# Patient Record
Sex: Female | Born: 1950 | Race: White | Hispanic: No | Marital: Married | Smoking: Former smoker
Health system: Southern US, Community
[De-identification: ages and names within clinical notes are randomized; demographics above are authoritative.]

## PROBLEM LIST (undated history)

## (undated) DIAGNOSIS — N3030 Trigonitis without hematuria: Secondary | ICD-10-CM

## (undated) DIAGNOSIS — E039 Hypothyroidism, unspecified: Secondary | ICD-10-CM

## (undated) DIAGNOSIS — I480 Paroxysmal atrial fibrillation: Secondary | ICD-10-CM

## (undated) DIAGNOSIS — K219 Gastro-esophageal reflux disease without esophagitis: Secondary | ICD-10-CM

## (undated) DIAGNOSIS — M545 Low back pain, unspecified: Secondary | ICD-10-CM

## (undated) DIAGNOSIS — Z8489 Family history of other specified conditions: Secondary | ICD-10-CM

## (undated) DIAGNOSIS — E785 Hyperlipidemia, unspecified: Secondary | ICD-10-CM

## (undated) DIAGNOSIS — M8580 Other specified disorders of bone density and structure, unspecified site: Secondary | ICD-10-CM

## (undated) DIAGNOSIS — E7800 Pure hypercholesterolemia, unspecified: Secondary | ICD-10-CM

## (undated) DIAGNOSIS — M1990 Unspecified osteoarthritis, unspecified site: Secondary | ICD-10-CM

## (undated) DIAGNOSIS — IMO0002 Reserved for concepts with insufficient information to code with codable children: Secondary | ICD-10-CM

## (undated) DIAGNOSIS — K449 Diaphragmatic hernia without obstruction or gangrene: Secondary | ICD-10-CM

## (undated) DIAGNOSIS — I700 Atherosclerosis of aorta: Secondary | ICD-10-CM

## (undated) DIAGNOSIS — G8929 Other chronic pain: Secondary | ICD-10-CM

## (undated) DIAGNOSIS — K222 Esophageal obstruction: Secondary | ICD-10-CM

## (undated) HISTORY — PX: BREAST CYST ASPIRATION: SHX578

## (undated) HISTORY — DX: Esophageal obstruction: K22.2

## (undated) HISTORY — PX: HYSTEROSCOPY: SHX211

## (undated) HISTORY — DX: Trigonitis without hematuria: N30.30

## (undated) HISTORY — DX: Atherosclerosis of aorta: I70.0

## (undated) HISTORY — DX: Other specified disorders of bone density and structure, unspecified site: M85.80

## (undated) HISTORY — DX: Paroxysmal atrial fibrillation: I48.0

## (undated) HISTORY — DX: Hyperlipidemia, unspecified: E78.5

## (undated) HISTORY — DX: Diaphragmatic hernia without obstruction or gangrene: K44.9

## (undated) HISTORY — DX: Gastro-esophageal reflux disease without esophagitis: K21.9

## (undated) HISTORY — DX: Reserved for concepts with insufficient information to code with codable children: IMO0002

## (undated) HISTORY — PX: COLONOSCOPY WITH ESOPHAGOGASTRODUODENOSCOPY (EGD): SHX5779

## (undated) HISTORY — PX: HERNIA REPAIR: SHX51

---

## 1970-06-26 HISTORY — PX: VARICOSE VEIN SURGERY: SHX832

## 1974-06-26 HISTORY — PX: THYROIDECTOMY: SHX17

## 1974-06-26 HISTORY — PX: TUBAL LIGATION: SHX77

## 1990-06-26 HISTORY — PX: EYE MUSCLE SURGERY: SHX370

## 1994-06-26 HISTORY — PX: DILATION AND CURETTAGE OF UTERUS: SHX78

## 1995-06-27 HISTORY — PX: BREAST BIOPSY: SHX20

## 1997-10-15 ENCOUNTER — Other Ambulatory Visit: Admission: RE | Admit: 1997-10-15 | Discharge: 1997-10-15 | Payer: Self-pay | Admitting: Gynecology

## 1997-12-09 ENCOUNTER — Ambulatory Visit (HOSPITAL_COMMUNITY): Admission: RE | Admit: 1997-12-09 | Discharge: 1997-12-09 | Payer: Self-pay | Admitting: Gynecology

## 1998-09-24 ENCOUNTER — Ambulatory Visit (HOSPITAL_COMMUNITY): Admission: RE | Admit: 1998-09-24 | Discharge: 1998-09-24 | Payer: Self-pay | Admitting: Gynecology

## 1998-09-24 ENCOUNTER — Encounter: Payer: Self-pay | Admitting: Gynecology

## 1998-10-18 ENCOUNTER — Other Ambulatory Visit: Admission: RE | Admit: 1998-10-18 | Discharge: 1998-10-18 | Payer: Self-pay | Admitting: Gynecology

## 1998-11-15 ENCOUNTER — Ambulatory Visit (HOSPITAL_BASED_OUTPATIENT_CLINIC_OR_DEPARTMENT_OTHER): Admission: RE | Admit: 1998-11-15 | Discharge: 1998-11-15 | Payer: Self-pay | Admitting: Surgery

## 1999-10-21 ENCOUNTER — Encounter: Payer: Self-pay | Admitting: Gynecology

## 1999-10-21 ENCOUNTER — Ambulatory Visit (HOSPITAL_COMMUNITY): Admission: RE | Admit: 1999-10-21 | Discharge: 1999-10-21 | Payer: Self-pay | Admitting: Gynecology

## 2000-03-02 ENCOUNTER — Other Ambulatory Visit: Admission: RE | Admit: 2000-03-02 | Discharge: 2000-03-02 | Payer: Self-pay | Admitting: Gynecology

## 2000-04-26 HISTORY — PX: OOPHORECTOMY: SHX86

## 2000-05-02 ENCOUNTER — Encounter (INDEPENDENT_AMBULATORY_CARE_PROVIDER_SITE_OTHER): Payer: Self-pay | Admitting: Specialist

## 2000-05-02 ENCOUNTER — Encounter (INDEPENDENT_AMBULATORY_CARE_PROVIDER_SITE_OTHER): Payer: Self-pay

## 2000-05-02 ENCOUNTER — Ambulatory Visit (HOSPITAL_COMMUNITY): Admission: RE | Admit: 2000-05-02 | Discharge: 2000-05-02 | Payer: Self-pay | Admitting: Gynecology

## 2000-11-02 ENCOUNTER — Encounter: Payer: Self-pay | Admitting: Gynecology

## 2000-11-02 ENCOUNTER — Ambulatory Visit (HOSPITAL_COMMUNITY): Admission: RE | Admit: 2000-11-02 | Discharge: 2000-11-02 | Payer: Self-pay | Admitting: Gynecology

## 2001-09-06 ENCOUNTER — Other Ambulatory Visit: Admission: RE | Admit: 2001-09-06 | Discharge: 2001-09-06 | Payer: Self-pay | Admitting: Gynecology

## 2001-12-13 ENCOUNTER — Ambulatory Visit (HOSPITAL_COMMUNITY): Admission: RE | Admit: 2001-12-13 | Discharge: 2001-12-13 | Payer: Self-pay | Admitting: Gynecology

## 2001-12-13 ENCOUNTER — Ambulatory Visit (HOSPITAL_COMMUNITY): Admission: RE | Admit: 2001-12-13 | Discharge: 2001-12-13 | Payer: Self-pay | Admitting: Obstetrics and Gynecology

## 2001-12-13 ENCOUNTER — Encounter: Payer: Self-pay | Admitting: Gynecology

## 2002-04-25 ENCOUNTER — Other Ambulatory Visit: Admission: RE | Admit: 2002-04-25 | Discharge: 2002-04-25 | Payer: Self-pay | Admitting: Gynecology

## 2002-10-10 ENCOUNTER — Other Ambulatory Visit: Admission: RE | Admit: 2002-10-10 | Discharge: 2002-10-10 | Payer: Self-pay | Admitting: Gynecology

## 2003-03-06 ENCOUNTER — Ambulatory Visit (HOSPITAL_COMMUNITY): Admission: RE | Admit: 2003-03-06 | Discharge: 2003-03-06 | Payer: Self-pay | Admitting: Gynecology

## 2003-03-06 ENCOUNTER — Encounter: Payer: Self-pay | Admitting: Gynecology

## 2004-02-19 ENCOUNTER — Other Ambulatory Visit: Admission: RE | Admit: 2004-02-19 | Discharge: 2004-02-19 | Payer: Self-pay | Admitting: Gynecology

## 2004-04-08 ENCOUNTER — Ambulatory Visit (HOSPITAL_COMMUNITY): Admission: RE | Admit: 2004-04-08 | Discharge: 2004-04-08 | Payer: Self-pay | Admitting: Gynecology

## 2004-04-15 ENCOUNTER — Encounter: Admission: RE | Admit: 2004-04-15 | Discharge: 2004-04-15 | Payer: Self-pay | Admitting: Gynecology

## 2005-02-23 ENCOUNTER — Other Ambulatory Visit: Admission: RE | Admit: 2005-02-23 | Discharge: 2005-02-23 | Payer: Self-pay | Admitting: Gynecology

## 2005-05-05 ENCOUNTER — Encounter: Admission: RE | Admit: 2005-05-05 | Discharge: 2005-05-05 | Payer: Self-pay | Admitting: Gynecology

## 2005-05-15 ENCOUNTER — Encounter: Admission: RE | Admit: 2005-05-15 | Discharge: 2005-05-15 | Payer: Self-pay | Admitting: Gynecology

## 2006-03-23 ENCOUNTER — Other Ambulatory Visit: Admission: RE | Admit: 2006-03-23 | Discharge: 2006-03-23 | Payer: Self-pay | Admitting: Gynecology

## 2006-04-19 ENCOUNTER — Encounter: Payer: Self-pay | Admitting: Family Medicine

## 2006-05-11 ENCOUNTER — Encounter: Admission: RE | Admit: 2006-05-11 | Discharge: 2006-05-11 | Payer: Self-pay | Admitting: Gynecology

## 2006-05-22 ENCOUNTER — Encounter: Admission: RE | Admit: 2006-05-22 | Discharge: 2006-05-22 | Payer: Self-pay | Admitting: Gynecology

## 2006-07-11 ENCOUNTER — Encounter: Payer: Self-pay | Admitting: Family Medicine

## 2006-11-30 ENCOUNTER — Encounter: Admission: RE | Admit: 2006-11-30 | Discharge: 2006-11-30 | Payer: Self-pay | Admitting: Gynecology

## 2007-03-29 ENCOUNTER — Other Ambulatory Visit: Admission: RE | Admit: 2007-03-29 | Discharge: 2007-03-29 | Payer: Self-pay | Admitting: Gynecology

## 2007-10-24 ENCOUNTER — Encounter: Admission: RE | Admit: 2007-10-24 | Discharge: 2007-10-24 | Payer: Self-pay | Admitting: Gynecology

## 2007-12-25 LAB — CONVERTED CEMR LAB
Pap Smear: NORMAL
Pap Smear: NORMAL

## 2008-02-27 ENCOUNTER — Ambulatory Visit: Payer: Self-pay | Admitting: Gynecology

## 2008-04-03 ENCOUNTER — Encounter: Payer: Self-pay | Admitting: Gynecology

## 2008-04-03 ENCOUNTER — Other Ambulatory Visit: Admission: RE | Admit: 2008-04-03 | Discharge: 2008-04-03 | Payer: Self-pay | Admitting: Gynecology

## 2008-04-03 ENCOUNTER — Ambulatory Visit: Payer: Self-pay | Admitting: Gynecology

## 2008-04-17 ENCOUNTER — Ambulatory Visit: Payer: Self-pay | Admitting: Gynecology

## 2008-09-22 ENCOUNTER — Ambulatory Visit: Payer: Self-pay | Admitting: Family Medicine

## 2008-09-22 DIAGNOSIS — E039 Hypothyroidism, unspecified: Secondary | ICD-10-CM | POA: Insufficient documentation

## 2008-09-22 DIAGNOSIS — J309 Allergic rhinitis, unspecified: Secondary | ICD-10-CM | POA: Insufficient documentation

## 2008-09-22 DIAGNOSIS — E785 Hyperlipidemia, unspecified: Secondary | ICD-10-CM | POA: Insufficient documentation

## 2008-10-02 ENCOUNTER — Encounter: Payer: Self-pay | Admitting: Family Medicine

## 2008-10-05 LAB — CONVERTED CEMR LAB
ALT: 16 units/L (ref 0–35)
AST: 19 units/L (ref 0–37)
Albumin: 4.6 g/dL (ref 3.5–5.2)
Alkaline Phosphatase: 75 units/L (ref 39–117)
BUN: 8 mg/dL (ref 6–23)
CO2: 27 meq/L (ref 19–32)
Calcium: 9.8 mg/dL (ref 8.4–10.5)
Chloride: 107 meq/L (ref 96–112)
Cholesterol: 165 mg/dL (ref 0–200)
Creatinine, Ser: 0.93 mg/dL (ref 0.40–1.20)
Glucose, Bld: 100 mg/dL — ABNORMAL HIGH (ref 70–99)
HCT: 42.7 % (ref 36.0–46.0)
HDL: 44 mg/dL (ref >39)
Hemoglobin: 14 g/dL (ref 12.0–15.0)
LDL Cholesterol: 93 mg/dL (ref 0–99)
MCHC: 32.8 g/dL (ref 30.0–36.0)
MCV: 84.1 fL (ref 78.0–100.0)
Platelets: 197 10*3/uL (ref 150–400)
Potassium: 4.7 meq/L (ref 3.5–5.3)
RBC: 5.08 M/uL (ref 3.87–5.11)
RDW: 13.4 % (ref 11.5–15.5)
Sodium: 144 meq/L (ref 135–145)
Total Bilirubin: 0.7 mg/dL (ref 0.3–1.2)
Total CHOL/HDL Ratio: 3.8
Total Protein: 7.1 g/dL (ref 6.0–8.3)
Triglycerides: 138 mg/dL (ref <150)
VLDL: 28 mg/dL (ref 0–40)
WBC: 5.3 10*3/uL (ref 4.0–10.5)

## 2008-10-28 ENCOUNTER — Encounter: Admission: RE | Admit: 2008-10-28 | Discharge: 2008-10-28 | Payer: Self-pay | Admitting: Gynecology

## 2008-11-03 ENCOUNTER — Encounter: Payer: Self-pay | Admitting: Family Medicine

## 2008-11-10 ENCOUNTER — Ambulatory Visit: Payer: Self-pay | Admitting: Family Medicine

## 2008-11-10 DIAGNOSIS — R002 Palpitations: Secondary | ICD-10-CM | POA: Insufficient documentation

## 2008-11-10 DIAGNOSIS — R5383 Other fatigue: Secondary | ICD-10-CM

## 2008-11-10 DIAGNOSIS — R5381 Other malaise: Secondary | ICD-10-CM | POA: Insufficient documentation

## 2008-11-11 ENCOUNTER — Encounter: Payer: Self-pay | Admitting: Family Medicine

## 2008-11-12 LAB — CONVERTED CEMR LAB
TSH: 2.3 microintl units/mL (ref 0.350–4.500)
Vit D, 25-Hydroxy: 36 ng/mL (ref 30–89)
Vitamin B-12: 357 pg/mL (ref 211–911)

## 2008-11-19 ENCOUNTER — Telehealth (INDEPENDENT_AMBULATORY_CARE_PROVIDER_SITE_OTHER): Payer: Self-pay

## 2008-11-24 ENCOUNTER — Encounter: Payer: Self-pay | Admitting: Family Medicine

## 2008-11-24 ENCOUNTER — Ambulatory Visit: Payer: Self-pay

## 2008-12-07 ENCOUNTER — Telehealth: Payer: Self-pay | Admitting: Family Medicine

## 2008-12-10 ENCOUNTER — Telehealth (INDEPENDENT_AMBULATORY_CARE_PROVIDER_SITE_OTHER): Payer: Self-pay | Admitting: *Deleted

## 2008-12-16 ENCOUNTER — Ambulatory Visit: Payer: Self-pay | Admitting: Family Medicine

## 2008-12-16 DIAGNOSIS — J392 Other diseases of pharynx: Secondary | ICD-10-CM | POA: Insufficient documentation

## 2009-01-21 ENCOUNTER — Telehealth: Payer: Self-pay | Admitting: Family Medicine

## 2009-02-25 ENCOUNTER — Ambulatory Visit: Payer: Self-pay | Admitting: Family Medicine

## 2009-02-25 LAB — CONVERTED CEMR LAB
Bilirubin Urine: NEGATIVE
Blood in Urine, dipstick: NEGATIVE
Glucose, Urine, Semiquant: NEGATIVE
Ketones, urine, test strip: NEGATIVE
Nitrite: NEGATIVE
Protein, U semiquant: NEGATIVE
Specific Gravity, Urine: 1.015
Urobilinogen, UA: 0.2
WBC Urine, dipstick: NEGATIVE
pH: 5.5

## 2009-02-26 ENCOUNTER — Encounter: Payer: Self-pay | Admitting: Family Medicine

## 2009-04-08 ENCOUNTER — Telehealth (INDEPENDENT_AMBULATORY_CARE_PROVIDER_SITE_OTHER): Payer: Self-pay | Admitting: *Deleted

## 2009-04-09 ENCOUNTER — Other Ambulatory Visit: Admission: RE | Admit: 2009-04-09 | Discharge: 2009-04-09 | Payer: Self-pay | Admitting: Gynecology

## 2009-04-09 ENCOUNTER — Encounter: Payer: Self-pay | Admitting: Gynecology

## 2009-04-09 ENCOUNTER — Ambulatory Visit: Payer: Self-pay | Admitting: Gynecology

## 2009-04-16 ENCOUNTER — Ambulatory Visit: Payer: Self-pay | Admitting: Family Medicine

## 2009-05-07 ENCOUNTER — Ambulatory Visit: Payer: Self-pay | Admitting: Family Medicine

## 2009-05-13 ENCOUNTER — Telehealth: Payer: Self-pay | Admitting: Family Medicine

## 2009-06-02 ENCOUNTER — Encounter: Payer: Self-pay | Admitting: Family Medicine

## 2009-06-26 HISTORY — PX: CATARACT EXTRACTION W/ INTRAOCULAR LENS IMPLANT: SHX1309

## 2009-07-14 ENCOUNTER — Encounter: Payer: Self-pay | Admitting: Family Medicine

## 2009-07-16 ENCOUNTER — Ambulatory Visit: Payer: Self-pay | Admitting: Gynecology

## 2009-07-21 ENCOUNTER — Encounter: Payer: Self-pay | Admitting: Family Medicine

## 2009-07-30 ENCOUNTER — Ambulatory Visit: Payer: Self-pay | Admitting: Family Medicine

## 2009-09-01 ENCOUNTER — Ambulatory Visit: Payer: Self-pay | Admitting: Gynecology

## 2009-11-12 ENCOUNTER — Encounter: Admission: RE | Admit: 2009-11-12 | Discharge: 2009-11-12 | Payer: Self-pay | Admitting: Gynecology

## 2009-12-08 ENCOUNTER — Ambulatory Visit: Payer: Self-pay | Admitting: Gynecology

## 2009-12-23 ENCOUNTER — Telehealth: Payer: Self-pay | Admitting: Family Medicine

## 2009-12-23 DIAGNOSIS — Z91040 Latex allergy status: Secondary | ICD-10-CM | POA: Insufficient documentation

## 2009-12-31 ENCOUNTER — Encounter: Payer: Self-pay | Admitting: Family Medicine

## 2009-12-31 LAB — CONVERTED CEMR LAB
ALT: 17 units/L (ref 0–35)
AST: 18 units/L (ref 0–37)
Albumin: 4.2 g/dL (ref 3.5–5.2)
Alkaline Phosphatase: 60 units/L (ref 39–117)
BUN: 10 mg/dL (ref 6–23)
CO2: 27 meq/L (ref 19–32)
Calcium: 9.5 mg/dL (ref 8.4–10.5)
Chloride: 106 meq/L (ref 96–112)
Cholesterol: 159 mg/dL (ref 0–200)
Creatinine, Ser: 0.89 mg/dL (ref 0.40–1.20)
Glucose, Bld: 94 mg/dL (ref 70–99)
HDL: 47 mg/dL (ref >39)
LDL Cholesterol: 87 mg/dL (ref 0–99)
Potassium: 4.7 meq/L (ref 3.5–5.3)
Sodium: 142 meq/L (ref 135–145)
TSH: 2.291 microintl units/mL (ref 0.350–4.500)
Total Bilirubin: 0.7 mg/dL (ref 0.3–1.2)
Total CHOL/HDL Ratio: 3.4
Total Protein: 7 g/dL (ref 6.0–8.3)
Triglycerides: 123 mg/dL (ref <150)
VLDL: 25 mg/dL (ref 0–40)

## 2010-01-21 ENCOUNTER — Ambulatory Visit: Payer: Self-pay | Admitting: Family Medicine

## 2010-04-01 ENCOUNTER — Ambulatory Visit: Payer: Self-pay | Admitting: Family Medicine

## 2010-04-01 DIAGNOSIS — J019 Acute sinusitis, unspecified: Secondary | ICD-10-CM | POA: Insufficient documentation

## 2010-04-07 ENCOUNTER — Telehealth: Payer: Self-pay | Admitting: Family Medicine

## 2010-04-15 ENCOUNTER — Ambulatory Visit: Payer: Self-pay | Admitting: Gynecology

## 2010-04-15 ENCOUNTER — Other Ambulatory Visit: Admission: RE | Admit: 2010-04-15 | Discharge: 2010-04-15 | Payer: Self-pay | Admitting: Gynecology

## 2010-04-20 ENCOUNTER — Telehealth: Payer: Self-pay | Admitting: Family Medicine

## 2010-04-28 ENCOUNTER — Encounter: Payer: Self-pay | Admitting: Family Medicine

## 2010-06-10 ENCOUNTER — Ambulatory Visit: Payer: Self-pay | Admitting: Family Medicine

## 2010-06-10 ENCOUNTER — Telehealth: Payer: Self-pay | Admitting: Family Medicine

## 2010-06-15 ENCOUNTER — Encounter: Payer: Self-pay | Admitting: Family Medicine

## 2010-07-16 ENCOUNTER — Encounter: Payer: Self-pay | Admitting: Gynecology

## 2010-07-17 ENCOUNTER — Encounter: Payer: Self-pay | Admitting: Gynecology

## 2010-07-18 ENCOUNTER — Encounter: Payer: Self-pay | Admitting: Gynecology

## 2010-07-24 LAB — CONVERTED CEMR LAB
Cholesterol, target level: 200 mg/dL
HDL goal, serum: 40 mg/dL
LDL Goal: 130 mg/dL

## 2010-07-28 NOTE — Assessment & Plan Note (Signed)
Summary: SINUS PROBLEMS   Vital Signs:  Patient profile:   60 year old female Height:      65 inches Weight:      165 pounds Temp:     98.1 degrees F BP sitting:   143 / 85  (right arm)  Vitals Entered By: Charolett Bumpers (July 30, 2009 1:56 PM)  Primary Care Provider:  Nani Gasser MD  CC:  Sinus pain.  History of Present Illness: Feels her sinuses really haven't improved.  Not sure the astelin is really helping. Feels it may be drying her out too much.  Stil on the rhinocort. Saw Dr. Patterson Hammersmith at [facility] a few years ago.  He felt it had to do with sleeping with her mouth open. Also spoke to her dentist as well.  Used to use netti-pot.  Wants to know if can use her rhinicort two times a day.   Allergies: 1)  ! Codeine 2)  ! Rocephin 3)  ! Augmentin  Past History:  Past Medical History: Last updated: 02/25/2009 Dr. Brett Fairy (ortho)- herniated disk in neck and low back Dr. 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 Baxter Kail - Gen sx Dr. Judi Saa - Endocrone  Hx of hyperthyroid s/p thyroidectomy Upper GI 07-11-2006- Schatzkis ring and hiatal hernia  Physical Exam  General:  Well-developed,well-nourished,in no acute distress; alert,appropriate and cooperative throughout examination Head:  Normocephalic and atraumatic without obvious abnormalities. No apparent alopecia or balding. Eyes:  No corneal or conjunctival inflammation noted. EOMI. Perrla.  Ears:  External ear exam shows no significant lesions or deformities.  Otoscopic examination reveals clear canals, tympanic membranes are intact bilaterally without bulging, retraction, inflammation or discharge. Hearing is grossly normal bilaterally. Nose:  External nasal examination shows no deformity or inflammation. Nasal mucosa are pink and moist without lesions or exudates. Mouth:  Oral mucosa and oropharynx without lesions or exudates.  Teeth in good repair. Neck:  No deformities, masses, or  tenderness noted. Lungs:  Normal respiratory effort, chest expands symmetrically. Lungs are clear to auscultation, no crackles or wheezes. Heart:  Normal rate and regular rhythm. S1 and S2 normal without gallop, murmur, click, rub or other extra sounds. Skin:  no rashes.   Psych:  Cognition and judgment appear intact. Alert and cooperative with normal attention span and concentration. 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 Give is 10-14 days. If not better then please let me know and willr efer back to ENT. Can either see Dr. Patterson Hammersmith or consider referral to [facility].  Also it may be realted to her allergies and may benefit from allergy testing.    Complete Medication List: 1)  Synthroid 75 Mcg Tabs (Levothyroxine sodium) .[name].. 1 by mouth once daily 2)  Simvastatin 40 Mg Tabs (Simvastatin) .... Take 1/2 tablet by mouth every day 3)  Meloxicam 15 Mg Tabs (Meloxicam) .... Take one tablet by mouth once ad ay 4)  Vicodin 5-500 Mg Tabs (Hydrocodone-acetaminophen) .... Take one tablet by mouth daily as needed 5)  Vitamin D 2000 Unit Tabs (Cholecalciferol) .... Take one tablet by mouth once a day 6)  Calcium 600/vitamin D 600-400 Mg-unit Tabs (Calcium carbonate-vitamin d) .... Take one tablet by mouth once a day 7)  Multivitamins Tabs (Multiple vitamin) .... Take one tablet by mouth once  a day 8)  Metoprolol Tartrate 25 Mg Tabs (Metoprolol tartrate) .... Take 1 tablet by mouth once a day 9)  Pantoprazole Sodium 40 Mg Tbec (Pantoprazole sodium) .... Take 1 tablet by mouth once a day 10)  Rhinocort Aqua 32 Mcg/act Susp (Budesonide) .... 2 sprays in each nostril twice a day 11)  Astelin 137 Mcg/spray Soln (Azelastine hcl) .[name].. 1 spray each nostril twice daily Prescriptions: RHINOCORT AQUA 32 MCG/ACT SUSP (BUDESONIDE) 2 sprays in  each nostril twice a day  #30 days sup x 4   Entered and Authorized by:   Nani Gasser MD   Signed by:   Nani Gasser MD on 07/30/2009   Method used:   Print then Give to Patient   RxID:   [id number]

## 2010-07-28 NOTE — Letter (Signed)
Summary: [facility]  [facility]   Imported By: Lanelle Bal 05/17/2010 11:47:48  _____________________________________________________________________  External Attachment:    Type:   Image     Comment:   External Document

## 2010-07-28 NOTE — Progress Notes (Signed)
Summary: ? Latex allergy  Phone Note Call from Patient   Caller: [phone] Summary of Call: Pt states she went for cataract surgery but was unable to proceed bc she may have a latex allergy. Surgeon needs Pt to have allergy testing done to see if she is truely allergic. Pt would like to know how she can be tested. Please advise. 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 Latex allergy Pt aware

## 2010-07-28 NOTE — Assessment & Plan Note (Signed)
Summary: sinusitis   Vital Signs:  Patient profile:   60 year old female Height:      65 inches Weight:      166 pounds Temp:     98.7 degrees F Pulse rate:   86 / minute BP sitting:   122 / 69  (right arm) Cuff size:   regular  Vitals Entered By: Avon Gully CMA, Duncan Dull) (April 01, 2010 1:09 PM) CC: sinus pressure since yesterday,coughing up green mucus   Primary Care Provider:  Nani Gasser MD  CC:  sinus pressure since yesterday and coughing up green mucus.  History of Present Illness: sinus pressure since yesterday,coughing up green mucus. Eyes felt sore. + HA.  NO GI sxs.  No ST.  Nasal congestions. + post nasal drainage. Sputum is yello.  Taking mucinex for sinus. No known sick contacts.   Current Medications (verified): 1)  Synthroid 75 Mcg Tabs (Levothyroxine Sodium) .[name].. 1 By Mouth Once Daily 2)  Simvastatin 40 Mg Tabs (Simvastatin) .... Take 1/2 Tablet By Mouth Every Day 3)  Meloxicam 15 Mg Tabs (Meloxicam) .... Take One Tablet By Mouth Once Ad Ay 4)  Vicodin 5-500 Mg Tabs (Hydrocodone-Acetaminophen) .... Take One Tablet By Mouth Daily As Needed 5)  Vitamin D 2000 Unit Tabs (Cholecalciferol) .... Take One Tablet By Mouth Once A Day 6)  Calcium 600/vitamin D 600-400 Mg-Unit Tabs (Calcium Carbonate-Vitamin D) .... Take One Tablet By Mouth Once A Day 7)  Multivitamins  Tabs (Multiple Vitamin) .... Take One Tablet By Mouth Once A Day 8)  Metoprolol Tartrate 25 Mg Tabs (Metoprolol Tartrate) .... Take 1 Tablet By Mouth Once A Day 9)  Pantoprazole Sodium 40 Mg Tbec (Pantoprazole Sodium) .... Take 1 Tablet By Mouth Once A Day 10)  Rhinocort Aqua 32 Mcg/act Susp (Budesonide) .... 2 Sprays in Each Nostril Twice A Day  Allergies (verified): 1)  ! Codeine 2)  ! Rocephin 3)  ! Augmentin  Comments:  Nurse/Medical Assistant: The patient's medications and allergies were reviewed with the patient and were updated in the Medication and Allergy Lists. Avon Gully  CMA, Duncan Dull) (April 01, 2010 1:10 PM)  Physical Exam  General:  Well-developed,well-nourished,in no acute distress; alert,appropriate and cooperative throughout examination Head:  Normocephalic and atraumatic without obvious abnormalities. No apparent alopecia or balding. Eyes:  No corneal or conjunctival inflammation noted. EOMI. Perrla.  Ears:  External ear exam shows no significant lesions or deformities.  Otoscopic examination reveals clear canals, tympanic membranes are intact bilaterally without bulging, retraction, inflammation or discharge. Hearing is grossly normal bilaterally. Nose:  External nasal examination shows no deformity or inflammation. Nasal mucosa are pink and moist without lesions or exudates. Mouth:  Oral mucosa and oropharynx without lesions or exudates.  Teeth in good repair. Neck:  No deformities, masses, or tenderness noted. Lungs:  Normal respiratory effort, chest expands symmetrically. Lungs are clear to auscultation, no crackles or wheezes. Heart:  Normal rate and regular rhythm. S1 and S2 normal without gallop, murmur, click, rub or other extra sounds. Skin:  no rashes.   Cervical Nodes:  No lymphadenopathy noted Psych:  Cognition and judgment appear intact. Alert and cooperative with normal attention span and concentration. No apparent delusions, illusions, hallucinations   Impression & Recommendations:  Problem # 1:  SINUSITIS - ACUTE-NOS (ICD-461.9) Call if not better in one week and if not better then will call in an antibiotic. Can use OT meds and can continue the nasal steroid.  Her updated medication list  for this problem includes:    Rhinocort Aqua 32 Mcg/act Susp (Budesonide) .[name]... 2 sprays in each nostril twice a day  Instructed on treatment. Call if symptoms persist or worsen.   Complete Medication List: 1)  Synthroid 75 Mcg Tabs (Levothyroxine sodium) .[name].. 1 by mouth once daily 2)  Simvastatin 40 Mg Tabs (Simvastatin) .... Take 1/2 tablet by  mouth every day 3)  Meloxicam 15 Mg Tabs (Meloxicam) .... Take one tablet by mouth once ad ay 4)  Vicodin 5-500 Mg Tabs (Hydrocodone-acetaminophen) .... Take one tablet by mouth daily as needed 5)  Vitamin D 2000 Unit Tabs (Cholecalciferol) .... Take one tablet by mouth once a day 6)  Calcium 600/vitamin D 600-400 Mg-unit Tabs (Calcium carbonate-vitamin d) .... Take one tablet by mouth once a day 7)  Multivitamins Tabs (Multiple vitamin) .... Take one tablet by mouth once a day 8)  Metoprolol Tartrate 25 Mg Tabs (Metoprolol tartrate) .... Take 1 tablet by mouth once a day 9)  Pantoprazole Sodium 40 Mg Tbec (Pantoprazole sodium) .... Take 1 tablet by mouth once a day 10)  Rhinocort Aqua 32 Mcg/act Susp (Budesonide) .... 2 sprays in each nostril twice a day  Patient Instructions: 1)  Call if not better in one week.  2)  For now can use the over the counter products for your cold.

## 2010-07-28 NOTE — Letter (Signed)
Summary: [facility] [place]   Imported By: Lanelle Bal 07/04/2010 12:42:28  _____________________________________________________________________  External Attachment:    Type:   Image     Comment:   External Document

## 2010-07-28 NOTE — Progress Notes (Signed)
Summary: Sinus no better  Phone Note Call from Patient Call back at Home Phone [phone]   Caller: Patient Call For: Anne Gasser MD Summary of Call: Seen last week for sinus problems. Continues to have green mucous, drainage, H/a. Told to call back if was not better. [facility]ses [facility] Initial call taken by: Kathlene November LPN,  April 07, 2010 8:29 AM  Follow-up for Phone Call        Rx Called In Follow-up by: Anne Gasser MD,  April 07, 2010 10:43 AM  Additional Follow-up for Phone Call Additional follow up Details #1::        Pt notified Additional Follow-up by: Kathlene November LPN,  April 07, 2010 10:53 AM    New/Updated Medications: ZITHROMAX Z-PAK 250 MG TABS (AZITHROMYCIN) Take as directed. Prescriptions: ZITHROMAX Z-PAK 250 MG TABS (AZITHROMYCIN) Take as directed.  #1 pack x 0   Entered and Authorized by:   Anne Gasser MD   Signed by:   Anne Gasser MD on 04/07/2010   Method used:   [facility] to        [facility] [phone]* (retail)       [address]       [place], [place]  [address]       Ph: [phone] or [phone]       Fax: [phone]   RxID:   [id number]

## 2010-07-28 NOTE — Assessment & Plan Note (Signed)
Summary: CPE   Vital Signs:  Patient profile:   60 year old female Height:      65 inches Weight:      165 pounds BMI:     27.56 Pulse rate:   76 / minute BP sitting:   119 / 70  (left arm) Cuff size:   regular  Vitals Entered By: Avon Gully CMA, Duncan Dull) (January 21, 2010 1:52 PM) CC: CPE, no pap    Primary Care Provider:  Nani Gasser MD  CC:  CPE and no pap .  History of Present Illness: Has her GYN appt this fall.  REcenlty dx wthi cataracts.  Had to Latex allergy testing before they would do the surgery.  She tested negative. Following up wiht endocrine in September.   Current Medications (verified): 1)  Synthroid 75 Mcg Tabs (Levothyroxine Sodium) .[name].. 1 By Mouth Once Daily 2)  Simvastatin 40 Mg Tabs (Simvastatin) .... Take 1/2 Tablet By Mouth Every Day 3)  Meloxicam 15 Mg Tabs (Meloxicam) .... Take One Tablet By Mouth Once Ad Ay 4)  Vicodin 5-500 Mg Tabs (Hydrocodone-Acetaminophen) .... Take One Tablet By Mouth Daily As Needed 5)  Vitamin D 2000 Unit Tabs (Cholecalciferol) .... Take One Tablet By Mouth Once A Day 6)  Calcium 600/vitamin D 600-400 Mg-Unit Tabs (Calcium Carbonate-Vitamin D) .... Take One Tablet By Mouth Once A Day 7)  Multivitamins  Tabs (Multiple Vitamin) .... Take One Tablet By Mouth Once A Day 8)  Metoprolol Tartrate 25 Mg Tabs (Metoprolol Tartrate) .... Take 1 Tablet By Mouth Once A Day 9)  Pantoprazole Sodium 40 Mg Tbec (Pantoprazole Sodium) .... Take 1 Tablet By Mouth Once A Day 10)  Rhinocort Aqua 32 Mcg/act Susp (Budesonide) .... 2 Sprays in Each Nostril Twice A Day  Allergies (verified): 1)  ! Codeine 2)  ! Rocephin 3)  ! Augmentin  Comments:  Nurse/Medical Assistant: The patient's medications and allergies were reviewed with the patient and were updated in the Medication and Allergy Lists. Avon Gully CMA, Duncan Dull) (January 21, 2010 1:53 PM)  Past History:  Past Surgical History: Last updated: 09/22/2008 Breast tissue bx 1997 -  benign Vricose vein 1972 Thyroidectomy  1976 Tubal ligation 1976 D&C 1996  Family History: Last updated: 09/22/2008 Sister withe stroke, SLE, HTN, RA Brother with hi chol.  Sister with BrCA at 56 yo? Daughter with RA.   FAther with MI in his early 63s.   Social History: Last updated: 09/22/2008 GED.  Married to [place].   Former Smoker Alcohol use-no Drug use-no Regular exercise-no  Past Medical History: Dr. Brett Fairy (ortho)- herniated disk in neck and low back Dr. 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 Baxter Kail - Gen sx Dr. Judi Saa - Endocrone  Hx of hyperthyroid s/p thyroidectomy Upper GI 07-11-2006- Schatzkis ring and hiatal hernia Dr. Audie Box - GYN.   Physical Exam  General:  Well-developed,well-nourished,in no acute distress; alert,appropriate and cooperative throughout examination Head:  Normocephalic and atraumatic without obvious abnormalities. No apparent alopecia or balding. Eyes:  No corneal or conjunctival inflammation noted. EOMI. Perrla.  Ears:  External ear exam shows no significant lesions or deformities.  Otoscopic examination reveals clear canals, tympanic membranes are intact bilaterally without bulging, retraction, inflammation or discharge. Hearing is grossly normal bilaterally. Nose:  External nasal examination shows no deformity or inflammation. Nasal mucosa are pink and moist without lesions or exudates. Mouth:  Oral mucosa and oropharynx without lesions or exudates.  Teeth in good repair.  Neck:  No deformities, masses, or tenderness noted. Chest Wall:  No deformities, masses, or tenderness noted. Lungs:  Normal respiratory effort, chest expands symmetrically. Lungs are clear to auscultation, no crackles or wheezes. Heart:  Normal rate and regular rhythm. S1 and S2 normal without gallop, murmur, click, rub or other extra sounds. Abdomen:  Bowel sounds positive,abdomen soft and non-tender without masses, organomegaly or  hernias noted. Msk:  No deformity or scoliosis noted of thoracic or lumbar spine.   Pulses:  R and L carotid,radial,dorsalis pedis and posterior tibial pulses are full and equal bilaterally Extremities:  No clubbing, cyanosis, edema, or deformity noted with normal full range of motion of all joints.   Neurologic:  No cranial nerve deficits noted. Station and gait are normal.DTRs are symmetrical throughout. Sensory, motor and coordinative functions appear intact. Skin:  no rashes.  no rashes.   Cervical Nodes:  No lymphadenopathy noted Psych:  Cognition and judgment appear intact. Alert and cooperative with normal attention span and concentration. No apparent delusions, illusions, hallucinations   Impression & Recommendations:  Problem # 1:  HEALTH SCREENING (ICD-V70.0) Doing well overall.  Encouage regular exercise.  Consider silver sneakers.   Tdap.  She will check wiht insurance to see if cover the  zostavax.  Reviewed her labwork with her. Unfortunately we did not obtain a vitamin D.  She has appt wiht her endocrine and GYN this fall.   Complete Medication List: 1)  Synthroid 75 Mcg Tabs (Levothyroxine sodium) .[name].. 1 by mouth once daily 2)  Simvastatin 40 Mg Tabs (Simvastatin) .... Take 1/2 tablet by mouth every day 3)  Meloxicam 15 Mg Tabs (Meloxicam) .... Take one tablet by mouth once ad ay 4)  Vicodin 5-500 Mg Tabs (Hydrocodone-acetaminophen) .... Take one tablet by mouth daily as needed 5)  Vitamin D 2000 Unit Tabs (Cholecalciferol) .... Take one tablet by mouth once a day 6)  Calcium 600/vitamin D 600-400 Mg-unit Tabs (Calcium carbonate-vitamin d) .... Take one tablet by mouth once a day 7)  Multivitamins Tabs (Multiple vitamin) .... Take one tablet by mouth once a day 8)  Metoprolol Tartrate 25 Mg Tabs (Metoprolol tartrate) .... Take 1 tablet by mouth once a day 9)  Pantoprazole Sodium 40 Mg Tbec (Pantoprazole sodium) .... Take 1 tablet by mouth once a day 10)  Rhinocort Aqua 32  Mcg/act Susp (Budesonide) .... 2 sprays in each nostril twice a day  Other Orders: Tdap => 50yrs IM (09811) Admin 1st Vaccine (91478)  Patient Instructions: 1)  Call when you are rady for the ENT referral for throat sticking sensation.  2)  It is important that you exercise reguarly at least 20 minutes 5 times a week. If you develop chest pain, have severe difficulty breathing, or feel very tired, stop exercising immediately and seek medical attention.  3)  Take calcium +vitamin D daily.  4)  Good luck with your cataract surgery.   Last Mammogram:  normal (10/28/2008 10:34:32 AM) Mammogram Result Date:  10/24/2009 Mammogram Result:  normal Mammogram Next Due:  1 yr     Immunizations Administered:  Tetanus Vaccine:    Vaccine Type: Tdap    Site: left deltoid    Mfr: [employer]oSmith[employer]    Dose: 0.5 ml    Route: IM    Given by: Sue Lush McCrimmon CMA, (AAMA)    Exp. Date: 12/24/2011    Lot #: [id number]    VIS given: 05/14/07 version given January 21, 2010.

## 2010-07-28 NOTE — Progress Notes (Signed)
Summary: ENT referral  Phone Note Call from Patient Call back at [phone]   Caller: Patient Call For: Nani Gasser MD Summary of Call: Pt came by office and wants to get a ENT referral in [place]. Said you told her once decided she wanted to see specialists just to let you know and we would schedule. Initial call taken by: Kathlene November LPN,  April 20, 2010 3:31 PM  Follow-up for Phone Call        +done Follow-up by: Nani Gasser MD,  April 20, 2010 8:44 PM

## 2010-07-28 NOTE — Progress Notes (Signed)
Summary: N/V/D  Phone Note Call from Patient   Caller: Patient Summary of Call: Pt states she is having N/V/D  and fever of 100.4 x 1 day. Pt denies any pain or other Sxs. I advised Pt to stay hydrated, use tylenol/advil for body aches and fever. Can you send Rx for nausea?  Please advise. Initial call taken by: Payton Spark CMA,  June 10, 2010 1:15 PM  Follow-up for Phone Call        yes, I will send her meds for nausea and she can use OTC Immodium for diarrhea.  Sip on water or diluted gatorade to prevent dehydration and start bland diet once feeling better. Expect improvement within 72 hrs.  Call if not improving or go to UC this weekend if getting worse. Follow-up by: Seymour Bars DO,  June 10, 2010 1:20 PM    New/Updated Medications: ZOFRAN ODT 8 MG TBDP (ONDANSETRON) 1 tab dissolved under tonge three times a day as needed nausea Prescriptions: ZOFRAN ODT 8 MG TBDP (ONDANSETRON) 1 tab dissolved under tonge three times a day as needed nausea  #15 x 0   Entered and Authorized by:   Seymour Bars DO   Signed by:   Seymour Bars DO on 06/10/2010   Method used:   [facility]        [facility] [phone]* (retail)       [address]       [place], [place]  [address]       Ph: [phone] or [phone]       Fax: [phone]   RxID:   [id number]   Appended Document: N/V/D Pt aware of the above.

## 2010-07-29 NOTE — Medication Information (Signed)
Summary: Possible Nonadherence with Simvastatin/[facility]  Possible Nonadherence with Simvastatin/[facility]   Imported By: Lanelle Bal 07/29/2009 10:21:14  _____________________________________________________________________  External Attachment:    Type:   Image     Comment:   External Document

## 2010-08-02 ENCOUNTER — Encounter: Payer: Self-pay | Admitting: Family Medicine

## 2010-09-12 ENCOUNTER — Telehealth: Payer: Self-pay | Admitting: Family Medicine

## 2010-09-22 NOTE — Progress Notes (Signed)
  Phone Note Refill Request Message from:  Fax from Pharmacy on September 12, 2010 4:59 PM  Refills Requested: Medication #1:  MELOXICAM 15 MG TABS Take one tablet by mouth once ad ay Initial call taken by: Avon Gully CMA, Duncan Dull),  September 12, 2010 4:59 PM    Prescriptions: MELOXICAM 15 MG TABS (MELOXICAM) Take one tablet by mouth once ad ay  #30 Tablet x 2   Entered by:   Avon Gully CMA, (AAMA)   Authorized by:   Nani Gasser MD   Signed by:   Avon Gully CMA, (AAMA) on 09/12/2010   Method used:   [facility]ronically to        [facility] [phone]* (retail)       [address] [place], [place]  [address]       Ph: [phone] or [phone]       Fax: [phone]   RxID:   [id number]

## 2010-09-28 ENCOUNTER — Telehealth: Payer: Self-pay | Admitting: Family Medicine

## 2010-09-28 ENCOUNTER — Other Ambulatory Visit: Payer: Self-pay | Admitting: Family Medicine

## 2010-09-28 NOTE — Telephone Encounter (Signed)
Pls send in 90 day Rx for Prilosec to [facility] [address], pt wants to discontinue Pantroprazole

## 2010-09-29 MED ORDER — OMEPRAZOLE 40 MG PO CPDR: 40.0000 mg | DELAYED_RELEASE_CAPSULE | Freq: Every day | ORAL | Status: DC

## 2010-10-11 ENCOUNTER — Other Ambulatory Visit: Payer: Self-pay | Admitting: Gynecology

## 2010-10-11 DIAGNOSIS — Z139 Encounter for screening, unspecified: Secondary | ICD-10-CM

## 2010-11-11 ENCOUNTER — Other Ambulatory Visit: Payer: Self-pay | Admitting: Family Medicine

## 2010-11-11 NOTE — H&P (Signed)
[facility]  Patient:    Anne [name], Anne [name]                       MRN: [id number] Adm. Date:  05/02/00 Attending:  Marcial Pacas P. Fontaine, M.D.                         History and Physical  CHIEF COMPLAINT:              Right lower quadrant discomfort.  HISTORY OF PRESENT ILLNESS:   This patient is a 60 year old G1 para 1 female, postmenopausal and on hormone replacement therapy, with a several month history of nagging right lower quadrant discomfort.  The patient underwent ultrasound evaluation which showed a 4 cm cystic area with a fluid filled tube-like structure next to the right ovary consistent with a small hydrosalpinx.  She is admitted at this time for laparoscopic evaluation and right salpingo-oophorectomy.  Preoperative evaluation also included a CA 125 which was normal at 7.4.  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  PAST SURGICAL HISTORY:        1. Thyroidectomy.                               2. Tubal sterilization.                               3. Vein surgery.                               4. Breast biopsy x 2.                               5. Hysteroscopy.                               6. Dilatation and curettage.  CURRENT MEDICATIONS:          1. Occasional Celebrex.                               2. Premphase.                               3. Occasional Vicodin.                               4. Synthroid 0.1 mg p.o. q.d.  ALLERGIES:                    1. AUGMENTIN.                               2. CODEINE.  SOCIAL HISTORY:               No cigarette or alcohol use.  FAMILY HISTORY:               Noncontributory.  REVIEW OF SYSTEMS:  Noncontributory.  PHYSICAL EXAMINATION:  HEENT:                        Normal examination.  LUNGS:                        Clear.  CARDIAC:                      Regular rate without murmurs, rubs, or gallops.  ABDOMEN:                       Benign.  PELVIC:                       External, BUS, vagina normal.  Cervix normal. Uterus grossly normal size, nontender.  Adnexa without masses or tenderness.  RECTAL:                       Stool guaiac negative.  ASSESSMENT:                   The patient is a 60 year old gravida 1 para 1 female, postmenopausal, with nagging right lower quadrant discomfort, and ultrasound suggestive of a small hydrosalpinx.  Options of expectant management versus surgical intervention were reviewed and the patient wants surgical removal of this area.  PLAN:                         I reviewed with the patient laparoscopic surgery to include trocar placement, insufflation, use of sharp and blunt dissection, and electrocautery.  The tentative plan is to remove her right fallopian tube and ovary and to inspect her left side and if normal then to leave this side. The patient understands that anytime during the procedure we may need to convert to exploratory laparotomy if surprise findings necessitate better exposure or if complications arise during the procedure.  The patient also understands that if there is surprise finding of cancer we would more than likely terminate the procedure and plan on re-operation at a near future date after more appropriate preoperative counseling and preparation.  The risks of the procedure were reviewed with her to include the risk of hemorrhage necessitating transfusion and the risks of transfusion discussed, the risk of infection - both internal as well as incisional - requiring antibiotics as well as opening and draining of incisions were all reviewed with her.  The risks of inadvertent injury to internal organs including bowel, bladder, ureters, vessels, and nerves necessitating major exploratory reparative surgeries and future reparative surgeries including ostomy formation were all reviewed with her.  The patients initial indication was right lower  quadrant discomfort and she understands that this may or may not be alleviated by the procedure.  No guarantees were made, and she understands and accepts this. DD:  04/27/00 TD:  04/27/00 Job: [id number]

## 2010-11-11 NOTE — Op Note (Signed)
[facility]  Patient:    Anne [name], Anne [name]                     MRN: [id number] Proc. Date: 05/02/00 Adm. Date:  [id number] Attending:  Merrily Pew                           Operative Report  PREOPERATIVE DIAGNOSES:       Cystic right ovarian adnexal mass.  POSTOPERATIVE DIAGNOSES:      Right cystic ovary, left adnexal adhesion, leiomyomata.  PROCEDURE:                    Laparoscopic right salpingo-oophorectomy, lysis left adnexal adhesion, biopsy peritoneal cyst, cell washing.  SURGEON:                      Timothy P. Fontaine, M.D.  ASSISTANT:                    Katy Fitch, M.D.  ANESTHESIA:                   General endotracheal.  COMPLICATIONS:                None.  ESTIMATED BLOOD LOSS:         Minimal.  SPECIMEN:                     1. Cell block.                               2. Peritoneal biopsy.                               3. Right ovary and portion of fallopian tube.  FINDINGS:                     The anterior cul-de-sac normal.  Posterior cul-de-sac grossly normal with small cystic granular peritoneal changes consistent with old inflammatory changes.  Representative biopsy taken. 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  Right and left fallopian tubes grossly normal with changes consistent with past history of tubal sterilization.  Left ovary grossly normal.  Omental adhesion to the left fallopian tube segment where a tubal performed which was lysed.  Right ovary cystic with multiple small cystic changes.  Right ovary removed intact.  No evidence of endometriosis or other pelvic pathology.  Appendix grossly normal, free, and mobile.  Liver smooth.  No abnormalities.  Gallbladder grossly normal with limited inspection.  No upper abdominal disease to limited inspection.  PROCEDURE:                    Patient was taken to the  operating room and underwent general endotracheal anesthesia.  Received an abdominal, peritoneal, vaginal preparation of Betadine scrub and Betadine solution.  Bladder emptied with in-and-out Foley catheterization.  Hulka tenaculum was placed on the cervix and the patient was draped in the usual fashion.  A vertical infraumbilical incision was then made.  The Veress needle placed.  Position verified with water and approximately 2 L carbon dioxide gas infused without difficulty.  The 10 mm laparoscopic Trocar was then placed without  difficulty. The position verified visually.  Right and left 5 mm suprapubic ports were then placed under direct visualization after transillumination for the vessels without difficulty.  Examination of the pelvic organs was then carried out with findings noted above.  A cell washing was obtained and sent to pathology. The right adnexa was then elevated.  The anatomy studied, the ureter found to be far from the operative site.  Through progressive bipolar cauterization and subsequent sharp dissection the right fallopian tube segment and ovary were excised through progressive mesosalpinx excision.  The infundibulopelvic ligaments and vessels were bipolar cauterized several times and then subsequently transected to free the specimen.  There was adequate hemostasis along the entire adnexa status post excision.  The left adnexal omental adhesion was then elevated, again, isolated away from bowel, vascular, and ureteral areas and bipolar cauterized and subsequently sharply lysed and freed.  The left suprapubic 5 mm port was then converted to a 10/11 laparoscopic port under direct visualization without difficulty and a pouch was then placed within the pelvis.  The ovary and fallopian tube segment dropped within the pouch and was subsequently removed from the patient intact. All pedicles were then reinspected showing adequate hemostasis.  The gas was allowed to escape.   The pedicles were observed, again showing adequate hemostasis under a low pressure situation and the infraumbilical port was then backed out under direct visualization showing adequate hemostasis and no evidence of hernia formation.  The left 10/11 port was then exposed and the fascia was reapproximated using 0 Vicryl suture in a short running stitch.  An interrupted subcutaneous fascial stitch was placed infraumbilically and the suprapubic both 5 mm and 10/11 skin incisions were closed using 4-0 Vicryl in interrupted simple stitch.  The infraumbilical incision was then closed using 4-0 Vicryl in an interrupted subcuticular stitch.  All skin incisions were infiltrated using 0.25% Marcaine.  The Hulka tenaculum was removed.  The patient placed in a supine positions.  Sterile dressings applied.  The patient awakened without difficulty and taken to the recovery room in good condition having tolerated the procedure well. DD:  05/02/00 TD:  05/03/00 Job: [id number]

## 2010-11-18 ENCOUNTER — Ambulatory Visit
Admission: RE | Admit: 2010-11-18 | Discharge: 2010-11-18 | Disposition: A | Discharge status: Home / Self Care | Payer: BC Managed Care – PPO | Source: Ambulatory Visit | Attending: Gynecology | Admitting: Gynecology

## 2010-11-18 DIAGNOSIS — Z139 Encounter for screening, unspecified: Secondary | ICD-10-CM

## 2010-11-22 ENCOUNTER — Telehealth: Payer: Self-pay | Admitting: Family Medicine

## 2010-11-22 NOTE — Telephone Encounter (Signed)
Called patient: Mammogram is normal repeat one year.

## 2010-11-23 ENCOUNTER — Telehealth: Payer: Self-pay | Admitting: Family Medicine

## 2010-11-23 DIAGNOSIS — I10 Essential (primary) hypertension: Secondary | ICD-10-CM

## 2010-11-23 MED ORDER — METOPROLOL TARTRATE 25 MG PO TABS: 25.0000 mg | ORAL_TABLET | Freq: Every day | ORAL | Status: DC

## 2010-11-23 NOTE — Telephone Encounter (Signed)
Katie the pharmacist at [facility] called to get patient prescription changed from 30 day to 90 day and she'll refund the patient for the 30 day supply.  Plan:  Refill was sent for the 90 day supply for metoprolol 25 mg without any refills.  Discontinued the 30 day supply so the pharm could credit the patient for that prescription. [name], LPN [facility]

## 2010-11-23 NOTE — Telephone Encounter (Signed)
Left message on vm  With results

## 2010-11-23 NOTE — Telephone Encounter (Signed)
Pt called and she only received a 30 day supply for her metoprolol but she needs 90 day supply.  Has a CPE appt scheduled for August 03-12.  Plan:  Told pt to take back the script to pharm since she has not taken any out of the bottle, and have pharm call and the script will be changed to a 90 day, but she'll need to make sure she keeps the CPE appt. [name], LPN [facility]

## 2010-12-03 ENCOUNTER — Other Ambulatory Visit: Payer: Self-pay | Admitting: Family Medicine

## 2010-12-11 ENCOUNTER — Other Ambulatory Visit: Payer: Self-pay | Admitting: Family Medicine

## 2011-01-19 ENCOUNTER — Telehealth: Payer: Self-pay | Admitting: *Deleted

## 2011-01-19 NOTE — Telephone Encounter (Signed)
Pt called and states she woke up this am around 5 with nausea and vomiting. Temp is 99.BP 147/84. Pt denies pain anywhere. Advised pt to continue to try to push fluids and if she feel like eating do bland foods, ie BRAT diet. Advised to call back if new sxs develop or if fever develops or pain. Pt agreed and voiced understanding

## 2011-01-21 ENCOUNTER — Encounter: Payer: Self-pay | Admitting: Family Medicine

## 2011-01-27 ENCOUNTER — Ambulatory Visit (INDEPENDENT_AMBULATORY_CARE_PROVIDER_SITE_OTHER): Payer: BC Managed Care – PPO | Admitting: Family Medicine

## 2011-01-27 ENCOUNTER — Encounter: Payer: Self-pay | Admitting: Family Medicine

## 2011-01-27 DIAGNOSIS — M949 Disorder of cartilage, unspecified: Secondary | ICD-10-CM

## 2011-01-27 DIAGNOSIS — M899 Disorder of bone, unspecified: Secondary | ICD-10-CM

## 2011-01-27 DIAGNOSIS — E039 Hypothyroidism, unspecified: Secondary | ICD-10-CM

## 2011-01-27 DIAGNOSIS — Z0000 Encounter for general adult medical examination without abnormal findings: Secondary | ICD-10-CM

## 2011-01-27 DIAGNOSIS — R5381 Other malaise: Secondary | ICD-10-CM

## 2011-01-27 DIAGNOSIS — R5383 Other fatigue: Secondary | ICD-10-CM

## 2011-01-27 DIAGNOSIS — M8580 Other specified disorders of bone density and structure, unspecified site: Secondary | ICD-10-CM

## 2011-01-27 LAB — CBC
Hemoglobin: 14 g/dL (ref 12.0–15.0)
MCHC: 33.9 g/dL (ref 30.0–36.0)
Platelets: 213 10*3/uL (ref 150–400)
RDW: 13.8 % (ref 11.5–15.5)

## 2011-01-27 LAB — COMPLETE METABOLIC PANEL WITH GFR
Albumin: 4.5 g/dL (ref 3.5–5.2)
CO2: 28 mEq/L (ref 19–32)
GFR, Est African American: >60 mL/min (ref >60)
GFR, Est Non African American: 56 mL/min — ABNORMAL LOW (ref >60)
Glucose, Bld: 92 mg/dL (ref 70–99)
Sodium: 140 mEq/L (ref 135–145)
Total Bilirubin: 0.8 mg/dL (ref 0.3–1.2)
Total Protein: 7.5 g/dL (ref 6.0–8.3)

## 2011-01-27 LAB — LIPID PANEL
Cholesterol: 222 mg/dL — ABNORMAL HIGH (ref 0–200)
Triglycerides: 90 mg/dL (ref <150)

## 2011-01-27 MED ORDER — BUDESONIDE 32 MCG/ACT NA SUSP: 2.0000 | Freq: Two times a day (BID) | NASAL | Status: DC

## 2011-01-27 NOTE — Progress Notes (Signed)
  Subjective:     Anne [name] is a 60 y.o. female and is here for a comprehensive physical exam. The patient reports no problems.  History   Social History  . Marital Status: Married    Spouse Name: N/A    Number of Children: 1  . Years of Education: N/A   Occupational History  . Not on file.   Social History Main Topics  . Smoking status: Former Smoker    Types: Cigarettes    Quit date: 01/27/1979  . Smokeless tobacco: Not on file  . Alcohol Use: No  . Drug Use: No  . Sexually Active: Not on file   Other Topics Concern  . Not on file   Social History Narrative   4 brothers and 6 sisters. All 4 brother deceased.  2 sisters deceased No regular exercise. Still working.     Health Maintenance  Topic Date Due  . Colonoscopy  09/10/1998  . Zostavax  09/09/2008  . Influenza Vaccine  03/27/2011  . Mammogram  11/17/2012  . Pap Smear  03/26/2013  . Tetanus/tdap  01/22/2020    The following portions of the patient's history were reviewed and updated as appropriate: allergies, current medications, past family history, past medical history, past social history and past surgical history.  Review of Systems A comprehensive review of systems was negative.   Objective:    BP 120/64  Pulse 76  Ht 5\' 5"  (1.651 m)  Wt 164 lb (74.39 kg)  BMI 27.29 kg/m2 General appearance: alert, cooperative and appears stated age Head: Normocephalic, without obvious abnormality, atraumatic Eyes: conjunctiva, EOMi, PEERLA Ears: normal TM's and external ear canals both ears Nose: Nares normal. Septum midline. Mucosa normal. No drainage or sinus tenderness. Throat: lips, mucosa, and tongue normal; teeth and gums normal Neck: no adenopathy, no carotid bruit, supple, symmetrical, trachea midline and thyroid not enlarged, symmetric, no tenderness/mass/nodules Back: symmetric, no curvature. ROM normal. No CVA tenderness. Lungs: clear to auscultation bilaterally Heart: regular rate and rhythm, S1, S2  normal, no murmur, click, rub or gallop Abdomen: soft, non-tender; bowel sounds normal; no masses,  no organomegaly Extremities: extremities normal, atraumatic, no cyanosis or edema Pulses: 2+ and symmetric Skin: Skin color, texture, turgor normal. No rashes or lesions Lymph nodes: Cervical, supraclavicular, and axillary nodes normal. Neurologic: Alert and oriented X 3, normal strength and tone. Normal symmetric reflexes. Normal coordination and gait    Assessment:    Healthy female exam.      Plan:     See After Visit Summary for Counseling Recommendations  Start a regular exercise program and make sure you are eating a healthy diet Try to eat 4 servings of dairy a day or take a calcium supplement (500mg  twice a day). Your vaccines are up to date.  Discussed getting her shingles vaccines. She thinks her insurance won't cover it but I asked her to check with them.

## 2011-01-27 NOTE — Patient Instructions (Signed)
Start a regular exercise program and make sure you are eating a healthy diet Try to eat 4 servings of dairy a day or take a calcium supplement (500mg twice a day). Your vaccines are up to date.   

## 2011-01-30 ENCOUNTER — Telehealth: Payer: Self-pay | Admitting: Family Medicine

## 2011-01-30 NOTE — Telephone Encounter (Signed)
BP can elevated kidney funciton or if she was a little dry (hadn't taken much fluids in) it could elevated a little.

## 2011-01-30 NOTE — Telephone Encounter (Signed)
Left message on vm with results and to call bck if anything had changed her diet

## 2011-01-30 NOTE — Telephone Encounter (Signed)
Pt came in to the office and needs more detailed explanation of her lab results. Plan:  Went over the pts recent lab results.            Gave schedule as to when to repeat the next labs.            Gave low fat low chol handouts for instructional purposes and self helps.  Routed to Dr. Kathrin [name] wants to know what could possibly be causing her kidney function # 's to be creeping up.  Please advise. Anne Newcomer, LPN Anne [name]

## 2011-01-30 NOTE — Telephone Encounter (Addendum)
Call pt: Vitamin D and CmP look good. Kidney function It up just a little from last time so I would like to recheck in about 2 months. Magnesium looks great. Cholesterol is really up from last year. Has something really changed in her diet or exercise level? Work on low fat deit and exercise and recheck in 6 mo.

## 2011-01-30 NOTE — Telephone Encounter (Signed)
Pt informed about her kidney function, but I forgot to tell you she had not been faithful in taking her simvastatin prior to the lab draw.  Told the pt to get herself back on the medication, and we'll recheck it in 6 mths.  The numbers should reflect if she has been taking med after that time. Routed to Dr. Marlyne Beards, LPN [facility]

## 2011-03-06 ENCOUNTER — Other Ambulatory Visit: Payer: Self-pay | Admitting: Family Medicine

## 2011-04-04 ENCOUNTER — Other Ambulatory Visit: Payer: Self-pay | Admitting: Family Medicine

## 2011-04-14 DIAGNOSIS — E785 Hyperlipidemia, unspecified: Secondary | ICD-10-CM | POA: Insufficient documentation

## 2011-04-14 DIAGNOSIS — E079 Disorder of thyroid, unspecified: Secondary | ICD-10-CM | POA: Insufficient documentation

## 2011-04-14 DIAGNOSIS — M8580 Other specified disorders of bone density and structure, unspecified site: Secondary | ICD-10-CM | POA: Insufficient documentation

## 2011-04-14 DIAGNOSIS — IMO0002 Reserved for concepts with insufficient information to code with codable children: Secondary | ICD-10-CM | POA: Insufficient documentation

## 2011-04-18 ENCOUNTER — Telehealth: Payer: Self-pay | Admitting: *Deleted

## 2011-04-18 DIAGNOSIS — R102 Pelvic and perineal pain: Secondary | ICD-10-CM

## 2011-04-18 NOTE — Telephone Encounter (Signed)
Okay, order placed.

## 2011-04-18 NOTE — Telephone Encounter (Signed)
Pt called complaining of pelvic pain throbbing and sharp pain while from sitting to standing, and bending. Pt says that this is the same pain when left ovary removed. Her annual is scheduled for Friday 04/21/11 pt wants to know if she could have an ultrasound done the same day as well? Please advise

## 2011-04-18 NOTE — Telephone Encounter (Signed)
Pt informed with the below note, she will be at office at 8:15am for ultrasound.

## 2011-04-21 ENCOUNTER — Encounter: Payer: Self-pay | Admitting: Gynecology

## 2011-04-21 ENCOUNTER — Ambulatory Visit (INDEPENDENT_AMBULATORY_CARE_PROVIDER_SITE_OTHER): Payer: BC Managed Care – PPO

## 2011-04-21 ENCOUNTER — Ambulatory Visit (INDEPENDENT_AMBULATORY_CARE_PROVIDER_SITE_OTHER): Payer: BC Managed Care – PPO | Admitting: Gynecology

## 2011-04-21 VITALS — BP 124/80 | Ht 63.5 in | Wt 165.0 lb

## 2011-04-21 DIAGNOSIS — N898 Other specified noninflammatory disorders of vagina: Secondary | ICD-10-CM

## 2011-04-21 DIAGNOSIS — B9689 Other specified bacterial agents as the cause of diseases classified elsewhere: Secondary | ICD-10-CM

## 2011-04-21 DIAGNOSIS — Z01419 Encounter for gynecological examination (general) (routine) without abnormal findings: Secondary | ICD-10-CM

## 2011-04-21 DIAGNOSIS — N949 Unspecified condition associated with female genital organs and menstrual cycle: Secondary | ICD-10-CM

## 2011-04-21 DIAGNOSIS — R102 Pelvic and perineal pain: Secondary | ICD-10-CM

## 2011-04-21 DIAGNOSIS — B373 Candidiasis of vulva and vagina: Secondary | ICD-10-CM

## 2011-04-21 DIAGNOSIS — N760 Acute vaginitis: Secondary | ICD-10-CM

## 2011-04-21 DIAGNOSIS — A499 Bacterial infection, unspecified: Secondary | ICD-10-CM

## 2011-04-21 MED ORDER — FLUCONAZOLE 150 MG PO TABS: 150.0000 mg | ORAL_TABLET | Freq: Once | ORAL | Status: AC

## 2011-04-21 MED ORDER — CLINDAMYCIN PHOSPHATE 2 % VA CREA: 1.0000 | TOPICAL_CREAM | Freq: Every day | VAGINAL | Status: AC

## 2011-04-21 NOTE — Progress Notes (Signed)
Anne [name] 10/27/1950 [id number]        60 y.o.  for annual exam.  Overall doing well. She did call before her appointment complaining of some pelvic aching more suprapubic and we scheduled her for an ultrasound also today at the same time as her appointment. She's having no urinary symptoms such as frequency dysuria no constipation diarrhea or GI symptoms no fevers chills or constitutional symptoms.  Past medical history,surgical history, medications, allergies, family history and social history were all reviewed and documented in the EPIC chart. ROS:  Was performed and pertinent positives and negatives are included in the history.  Exam: chaperone present Filed Vitals:   04/21/11 0853  BP: 124/80   General appearance  Normal Skin grossly normal Head/Neck normal with no cervical or supraclavicular adenopathy thyroid normal Lungs  clear Cardiac RR, without RMG Abdominal  soft, nontender, without masses, organomegaly or hernia Breasts  examined lying and sitting without masses, retractions, discharge or axillary adenopathy. Pelvic  Ext/BUS/vagina  normal with atrophic changes and slight yellowish discharge  Cervix  normal    Uterus  axial, normal size, shape and contour, midline and mobile nontender   Adnexa  Without masses or tenderness    Anus and perineum  normal   Rectovaginal  normal sphincter tone without palpated masses or tenderness.    Assessment/Plan:  60 y.o. female for annual exam.    1. Yellow discharge. KOH wet prep is positive for yeast and BV we'll treat with Diflucan 150x1 dose Cleocin vaginal cream nightly x1 week at her choice. The need to stop her cholesterol medicine with the Diflucan was discussed. 2. Superpubic discomfort. Ultrasound today is normal showing endometrial echo at 2.0 mm left ovary normal right adnexa is negative. No cul-de-sac fluid.  We will check a urinalysis. She does get this type of pain sometimes if she has BV and hopefully treatment per  #1 will help. Assuming discomfort resolves will follow if it persists she knows to call. 3. Osteopenia. DEXA last year was stable with a T score -1.2 at the [facility] spine. We will repeat next year. 4. Health maintenance. SBE monthly reviewed. Mammogram May 2012 normal, continue annual mammography. She is up-to-date with colon screening. No Pap smear was done today. I reviewed current screening guidelines and she has no history of abnormal Pap smears with multiple normals in her chart the last in 2011. She's comfortable with every three-year screening. No blood work was done today this was all done through her primary's office. Assuming she continues well from a gynecologic standpoint she'll see [place] in a year sooner as needed.    Dara Lords MD, 9:24 AM 04/21/2011

## 2011-04-24 ENCOUNTER — Telehealth: Payer: Self-pay | Admitting: *Deleted

## 2011-04-24 NOTE — Telephone Encounter (Signed)
Patient called to let you know she has decided that she does want to get paps done every year instead of the three year rec.  Patient says her insurance will cover it and she said for her peace of mind she wants to make sure too.  Wants to know if we can schedule pap?

## 2011-04-24 NOTE — Telephone Encounter (Signed)
Per Dr. Audie Box, he said ok to do.  Called patient and informed.

## 2011-05-05 DIAGNOSIS — E0500 Thyrotoxicosis with diffuse goiter without thyrotoxic crisis or storm: Secondary | ICD-10-CM | POA: Insufficient documentation

## 2011-05-12 ENCOUNTER — Other Ambulatory Visit (HOSPITAL_COMMUNITY)
Admission: RE | Admit: 2011-05-12 | Discharge: 2011-05-12 | Disposition: A | Discharge status: Home / Self Care | Payer: BC Managed Care – PPO | Source: Ambulatory Visit | Attending: Gynecology | Admitting: Gynecology

## 2011-05-12 ENCOUNTER — Encounter: Payer: Self-pay | Admitting: Gynecology

## 2011-05-12 ENCOUNTER — Ambulatory Visit (INDEPENDENT_AMBULATORY_CARE_PROVIDER_SITE_OTHER): Payer: BC Managed Care – PPO | Admitting: Gynecology

## 2011-05-12 VITALS — BP 130/70

## 2011-05-12 DIAGNOSIS — Z01419 Encounter for gynecological examination (general) (routine) without abnormal findings: Secondary | ICD-10-CM | POA: Insufficient documentation

## 2011-05-12 DIAGNOSIS — R300 Dysuria: Secondary | ICD-10-CM

## 2011-05-12 DIAGNOSIS — N952 Postmenopausal atrophic vaginitis: Secondary | ICD-10-CM

## 2011-05-12 NOTE — Progress Notes (Signed)
Patient presents with 2 issues. The first is that she has decided she wants to get a Pap smear. I talked to her annual talked about less frequent screening but she got very nervous about this and wants a Pap smear. She also is complaining of some mild dysuria with Lugol's staining with urination.  Exam Pelvic external BUS vagina was atrophic genital changes no discharge. Cervix atrophic Pap smear done  Assessment and plan: Pap smear screening done at the patient's request. Mild dysuria, UA is negative recommended pushing fluids if it persists she'll represent.

## 2011-05-12 NOTE — Progress Notes (Signed)
Addended by: [place], Dhanvin Szeto E on: 05/12/2011 09:25 AM   Modules accepted: Orders

## 2011-05-24 ENCOUNTER — Other Ambulatory Visit: Payer: Self-pay | Admitting: Family Medicine

## 2011-06-02 ENCOUNTER — Ambulatory Visit (INDEPENDENT_AMBULATORY_CARE_PROVIDER_SITE_OTHER): Payer: BC Managed Care – PPO | Admitting: Family Medicine

## 2011-06-02 DIAGNOSIS — Z23 Encounter for immunization: Secondary | ICD-10-CM

## 2011-06-02 NOTE — Progress Notes (Signed)
  Subjective:    Patient ID: Anne [name], female    DOB: 19-May-1951, 60 y.o.   MRN: [id number]  HPI Flu shot administered    Review of Systems     Objective:   Physical Exam        Assessment & Plan:

## 2011-06-19 ENCOUNTER — Other Ambulatory Visit: Payer: Self-pay | Admitting: Family Medicine

## 2011-06-26 ENCOUNTER — Other Ambulatory Visit: Payer: Self-pay | Admitting: Family Medicine

## 2011-07-14 ENCOUNTER — Ambulatory Visit (INDEPENDENT_AMBULATORY_CARE_PROVIDER_SITE_OTHER): Payer: BC Managed Care – PPO | Admitting: Family Medicine

## 2011-07-14 ENCOUNTER — Other Ambulatory Visit: Payer: Self-pay | Admitting: Family Medicine

## 2011-07-14 ENCOUNTER — Encounter: Payer: Self-pay | Admitting: Family Medicine

## 2011-07-14 VITALS — BP 119/70 | HR 77 | Wt 164.0 lb

## 2011-07-14 DIAGNOSIS — B3731 Acute candidiasis of vulva and vagina: Secondary | ICD-10-CM

## 2011-07-14 DIAGNOSIS — E785 Hyperlipidemia, unspecified: Secondary | ICD-10-CM

## 2011-07-14 DIAGNOSIS — R102 Pelvic and perineal pain unspecified side: Secondary | ICD-10-CM

## 2011-07-14 DIAGNOSIS — B373 Candidiasis of vulva and vagina: Secondary | ICD-10-CM

## 2011-07-14 DIAGNOSIS — N949 Unspecified condition associated with female genital organs and menstrual cycle: Secondary | ICD-10-CM

## 2011-07-14 LAB — POCT URINALYSIS DIPSTICK
Bilirubin, UA: NEGATIVE
Glucose, UA: NEGATIVE
Ketones, UA: NEGATIVE
Spec Grav, UA: 1.025

## 2011-07-14 MED ORDER — FLUCONAZOLE 150 MG PO TABS: 150.0000 mg | ORAL_TABLET | Freq: Once | ORAL | Status: AC

## 2011-07-14 NOTE — Progress Notes (Signed)
Addended by: Nani Gasser D on: 07/14/2011 05:24 PM   Modules accepted: Orders

## 2011-07-14 NOTE — Progress Notes (Signed)
  Subjective:    Patient ID: Anne [name], female    DOB: 12-Dec-1950, 61 y.o.   MRN: [id number]  HPI Burning with urinarte.  Feels achey in her pelvic region and itchy and feels like  A yeast infection.  No fever or hematuria.  Some urinary freq. No rash or discharge.  No OTC meds for this  Lipids - takng her statin without any SE.  No myalgias. Hasn't gone for repeat bloodwork.   Review of Systems     Objective:   Physical Exam  Constitutional: She appears well-developed and well-nourished.  Abdominal: Soft. Bowel sounds are normal. She exhibits no distension and no mass. There is tenderness. There is no rebound and no guarding.       Mild suprapubic tenderness.   Musculoskeletal:       No CVA tenderness.           Assessment & Plan:  Vaginitis - will check UA and send a wet prep. UA is neg so will send in rx for diflucan.  Will send wet prep. Will call with results next week.   Hyperlipidiemai- Given labs slip today to recheck levels and adjust med at needed.

## 2011-07-14 NOTE — Patient Instructions (Signed)
We will call you with your lab results. If you don't here from us in about a week then please give us a call at [phone].  

## 2011-07-17 LAB — WET PREP, GENITAL
Trich, Wet Prep: NONE SEEN
Yeast Wet Prep HPF POC: NONE SEEN

## 2011-07-18 ENCOUNTER — Other Ambulatory Visit: Payer: Self-pay | Admitting: Family Medicine

## 2011-07-18 MED ORDER — METRONIDAZOLE 500 MG PO TABS: 500.0000 mg | ORAL_TABLET | Freq: Two times a day (BID) | ORAL | Status: AC

## 2011-07-24 ENCOUNTER — Telehealth: Payer: Self-pay | Admitting: *Deleted

## 2011-07-24 NOTE — Telephone Encounter (Signed)
Pt states she called on Friday and spoke with "the nurse on call" to let her know that she was having blurred vision (which she states was a reaction listed on the paper from the pharmacy) to Flagyl. States that she was told to stop the Flagyl but she decided after speaking with her sister to continue the Flagyl. States the blurred vision has cleared up. Pt states she has 2 days left but wanted you to know. FYI

## 2011-08-11 ENCOUNTER — Other Ambulatory Visit: Payer: Self-pay | Admitting: Family Medicine

## 2011-08-11 LAB — LIPID PANEL
HDL: 42 mg/dL (ref >39)
Total CHOL/HDL Ratio: 4.5 Ratio
Triglycerides: 98 mg/dL (ref <150)

## 2011-08-12 ENCOUNTER — Other Ambulatory Visit: Payer: Self-pay | Admitting: Family Medicine

## 2011-08-12 LAB — COMPLETE METABOLIC PANEL WITH GFR
ALT: 15 U/L (ref 0–35)
AST: 16 U/L (ref 0–37)
Albumin: 4.3 g/dL (ref 3.5–5.2)
Alkaline Phosphatase: 72 U/L (ref 39–117)
BUN: 7 mg/dL (ref 6–23)
CO2: 28 mEq/L (ref 19–32)
Calcium: 9.3 mg/dL (ref 8.4–10.5)
Chloride: 105 mEq/L (ref 96–112)
Creat: 0.9 mg/dL (ref 0.50–1.10)
GFR, Est African American: 79 mL/min
GFR, Est Non African American: 69 mL/min
Glucose, Bld: 97 mg/dL (ref 70–99)
Potassium: 4.6 mEq/L (ref 3.5–5.3)
Sodium: 143 mEq/L (ref 135–145)
Total Bilirubin: 0.6 mg/dL (ref 0.3–1.2)
Total Protein: 6.9 g/dL (ref 6.0–8.3)

## 2011-08-12 MED ORDER — ATORVASTATIN CALCIUM 40 MG PO TABS: 40.0000 mg | ORAL_TABLET | Freq: Every day | ORAL | Status: DC

## 2011-08-17 ENCOUNTER — Telehealth: Payer: Self-pay | Admitting: *Deleted

## 2011-08-17 NOTE — Telephone Encounter (Signed)
Pt states that she had her thyroid removed and wants to make sure she can still take the shingles vaccine.

## 2011-08-17 NOTE — Telephone Encounter (Signed)
Yes, she can still have the shingles vaccine.

## 2011-08-18 ENCOUNTER — Ambulatory Visit (INDEPENDENT_AMBULATORY_CARE_PROVIDER_SITE_OTHER): Payer: BC Managed Care – PPO | Admitting: Family Medicine

## 2011-08-18 VITALS — BP 131/74 | HR 93

## 2011-08-18 DIAGNOSIS — Z23 Encounter for immunization: Secondary | ICD-10-CM

## 2011-08-18 NOTE — Telephone Encounter (Signed)
Pt informed and will schedule for vaccine

## 2011-08-18 NOTE — Progress Notes (Signed)
  Subjective:    Patient ID: Anne [name], female    DOB: September 22, 1950, 61 y.o.   MRN: [id number] Shingles vaccine HPI    Review of Systems     Objective:   Physical Exam        Assessment & Plan:

## 2011-08-24 ENCOUNTER — Other Ambulatory Visit: Payer: Self-pay | Admitting: *Deleted

## 2011-08-24 MED ORDER — MELOXICAM 15 MG PO TABS: 15.0000 mg | ORAL_TABLET | Freq: Every day | ORAL | Status: DC

## 2011-09-21 ENCOUNTER — Encounter: Payer: Self-pay | Admitting: Family Medicine

## 2011-09-21 ENCOUNTER — Ambulatory Visit (INDEPENDENT_AMBULATORY_CARE_PROVIDER_SITE_OTHER): Payer: BC Managed Care – PPO | Admitting: Family Medicine

## 2011-09-21 VITALS — BP 116/73 | HR 81 | Ht 65.0 in | Wt 163.0 lb

## 2011-09-21 DIAGNOSIS — R197 Diarrhea, unspecified: Secondary | ICD-10-CM

## 2011-09-21 DIAGNOSIS — E785 Hyperlipidemia, unspecified: Secondary | ICD-10-CM

## 2011-09-21 DIAGNOSIS — R109 Unspecified abdominal pain: Secondary | ICD-10-CM

## 2011-09-21 MED ORDER — SIMVASTATIN 40 MG PO TABS: 40.0000 mg | ORAL_TABLET | Freq: Every evening | ORAL | Status: DC

## 2011-09-21 NOTE — Patient Instructions (Signed)
Diarrhea Infections caused by germs (bacterial) or a virus commonly cause diarrhea. Your caregiver has determined that with time, rest and fluids, the diarrhea should improve. In general, eat normally while drinking more water than usual. Although water may prevent dehydration, it does not contain salt and minerals (electrolytes). Broths, weak tea without caffeine and oral rehydration solutions (ORS) replace fluids and electrolytes. Small amounts of fluids should be taken frequently. Large amounts at one time may not be tolerated. Plain water may be harmful in infants and the elderly. Oral rehydrating solutions (ORS) are available at pharmacies and grocery stores. ORS replace water and important electrolytes in proper proportions. Sports drinks are not as effective as ORS and may be harmful due to sugars worsening diarrhea.  ORS is especially recommended for use in children with diarrhea. As a general guideline for children, replace any new fluid losses from diarrhea and/or vomiting with ORS as follows:   If your child weighs 22 pounds or under (10 kg or less), give 60-120 mL ( -  cup or 2 - 4 ounces) of ORS for each episode of diarrheal stool or vomiting episode.   If your child weighs more than 22 pounds (more than 10 kgs), give 120-240 mL ( - 1 cup or 4 - 8 ounces) of ORS for each diarrheal stool or episode of vomiting.   While correcting for dehydration, children should eat normally. However, foods high in sugar should be avoided because this may worsen diarrhea. Large amounts of carbonated soft drinks, juice, gelatin desserts and other highly sugared drinks should be avoided.   After correction of dehydration, other liquids that are appealing to the child may be added. Children should drink small amounts of fluids frequently and fluids should be increased as tolerated. Children should drink enough fluids to keep urine clear or pale yellow.   Adults should eat normally while drinking more fluids  than usual. Drink small amounts of fluids frequently and increase as tolerated. Drink enough fluids to keep urine clear or pale yellow. Broths, weak decaffeinated tea, lemon lime soft drinks (allowed to go flat) and ORS replace fluids and electrolytes.   Avoid:   Carbonated drinks.   Juice.   Extremely hot or cold fluids.   Caffeine drinks.   Fatty, greasy foods.   Alcohol.   Tobacco.   Too much intake of anything at one time.   Gelatin desserts.   Probiotics are active cultures of beneficial bacteria. They may lessen the amount and number of diarrheal stools in adults. Probiotics can be found in yogurt with active cultures and in supplements.   Wash hands well to avoid spreading bacteria and virus.   Anti-diarrheal medications are not recommended for infants and children.   Only take over-the-counter or prescription medicines for pain, discomfort or fever as directed by your caregiver. Do not give aspirin to children because it may cause Reye's Syndrome.   For adults, ask your caregiver if you should continue all prescribed and over-the-counter medicines.   If your caregiver has given you a follow-up appointment, it is very important to keep that appointment. Not keeping the appointment could result in a chronic or permanent injury, and disability. If there is any problem keeping the appointment, you must call back to this facility for assistance.  SEEK IMMEDIATE MEDICAL CARE IF:   You or your child is unable to keep fluids down or other symptoms or problems become worse in spite of treatment.   Vomiting or diarrhea develops and becomes persistent.     There is vomiting of blood or bile (green material).   There is blood in the stool or the stools are black and tarry.   There is no urine output in 6-8 hours or there is only a small amount of very dark urine.   Abdominal pain develops, increases or localizes.   You have a fever.   Your baby is older than 3 months with a  rectal temperature of 102 F (38.9 C) or higher.   Your baby is 3 months old or younger with a rectal temperature of 100.4 F (38 C) or higher.   You or your child develops excessive weakness, dizziness, fainting or extreme thirst.   You or your child develops a rash, stiff neck, severe headache or become irritable or sleepy and difficult to awaken.  MAKE SURE YOU:   Understand these instructions.   Will watch your condition.   Will get help right away if you are not doing well or get worse.  Document Released: 06/02/2002 Document Revised: 06/01/2011 Document Reviewed: 04/19/2009 [employer] Patient Information 2012 [employer]. 

## 2011-09-21 NOTE — Progress Notes (Signed)
  Subjective:    Patient ID: Anne [name], female    DOB: 1951/02/08, 61 y.o.   MRN: [id number]  HPI Night before last felt nauseated when went to bed. Then woke up in the ;middle of the nights and felt like might vomit but never did. She was also have chilld and abdominal pressure.  Diarrhea, watery.  Says her legs were jerking.  Never vomiting.  Had diarrhea several times throught the night.  Dec appetite.  Today has some pain around her navel.  Feels uncomfortable. No more diarrhea.  Nausea has improved.  No known exposure to diarrhea.  Has happened 4 tims. Last time was in January.  Usually just last a couple of days.  Last colonoscpy was in 2008.  Had CT of abdomen as well at that time that showed have a fold in the GB.  GAs and belching. Still taking her omeprazole regularly.     Review of Systems     Objective:   Physical Exam  Constitutional: She is oriented to person, place, and time. She appears well-developed and well-nourished.  HENT:  Head: Normocephalic and atraumatic.  Cardiovascular: Normal rate, regular rhythm and normal heart sounds.   Pulmonary/Chest: Effort normal and breath sounds normal.  Abdominal: Soft. Bowel sounds are normal. She exhibits no distension and no mass. There is tenderness. There is no rebound and no guarding.       MIldy tender in the LLQ.   Neurological: She is alert and oriented to person, place, and time.  Skin: Skin is warm and dry.  Psychiatric: She has a normal mood and affect. Her behavior is normal.          Assessment & Plan:  Diarrhea with diffuse abdominal pain-based on her history and exam Consider IBS. Less likely to be diverticulis bc no fever but will check a CBC w/ diff today.  Because her pain is diffuse and she had no vomiting cholecystitis is less likely as well. I will check liver enzymes and pancreatic enzymes. She is feeling better today most of her episodes are very short lived only 2-3 days at a time. Also another reason  that this is less likely that he diverticulitis.  Hyperlipidemia-she says that we change her to Lipitor off of the simvastatin but she was actually taking half of a tablet of the 40 mg simvastatin. She would like to go back to the simvastatin 40 mg tablet if possible because it is less expensive. There is no problem, we will switch back and she will just take a whole tab. When she's been back on the whole town for a month I would like to call the office to get a lab slip to recheck her lipid panel.

## 2011-09-22 LAB — COMPLETE METABOLIC PANEL WITH GFR
ALT: 17 U/L (ref 0–35)
AST: 19 U/L (ref 0–37)
Alkaline Phosphatase: 78 U/L (ref 39–117)
CO2: 28 mEq/L (ref 19–32)
Sodium: 141 mEq/L (ref 135–145)
Total Bilirubin: 0.9 mg/dL (ref 0.3–1.2)
Total Protein: 7.1 g/dL (ref 6.0–8.3)

## 2011-09-22 LAB — CBC WITH DIFFERENTIAL/PLATELET
Basophils Relative: 0 % (ref 0–1)
Eosinophils Absolute: 0.2 10*3/uL (ref 0.0–0.7)
Lymphs Abs: 1.5 10*3/uL (ref 0.7–4.0)
MCH: 28.3 pg (ref 26.0–34.0)
Neutrophils Relative %: 63 % (ref 43–77)
Platelets: 224 10*3/uL (ref 150–400)
RBC: 4.95 MIL/uL (ref 3.87–5.11)

## 2011-09-25 ENCOUNTER — Other Ambulatory Visit: Payer: Self-pay | Admitting: *Deleted

## 2011-09-25 MED ORDER — SIMVASTATIN 40 MG PO TABS: 40.0000 mg | ORAL_TABLET | Freq: Every evening | ORAL | Status: DC

## 2011-09-27 ENCOUNTER — Encounter: Payer: Self-pay | Admitting: *Deleted

## 2011-09-28 HISTORY — PX: LAPAROSCOPIC CHOLECYSTECTOMY: SUR755

## 2011-10-02 ENCOUNTER — Other Ambulatory Visit: Payer: Self-pay | Admitting: *Deleted

## 2011-10-02 ENCOUNTER — Encounter: Payer: Self-pay | Admitting: Family Medicine

## 2011-10-03 ENCOUNTER — Other Ambulatory Visit: Payer: Self-pay | Admitting: Family Medicine

## 2011-10-20 ENCOUNTER — Other Ambulatory Visit: Payer: Self-pay | Admitting: Gynecology

## 2011-10-20 ENCOUNTER — Ambulatory Visit: Payer: BC Managed Care – PPO | Admitting: Family Medicine

## 2011-10-20 DIAGNOSIS — Z1231 Encounter for screening mammogram for malignant neoplasm of breast: Secondary | ICD-10-CM

## 2011-11-09 ENCOUNTER — Telehealth: Payer: Self-pay | Admitting: *Deleted

## 2011-11-09 DIAGNOSIS — E785 Hyperlipidemia, unspecified: Secondary | ICD-10-CM

## 2011-11-10 ENCOUNTER — Ambulatory Visit (INDEPENDENT_AMBULATORY_CARE_PROVIDER_SITE_OTHER): Payer: BC Managed Care – PPO | Admitting: Family Medicine

## 2011-11-10 ENCOUNTER — Encounter: Payer: Self-pay | Admitting: Family Medicine

## 2011-11-10 VITALS — BP 112/75 | HR 73 | Ht 65.0 in | Wt 157.0 lb

## 2011-11-10 DIAGNOSIS — G629 Polyneuropathy, unspecified: Secondary | ICD-10-CM

## 2011-11-10 DIAGNOSIS — M79609 Pain in unspecified limb: Secondary | ICD-10-CM

## 2011-11-10 DIAGNOSIS — M79604 Pain in right leg: Secondary | ICD-10-CM

## 2011-11-10 DIAGNOSIS — M545 Low back pain, unspecified: Secondary | ICD-10-CM

## 2011-11-10 LAB — LIPID PANEL
Cholesterol: 176 mg/dL (ref 0–200)
Triglycerides: 85 mg/dL (ref <150)

## 2011-11-10 MED ORDER — HYDROCODONE-ACETAMINOPHEN 5-500 MG PO TABS
1.0000 | ORAL_TABLET | Freq: Two times a day (BID) | ORAL | Status: DC | PRN
Start: 2011-11-10 — End: 2016-09-21

## 2011-11-10 NOTE — Progress Notes (Signed)
Subjective:    Patient ID: Anne [name], female    DOB: [birth year], 61 y.o.   MRN: [id number]  HPI Right hip pain for the last few months.  Comes and goes.  She describes the pain as an ache. When wakes up in the AM can put a lot of weight on her leg initially.  Will get a pain in her buttock area.  Limps.  Feels numbness in her big toe and instep of the right foot. Pain down the front of her leg too.   Some mild pain in her back and has hx of disc issues in her low back.  After about 3 hours can walk more normally. No collapse or given out. Takes her mobic every day.  Uses tylenol sometimes. She did take some old Vicodin at bedtime and has been helpful.   Review of Systems  BP 112/75  Pulse 73  Ht 5\' 5"  (1.651 m)  Wt 157 lb (71.215 kg)  BMI 26.13 kg/m2  LMP 09/08/2000    Allergies  Allergen Reactions  . Amoxicillin-Pot Clavulanate     REACTION: blisters in colon  . Ceftriaxone Sodium     REACTION: throat swells (ROCEPHIN)  . Codeine     REACTION: nausea  . Fluconazole In Dextrose     ? INTERACTION WITH SIMVASTATIN  . Metronidazole   . Molds & Smuts     Past Medical History  Diagnosis Date  . Disc herniation     neck and low back  . Follicular cystitis   . Hiatal hernia   . Schatzki's ring   . Osteopenia 11/2009    -1.2 AP spine  . Hyperlipidemia     ELEV CHOLESTEROL  . Thyroid disease     HYPOTHYROID  . Acid reflux     Past Surgical History  Procedure Date  . Thyroidectomy 1976  . Varicose veins 1972  . Cataract extraction 2011    LEFT  . Breast surgery 1997    breast tissue biopsy-benign/.BIOPSIES x 2  . Hysteroscopy   . Oophorectomy 04/2000    LAP RSO/LYSIS OF ADHESIONS  . Tubal ligation 1976  . Dilation and curettage of uterus 1996    x2 , 1999  . Laparoscopic cholecystectomy 09/28/11    History   Social History  . Marital Status: Married    Spouse Name: N/A    Number of Children: 1  . Years of Education: N/A   Occupational History  . Not  on file.   Social History Main Topics  . Smoking status: Former Smoker    Types: Cigarettes    Quit date: 01/27/1979  . Smokeless tobacco: Never Used  . Alcohol Use: No  . Drug Use: No  . Sexually Active: No   Other Topics Concern  . Not on file   Social History Narrative   4 brothers and 6 sisters. All 4 brother deceased.  2 sisters deceased No regular exercise. Still working.      Family History  Problem Relation Age of Onset  . Stroke Sister   . Hypertension Sister   . Arthritis Sister   . Lupus Sister   . Cancer Sister   . Breast cancer Sister   . Hyperlipidemia Brother     deceased  . Hypertension Brother   . Arthritis Daughter   . Heart attack Father 65  . Heart disease Father   . Cancer Brother 56    melanoma    Outpatient Encounter Prescriptions as of 11/10/2011  Medication Sig Dispense Refill  . aspirin 81 MG tablet Take 81 mg by mouth daily.        . budesonide (RHINOCORT AQUA) 32 MCG/ACT nasal spray Place 2 sprays into the nose 2 (two) times daily.  1 Bottle  1  . CALCIUM PO Take by mouth.      . Cholecalciferol (VITAMIN D) 2000 UNITS tablet Take 2,000 Units by mouth daily.        [name] levothyroxine (SYNTHROID, LEVOTHROID) 75 MCG tablet Take 75 mcg by mouth daily.        . meloxicam (MOBIC) 15 MG tablet Take 1 tablet (15 mg total) by mouth daily.  30 tablet  2  . metoprolol tartrate (LOPRESSOR) 25 MG tablet TAKE 1 TABLET (25 MG TOTAL) BY MOUTH DAILY.  90 tablet  0  . Multiple Vitamin (MULTIVITAMIN) tablet Take 1 tablet by mouth daily.        [name] omeprazole (PRILOSEC) 40 MG capsule TAKE 1 CAPSULE (40 MG TOTAL) BY MOUTH DAILY.  90 capsule  1  . simvastatin (ZOCOR) 40 MG tablet Take 1 tablet (40 mg total) by mouth every evening.  30 tablet  4  . HYDROcodone-acetaminophen (VICODIN) 5-500 MG per tablet Take 1 tablet by mouth 2 (two) times daily as needed.  45 tablet  0          Objective:   Physical Exam  Constitutional: She is oriented to person, place, and  time. She appears well-developed and well-nourished.  Musculoskeletal:       Nontender over the lumbar spine or paraspinous muscles. Nontender over the SI joints. Nontender of the buttock or the right greater trochanteric bursa. Hip, knee, ankle strength is 5 out of 5. Normal range of motion of the hip without significant pain or discomfort. No swelling over the hip or knee or ankle. Dorsal pedal pulse 2+. Monofilament exam performed the foot was completely normal.  Neurological: She is alert and oriented to person, place, and time.  Skin: Skin is warm and dry.  Psychiatric: She has a normal mood and affect. Her behavior is normal.   Gait is normal today. She says the leg aches just sitting on the table with her legs hanging down.       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 Then the buttock area this is more the location of S1. At this point in time we discussed options. She does have a history of bulging discs in her low back from years ago. She was followed by Dr. Ferd Glassing though she has not seen him in a long time. She says she has a little bit of low back pain today but it is mild. For now she can continue her Mobic daily. I offered to get x-rays and possibly an MRI since she is having neuropathy type symptoms versus going ahead and getting an orthopedist. She says she would prefer to have a referral to or that and let them do further workup. I did give her a small quantity of Vicodin to use when necessary. She had some old medication from a previous injury that she's been using at bedtime and says it has been helpful.

## 2011-11-10 NOTE — Patient Instructions (Signed)
We will call you with your referral to ortho.

## 2011-11-14 ENCOUNTER — Ambulatory Visit (INDEPENDENT_AMBULATORY_CARE_PROVIDER_SITE_OTHER): Payer: BC Managed Care – PPO | Admitting: Gynecology

## 2011-11-14 ENCOUNTER — Encounter: Payer: Self-pay | Admitting: Gynecology

## 2011-11-14 VITALS — BP 120/78

## 2011-11-14 DIAGNOSIS — N952 Postmenopausal atrophic vaginitis: Secondary | ICD-10-CM | POA: Insufficient documentation

## 2011-11-14 DIAGNOSIS — N898 Other specified noninflammatory disorders of vagina: Secondary | ICD-10-CM

## 2011-11-14 DIAGNOSIS — R350 Frequency of micturition: Secondary | ICD-10-CM

## 2011-11-14 LAB — WET PREP FOR TRICH, YEAST, CLUE
Clue Cells Wet Prep HPF POC: NONE SEEN
Yeast Wet Prep HPF POC: NONE SEEN

## 2011-11-14 LAB — URINALYSIS W MICROSCOPIC + REFLEX CULTURE
Bilirubin Urine: NEGATIVE
Ketones, ur: NEGATIVE mg/dL
Nitrite: NEGATIVE
Specific Gravity, Urine: <1.005 — ABNORMAL LOW (ref 1.005–1.030)
pH: 5.5 (ref 5.0–8.0)

## 2011-11-14 MED ORDER — CLINDAMYCIN PHOSPHATE 2 % VA CREA: 1.0000 | TOPICAL_CREAM | Freq: Every day | VAGINAL | Status: AC

## 2011-11-14 NOTE — Progress Notes (Signed)
Patient is a 61 year old who presented to the office today complaining of frequent urination and slight pelvic discomfort. She states that she douches once to twice a month and yesterday notice a brown discharge when she pulled the applicator out. Patient is not sexually active and is on no hormone replacement therapy. She denies any fever chills nausea vomiting her back pain. Patient stated that in April of this year she was in the hospital for cholecystectomy.  Exam: Bartholin urethra Skene glands: Atrophic changes Vagina: Atrophic changes slightly friable mucosa Cervix: No lesions or discharge Uterus: Axial normal size shape and consistency nontender Adnexa: No palpable masses or tenderness Rectal: Not examined  Wet prep demonstrated too numerous to count bacteria and a few white blood cells  Assessment/plan: Patient was counseled as to the detrimental effects of frequent douching which may contribute to altering vaginal pH and contributing to yeast and BV. She had tried in the past the Vagifem for vaginal atrophy but stated that she had a reaction to it and is currently not using any form of hormone replacement therapy. I'm going to place her on Cleocin vaginal cream to apply each bedtime for 5 days. She may want to try either Luvena or refresh a probiotic gel twice a week. Her urinalysis today was negative but because of her symptoms of frequency urine cultures pending at time of this dictation.

## 2011-11-14 NOTE — Patient Instructions (Addendum)
Please stop douching. This will alter your vaginal pH and make you susceptible to yeast and bacterial vaginal infections. You may want to purchase either one of these two products (Luvena or Rephresh) which are Probiotic vaginal jells and you can apply it twice a week befoer bedtime. Will call you if your urine culture shows any sign of infection.  Bacterial Vaginosis Bacterial vaginosis (BV) is a vaginal infection where the normal balance of bacteria in the vagina is disrupted. The normal balance is then replaced by an overgrowth of certain bacteria. There are several different kinds of bacteria that can cause BV. BV is the most common vaginal infection in women of childbearing age. CAUSES   The cause of BV is not fully understood. BV develops when there is an increase or imbalance of harmful bacteria.   Some activities or behaviors can upset the normal balance of bacteria in the vagina and put women at increased risk including:   Having a new sex partner or multiple sex partners.   Douching.   Using an intrauterine device (IUD) for contraception.   It is not clear what role sexual activity plays in the development of BV. However, women that have never had sexual intercourse are rarely infected with BV.  Women do not get BV from toilet seats, bedding, swimming pools or from touching objects around them.  SYMPTOMS   Grey vaginal discharge.   A fish-like odor with discharge, especially after sexual intercourse.   Itching or burning of the vagina and vulva.   Burning or pain with urination.   Some women have no signs or symptoms at all.  DIAGNOSIS  Your caregiver must examine the vagina for signs of BV. Your caregiver will perform lab tests and look at the sample of vaginal fluid through a microscope. They will look for bacteria and abnormal cells (clue cells), a pH test higher than 4.5, and a positive amine test all associated with BV.  RISKS AND COMPLICATIONS   Pelvic inflammatory  disease (PID).   Infections following gynecology surgery.   Developing HIV.   Developing herpes virus.  TREATMENT  Sometimes BV will clear up without treatment. However, all women with symptoms of BV should be treated to avoid complications, especially if gynecology surgery is planned. Female partners generally do not need to be treated. However, BV may spread between female sex partners so treatment is helpful in preventing a recurrence of BV.   BV may be treated with antibiotics. The antibiotics come in either pill or vaginal cream forms. Either can be used with nonpregnant or pregnant women, but the recommended dosages differ. These antibiotics are not harmful to the baby.   BV can recur after treatment. If this happens, a second round of antibiotics will often be prescribed.   Treatment is important for pregnant women. If not treated, BV can cause a premature delivery, especially for a pregnant woman who had a premature birth in the past. All pregnant women who have symptoms of BV should be checked and treated.   For chronic reoccurrence of BV, treatment with a type of prescribed gel vaginally twice a week is helpful.  HOME CARE INSTRUCTIONS   Finish all medication as directed by your caregiver.   Do not have sex until treatment is completed.   Tell your sexual partner that you have a vaginal infection. They should see their caregiver and be treated if they have problems, such as a mild rash or itching.   Practice safe sex. Use condoms. Only  have 1 sex partner.  PREVENTION  Basic prevention steps can help reduce the risk of upsetting the natural balance of bacteria in the vagina and developing BV:  Do not have sexual intercourse (be abstinent).   Do not douche.   Use all of the medicine prescribed for treatment of BV, even if the signs and symptoms go away.   Tell your sex partner if you have BV. That way, they can be treated, if needed, to prevent reoccurrence.  SEEK MEDICAL  CARE IF:   Your symptoms are not improving after 3 days of treatment.   You have increased discharge, pain, or fever.  MAKE SURE YOU:   Understand these instructions.   Will watch your condition.   Will get help right away if you are not doing well or get worse.  FOR MORE INFORMATION  Division of STD Prevention (DSTDP), Centers for Disease Control and Prevention: [employer] American Social Health Association (ASHA): www.ashastd.org  Document Released: 06/12/2005 Document Revised: 06/01/2011 Document Reviewed: 12/03/2008 [facility] Patient Information 2012 [place], [place].

## 2011-11-16 LAB — URINE CULTURE: Organism ID, Bacteria: NO GROWTH

## 2011-11-21 ENCOUNTER — Other Ambulatory Visit: Payer: Self-pay | Admitting: Family Medicine

## 2011-11-24 ENCOUNTER — Ambulatory Visit: Payer: BC Managed Care – PPO

## 2011-12-04 ENCOUNTER — Encounter: Payer: Self-pay | Admitting: Family Medicine

## 2011-12-04 LAB — HM COLONOSCOPY

## 2011-12-06 ENCOUNTER — Encounter: Payer: Self-pay | Admitting: *Deleted

## 2011-12-08 ENCOUNTER — Ambulatory Visit
Admission: RE | Admit: 2011-12-08 | Discharge: 2011-12-08 | Disposition: A | Discharge status: Home / Self Care | Payer: BC Managed Care – PPO | Source: Ambulatory Visit | Attending: Gynecology | Admitting: Gynecology

## 2011-12-08 DIAGNOSIS — Z1231 Encounter for screening mammogram for malignant neoplasm of breast: Secondary | ICD-10-CM

## 2012-01-05 ENCOUNTER — Other Ambulatory Visit: Payer: Self-pay | Admitting: Family Medicine

## 2012-01-22 ENCOUNTER — Ambulatory Visit (INDEPENDENT_AMBULATORY_CARE_PROVIDER_SITE_OTHER): Payer: BC Managed Care – PPO | Admitting: Women's Health

## 2012-01-22 ENCOUNTER — Encounter: Payer: Self-pay | Admitting: Women's Health

## 2012-01-22 DIAGNOSIS — R350 Frequency of micturition: Secondary | ICD-10-CM

## 2012-01-22 DIAGNOSIS — N390 Urinary tract infection, site not specified: Secondary | ICD-10-CM

## 2012-01-22 LAB — URINALYSIS W MICROSCOPIC + REFLEX CULTURE
Crystals: NONE SEEN
Ketones, ur: NEGATIVE mg/dL
Nitrite: NEGATIVE
Protein, ur: NEGATIVE mg/dL
Specific Gravity, Urine: <1.005 — ABNORMAL LOW (ref 1.005–1.030)
Urobilinogen, UA: 0.2 mg/dL (ref 0.0–1.0)

## 2012-01-22 MED ORDER — NITROFURANTOIN MONOHYD MACRO 100 MG PO CAPS: 100.0000 mg | ORAL_CAPSULE | Freq: Two times a day (BID) | ORAL | Status: AC

## 2012-01-22 NOTE — Progress Notes (Signed)
Patient ID: Anne [name], female   DOB: 1951-04-05, 61 y.o.   MRN: [id number] Presents with a complaint of increased urinary frequency, urgency and discomfort at the end of stream of urination. Denies any vaginal discharge. Had a GI bug with nausea/ vomiting/diarrhea last week, urinary symptoms symptoms started 2 days ago. Had cholecystectomy several months ago.  Exam: No CVAT, abdomen soft nontender. UA: WBCs-TNTC, RBCs- TNTC, moderate leukocytes.  UTI  Plan: Macrobid 1 by mouth twice a day for 7 days #14, instructed to take with food. Instructed to call if no relief of symptoms. Urine culture pending.

## 2012-01-22 NOTE — Patient Instructions (Signed)
rinary Tract Infection Infections of the urinary tract can start in several places. A bladder infection (cystitis), a kidney infection (pyelonephritis), and a prostate infection (prostatitis) are different types of urinary tract infections (UTIs). They usually get better if treated with medicines (antibiotics) that kill germs. Take all the medicine until it is gone. You or your child may feel better in a few days, but TAKE ALL MEDICINE or the infection may not respond and may become more difficult to treat. HOME CARE INSTRUCTIONS   Drink enough water and fluids to keep the urine clear or pale yellow. Cranberry juice is especially recommended, in addition to large amounts of water.   Avoid caffeine, tea, and carbonated beverages. They tend to irritate the bladder.   Alcohol may irritate the prostate.   Only take over-the-counter or prescription medicines for pain, discomfort, or fever as directed by your caregiver.  To prevent further infections:  Empty the bladder often. Avoid holding urine for long periods of time.   After a bowel movement, women should cleanse from front to back. Use each tissue only once.   Empty the bladder before and after sexual intercourse.  FINDING OUT THE RESULTS OF YOUR TEST Not all test results are available during your visit. If your or your child's test results are not back during the visit, make an appointment with your caregiver to find out the results. Do not assume everything is normal if you have not heard from your caregiver or the medical facility. It is important for you to follow up on all test results. SEEK MEDICAL CARE IF:   There is back pain.   Your baby is older than 3 months with a rectal temperature of 100.5 F (38.1 C) or higher for more than 1 day.   Your or your child's problems (symptoms) are no better in 3 days. Return sooner if you or your child is getting worse.  SEEK IMMEDIATE MEDICAL CARE IF:   There is severe back pain or lower  abdominal pain.   You or your child develops chills.   You have a fever.   Your baby is older than 3 months with a rectal temperature of 102 F (38.9 C) or higher.   Your baby is 75 months old or younger with a rectal temperature of 100.4 F (38 C) or higher.   There is nausea or vomiting.   There is continued burning or discomfort with urination.  MAKE SURE YOU:   Understand these instructions.   Will watch your condition.   Will get help right away if you are not doing well or get worse.  Document Released: 03/22/2005 Document Revised: 06/01/2011 Document Reviewed: 10/25/2006 [facility] Patient Information 2012 [place], [place].

## 2012-01-25 LAB — URINE CULTURE

## 2012-04-09 ENCOUNTER — Other Ambulatory Visit: Payer: Self-pay | Admitting: Family Medicine

## 2012-04-26 ENCOUNTER — Encounter: Payer: Self-pay | Admitting: Gynecology

## 2012-04-26 ENCOUNTER — Other Ambulatory Visit (HOSPITAL_COMMUNITY)
Admission: RE | Admit: 2012-04-26 | Discharge: 2012-04-26 | Disposition: A | Discharge status: Home / Self Care | Payer: BC Managed Care – PPO | Source: Ambulatory Visit | Attending: Gynecology | Admitting: Gynecology

## 2012-04-26 ENCOUNTER — Ambulatory Visit (INDEPENDENT_AMBULATORY_CARE_PROVIDER_SITE_OTHER): Payer: BC Managed Care – PPO | Admitting: Gynecology

## 2012-04-26 VITALS — BP 124/76 | Ht 65.0 in | Wt 156.0 lb

## 2012-04-26 DIAGNOSIS — Z01419 Encounter for gynecological examination (general) (routine) without abnormal findings: Secondary | ICD-10-CM | POA: Insufficient documentation

## 2012-04-26 DIAGNOSIS — M949 Disorder of cartilage, unspecified: Secondary | ICD-10-CM

## 2012-04-26 DIAGNOSIS — N898 Other specified noninflammatory disorders of vagina: Secondary | ICD-10-CM

## 2012-04-26 DIAGNOSIS — M899 Disorder of bone, unspecified: Secondary | ICD-10-CM

## 2012-04-26 DIAGNOSIS — N952 Postmenopausal atrophic vaginitis: Secondary | ICD-10-CM

## 2012-04-26 DIAGNOSIS — M8580 Other specified disorders of bone density and structure, unspecified site: Secondary | ICD-10-CM

## 2012-04-26 LAB — WET PREP FOR TRICH, YEAST, CLUE
Clue Cells Wet Prep HPF POC: NONE SEEN
Trich, Wet Prep: NONE SEEN
Yeast Wet Prep HPF POC: NONE SEEN

## 2012-04-26 MED ORDER — ESTRADIOL 0.1 MG/GM VA CREA: 2.0000 g | TOPICAL_CREAM | VAGINAL | Status: DC | PRN

## 2012-04-26 MED ORDER — METRONIDAZOLE 500 MG PO TABS: 500.0000 mg | ORAL_TABLET | Freq: Two times a day (BID) | ORAL | Status: DC

## 2012-04-26 NOTE — Patient Instructions (Signed)
Follow up for ultrasound as scheduled 

## 2012-04-26 NOTE — Progress Notes (Signed)
Anne [name] [birth year] [id number]        61 y.o.  G1P1001 for annual exam.  Several issues noted below.  Past medical history,surgical history, medications, allergies, family history and social history were all reviewed and documented in the EPIC chart. ROS:  Was performed and pertinent positives and negatives are included in the history.  Exam: Kim assistant Filed Vitals:   04/26/12 0853  BP: 124/76  Height: 5\' 5"  (1.651 m)  Weight: 156 lb (70.761 kg)   General appearance  Normal Skin grossly normal Head/Neck normal with no cervical or supraclavicular adenopathy thyroid normal Lungs  clear Cardiac RR, without RMG Abdominal  soft, nontender, without masses, organomegaly or hernia Breasts  examined lying and sitting without masses, retractions, discharge or axillary adenopathy. Pelvic  Ext/BUS/vagina  normal with atrophic changes. Slight constriction ring upper vagina below the cervix. Felt secondary to atrophic changes  Cervix  normal atrophic Pap done  Uterus  axial, normal size, shape and contour, midline and mobile nontender   Adnexa  Without masses or tenderness    Anus and perineum  normal   Rectovaginal  normal sphincter tone without palpated masses or tenderness.    Assessment/Plan:  61 y.o. G65P1001 female for annual exam.   1. Vaginal irritation. Patient with atrophic changes. Wet prep suggests BV. Will cover with Flagyl 500 twice a day x7 days, alcohol avoidance reviewed. Patient did note a darkish discharge, not bloody brownish. Recommended baseline ultrasound for endometrial echo. Had ultrasound last year where echo was 2.0 mm. Patient will follow up for ultrasound. Assuming discharge clears with treatment and will follow. If it persists regardless she knows to represent for evaluation. 2. Atrophic vaginitis. Patient uses occasional Estrace. On questioning is only several times per month external application. She has been doing this over the last several years. She  has not sexually active due to sexual dysfunction of her husband. Finds that when she has vaginal irritation on application of Estrace seems to help. I refilled her times a year. We again discussed as in the past issues of absorption and risks of stroke heart attack DVT breast cancer endometrial stimulation and thromboembolic events. 3. Pap smear. Patient feels very strongly she wants annual Pap smears and Pap smear done today. No history of significant abnormalities in the past. I did review current screening guidelines with less frequent interval but she is not interested. 4. Mammography. Patient had June 2013. We'll continue with annual mammography. SBE monthly reviewed. 5. Colonoscopy. Colonoscopy 09/2011. We'll follow up with their recommended interval. Health maintenance. The baseline blood work done today as it's all done through her primary physician's office. Follow up for ultrasound otherwise follow up annually, sooner as needed.    Dara Lords MD, 9:45 AM 04/26/2012

## 2012-04-27 LAB — URINALYSIS W MICROSCOPIC + REFLEX CULTURE
Leukocytes, UA: NEGATIVE
Nitrite: NEGATIVE
Protein, ur: NEGATIVE mg/dL
Squamous Epithelial / LPF: NONE SEEN
Urobilinogen, UA: 0.2 mg/dL (ref 0.0–1.0)

## 2012-05-03 ENCOUNTER — Ambulatory Visit (INDEPENDENT_AMBULATORY_CARE_PROVIDER_SITE_OTHER): Payer: BC Managed Care – PPO | Admitting: Family Medicine

## 2012-05-03 ENCOUNTER — Ambulatory Visit (INDEPENDENT_AMBULATORY_CARE_PROVIDER_SITE_OTHER): Payer: BC Managed Care – PPO

## 2012-05-03 ENCOUNTER — Encounter: Payer: Self-pay | Admitting: Gynecology

## 2012-05-03 ENCOUNTER — Ambulatory Visit (INDEPENDENT_AMBULATORY_CARE_PROVIDER_SITE_OTHER): Payer: BC Managed Care – PPO | Admitting: Gynecology

## 2012-05-03 VITALS — BP 130/78 | HR 76

## 2012-05-03 DIAGNOSIS — L292 Pruritus vulvae: Secondary | ICD-10-CM

## 2012-05-03 DIAGNOSIS — N898 Other specified noninflammatory disorders of vagina: Secondary | ICD-10-CM

## 2012-05-03 DIAGNOSIS — Z23 Encounter for immunization: Secondary | ICD-10-CM

## 2012-05-03 DIAGNOSIS — N950 Postmenopausal bleeding: Secondary | ICD-10-CM

## 2012-05-03 DIAGNOSIS — Z298 Encounter for other specified prophylactic measures: Secondary | ICD-10-CM

## 2012-05-03 DIAGNOSIS — N949 Unspecified condition associated with female genital organs and menstrual cycle: Secondary | ICD-10-CM

## 2012-05-03 DIAGNOSIS — L293 Anogenital pruritus, unspecified: Secondary | ICD-10-CM

## 2012-05-03 MED ORDER — FLUCONAZOLE 150 MG PO TABS: 150.0000 mg | ORAL_TABLET | Freq: Once | ORAL | Status: DC

## 2012-05-03 NOTE — Progress Notes (Signed)
Patient presents in follow up ultrasound. Recently seen and treated for vaginitis. Had some dark brown staining and was asked to schedule ultrasound for endometrial echo  assessment.  Ultrasound shows uterus normal size and echotexture. Endometrial echo 2.3 mm. Left ovary normal, right adnexa negative ( status post RSO), cul-de-sac negative.  Assessment and plan: History of some dark discharge secondary to bacterial vaginosis. Ultrasound with an endometrial echo. We'll continue to monitor. Does note some itching and the tendency to develop yeast after treatment for bacterial vaginosis. I did prescribe Diflucan 150 mg #2 one by mouth now with back up as needed.

## 2012-05-03 NOTE — Patient Instructions (Signed)
Take Diflucan now. Use second pill as needed.

## 2012-05-03 NOTE — Progress Notes (Signed)
  Subjective:    Patient ID: Anne [name], female    DOB: 10-30-1950, 61 y.o.   MRN: [id number] Flu Injection given. HPI    Review of Systems     Objective:   Physical Exam        Assessment & Plan:

## 2012-05-16 ENCOUNTER — Other Ambulatory Visit: Payer: Self-pay | Admitting: Family Medicine

## 2012-05-24 ENCOUNTER — Other Ambulatory Visit: Payer: Self-pay | Admitting: Family Medicine

## 2012-06-04 ENCOUNTER — Other Ambulatory Visit: Payer: Self-pay | Admitting: Family Medicine

## 2012-06-26 DIAGNOSIS — M8580 Other specified disorders of bone density and structure, unspecified site: Secondary | ICD-10-CM

## 2012-06-26 HISTORY — DX: Other specified disorders of bone density and structure, unspecified site: M85.80

## 2012-08-08 ENCOUNTER — Other Ambulatory Visit: Payer: Self-pay | Admitting: Family Medicine

## 2012-08-09 ENCOUNTER — Other Ambulatory Visit: Payer: Self-pay

## 2012-08-15 ENCOUNTER — Telehealth: Payer: Self-pay | Admitting: *Deleted

## 2012-08-15 DIAGNOSIS — Z0000 Encounter for general adult medical examination without abnormal findings: Secondary | ICD-10-CM

## 2012-08-15 NOTE — Telephone Encounter (Signed)
Labs ordered.

## 2012-08-16 ENCOUNTER — Encounter: Payer: Self-pay | Admitting: Family Medicine

## 2012-08-16 ENCOUNTER — Ambulatory Visit (INDEPENDENT_AMBULATORY_CARE_PROVIDER_SITE_OTHER): Payer: BC Managed Care – PPO | Admitting: Family Medicine

## 2012-08-16 VITALS — BP 108/65 | HR 73 | Ht 65.0 in | Wt 155.0 lb

## 2012-08-16 DIAGNOSIS — M48061 Spinal stenosis, lumbar region without neurogenic claudication: Secondary | ICD-10-CM | POA: Insufficient documentation

## 2012-08-16 DIAGNOSIS — Z0000 Encounter for general adult medical examination without abnormal findings: Secondary | ICD-10-CM

## 2012-08-16 NOTE — Progress Notes (Signed)
  Subjective:     Anne [name] is a 62 y.o. female and is here for a comprehensive physical exam. The patient reports problems - doing PT for lumbar spine and hip though [facility]. 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  History   Social History  . Marital Status: Married    Spouse Name: [name]    Number of Children: 1  . Years of Education: N/A   Occupational History  . Not on file.   Social History Main Topics  . Smoking status: Former Smoker    Types: Cigarettes    Quit date: 01/27/1979  . Smokeless tobacco: Never Used  . Alcohol Use: Yes     Comment: Rare  . Drug Use: No  . Sexually Active: Yes -- Female partner(s)    Birth Control/ Protection: Post-menopausal   Other Topics Concern  . Not on file   Social History Narrative   4 brothers and 6 sisters. All 4 brother deceased.  2 sisters deceased.  No regular exercise. Still working.     Health Maintenance  Topic Date Due  . Influenza Vaccine  02/24/2013  . Mammogram  12/07/2013  . Pap Smear  04/27/2015  . Colonoscopy  12/03/2016  . Tetanus/tdap  01/22/2020  . Zostavax  Completed    The following portions of the patient's history were reviewed and updated as appropriate: allergies, current medications, past family history, past medical history, past social history, past surgical history and problem list.  Review of Systems A comprehensive review of systems was negative.   Objective:    BP 108/65  Pulse 73  Ht 5\' 5"  (1.651 m)  Wt 155 lb (70.308 kg)  BMI 25.79 kg/m2  LMP 09/08/2000 General appearance: alert, cooperative and appears stated age Head: Normocephalic, without obvious abnormality, atraumatic Eyes: conj clear, EOMi, PEERLA Ears: normal TM's and external ear canals both ears Nose: Nares normal. Septum midline. Mucosa normal. No drainage or sinus tenderness. Throat: lips, mucosa, and tongue normal; teeth and gums normal Neck: no adenopathy, no carotid bruit, no JVD,  supple, symmetrical, trachea midline and thyroid not enlarged, symmetric, no tenderness/mass/nodules Back: symmetric, no curvature. ROM normal. No CVA tenderness. Lungs: clear to auscultation bilaterally Heart: regular rate and rhythm, S1, S2 normal, no murmur, click, rub or gallop Abdomen: soft, non-tender; bowel sounds normal; no masses,  no organomegaly Extremities: extremities normal, atraumatic, no cyanosis or edema Pulses: 2+ and symmetric Skin: Skin color, texture, turgor normal. No rashes or lesions Lymph nodes: Cervical and supraclavicular nodes are normal.   Neurologic: Alert and oriented X 3, normal strength and tone. Normal symmetric reflexes. Normal coordination and gait Mental status: Alert, oriented, thought content appropriate    Assessment:    Healthy female exam.      Plan:     See After Visit Summary for Counseling Recommendations  Keep up a regular exercise program and make sure you are eating a healthy diet Try to eat 4 servings of dairy a day, or if you are lactose intolerant take a calcium with vitamin D daily.  Your vaccines are up to date.

## 2012-08-16 NOTE — Patient Instructions (Addendum)
Keep up a regular exercise program and make sure you are eating a healthy diet Try to eat 4 servings of dairy a day, or if you are lactose intolerant take a calcium with vitamin D daily.  Your vaccines are up to date.   

## 2012-08-20 LAB — COMPLETE METABOLIC PANEL WITH GFR
ALT: 17 U/L (ref 0–35)
CO2: 28 mEq/L (ref 19–32)
Calcium: 9.8 mg/dL (ref 8.4–10.5)
Chloride: 105 mEq/L (ref 96–112)
Creat: 0.85 mg/dL (ref 0.50–1.10)
GFR, Est African American: 84 mL/min
Glucose, Bld: 109 mg/dL — ABNORMAL HIGH (ref 70–99)
Sodium: 141 mEq/L (ref 135–145)
Total Protein: 6.9 g/dL (ref 6.0–8.3)

## 2012-08-20 LAB — CBC
HCT: 41.5 % (ref 36.0–46.0)
MCHC: 34.2 g/dL (ref 30.0–36.0)
Platelets: 187 10*3/uL (ref 150–400)
RDW: 13.7 % (ref 11.5–15.5)

## 2012-08-20 LAB — LIPID PANEL
LDL Cholesterol: 96 mg/dL (ref 0–99)
Triglycerides: 82 mg/dL (ref <150)

## 2012-09-27 ENCOUNTER — Encounter: Payer: Self-pay | Admitting: Family Medicine

## 2012-09-27 ENCOUNTER — Ambulatory Visit (INDEPENDENT_AMBULATORY_CARE_PROVIDER_SITE_OTHER): Payer: BC Managed Care – PPO | Admitting: Family Medicine

## 2012-09-27 VITALS — BP 131/72 | HR 87 | Temp 98.2°F | Wt 156.0 lb

## 2012-09-27 DIAGNOSIS — J329 Chronic sinusitis, unspecified: Secondary | ICD-10-CM

## 2012-09-27 DIAGNOSIS — A499 Bacterial infection, unspecified: Secondary | ICD-10-CM

## 2012-09-27 MED ORDER — AZITHROMYCIN 250 MG PO TABS: ORAL_TABLET | ORAL | Status: AC

## 2012-09-27 NOTE — Progress Notes (Signed)
CC: Anne [name] is a 62 y.o. female is here for Sinusitis   Subjective: HPI:  Patient complains of facial pressure and runny nose for 2 days. Pressures localized below both eyes, moderate in severity.. nasal discharge described as moderate in severity and thick with no discoloration. Associated with subjective fevers and chills last night, sore throat, sensation of mucus draining down back of throat. She tried saltwater nasal sprays without improvement. Has been taking Allegra without any improvement. No other interventions, nothing else makes better or worse. Denies motor or sensory disturbances, ocular complaints, eye discharge, hearing loss, dizziness, headaches, sneezing, wheezing, shortness of breath, chest pain   Review Of Systems Outlined In HPI  Past Medical History  Diagnosis Date  . Disc herniation     neck and low back  . Follicular cystitis   . Hiatal hernia   . Schatzki's ring   . Osteopenia 11/2009    -1.2 AP spine  . Hyperlipidemia     ELEV CHOLESTEROL  . Thyroid disease     HYPOTHYROID  . Acid reflux      Family History  Problem Relation Age of Onset  . Stroke Sister   . Hypertension Sister   . Arthritis Sister   . Lupus Sister   . Cancer Sister   . Breast cancer Sister     Age 22  . Hyperlipidemia Brother     deceased  . Hypertension Brother   . Arthritis Daughter     Rheumatoid  . Heart attack Father 18  . Heart disease Father   . Cancer Brother 31    melanoma     History  Substance Use Topics  . Smoking status: Former Smoker    Types: Cigarettes    Quit date: 01/27/1979  . Smokeless tobacco: Never Used  . Alcohol Use: Yes     Comment: Rare     Objective: Filed Vitals:   09/27/12 1412  BP: 131/72  Pulse: 87  Temp: 98.2 F (36.8 C)    General: Alert and Oriented, No Acute Distress HEENT: Pupils equal, round, reactive to light. Conjunctivae clear.  External ears unremarkable, canals clear with intact TMs with appropriate  landmarks.  Middle ear appears open without effusion. Pink inferior turbinates.  Maxillary sinus tenderness to percussion Moist mucous membranes, pharynx without inflammation nor lesions.  Neck supple without palpable lymphadenopathy nor abnormal masses. Lungs: Clear to auscultation bilaterally, no wheezing/ronchi/rales.  Comfortable work of breathing. Good air movement. Cardiac: Regular rate and rhythm. Normal S1/S2.  No murmurs, rubs, nor gallops.   Extremities: No peripheral edema.  Strong peripheral pulses.  Mental Status: No depression, anxiety, nor agitation. Skin: Warm and dry.  Assessment & Plan: Anne [name] was seen today for sinusitis.  Diagnoses and associated orders for this visit:  Bacterial sinusitis - azithromycin (ZITHROMAX) 250 MG tablet; Take two tabs at once on day 1, then one tab daily on days 2-5.    Discussed with patient that sinusitis is most likely viral in etiology at this point. If symptoms persist 7 days strongly consider starting azithromycin. For the time being control symptoms with nasal saline washes, Alka-Seltzer cold and sinus.Signs and symptoms requring emergent/urgent reevaluation were discussed with the patient.  Return if symptoms worsen or fail to improve.

## 2012-10-16 ENCOUNTER — Other Ambulatory Visit: Payer: Self-pay | Admitting: Family Medicine

## 2012-10-16 ENCOUNTER — Encounter: Payer: Self-pay | Admitting: Family Medicine

## 2012-10-16 DIAGNOSIS — K222 Esophageal obstruction: Secondary | ICD-10-CM | POA: Insufficient documentation

## 2012-11-05 ENCOUNTER — Other Ambulatory Visit: Payer: Self-pay | Admitting: Family Medicine

## 2012-11-11 ENCOUNTER — Other Ambulatory Visit: Payer: Self-pay

## 2012-11-11 DIAGNOSIS — Z1231 Encounter for screening mammogram for malignant neoplasm of breast: Secondary | ICD-10-CM

## 2012-12-13 ENCOUNTER — Other Ambulatory Visit: Payer: Self-pay | Admitting: Family Medicine

## 2012-12-13 ENCOUNTER — Ambulatory Visit
Admission: RE | Admit: 2012-12-13 | Discharge: 2012-12-13 | Disposition: A | Discharge status: Home / Self Care | Payer: BC Managed Care – PPO | Source: Ambulatory Visit

## 2012-12-13 DIAGNOSIS — Z1231 Encounter for screening mammogram for malignant neoplasm of breast: Secondary | ICD-10-CM

## 2012-12-14 ENCOUNTER — Other Ambulatory Visit: Payer: Self-pay | Admitting: Family Medicine

## 2012-12-20 ENCOUNTER — Encounter: Payer: Self-pay | Admitting: Family Medicine

## 2012-12-20 DIAGNOSIS — Z961 Presence of intraocular lens: Secondary | ICD-10-CM | POA: Insufficient documentation

## 2012-12-20 HISTORY — PX: CATARACT EXTRACTION W/ INTRAOCULAR LENS IMPLANT: SHX1309

## 2013-01-02 ENCOUNTER — Ambulatory Visit (INDEPENDENT_AMBULATORY_CARE_PROVIDER_SITE_OTHER): Payer: BC Managed Care – PPO | Admitting: Gynecology

## 2013-01-02 ENCOUNTER — Encounter: Payer: Self-pay | Admitting: Gynecology

## 2013-01-02 DIAGNOSIS — L293 Anogenital pruritus, unspecified: Secondary | ICD-10-CM

## 2013-01-02 DIAGNOSIS — R350 Frequency of micturition: Secondary | ICD-10-CM

## 2013-01-02 DIAGNOSIS — B373 Candidiasis of vulva and vagina: Secondary | ICD-10-CM

## 2013-01-02 DIAGNOSIS — R109 Unspecified abdominal pain: Secondary | ICD-10-CM

## 2013-01-02 DIAGNOSIS — N898 Other specified noninflammatory disorders of vagina: Secondary | ICD-10-CM

## 2013-01-02 LAB — WET PREP FOR TRICH, YEAST, CLUE
Clue Cells Wet Prep HPF POC: NONE SEEN
Trich, Wet Prep: NONE SEEN

## 2013-01-02 LAB — URINALYSIS W MICROSCOPIC + REFLEX CULTURE
Hgb urine dipstick: NEGATIVE
Leukocytes, UA: NEGATIVE
Nitrite: NEGATIVE
Specific Gravity, Urine: 1.01 (ref 1.005–1.030)
pH: 7 (ref 5.0–8.0)

## 2013-01-02 MED ORDER — FLUCONAZOLE 150 MG PO TABS: 150.0000 mg | ORAL_TABLET | Freq: Once | ORAL | Status: DC

## 2013-01-02 MED ORDER — CIPROFLOXACIN HCL 250 MG PO TABS: 250.0000 mg | ORAL_TABLET | Freq: Two times a day (BID) | ORAL | Status: DC

## 2013-01-02 NOTE — Patient Instructions (Signed)
Take ciprofloxacin antibiotic twice daily for 7 days. Take the Diflucan pill once now and repeat the second pill at the end of the ciprofloxacin course. If her pain and symptoms would persist, worsen or recur then I recommend following up with your primary physician for evaluation of non-GYN related problems.

## 2013-01-02 NOTE — Progress Notes (Signed)
Patient presents with several day history of frequency, urgency low back pain and pelvic pressure discomfort. No fever chills nausea vomiting. Slight diarrhea no constipation. Also some vaginal itching with irritation.  Exam with Blanca Asst. Spine straight without CVA tenderness. Abdomen soft nontender without masses guarding rebound organomegaly. Pelvic external venous vagina with atrophic changes. Slight white discharge noted. Bimanual without masses or tenderness.  Assessment and plan: Urinary tract infection type symptoms but UA is negative. Was diagnosed with diverticulosis on colonoscopy. Wonder whether she has a low-level diverticulitis. I'm going to cover her with ciprofloxacin 250 mg twice a day x7 days. If symptoms persist, worsen or recur then she is to follow up with her primary physician for further evaluation and treatment as I do not think is GYN related. Wet prep was positive for yeast. Will cover with Diflucan 150 mg now and repeat at the end of her ciprofloxacin course.

## 2013-01-06 ENCOUNTER — Encounter: Payer: Self-pay | Admitting: Family Medicine

## 2013-01-16 DIAGNOSIS — Z9849 Cataract extraction status, unspecified eye: Secondary | ICD-10-CM | POA: Insufficient documentation

## 2013-01-17 ENCOUNTER — Other Ambulatory Visit: Payer: Self-pay | Admitting: Women's Health

## 2013-01-17 ENCOUNTER — Telehealth: Payer: Self-pay | Admitting: *Deleted

## 2013-01-17 MED ORDER — TERCONAZOLE 0.8 % VA CREA: 1.0000 | TOPICAL_CREAM | Freq: Every day | VAGINAL | Status: DC

## 2013-01-17 NOTE — Telephone Encounter (Signed)
Harriett Sine said okay to send Rx and it was sent by nancy.

## 2013-01-17 NOTE — Telephone Encounter (Signed)
(  TF patient) Pt was treated in office for UTI on 01/02/13 given ciprofloxacin 250 mg twice a day x7 days & Diflucan 150 mg now and repeat at the end of her ciprofloxacin course. Pt did this and now has more yeast white discharge itching. Pt asked if another round of diflucan could be given? Please advise

## 2013-01-17 NOTE — Telephone Encounter (Signed)
Ok, may be better to use different products,: Terazol 3 one applicator at bedtime x3. If no relief office visit.

## 2013-01-17 NOTE — Telephone Encounter (Signed)
Anne [name] when I tried to send rx it said allergy/contraindication fluconazole in dextrose? Please advise

## 2013-04-06 ENCOUNTER — Other Ambulatory Visit: Payer: Self-pay | Admitting: Family Medicine

## 2013-04-08 ENCOUNTER — Other Ambulatory Visit: Payer: Self-pay | Admitting: *Deleted

## 2013-04-08 ENCOUNTER — Other Ambulatory Visit: Payer: Self-pay | Admitting: Family Medicine

## 2013-04-08 MED ORDER — MELOXICAM 15 MG PO TABS: ORAL_TABLET | ORAL | Status: DC

## 2013-04-16 ENCOUNTER — Telehealth: Payer: Self-pay | Admitting: Family Medicine

## 2013-04-16 ENCOUNTER — Encounter: Payer: Self-pay | Admitting: Family Medicine

## 2013-04-16 DIAGNOSIS — K429 Umbilical hernia without obstruction or gangrene: Secondary | ICD-10-CM | POA: Insufficient documentation

## 2013-04-16 NOTE — Telephone Encounter (Signed)
Call patient: I received a copy of her ultrasound of the abdomen report from Dr. Noelle Penner office. It looks like she does have a small fat containing hernia just above the belly button. If she's having her discomfort in that area then we may consider further evaluation with a CT scan. If she has already been contacted by his office and they are following up on this and that is fine. If not and she wants to move forward with the CT then please let me know.

## 2013-04-16 NOTE — Telephone Encounter (Signed)
Pt states Dr. Noelle Penner office has already been working on getting her in for a CT scan. She states before she has anything done she will schedule an appt with you and states to tell you thank you.  Meyer Cory, LPN

## 2013-04-28 ENCOUNTER — Encounter: Payer: Self-pay | Admitting: Family Medicine

## 2013-04-28 DIAGNOSIS — I700 Atherosclerosis of aorta: Secondary | ICD-10-CM | POA: Insufficient documentation

## 2013-04-28 HISTORY — DX: Atherosclerosis of aorta: I70.0

## 2013-04-29 ENCOUNTER — Other Ambulatory Visit: Payer: Self-pay | Admitting: Family Medicine

## 2013-05-01 ENCOUNTER — Ambulatory Visit (INDEPENDENT_AMBULATORY_CARE_PROVIDER_SITE_OTHER): Payer: BC Managed Care – PPO | Admitting: Family Medicine

## 2013-05-01 ENCOUNTER — Encounter: Payer: Self-pay | Admitting: Family Medicine

## 2013-05-01 VITALS — BP 109/68 | HR 87 | Temp 98.7°F | Wt 166.0 lb

## 2013-05-01 DIAGNOSIS — K439 Ventral hernia without obstruction or gangrene: Secondary | ICD-10-CM

## 2013-05-01 DIAGNOSIS — Z23 Encounter for immunization: Secondary | ICD-10-CM

## 2013-05-01 DIAGNOSIS — J029 Acute pharyngitis, unspecified: Secondary | ICD-10-CM

## 2013-05-01 NOTE — Progress Notes (Signed)
Subjective:    Patient ID: Anne [name], female    DOB: Jul 11, 1950, 62 y.o.   MRN: [id number]  HPI REcently dx w/ supraumbilical hernia on [place] ordered by her Urologist. Had noticed a knot near the epigastrum on and off x 2 months. Had GB removed 12/2011. She says something hasn't felt right in that area ever since then. Has noticed it more recently and has felt tender there.  She says even when she leans up against a countertop at home she notices a discomfort. She has not had any nausea or vomiting or change in bowels. No fevers. Her urologist is recommended she follow up with her primary care provider.  ST x 1 year. Seen an allegist and did immunotherapy and treatment for allergic rhinitis for mold for one year and not improvement.  Even today she says it feels sore. She also has times where she feels like food gets a little bit stuck. She did see her GI doctor who did a dilation with a small dilator and noticed a little bit of improvement but not complete improvement.   Review of Systems  BP 109/68  Pulse 87  Temp(Src) 98.7 F (37.1 C) (Oral)  Wt 166 lb (75.297 kg)  LMP 09/08/2000    Allergies  Allergen Reactions  . Amoxicillin-Pot Clavulanate     REACTION: blisters in colon  . Augmentin [Amoxicillin-Pot Clavulanate]   . Ceftriaxone Sodium     REACTION: throat swells (ROCEPHIN)  . Codeine     REACTION: nausea  . Fluconazole In Dextrose     ? INTERACTION WITH SIMVASTATIN  . Molds & Smuts   . Zofran [Ondansetron Hcl]     Headaches    Past Medical History  Diagnosis Date  . Disc herniation     neck and low back  . Follicular cystitis   . Hiatal hernia   . Schatzki's ring   . Osteopenia 11/2009    -1.2 AP spine  . Hyperlipidemia     ELEV CHOLESTEROL  . Thyroid disease     HYPOTHYROID  . Acid reflux     Past Surgical History  Procedure Laterality Date  . Thyroidectomy  1976  . Varicose veins  1972  . Cataract extraction  2011    LEFT  . Breast surgery  1997    breast tissue biopsy-benign/.BIOPSIES x 2  . Hysteroscopy    . Oophorectomy  04/2000    LAP RSO/LYSIS OF ADHESIONS  . Tubal ligation  1976  . Dilation and curettage of uterus  1996    x2 , 1999  . Laparoscopic cholecystectomy  09/28/11  . Cataract extraction  [facility]    12/20/12 - Dr. Areta Haber, OD    History   Social History  . Marital Status: Married    Spouse Name: [name]    Number of Children: 1  . Years of Education: N/A   Occupational History  . Not on file.   Social History Main Topics  . Smoking status: Former Smoker    Types: Cigarettes    Quit date: 01/27/1979  . Smokeless tobacco: Never Used  . Alcohol Use: Yes     Comment: Rare  . Drug Use: No  . Sexual Activity: Yes    Partners: Male    Birth Control/ Protection: Post-menopausal   Other Topics Concern  . Not on file   Social History Narrative   4 brothers and 6 sisters. All 4 brother deceased.  2 sisters deceased.  No regular exercise. Still  working.      Family History  Problem Relation Age of Onset  . Stroke Sister   . Hypertension Sister   . Arthritis Sister   . Lupus Sister   . Cancer Sister   . Breast cancer Sister     Age 39  . Hyperlipidemia Brother     deceased  . Hypertension Brother   . Arthritis Daughter     Rheumatoid  . Heart attack Father 41  . Heart disease Father   . Cancer Brother 61    melanoma    Outpatient Encounter Prescriptions as of 05/01/2013  Medication Sig  . aspirin 81 MG tablet Take 81 mg by mouth daily.    [name] CALCIUM PO Take by mouth.  . Cholecalciferol (VITAMIN D) 2000 UNITS tablet Take 2,000 Units by mouth daily.    [name] estradiol (ESTRACE) 0.1 MG/GM vaginal cream Place 0.25 Applicatorfuls vaginally as needed. For vaginal irritation  . HYDROcodone-acetaminophen (VICODIN) 5-500 MG per tablet Take 1 tablet by mouth 2 (two) times daily as needed.  [name] levothyroxine (SYNTHROID, LEVOTHROID) 75 MCG tablet Take 75 mcg by mouth daily.    . meloxicam (MOBIC) 15 MG  tablet TAKE 1 TABLET (15 MG TOTAL) BY MOUTH DAILY.  . metoprolol tartrate (LOPRESSOR) 25 MG tablet TAKE 1 TABLET (25 MG TOTAL) BY MOUTH DAILY.  . mometasone (NASONEX) 50 MCG/ACT nasal spray Place 2 sprays into the nose daily.  . Multiple Vitamin (MULTIVITAMIN) tablet Take 1 tablet by mouth daily.    [name] omeprazole (PRILOSEC) 40 MG capsule TAKE 1 CAPSULE (40 MG TOTAL) BY MOUTH DAILY.  . simvastatin (ZOCOR) 40 MG tablet TAKE 1 TABLET (40 MG TOTAL) BY MOUTH EVERY EVENING.  . [DISCONTINUED] ciprofloxacin (CIPRO) 250 MG tablet Take 1 tablet (250 mg total) by mouth 2 (two) times daily. For 7 days  . [DISCONTINUED] fluconazole (DIFLUCAN) 150 MG tablet Take 1 tablet (150 mg total) by mouth once. As needed for yeast  . [DISCONTINUED] terconazole (TERAZOL 3) 0.8 % vaginal cream Place 1 applicator vaginally at bedtime.          Objective:   Physical Exam  Constitutional: She is oriented to person, place, and time. She appears well-developed and well-nourished.  HENT:  Head: Normocephalic and atraumatic.  Mouth/Throat: Oropharynx is clear and moist.  Abdominal: Soft. Bowel sounds are normal. She exhibits no distension and no mass. There is tenderness. There is no rebound and no guarding.  She definitely has a tenderness of the abdominal wall above the umbilicus that is fairly large. It is also tender. I also feel a little bit of a defect in the surgical incision near the epigastrium  Neurological: She is alert and oriented to person, place, and time.  Skin: Skin is warm and dry.  Psychiatric: She has a normal mood and affect. Her behavior is normal.          Assessment & Plan:  Supraumbilical fat containing hernia.  -Recommend surgical consult for further evaluation discussion. Dr. Luster Landsberg in [place] actually did her gallbladder removal so we will refer her back to him. It that way he can discuss the risks and benefits and potential for recovery time. I did encourage her to think about  repairs but she said she is getting some discomfort and soreness with the area. I do not think she has a complete defect in the wall some incarceration is less of a risk.  Sore throat-she has not had any improvement with treatment for allergic rhinitis or even  immunotherapy. She did see ENT initially, before starting allergy treatment, and they felt that her exam was normal. She is also seen GI without any problems or significant changes in her symptoms. At this point I would like to consider referring her to [facility] for further evaluation.

## 2013-05-02 ENCOUNTER — Encounter: Payer: BC Managed Care – PPO | Admitting: Gynecology

## 2013-05-16 ENCOUNTER — Encounter: Payer: Self-pay | Admitting: Gynecology

## 2013-05-16 ENCOUNTER — Ambulatory Visit (INDEPENDENT_AMBULATORY_CARE_PROVIDER_SITE_OTHER): Payer: BC Managed Care – PPO | Admitting: Gynecology

## 2013-05-16 ENCOUNTER — Other Ambulatory Visit (HOSPITAL_COMMUNITY)
Admission: RE | Admit: 2013-05-16 | Discharge: 2013-05-16 | Disposition: A | Discharge status: Home / Self Care | Payer: BC Managed Care – PPO | Source: Ambulatory Visit | Attending: Gynecology | Admitting: Gynecology

## 2013-05-16 VITALS — BP 120/76 | Ht 65.0 in | Wt 167.0 lb

## 2013-05-16 DIAGNOSIS — Z01419 Encounter for gynecological examination (general) (routine) without abnormal findings: Secondary | ICD-10-CM

## 2013-05-16 DIAGNOSIS — M8580 Other specified disorders of bone density and structure, unspecified site: Secondary | ICD-10-CM

## 2013-05-16 DIAGNOSIS — M899 Disorder of bone, unspecified: Secondary | ICD-10-CM

## 2013-05-16 DIAGNOSIS — N6089 Other benign mammary dysplasias of unspecified breast: Secondary | ICD-10-CM

## 2013-05-16 DIAGNOSIS — N6082 Other benign mammary dysplasias of left breast: Secondary | ICD-10-CM

## 2013-05-16 DIAGNOSIS — N952 Postmenopausal atrophic vaginitis: Secondary | ICD-10-CM

## 2013-05-16 MED ORDER — ESTRADIOL 0.1 MG/GM VA CREA: 2.0000 g | TOPICAL_CREAM | VAGINAL | Status: DC | PRN

## 2013-05-16 NOTE — Patient Instructions (Signed)
Followup for bone density as scheduled. Followup in one year for annual exam 

## 2013-05-16 NOTE — Progress Notes (Signed)
This is well below one year if Anne [name] October 18, 1950 [id number]        62 y.o.  G1P1001 for annual exam.  Several issues noted below.  Past medical history,surgical history, problem list, medications, allergies, family history and social history were all reviewed and documented in the EPIC chart.  ROS:  Performed and pertinent positives and negatives are included in the history, assessment and plan .  Exam: Kim assistant Filed Vitals:   05/16/13 1042  BP: 120/76  Height: 5\' 5"  (1.651 m)  Weight: 167 lb (75.751 kg)   General appearance  Normal Skin grossly normal Head/Neck normal with no cervical or supraclavicular adenopathy thyroid normal Lungs  clear Cardiac RR, without RMG Abdominal  soft, nontender, without masses, organomegaly or hernia Breasts  examined lying and sitting without masses, retractions, discharge or axillary adenopathy. Small classic sebaceous cyst left periphery of breast 11:00 position. Pelvic  Ext/BUS/vagina  with atrophic changes  Cervix  with atrophic changes, Pap done  Uterus  anteverted, normal size, shape and contour, midline and mobile nontender   Adnexa  Without masses or tenderness    Anus and perineum  normal   Rectovaginal  normal sphincter tone without palpated masses or tenderness.    Assessment/Plan:  62 y.o. G11P1001 female for annual exam.   1. Postmenopausal/atrophic genital changes. Patient uses Estrace vaginal cream intermittently as needed for vaginal dryness. I again reviewed the risks to include absorption risks with thrombosis breast cancer endometrial stimulation. Patient understands and accepts. No history of vaginal bleeding. Doing well otherwise as far as no significant hot flushes or night sweats. Patient knows to report any vaginal bleeding. I refilled her Estrace cream. 2. Sebaceous cyst left breast. Classic in appearance. Has been present for years. Mammography 11/2012. Continue with annual mammography. SBE monthly  reviewed. 3. Osteopenia. DEXA 2011 with T score -1.2. Recommend repeat DEXA now a 3 year interval. Patient agrees to arrange. Increase calcium vitamin D reviewed. 4. Pap smear 2013. Pap done today. I reviewed current screening guidelines. She has no history of abnormal Pap smears. She is very fearful about extending the interval and requests yearly Pap smears. 5. Colonoscopy 2013. Repeat at their recommended interval. 6. Health maintenance. No routine blood work done as this is done through her primary physician's office. Followup one year, sooner as needed.   Note: This document was prepared with digital dictation and possible smart phrase technology. Any transcriptional errors that result from this process are unintentional.   Dara Lords MD, 11:18 AM 05/16/2013

## 2013-05-17 LAB — URINALYSIS W MICROSCOPIC + REFLEX CULTURE
Bacteria, UA: NONE SEEN
Bilirubin Urine: NEGATIVE
Casts: NONE SEEN
Ketones, ur: NEGATIVE mg/dL
Protein, ur: NEGATIVE mg/dL
Specific Gravity, Urine: 1.007 (ref 1.005–1.030)
Squamous Epithelial / LPF: NONE SEEN
Urobilinogen, UA: 0.2 mg/dL (ref 0.0–1.0)

## 2013-05-19 ENCOUNTER — Telehealth: Payer: Self-pay | Admitting: Family Medicine

## 2013-05-19 DIAGNOSIS — M8580 Other specified disorders of bone density and structure, unspecified site: Secondary | ICD-10-CM

## 2013-05-19 NOTE — Telephone Encounter (Signed)
Patient wanted to know if you could put in an order for her to get a bone density done.  Please advise when done and i will have radiology contact her for appt.  thanks

## 2013-05-19 NOTE — Telephone Encounter (Signed)
Placed order. There was already one in there.

## 2013-05-30 ENCOUNTER — Ambulatory Visit (INDEPENDENT_AMBULATORY_CARE_PROVIDER_SITE_OTHER): Payer: BC Managed Care – PPO

## 2013-05-30 DIAGNOSIS — M899 Disorder of bone, unspecified: Secondary | ICD-10-CM

## 2013-05-30 DIAGNOSIS — M8580 Other specified disorders of bone density and structure, unspecified site: Secondary | ICD-10-CM

## 2013-06-01 ENCOUNTER — Other Ambulatory Visit: Payer: Self-pay | Admitting: Family Medicine

## 2013-06-08 ENCOUNTER — Other Ambulatory Visit: Payer: Self-pay | Admitting: Family Medicine

## 2013-06-13 ENCOUNTER — Telehealth: Payer: Self-pay | Admitting: *Deleted

## 2013-06-13 NOTE — Telephone Encounter (Signed)
Pt informed with normal with pap results on 05/16/13.

## 2013-06-23 HISTORY — PX: VENTRAL HERNIA REPAIR: SHX424

## 2013-07-03 ENCOUNTER — Other Ambulatory Visit: Payer: Self-pay | Admitting: Family Medicine

## 2013-08-24 ENCOUNTER — Other Ambulatory Visit: Payer: Self-pay | Admitting: Family Medicine

## 2013-09-24 ENCOUNTER — Other Ambulatory Visit: Payer: Self-pay | Admitting: Family Medicine

## 2013-09-29 ENCOUNTER — Encounter: Payer: Self-pay | Admitting: Family Medicine

## 2013-09-29 ENCOUNTER — Ambulatory Visit (INDEPENDENT_AMBULATORY_CARE_PROVIDER_SITE_OTHER): Payer: BC Managed Care – PPO | Admitting: Family Medicine

## 2013-09-29 VITALS — BP 124/69 | HR 79 | Ht 65.0 in | Wt 162.0 lb

## 2013-09-29 DIAGNOSIS — R21 Rash and other nonspecific skin eruption: Secondary | ICD-10-CM | POA: Diagnosis not present

## 2013-09-29 MED ORDER — MUPIROCIN 2 % EX OINT: TOPICAL_OINTMENT | Freq: Two times a day (BID) | CUTANEOUS | Status: DC

## 2013-09-29 NOTE — Progress Notes (Signed)
   Subjective:    Patient ID: Anne [name], female    DOB: 08-09-1950, 63 y.o.   MRN: [id number]  HPI  He had a ventral hernia repair in December with Dr. Franz Dell. Everything went fairly well. Anne [name] does have a mesh in place. Soon afterward Anne [name] noticed itching over the incision site and wanted to have her wean checked. Also some redness. Dr. Nash Mantis have recommended topical hydrocortisone cream over-the-counter. Anne [name] has been using it daily since then and has not really noticed any significant improvement. Anne [name] says the rash will get a little bit better for a few days and then seemed to come back. Anne [name] says it's still itchy. Anne [name] has never noticed any swelling or drainage from the wound. Anne [name] has changed pants and has even been putting a handkerchief in her pants to avoid any friction or rubbing of clothing that might be causing some irritation.   Review of Systems     Objective:   Physical Exam  Constitutional: Anne [name] appears well-developed and well-nourished.  HENT:  Head: Normocephalic and atraumatic.  Abdominal:    Incision in blue is well healed. Areas in red are dry ertythematous, with some scale.   Skin: Skin is warm and dry.  Psychiatric: Anne [name] has a normal mood and affect. Her behavior is normal.          Assessment & Plan:  Rash - on surgical incision.  Since Anne [name] has had no response from a mild topical over-the-counter steroid or recommend treatment with mupirocin ointment. I did do a skin scraping for KOH today to rule out any fungal elements. We'll call her with the results once available. If Anne [name] doesn't notice significant improvement after 10 days and mupirocin ointment and recommend treatment with a stronger topical steroid. The incision itself looks well-healed. Do not see any sutures, edema et Ronney Asters. It's nontender.

## 2013-09-30 ENCOUNTER — Encounter: Payer: Self-pay | Admitting: Family Medicine

## 2013-09-30 ENCOUNTER — Telehealth: Payer: Self-pay | Admitting: *Deleted

## 2013-09-30 LAB — KOH PREP: RESULT - KOH: NONE SEEN

## 2013-09-30 NOTE — Telephone Encounter (Signed)
Message copied by Teddy Spike on Tue Sep 30, 2013  3:20 PM ------      Message from: Beatrice Lecher D      Created: Mon Sep 29, 2013  8:53 PM       Can you call her surgeon for ventral hernia repair to get copy of the surgical note. Thank you. Done by Dr. Franz Dell ------

## 2013-09-30 NOTE — Telephone Encounter (Signed)
Called Dr. Garrel Ridgel ofc requesting that pt's notes be faxed

## 2013-10-04 DIAGNOSIS — J019 Acute sinusitis, unspecified: Secondary | ICD-10-CM | POA: Diagnosis not present

## 2013-10-17 ENCOUNTER — Encounter: Payer: Self-pay | Admitting: Family Medicine

## 2013-10-17 ENCOUNTER — Ambulatory Visit (INDEPENDENT_AMBULATORY_CARE_PROVIDER_SITE_OTHER): Payer: BC Managed Care – PPO | Admitting: Family Medicine

## 2013-10-17 VITALS — BP 118/73 | HR 82 | Ht 65.0 in | Wt 161.0 lb

## 2013-10-17 DIAGNOSIS — I700 Atherosclerosis of aorta: Secondary | ICD-10-CM | POA: Diagnosis not present

## 2013-10-17 DIAGNOSIS — E079 Disorder of thyroid, unspecified: Secondary | ICD-10-CM

## 2013-10-17 DIAGNOSIS — Z23 Encounter for immunization: Secondary | ICD-10-CM

## 2013-10-17 DIAGNOSIS — E785 Hyperlipidemia, unspecified: Secondary | ICD-10-CM

## 2013-10-17 DIAGNOSIS — Z0000 Encounter for general adult medical examination without abnormal findings: Secondary | ICD-10-CM

## 2013-10-17 NOTE — Patient Instructions (Signed)
Keep up a regular exercise program and make sure you are eating a healthy diet Try to eat 4 servings of dairy a day, or if you are lactose intolerant take a calcium with vitamin D daily.  Your vaccines are up to date.   

## 2013-10-17 NOTE — Progress Notes (Signed)
Subjective:    Anne [name] is a 63 y.o. female who presents for Medicare Annual/Subsequent preventive examination.  Preventive Screening-Counseling & Management  Tobacco History  Smoking status  . Former Smoker  . Types: Cigarettes  . Quit date: 01/27/1979  Smokeless tobacco  . Never Used     Problems Prior to Visit 1.   Current Problems (verified) Patient Active Problem List   Diagnosis Date Noted  . Aortic atherosclerosis 04/28/2013  . Umbilical hernia [id number]  . Schatzki's ring 10/16/2012  . Spinal stenosis of lumbar region 08/16/2012  . Vaginal atrophy 11/14/2011  . Disc herniation   . Osteopenia   . Hyperlipidemia   . Thyroid disease   . PERSONAL HISTORY OF ALLERGY TO LATEX 12/23/2009  . UNSPECIFIED DISEASE OF PHARYNX 12/16/2008  . FATIGUE 11/10/2008  . PALPITATIONS 11/10/2008  . Unspecified hypothyroidism 09/22/2008  . HYPERLIPIDEMIA 09/22/2008  . ALLERGIC RHINITIS 09/22/2008    Medications Prior to Visit Current Outpatient Prescriptions on File Prior to Visit  Medication Sig Dispense Refill  . aspirin 81 MG tablet Take 81 mg by mouth daily.        [name] CALCIUM PO Take by mouth.      . Cholecalciferol (VITAMIN D) 2000 UNITS tablet Take 2,000 Units by mouth daily.        [name] estradiol (ESTRACE) 0.1 MG/GM vaginal cream Place [id number] Applicatorfuls vaginally as needed. For vaginal irritation  42.5 g  2  . HYDROcodone-acetaminophen (VICODIN) 5-500 MG per tablet Take 1 tablet by mouth 2 (two) times daily as needed.  45 tablet  0  . levothyroxine (SYNTHROID, LEVOTHROID) 75 MCG tablet Take 75 mcg by mouth daily.        . meloxicam (MOBIC) 15 MG tablet TAKE 1 TABLET (15 MG TOTAL) BY MOUTH DAILY.  30 tablet  2  . metoprolol tartrate (LOPRESSOR) 25 MG tablet TAKE 1 TABLET (25 MG TOTAL) BY MOUTH DAILY.  30 tablet  0  . mometasone (NASONEX) 50 MCG/ACT nasal spray Place 2 sprays into the nose daily.      . Multiple Vitamin (MULTIVITAMIN) tablet Take 1 tablet by mouth  daily.        . mupirocin ointment (BACTROBAN) 2 % Apply topically 2 (two) times daily.  30 g  1  . omeprazole (PRILOSEC) 40 MG capsule TAKE 1 CAPSULE (40 MG TOTAL) BY MOUTH DAILY.  90 capsule  1  . simvastatin (ZOCOR) 40 MG tablet TAKE 1 TABLET (40 MG TOTAL) BY MOUTH EVERY EVENING.  30 tablet  4   No current facility-administered medications on file prior to visit.    Current Medications (verified) Current Outpatient Prescriptions  Medication Sig Dispense Refill  . aspirin 81 MG tablet Take 81 mg by mouth daily.        [name] CALCIUM PO Take by mouth.      . Cholecalciferol (VITAMIN D) 2000 UNITS tablet Take 2,000 Units by mouth daily.        [name] estradiol (ESTRACE) 0.1 MG/GM vaginal cream Place [id number] Applicatorfuls vaginally as needed. For vaginal irritation  42.5 g  2  . HYDROcodone-acetaminophen (VICODIN) 5-500 MG per tablet Take 1 tablet by mouth 2 (two) times daily as needed.  45 tablet  0  . levothyroxine (SYNTHROID, LEVOTHROID) 75 MCG tablet Take 75 mcg by mouth daily.        . meloxicam (MOBIC) 15 MG tablet TAKE 1 TABLET (15 MG TOTAL) BY MOUTH DAILY.  30 tablet  2  . metoprolol tartrate (LOPRESSOR)  25 MG tablet TAKE 1 TABLET (25 MG TOTAL) BY MOUTH DAILY.  30 tablet  0  . mometasone (NASONEX) 50 MCG/ACT nasal spray Place 2 sprays into the nose daily.      . Multiple Vitamin (MULTIVITAMIN) tablet Take 1 tablet by mouth daily.        . mupirocin ointment (BACTROBAN) 2 % Apply topically 2 (two) times daily.  30 g  1  . omeprazole (PRILOSEC) 40 MG capsule TAKE 1 CAPSULE (40 MG TOTAL) BY MOUTH DAILY.  90 capsule  1  . simvastatin (ZOCOR) 40 MG tablet TAKE 1 TABLET (40 MG TOTAL) BY MOUTH EVERY EVENING.  30 tablet  4   No current facility-administered medications for this visit.     Allergies (verified) Ceftriaxone sodium; Amoxicillin-pot clavulanate; Augmentin; Codeine; Fluconazole in dextrose; Molds & smuts; and Zofran   PAST HISTORY  Family History Family History  Problem Relation Age  of Onset  . Stroke Sister   . Hypertension Sister   . Arthritis Sister   . Lupus Sister   . Cancer Sister   . Breast cancer Sister     Age 42  . Hyperlipidemia Brother     deceased  . Hypertension Brother   . Arthritis Daughter     Rheumatoid  . Heart attack Father 82  . Heart disease Father   . Anuerysm Father   . Cancer Brother 34    melanoma    Social History History  Substance Use Topics  . Smoking status: Former Smoker    Types: Cigarettes    Quit date: 01/27/1979  . Smokeless tobacco: Never Used  . Alcohol Use: Yes     Comment: Rare     Are there smokers in your home (other than you)? No  Risk Factors Current exercise habits: walking for exercise  Dietary issues discussed: None   Cardiac risk factors: advanced age (older than 16 for men, 79 for women).  Depression Screen (Note: if answer to either of the following is "Yes", a more complete depression screening is indicated)   Over the past two weeks, have you felt down, depressed or hopeless? No  Over the past two weeks, have you felt little interest or pleasure in doing things? No  Have you lost interest or pleasure in daily life? No  Do you often feel hopeless? No  Do you cry easily over simple problems? No  Activities of Daily Living In your present state of health, do you have any difficulty performing the following activities?:  Driving? No Managing money?  No Feeding yourself? No Getting from bed to chair? No  Climbing a flight of stairs? No Preparing food and eating?: No Bathing or showering? No Getting dressed: No Getting to the toilet? No Using the toilet:No Moving around from place to place: No In the past year have you fallen or had a near fall?:No   Are you sexually active?  Yes  Do you have more than one partner?  No  Hearing Difficulties: No Do you often ask people to speak up or repeat themselves? No Do you experience ringing or noises in your ears? No Do you have difficulty  understanding soft or whispered voices? No   Do you feel that you have a problem with memory? No  Do you often misplace items? No  Do you feel safe at home?  No  Cognitive Testing  Alert? Yes  Normal Appearance?Yes  Oriented to person? Yes  Place? Yes   Time? Yes  Recall  of three objects?  Yes  Can perform simple calculations? Yes  Displays appropriate judgment?Yes  Can read the correct time from a watch face?Yes   Advanced Directives have been discussed with the patient? Yes  List the Names of Other Physician/Practitioners you currently use: 1.  Dr. Kalman Shan 2.  Dr. Donalynn Furlong 3.  Dr. Glennon Hamilton  Indicate any recent Medical Services you may have received from other than [facility] providers in the past year (date may be approximate).  Immunization History  Administered Date(s) Administered  . Influenza Split 06/02/2011, 05/03/2012  . Influenza Whole 04/16/2009  . Influenza,inj,Quad PF,36+ Mos 05/01/2013  . Td 01/21/2010  . Zoster 08/18/2011    Screening Tests Health Maintenance  Topic Date Due  . Pneumococcal Polysaccharide Vaccine Age 36 And Over  09/09/2013  . Influenza Vaccine  01/24/2014  . Mammogram  12/14/2014  . Colonoscopy  12/03/2016  . Tetanus/tdap  01/22/2020  . Zostavax  Completed    All answers were reviewed with the patient and necessary referrals were made:  METHENEY,CATHERINE, MD   10/17/2013   History reviewed: allergies, current medications, past family history, past medical history, past social history, past surgical history and problem list  Review of Systems A comprehensive review of systems was negative.    Objective:     Vision by Snellen chart: UTD at [facility] center. Will call for report.   Body mass index is 26.79 kg/(m^2). BP 118/73  Pulse 82  Ht 5\' 5"  (1.651 m)  Wt 161 lb (73.029 kg)  BMI 26.79 kg/m2  LMP 09/08/2000  BP 118/73  Pulse 82  Ht 5\' 5"  (1.651 m)  Wt 161 lb (73.029 kg)  BMI 26.79 kg/m2  LMP 09/08/2000  General  Appearance:    Alert, cooperative, no distress, appears stated age  Head:    Normocephalic, without obvious abnormality, atraumatic  Eyes:    PERRL, conjunctiva/corneas clear, EOM's intact, both eyes  Ears:    Normal TM's and external ear canals, both ears  Nose:   Nares normal, septum midline, mucosa normal, no drainage    or sinus tenderness  Throat:   Lips, mucosa, and tongue normal; teeth and gums normal  Neck:   Supple, symmetrical, trachea midline, no adenopathy;    thyroid:  no enlargement/tenderness/nodules; no carotid   bruit or JVD  Back:     Symmetric, no curvature, ROM normal, no CVA tenderness  Lungs:     Clear to auscultation bilaterally, respirations unlabored  Chest Wall:    No tenderness or deformity   Heart:    Regular rate and rhythm, S1 and S2 normal, no murmur, rub   or gallop  Breast Exam:    No tenderness, masses, or nipple abnormality.  Sebaceous cyst on the left breast.  Abdomen:     Soft, non-tender, bowel sounds active all four quadrants,    no masses, no organomegaly  Genitalia:    Not performed.   Rectal:    Not performed  Extremities:   Extremities normal, atraumatic, no cyanosis or edema  Pulses:   2+ and symmetric all extremities  Skin:   Skin color, texture, turgor normal, no rashes or lesions. She has multiple seborrheic keratoses. She has a dermatofibroma on her left upper thigh. Is a tiny cherry angiomas on her back and upper chest. Incision her abdomen is healing well. The rash is completely resolved.   Lymph nodes:   Cervical, supraclavicular, and axillary nodes normal  Neurologic:   CNII-XII intact, normal strength, sensation  and reflexes    throughout       Assessment:     Medicare Wellness Exam       Plan:     During the course of the visit the patient was educated and counseled about appropriate screening and preventive services including:    prenvar 13 given today.  EKG shows rate of 77 beats per minute, normal sinus rhythm, inverted  T wave in lead V2.  Diet review for nutrition referral? Yes ____  Not Indicated _x__   Patient Instructions (the written plan) was given to the patient.  Medicare Attestation I have personally reviewed: The patient's medical and social history Their use of alcohol, tobacco or illicit drugs Their current medications and supplements The patient's functional ability including ADLs,fall risks, home safety risks, cognitive, and hearing and visual impairment Diet and physical activities Evidence for depression or mood disorders  The patient's weight, height, BMI, and visual acuity have been recorded in the chart.  I have made referrals, counseling, and provided education to the patient based on review of the above and I have provided the patient with a written personalized care plan for preventive services.     METHENEY,CATHERINE, MD   10/17/2013

## 2013-10-18 LAB — LIPID PANEL
Cholesterol: 161 mg/dL (ref 0–200)
HDL: 51 mg/dL (ref >39)
LDL Cholesterol: 95 mg/dL (ref 0–99)
TRIGLYCERIDES: 74 mg/dL (ref <150)
Total CHOL/HDL Ratio: 3.2 Ratio
VLDL: 15 mg/dL (ref 0–40)

## 2013-10-18 LAB — COMPLETE METABOLIC PANEL WITH GFR
ALT: 17 U/L (ref 0–35)
AST: 18 U/L (ref 0–37)
Albumin: 4.4 g/dL (ref 3.5–5.2)
Alkaline Phosphatase: 65 U/L (ref 39–117)
BUN: 15 mg/dL (ref 6–23)
CO2: 30 meq/L (ref 19–32)
CREATININE: 0.87 mg/dL (ref 0.50–1.10)
Calcium: 9.3 mg/dL (ref 8.4–10.5)
Chloride: 101 mEq/L (ref 96–112)
GFR, EST AFRICAN AMERICAN: 81 mL/min
GFR, Est Non African American: 70 mL/min
GLUCOSE: 88 mg/dL (ref 70–99)
Potassium: 4.3 mEq/L (ref 3.5–5.3)
SODIUM: 140 meq/L (ref 135–145)
TOTAL PROTEIN: 6.9 g/dL (ref 6.0–8.3)
Total Bilirubin: 0.7 mg/dL (ref 0.2–1.2)

## 2013-10-18 LAB — CBC
HCT: 40.9 % (ref 36.0–46.0)
Hemoglobin: 13.9 g/dL (ref 12.0–15.0)
MCH: 28 pg (ref 26.0–34.0)
MCHC: 34 g/dL (ref 30.0–36.0)
MCV: 82.3 fL (ref 78.0–100.0)
PLATELETS: 199 10*3/uL (ref 150–400)
RBC: 4.97 MIL/uL (ref 3.87–5.11)
RDW: 14.2 % (ref 11.5–15.5)
WBC: 4.7 10*3/uL (ref 4.0–10.5)

## 2013-10-18 LAB — TSH: TSH: 3.994 u[IU]/mL (ref 0.350–4.500)

## 2013-10-21 ENCOUNTER — Encounter: Payer: Self-pay | Admitting: *Deleted

## 2013-10-29 ENCOUNTER — Other Ambulatory Visit: Payer: Self-pay | Admitting: Family Medicine

## 2013-11-24 ENCOUNTER — Other Ambulatory Visit: Payer: Self-pay | Admitting: Family Medicine

## 2013-11-28 ENCOUNTER — Other Ambulatory Visit: Payer: Self-pay

## 2013-11-28 DIAGNOSIS — Z1231 Encounter for screening mammogram for malignant neoplasm of breast: Secondary | ICD-10-CM

## 2013-12-05 ENCOUNTER — Other Ambulatory Visit: Payer: Self-pay | Admitting: Family Medicine

## 2013-12-10 ENCOUNTER — Other Ambulatory Visit: Payer: Self-pay | Admitting: Family Medicine

## 2013-12-11 ENCOUNTER — Other Ambulatory Visit: Payer: Self-pay | Admitting: Family Medicine

## 2013-12-19 ENCOUNTER — Ambulatory Visit
Admission: RE | Admit: 2013-12-19 | Discharge: 2013-12-19 | Disposition: A | Discharge status: Home / Self Care | Payer: BC Managed Care – PPO | Source: Ambulatory Visit

## 2013-12-19 DIAGNOSIS — Z1231 Encounter for screening mammogram for malignant neoplasm of breast: Secondary | ICD-10-CM

## 2014-01-09 ENCOUNTER — Encounter: Payer: Self-pay | Admitting: Family Medicine

## 2014-01-09 ENCOUNTER — Ambulatory Visit (INDEPENDENT_AMBULATORY_CARE_PROVIDER_SITE_OTHER): Payer: BC Managed Care – PPO | Admitting: Family Medicine

## 2014-01-09 ENCOUNTER — Other Ambulatory Visit: Payer: Self-pay | Admitting: Family Medicine

## 2014-01-09 VITALS — BP 118/70 | HR 84 | Temp 98.1°F | Ht 65.0 in | Wt 166.0 lb

## 2014-01-09 DIAGNOSIS — J029 Acute pharyngitis, unspecified: Secondary | ICD-10-CM | POA: Diagnosis not present

## 2014-01-09 DIAGNOSIS — K117 Disturbances of salivary secretion: Secondary | ICD-10-CM

## 2014-01-09 DIAGNOSIS — R682 Dry mouth, unspecified: Secondary | ICD-10-CM

## 2014-01-09 NOTE — Progress Notes (Signed)
   Subjective:    Patient ID: Anne [name], female    DOB: [birth year], 63 y.o.   MRN: [id number]  HPI 2 days ago she started having pain the right side of her neck. Started radiating up towards her ear. No fevers chills or sweats. No recent cold or upper respiratory infection. She does have a history of allergic rhinitis.Painful when drinks cold water. Skin feels raw.     Review of Systems     Objective:   Physical Exam  Constitutional: She is oriented to person, place, and time. She appears well-developed and well-nourished.  HENT:  Head: Normocephalic and atraumatic.  Right Ear: External ear normal.  Left Ear: External ear normal.  Nose: Nose normal.  Mouth/Throat: Oropharynx is clear and moist.  TMs and canals are clear.   Eyes: Conjunctivae and EOM are normal. Pupils are equal, round, and reactive to light.  Neck: Neck supple. No thyromegaly present.  Cardiovascular: Normal rate, regular rhythm and normal heart sounds.   Pulmonary/Chest: Effort normal and breath sounds normal. She has no wheezes.  Lymphadenopathy:    She has no cervical adenopathy.  Neurological: She is alert and oriented to person, place, and time.  Skin: Skin is warm and dry.  Psychiatric: She has a normal mood and affect.          Assessment & Plan:  Right sided neck pain. Most of her pain is concentrated between the sternomastoid and the chin and a triangular space. I did not palpate any swelling or induration to indicate abscess or infection. There is no erythema over the skin. I do not palpate any swollen lymph nodes or asymmetry from one side of the neck to the other. I gave reassurance at this point in time. I do recommend further workup with ENT. In fact actually look a little bit further back and her throat to make sure there is not something such as an erosion or lesion that might be causing her discomfort. If it certainly resolves between now and her appointment then we can always cancel. If  her pain worsens or she starts experiencing actual dysphasia or develops fever or chills and please call the office back immediately.

## 2014-01-12 LAB — SJOGRENS SYNDROME-B EXTRACTABLE NUCLEAR ANTIBODY: SSB (La) (ENA) Antibody, IgG: <1

## 2014-01-12 LAB — SJOGRENS SYNDROME-A EXTRACTABLE NUCLEAR ANTIBODY: SSA (Ro) (ENA) Antibody, IgG: <1

## 2014-01-19 ENCOUNTER — Other Ambulatory Visit: Payer: Self-pay | Admitting: Family Medicine

## 2014-01-19 ENCOUNTER — Telehealth: Payer: Self-pay | Admitting: *Deleted

## 2014-01-19 MED ORDER — AZITHROMYCIN 250 MG PO TABS: ORAL_TABLET | ORAL | Status: DC

## 2014-01-19 NOTE — Progress Notes (Signed)
Detailed msg was left on machine for Rohini to pick up antibiotic at pharmacy. Margette Fast, CMA

## 2014-01-19 NOTE — Telephone Encounter (Signed)
Anne [name] called stating that she was still having problems with her eustacian tubes and felt that they were infected again. She said that she did have an appt with an ENT without a favor outcome. Would like an antibiotic called in for this problem if you can or if she needs an appt she will make one. Margette Fast, CMA

## 2014-01-19 NOTE — Progress Notes (Unsigned)
Ok will send him an antibiotic. If she's not better by the end of the week and she will need to make an appointment to have her ears checked.

## 2014-02-22 ENCOUNTER — Other Ambulatory Visit: Payer: Self-pay | Admitting: Family Medicine

## 2014-03-02 ENCOUNTER — Other Ambulatory Visit: Payer: Self-pay | Admitting: Family Medicine

## 2014-03-03 ENCOUNTER — Other Ambulatory Visit: Payer: Self-pay

## 2014-03-03 MED ORDER — METOPROLOL TARTRATE 25 MG PO TABS: 25.0000 mg | ORAL_TABLET | Freq: Every day | ORAL | Status: DC

## 2014-03-30 ENCOUNTER — Other Ambulatory Visit: Payer: Self-pay | Admitting: Family Medicine

## 2014-04-08 ENCOUNTER — Telehealth: Payer: Self-pay | Admitting: *Deleted

## 2014-04-08 NOTE — Telephone Encounter (Signed)
Refill request came via fax for the meloxicam and I only gave her a 30 day and had her schedule an appointment since it had been a while that she was seen for this med and her wellness exam was in April. She was questioning if she needed the appt since she was told in April to follow up in 1 year. Thank you. Margette Fast, CMA

## 2014-04-08 NOTE — Telephone Encounter (Signed)
She does need followup. If she goes back and looks on her paperwork I asked her to f/u in 6 months which is this month.  She will need to schedule a f/u for medications.

## 2014-04-09 NOTE — Telephone Encounter (Signed)
Patient aware to keep appointment. Margette Fast, CMA

## 2014-04-17 ENCOUNTER — Encounter: Payer: Self-pay | Admitting: Family Medicine

## 2014-04-17 ENCOUNTER — Ambulatory Visit (INDEPENDENT_AMBULATORY_CARE_PROVIDER_SITE_OTHER): Payer: BC Managed Care – PPO | Admitting: Family Medicine

## 2014-04-17 VITALS — BP 121/68 | HR 82 | Temp 98.8°F | Ht 65.0 in | Wt 174.0 lb

## 2014-04-17 DIAGNOSIS — R002 Palpitations: Secondary | ICD-10-CM

## 2014-04-17 DIAGNOSIS — Z23 Encounter for immunization: Secondary | ICD-10-CM

## 2014-04-17 DIAGNOSIS — E039 Hypothyroidism, unspecified: Secondary | ICD-10-CM

## 2014-04-17 DIAGNOSIS — E785 Hyperlipidemia, unspecified: Secondary | ICD-10-CM | POA: Diagnosis not present

## 2014-04-17 MED ORDER — METOPROLOL TARTRATE 25 MG PO TABS: 25.0000 mg | ORAL_TABLET | Freq: Every day | ORAL | Status: DC

## 2014-04-17 MED ORDER — MELOXICAM 15 MG PO TABS: ORAL_TABLET | ORAL | Status: DC

## 2014-04-17 MED ORDER — SIMVASTATIN 40 MG PO TABS: ORAL_TABLET | ORAL | Status: DC

## 2014-04-17 MED ORDER — OMEPRAZOLE 40 MG PO CPDR: DELAYED_RELEASE_CAPSULE | ORAL | Status: DC

## 2014-04-17 NOTE — Assessment & Plan Note (Signed)
Well-controlled.  Continue current regimen. 

## 2014-04-17 NOTE — Assessment & Plan Note (Signed)
Well controlled on betablockers.

## 2014-04-17 NOTE — Assessment & Plan Note (Signed)
Followed by endocrine

## 2014-04-17 NOTE — Progress Notes (Signed)
   Subjective:    Patient ID: Anne [name], female    DOB: Feb 15, 1951, 63 y.o.   MRN: [id number]  HPI Hypothyroidism-no recent changes to her weight, skin, or hair. She's taking her medication regularly on an empty stomach before breakfast. Has gained some weight but has been eating more cookies.  She follows with endocrinology  Hyperlipidemia-Currently taking simvastatin 40mg  daily with no myalgias or S.E.   Palpitations-currently taking metoprolol. She feels like it is controlling her heart.     Review of Systems     Objective:   Physical Exam  Constitutional: She is oriented to person, place, and time. She appears well-developed and well-nourished.  HENT:  Head: Normocephalic and atraumatic.  Cardiovascular: Normal rate, regular rhythm and normal heart sounds.   Pulmonary/Chest: Effort normal and breath sounds normal.  Neurological: She is alert and oriented to person, place, and time.  Skin: Skin is warm and dry.  Psychiatric: She has a normal mood and affect. Her behavior is normal.          Assessment & Plan:  Flu vaccine given today.

## 2014-04-27 ENCOUNTER — Encounter: Payer: Self-pay | Admitting: Family Medicine

## 2014-06-05 ENCOUNTER — Encounter: Payer: BC Managed Care – PPO | Admitting: Gynecology

## 2014-06-05 ENCOUNTER — Encounter: Payer: Self-pay | Admitting: Gynecology

## 2014-06-05 ENCOUNTER — Ambulatory Visit (INDEPENDENT_AMBULATORY_CARE_PROVIDER_SITE_OTHER): Payer: BC Managed Care – PPO | Admitting: Gynecology

## 2014-06-05 ENCOUNTER — Other Ambulatory Visit (HOSPITAL_COMMUNITY)
Admission: RE | Admit: 2014-06-05 | Discharge: 2014-06-05 | Disposition: A | Discharge status: Home / Self Care | Payer: BC Managed Care – PPO | Source: Ambulatory Visit | Attending: Gynecology | Admitting: Gynecology

## 2014-06-05 VITALS — BP 120/76 | Ht 65.0 in | Wt 171.0 lb

## 2014-06-05 DIAGNOSIS — M8580 Other specified disorders of bone density and structure, unspecified site: Secondary | ICD-10-CM

## 2014-06-05 DIAGNOSIS — N898 Other specified noninflammatory disorders of vagina: Secondary | ICD-10-CM

## 2014-06-05 DIAGNOSIS — Z01419 Encounter for gynecological examination (general) (routine) without abnormal findings: Secondary | ICD-10-CM | POA: Insufficient documentation

## 2014-06-05 DIAGNOSIS — N952 Postmenopausal atrophic vaginitis: Secondary | ICD-10-CM

## 2014-06-05 DIAGNOSIS — L298 Other pruritus: Secondary | ICD-10-CM

## 2014-06-05 LAB — WET PREP FOR TRICH, YEAST, CLUE
CLUE CELLS WET PREP: NONE SEEN
Trich, Wet Prep: NONE SEEN
Yeast Wet Prep HPF POC: NONE SEEN

## 2014-06-05 MED ORDER — ESTRADIOL 0.1 MG/GM VA CREA: 2.0000 g | TOPICAL_CREAM | VAGINAL | Status: DC | PRN

## 2014-06-05 MED ORDER — FLUCONAZOLE 150 MG PO TABS: 150.0000 mg | ORAL_TABLET | Freq: Once | ORAL | Status: DC

## 2014-06-05 NOTE — Addendum Note (Signed)
Addended by: Nelva Nay on: 06/05/2014 09:21 AM   Modules accepted: Orders, SmartSet

## 2014-06-05 NOTE — Patient Instructions (Signed)
Take the Diflucan pill once for the vaginal itching. Do not take your cholesterol medicine that day.  You may obtain a copy of any labs that were done today by logging onto MyChart as outlined in the instructions provided with your AVS (after visit summary). The office will not call with normal lab results but certainly if there are any significant abnormalities then we will contact you.   Health Maintenance, Female A healthy lifestyle and preventative care can promote health and wellness.  Maintain regular health, dental, and eye exams.  Eat a healthy diet. Foods like vegetables, fruits, whole grains, low-fat dairy products, and lean protein foods contain the nutrients you need without too many calories. Decrease your intake of foods high in solid fats, added sugars, and salt. Get information about a proper diet from your caregiver, if necessary.  Regular physical exercise is one of the most important things you can do for your health. Most adults should get at least 150 minutes of moderate-intensity exercise (any activity that increases your heart rate and causes you to sweat) each week. In addition, most adults need muscle-strengthening exercises on 2 or more days a week.   Maintain a healthy weight. The body mass index (BMI) is a screening tool to identify possible weight problems. It provides an estimate of body fat based on height and weight. Your caregiver can help determine your BMI, and can help you achieve or maintain a healthy weight. For adults 20 years and older:  A BMI below 18.5 is considered underweight.  A BMI of 18.5 to 24.9 is normal.  A BMI of 25 to 29.9 is considered overweight.  A BMI of 30 and above is considered obese.  Maintain normal blood lipids and cholesterol by exercising and minimizing your intake of saturated fat. Eat a balanced diet with plenty of fruits and vegetables. Blood tests for lipids and cholesterol should begin at age 47 and be repeated every 5  years. If your lipid or cholesterol levels are high, you are over 50, or you are a high risk for heart disease, you may need your cholesterol levels checked more frequently.Ongoing high lipid and cholesterol levels should be treated with medicines if diet and exercise are not effective.  If you smoke, find out from your caregiver how to quit. If you do not use tobacco, do not start.  Lung cancer screening is recommended for adults aged 56 80 years who are at high risk for developing lung cancer because of a history of smoking. Yearly low-dose computed tomography (CT) is recommended for people who have at least a 30-pack-year history of smoking and are a current smoker or have quit within the past 15 years. A pack year of smoking is smoking an average of 1 pack of cigarettes a day for 1 year (for example: 1 pack a day for 30 years or 2 packs a day for 15 years). Yearly screening should continue until the smoker has stopped smoking for at least 15 years. Yearly screening should also be stopped for people who develop a health problem that would prevent them from having lung cancer treatment.  If you are pregnant, do not drink alcohol. If you are breastfeeding, be very cautious about drinking alcohol. If you are not pregnant and choose to drink alcohol, do not exceed 1 drink per day. One drink is considered to be 12 ounces (355 mL) of beer, 5 ounces (148 mL) of wine, or 1.5 ounces (44 mL) of liquor.  Avoid use of street  drugs. Do not share needles with anyone. Ask for help if you need support or instructions about stopping the use of drugs.  High blood pressure causes heart disease and increases the risk of stroke. Blood pressure should be checked at least every 1 to 2 years. Ongoing high blood pressure should be treated with medicines, if weight loss and exercise are not effective.  If you are 59 to 65 years old, ask your caregiver if you should take aspirin to prevent strokes.  Diabetes screening  involves taking a blood sample to check your fasting blood sugar level. This should be done once every 3 years, after age 75, if you are within normal weight and without risk factors for diabetes. Testing should be considered at a younger age or be carried out more frequently if you are overweight and have at least 1 risk factor for diabetes.  Breast cancer screening is essential preventative care for women. You should practice "breast self-awareness." This means understanding the normal appearance and feel of your breasts and may include breast self-examination. Any changes detected, no matter how small, should be reported to a caregiver. Women in their 45s and 30s should have a clinical breast exam (CBE) by a caregiver as part of a regular health exam every 1 to 3 years. After age 25, women should have a CBE every year. Starting at age 75, women should consider having a mammogram (breast X-ray) every year. Women who have a family history of breast cancer should talk to their caregiver about genetic screening. Women at a high risk of breast cancer should talk to their caregiver about having an MRI and a mammogram every year.  Breast cancer gene (BRCA)-related cancer risk assessment is recommended for women who have family members with BRCA-related cancers. BRCA-related cancers include breast, ovarian, tubal, and peritoneal cancers. Having family members with these cancers may be associated with an increased risk for harmful changes (mutations) in the breast cancer genes BRCA1 and BRCA2. Results of the assessment will determine the need for genetic counseling and BRCA1 and BRCA2 testing.  The Pap test is a screening test for cervical cancer. Women should have a Pap test starting at age 96. Between ages 37 and 12, Pap tests should be repeated every 2 years. Beginning at age 61, you should have a Pap test every 3 years as long as the past 3 Pap tests have been normal. If you had a hysterectomy for a problem that  was not cancer or a condition that could lead to cancer, then you no longer need Pap tests. If you are between ages 80 and 93, and you have had normal Pap tests going back 10 years, you no longer need Pap tests. If you have had past treatment for cervical cancer or a condition that could lead to cancer, you need Pap tests and screening for cancer for at least 20 years after your treatment. If Pap tests have been discontinued, risk factors (such as a new sexual partner) need to be reassessed to determine if screening should be resumed. Some women have medical problems that increase the chance of getting cervical cancer. In these cases, your caregiver may recommend more frequent screening and Pap tests.  The human papillomavirus (HPV) test is an additional test that may be used for cervical cancer screening. The HPV test looks for the virus that can cause the cell changes on the cervix. The cells collected during the Pap test can be tested for HPV. The HPV test could be  used to screen women aged 10 years and older, and should be used in women of any age who have unclear Pap test results. After the age of 107, women should have HPV testing at the same frequency as a Pap test.  Colorectal cancer can be detected and often prevented. Most routine colorectal cancer screening begins at the age of 43 and continues through age 60. However, your caregiver may recommend screening at an earlier age if you have risk factors for colon cancer. On a yearly basis, your caregiver may provide home test kits to check for hidden blood in the stool. Use of a small camera at the end of a tube, to directly examine the colon (sigmoidoscopy or colonoscopy), can detect the earliest forms of colorectal cancer. Talk to your caregiver about this at age 41, when routine screening begins. Direct examination of the colon should be repeated every 5 to 10 years through age 13, unless early forms of pre-cancerous polyps or small growths are  found.  Hepatitis C blood testing is recommended for all people born from [birth year] through 1967 and any individual with known risks for hepatitis C.  Practice safe sex. Use condoms and avoid high-risk sexual practices to reduce the spread of sexually transmitted infections (STIs). Sexually active women aged 79 and younger should be checked for Chlamydia, which is a common sexually transmitted infection. Older women with new or multiple partners should also be tested for Chlamydia. Testing for other STIs is recommended if you are sexually active and at increased risk.  Osteoporosis is a disease in which the bones lose minerals and strength with aging. This can result in serious bone fractures. The risk of osteoporosis can be identified using a bone density scan. Women ages 58 and over and women at risk for fractures or osteoporosis should discuss screening with their caregivers. Ask your caregiver whether you should be taking a calcium supplement or vitamin D to reduce the rate of osteoporosis.  Menopause can be associated with physical symptoms and risks. Hormone replacement therapy is available to decrease symptoms and risks. You should talk to your caregiver about whether hormone replacement therapy is right for you.  Use sunscreen. Apply sunscreen liberally and repeatedly throughout the day. You should seek shade when your shadow is shorter than you. Protect yourself by wearing long sleeves, pants, a wide-brimmed hat, and sunglasses year round, whenever you are outdoors.  Notify your caregiver of new moles or changes in moles, especially if there is a change in shape or color. Also notify your caregiver if a mole is larger than the size of a pencil eraser.  Stay current with your immunizations. Document Released: 12/26/2010 Document Revised: 10/07/2012 Document Reviewed: 12/26/2010 [facility] Patient Information 2014 [place].

## 2014-06-05 NOTE — Progress Notes (Signed)
Anne [name] 1951/05/27 [id number]        63 y.o.  G1P1001 for annual exam.  Several issues noted below.  Past medical history,surgical history, problem list, medications, allergies, family history and social history were all reviewed and documented as reviewed in the EPIC chart.  ROS:  12 system ROS performed with pertinent positives and negatives included in the history, assessment and plan.   Additional significant findings :  none   Exam: [name] Vitals:   06/05/14 0843  BP: 120/76  Height: 5\' 5"  (1.651 m)  Weight: 171 lb (77.565 kg)   General appearance:  Normal affect, orientation and appearance. Skin: Grossly normal HEENT: Without gross lesions.  No cervical or supraclavicular adenopathy. Thyroid normal.  Lungs:  Clear without wheezing, rales or rhonchi Cardiac: RR, without RMG Abdominal:  Soft, nontender, without masses, guarding, rebound, organomegaly or hernia Breasts:  Examined lying and sitting without masses, retractions, discharge or axillary adenopathy. Pelvic:  Ext/BUS/vagina with generalized atrophic changes.  Cervix atrophic flush with upper vagina. Pap done  Uterus anteverted, normal size, shape and contour, midline and mobile nontender   Adnexa  Without masses or tenderness    Anus and perineum  Normal   Rectovaginal  Normal sphincter tone without palpated masses or tenderness.    Assessment/Plan:  63 y.o. G48P1001 female for annual exam.   1. Postmenopausal/atrophic genital changes.  Patient uses Estrace intermittently to help with vaginal dryness. Does not use it consistently. Would prefer to continue doing this. Reports good results with this. No bleeding. Again reviewed issues of absorption and risks as in the past. Patient's comfortable continuing I refilled her 1 year. 2. Vaginal itching. Patient notes in the last several days vaginal itching. No significant discharge noted. Wet prep is negative. We'll treat with Diflucan 150 mg 1 dose  arbitrarily. Follow up if symptoms persist or recur. 3. Osteopenia. DEXA 2014 T score -1.6. FRAX 13%/1%. Increased calcium and vitamin D reviewed. Recommend repeat DEXA at two-year interval. Have vitamin D level checked at her primary physician's office. 4. Mammography 11/2013. Continue with annual mammography. SBE monthly reviewed. 5. Colonoscopy 2008. Repeat at their recommended interval. 6. Pap smear 2014. Pap smear today. Patient is uncomfortable with current screening guidelines for less frequent screening intervals and prefers annual cytology. 7. Health maintenance. No routine blood work done as she reports this done at her primary physician's office. Follow up in one year, sooner as needed.     [name] MD, 9:15 AM 06/05/2014

## 2014-06-06 LAB — URINALYSIS W MICROSCOPIC + REFLEX CULTURE
Bacteria, UA: NONE SEEN
Bilirubin Urine: NEGATIVE
Casts: NONE SEEN
Crystals: NONE SEEN
Glucose, UA: NEGATIVE mg/dL
Hgb urine dipstick: NEGATIVE
Ketones, ur: NEGATIVE mg/dL
Leukocytes, UA: NEGATIVE
Nitrite: NEGATIVE
Protein, ur: NEGATIVE mg/dL
Specific Gravity, Urine: 1.015 (ref 1.005–1.030)
Squamous Epithelial / HPF: NONE SEEN
Urobilinogen, UA: 0.2 mg/dL (ref 0.0–1.0)
pH: 5.5 (ref 5.0–8.0)

## 2014-06-08 LAB — CYTOLOGY - PAP

## 2014-07-31 DIAGNOSIS — E785 Hyperlipidemia, unspecified: Secondary | ICD-10-CM | POA: Diagnosis not present

## 2014-07-31 DIAGNOSIS — E0500 Thyrotoxicosis with diffuse goiter without thyrotoxic crisis or storm: Secondary | ICD-10-CM | POA: Diagnosis not present

## 2014-07-31 DIAGNOSIS — K5900 Constipation, unspecified: Secondary | ICD-10-CM | POA: Diagnosis not present

## 2014-07-31 DIAGNOSIS — E890 Postprocedural hypothyroidism: Secondary | ICD-10-CM | POA: Diagnosis not present

## 2014-08-29 ENCOUNTER — Other Ambulatory Visit: Payer: Self-pay | Admitting: Family Medicine

## 2014-10-27 ENCOUNTER — Other Ambulatory Visit: Payer: Self-pay | Admitting: Family Medicine

## 2014-11-06 ENCOUNTER — Encounter: Payer: Medicare Other | Admitting: Family Medicine

## 2014-11-11 ENCOUNTER — Emergency Department (HOSPITAL_COMMUNITY)
Admission: EM | Admit: 2014-11-11 | Discharge: 2014-11-11 | Disposition: A | Discharge status: Home / Self Care | Payer: BLUE CROSS/BLUE SHIELD | Attending: Emergency Medicine | Admitting: Emergency Medicine

## 2014-11-11 ENCOUNTER — Encounter (HOSPITAL_COMMUNITY): Payer: Self-pay | Admitting: Emergency Medicine

## 2014-11-11 DIAGNOSIS — Q394 Esophageal web: Secondary | ICD-10-CM | POA: Insufficient documentation

## 2014-11-11 DIAGNOSIS — K219 Gastro-esophageal reflux disease without esophagitis: Secondary | ICD-10-CM | POA: Diagnosis not present

## 2014-11-11 DIAGNOSIS — Z87891 Personal history of nicotine dependence: Secondary | ICD-10-CM | POA: Insufficient documentation

## 2014-11-11 DIAGNOSIS — Z8742 Personal history of other diseases of the female genital tract: Secondary | ICD-10-CM | POA: Insufficient documentation

## 2014-11-11 DIAGNOSIS — M8580 Other specified disorders of bone density and structure, unspecified site: Secondary | ICD-10-CM | POA: Diagnosis not present

## 2014-11-11 DIAGNOSIS — Z79891 Long term (current) use of opiate analgesic: Secondary | ICD-10-CM | POA: Insufficient documentation

## 2014-11-11 DIAGNOSIS — Z79899 Other long term (current) drug therapy: Secondary | ICD-10-CM | POA: Diagnosis not present

## 2014-11-11 DIAGNOSIS — Z7951 Long term (current) use of inhaled steroids: Secondary | ICD-10-CM | POA: Insufficient documentation

## 2014-11-11 DIAGNOSIS — Z7982 Long term (current) use of aspirin: Secondary | ICD-10-CM | POA: Diagnosis not present

## 2014-11-11 DIAGNOSIS — E785 Hyperlipidemia, unspecified: Secondary | ICD-10-CM | POA: Diagnosis not present

## 2014-11-11 DIAGNOSIS — E079 Disorder of thyroid, unspecified: Secondary | ICD-10-CM | POA: Diagnosis not present

## 2014-11-11 DIAGNOSIS — M5441 Lumbago with sciatica, right side: Secondary | ICD-10-CM

## 2014-11-11 DIAGNOSIS — M545 Low back pain: Secondary | ICD-10-CM | POA: Diagnosis present

## 2014-11-11 DIAGNOSIS — Z880 Allergy status to penicillin: Secondary | ICD-10-CM | POA: Diagnosis not present

## 2014-11-11 DIAGNOSIS — Z793 Long term (current) use of hormonal contraceptives: Secondary | ICD-10-CM | POA: Diagnosis not present

## 2014-11-11 MED ORDER — MORPHINE SULFATE 4 MG/ML IJ SOLN
4.0000 mg | Freq: Once | INTRAMUSCULAR | Status: AC
  Administered 2014-11-11: 4 mg via INTRAMUSCULAR
  Filled 2014-11-11: qty 1

## 2014-11-11 MED ORDER — CYCLOBENZAPRINE HCL 10 MG PO TABS: 10.0000 mg | ORAL_TABLET | Freq: Two times a day (BID) | ORAL | Status: DC | PRN

## 2014-11-11 NOTE — ED Notes (Signed)
Pt states that she has a hx of sciatica pain that had resolved but in the past week has come back. States the pain runs down her leg. Scheduled for an MRI with her PCP soon. Alert and oriented.

## 2014-11-11 NOTE — Discharge Instructions (Signed)
You have been prescribed flexeril, this is a muscle relaxer used for muscle spasms and cramps. This medication can cause drowsiness, do not drive or operate dangerous machinery while taking. Be sure to follow up with your orthopedist for further evaluation and treatment of ongoing sciatic pain. See below for further instructions.

## 2014-11-11 NOTE — ED Provider Notes (Signed)
CSN: [id number]     Arrival date & time 11/11/14  1640 History  This chart was scribed for non-physician practitioner [facility], PA-C, working with Pamella Pert, MD, by Thea Alken, ED Scribe. This patient was seen in room WTR8/WTR8 and the patient's care was started at 5:05 PM.   Chief Complaint  Patient presents with  . Back Pain    The history is provided by the patient. No language interpreter was used.   Anne [name] is a 64 y.o. female with hx of herniated disc who presents to the Emergency Department complaining of worsening low back pain that radiates to left hip and left leg onset 1 week ago. She denies falls, injury and heavy lifting. Pt states she was seen yesterday and was prescribed oxycodone without relief. She has also taken 1 week of prednisone as well as well as IM injections w/o relief. She reports scheduled MRI with PCP. Pt states she has hx of sciatic pain that had improved but believes has now returned.  Pt denies bladder incontinence.         Past Medical History  Diagnosis Date  . Disc herniation     neck and low back  . Follicular cystitis   . Hiatal hernia   . Schatzki's ring   . Osteopenia 2014    T score -1.6 FRAX 13%/1%  . Hyperlipidemia     ELEV CHOLESTEROL  . Thyroid disease     HYPOTHYROID  . Acid reflux   . Hernia    Past Surgical History  Procedure Laterality Date  . Thyroidectomy  1976  . Varicose veins  1972  . Cataract extraction  2011    LEFT  . Breast surgery  1997    breast tissue biopsy-benign/.BIOPSIES x 2  . Hysteroscopy    . Oophorectomy  04/2000    LAP RSO/LYSIS OF ADHESIONS  . Tubal ligation  1976  . Dilation and curettage of uterus  1996    x2 , 1999  . Laparoscopic cholecystectomy  09/28/11  . Cataract extraction  [facility]    12/20/12 - Dr. Haynes Bast, OD  . Ventral hernia repair  06/23/13    Dr. Franz Dell   Family History  Problem Relation Age of Onset  . Stroke Sister   . Hypertension Sister   . Arthritis  Sister   . Lupus Sister   . Cancer Sister   . Breast cancer Sister     Age 77  . Hyperlipidemia Brother     deceased  . Hypertension Brother   . Arthritis Daughter     Rheumatoid  . Heart attack Father 51  . Heart disease Father   . Anuerysm Father   . Cancer Brother 52    melanoma   History  Substance Use Topics  . Smoking status: Former Smoker    Types: Cigarettes    Quit date: 01/27/1979  . Smokeless tobacco: Never Used  . Alcohol Use: Yes     Comment: Rare   OB History    Gravida Para Term Preterm AB TAB SAB Ectopic Multiple Living   1 1 1       1      Review of Systems  Gastrointestinal: Negative for abdominal pain.  Genitourinary: Negative for enuresis and difficulty urinating.  Musculoskeletal: Positive for myalgias and back pain.   Allergies  Amoxicillin-pot clavulanate; Augmentin; Ceftriaxone sodium; Codeine; Fluconazole in dextrose; Molds & smuts; and Zofran  Home Medications   Prior to Admission medications  Medication Sig Start Date End Date Taking? Authorizing Provider  aspirin 81 MG tablet Take 81 mg by mouth daily.      Historical Provider, MD  CALCIUM PO Take by mouth.    Historical Provider, MD  Cholecalciferol (VITAMIN D) 2000 UNITS tablet Take 2,000 Units by mouth daily.      Historical Provider, MD  cyclobenzaprine (FLEXERIL) 10 MG tablet Take 1 tablet (10 mg total) by mouth 2 (two) times daily as needed for muscle spasms. 11/11/14   Noland Fordyce, PA-C  estradiol (ESTRACE) 0.1 MG/GM vaginal cream Place 6.29 Applicatorfuls vaginally as needed. For vaginal irritation 06/05/14   [name], MD  fluconazole (DIFLUCAN) 150 MG tablet Take 1 tablet (150 mg total) by mouth once. 06/05/14   [name], MD  HYDROcodone-acetaminophen (VICODIN) 5-500 MG per tablet Take 1 tablet by mouth 2 (two) times daily as needed. Patient not taking: Reported on 06/05/2014 11/10/11   [name], MD  levothyroxine (SYNTHROID, LEVOTHROID) 75 MCG  tablet Take 75 mcg by mouth daily.      Historical Provider, MD  meloxicam (MOBIC) 15 MG tablet TAKE 1 TABLET (15 MG TOTAL) BY MOUTH DAILY. 04/17/14   [name], MD  metoprolol tartrate (LOPRESSOR) 25 MG tablet TAKE 1 TABLET (25 MG TOTAL) BY MOUTH DAILY. 08/30/14   [name], MD  mometasone (NASONEX) 50 MCG/ACT nasal spray Place 2 sprays into the nose daily.    Historical Provider, MD  Multiple Vitamin (MULTIVITAMIN) tablet Take 1 tablet by mouth daily.      Historical Provider, MD  omeprazole (PRILOSEC) 40 MG capsule TAKE 1 CAPSULE (40 MG TOTAL) BY MOUTH DAILY. 10/27/14   [name], MD  simvastatin (ZOCOR) 40 MG tablet TAKE 1 TABLET (40 MG TOTAL) BY MOUTH EVERY EVENING. 04/17/14   [name], MD   BP 135/79 mmHg  Pulse 71  Temp(Src) 98.3 F (36.8 C) (Oral)  SpO2 100%  LMP 09/08/2000 Physical Exam  Constitutional: She is oriented to person, place, and time. She appears well-developed and well-nourished. She appears distressed.  Pt sitting in exam chair leaning to Left side, appears uncomfortable.   HENT:  Head: Normocephalic and atraumatic.  Eyes: Conjunctivae and EOM are normal.  Neck: Neck supple.  Cardiovascular: Normal rate.   Pulmonary/Chest: Effort normal.  Abdominal: Soft. She exhibits no distension. There is no tenderness.  Musculoskeletal: Normal range of motion. She exhibits tenderness. She exhibits no edema.  Tenderness to Right lower lumbar muscles and Right buttock. Antalgic gait. Positive straight leg raise on Right side.   Neurological: She is alert and oriented to person, place, and time.  Skin: Skin is warm and dry.  Psychiatric: She has a normal mood and affect. Her behavior is normal.  Nursing note and vitals reviewed.   ED Course  Procedures (including critical care time) DIAGNOSTIC STUDIES: Oxygen Saturation is 100% on RA, normal by my interpretation.    COORDINATION OF CARE: 5:19 PM- Pt advised of plan for treatment  and pt agrees.  Labs Review Labs Reviewed - No data to display  Imaging Review No results found.   EKG Interpretation None      MDM   Final diagnoses:  Right-sided low back pain with right-sided sciatica    Pt is a 64yo female c/o exacerbation of Right low back pain. Already taking oxycodone at home and has finished 1 week prednisone. No red flag symptoms. No recent injury. No indication for emergent imaging at this time.  IM morphine given in ED Rx: flexeril. Strongly encouraged to f/u with her orthopedist. Return precautions provided. Pt and family verbalized understanding and agreement with tx plan.   I personally performed the services described in this documentation, which was scribed in my presence. The recorded information has been reviewed and is accurate.    Noland Fordyce, PA-C 11/11/14 2015  Pamella Pert, MD 11/11/14 2350

## 2014-11-12 ENCOUNTER — Encounter: Payer: Self-pay | Admitting: Family Medicine

## 2014-11-12 ENCOUNTER — Telehealth: Payer: Self-pay | Admitting: *Deleted

## 2014-11-12 ENCOUNTER — Ambulatory Visit (INDEPENDENT_AMBULATORY_CARE_PROVIDER_SITE_OTHER): Payer: BLUE CROSS/BLUE SHIELD | Admitting: Family Medicine

## 2014-11-12 VITALS — BP 121/76 | HR 76 | Wt 169.0 lb

## 2014-11-12 DIAGNOSIS — M5417 Radiculopathy, lumbosacral region: Secondary | ICD-10-CM

## 2014-11-12 DIAGNOSIS — M5416 Radiculopathy, lumbar region: Secondary | ICD-10-CM

## 2014-11-12 MED ORDER — GABAPENTIN 300 MG PO CAPS: ORAL_CAPSULE | ORAL | Status: DC

## 2014-11-12 MED ORDER — HYDROMORPHONE HCL 2 MG PO TABS: ORAL_TABLET | ORAL | Status: DC

## 2014-11-12 NOTE — Progress Notes (Signed)
CC: Anne [name] is a 64 y.o. female is here for Back Pain   Subjective: HPI:  Gradual onset and gradual worsening of right low back pain that began last week. She denies any recent trauma or overexertion. She's had pain like this before but never to this degree. Currently at severe in severity. Seems to be worsening on a daily basis.it is bad enough to where she is having great difficulty sleeping. Interventions begin with hydrocodone which was ineffective, she was then advanced to oxycodone by her orthopedist which is also ineffective. She was seenby our orthopedist earlier this week and had x-rays done of the lumbar region showing that "my back is a mess" and something about lumbar vertebrae being abnormal but she is not sure of the specifics. Her orthopedist has set her up for an MRI without contrast of the lumbar spine this coming Tuesday. She was seen at an emergency room last night and was given morphine and asked to follow with her PCP for better pain control. Pain radiates down the right leg into the arch of the foot. There is a numbness burning sensation to this pain. She also feels like she is a little unsteady on her right leg. She denies any new bowel or bladder incontinence or any other motor or sensory disturbances.   Review Of Systems Outlined In HPI  Past Medical History  Diagnosis Date  . Disc herniation     neck and low back  . Follicular cystitis   . Hiatal hernia   . Schatzki's ring   . Osteopenia 2014    T score -1.6 FRAX 13%/1%  . Hyperlipidemia     ELEV CHOLESTEROL  . Thyroid disease     HYPOTHYROID  . Acid reflux   . Hernia     Past Surgical History  Procedure Laterality Date  . Thyroidectomy  1976  . Varicose veins  1972  . Cataract extraction  2011    LEFT  . Breast surgery  1997    breast tissue biopsy-benign/.BIOPSIES x 2  . Hysteroscopy    . Oophorectomy  04/2000    LAP RSO/LYSIS OF ADHESIONS  . Tubal ligation  1976  . Dilation and curettage of  uterus  1996    x2 , 1999  . Laparoscopic cholecystectomy  09/28/11  . Cataract extraction  RIGHt    12/20/12 - Dr. Haynes Bast, OD  . Ventral hernia repair  06/23/13    Dr. Franz Dell   Family History  Problem Relation Age of Onset  . Stroke Sister   . Hypertension Sister   . Arthritis Sister   . Lupus Sister   . Cancer Sister   . Breast cancer Sister     Age 50  . Hyperlipidemia Brother     deceased  . Hypertension Brother   . Arthritis Daughter     Rheumatoid  . Heart attack Father 67  . Heart disease Father   . Anuerysm Father   . Cancer Brother 57    melanoma    History   Social History  . Marital Status: Married    Spouse Name: [name]  . Number of Children: 1  . Years of Education: N/A   Occupational History  . Not on file.   Social History Main Topics  . Smoking status: Former Smoker    Types: Cigarettes    Quit date: 01/27/1979  . Smokeless tobacco: Never Used  . Alcohol Use: Yes     Comment: Rare  .  Drug Use: No  . Sexual Activity:    Partners: Male    Birth Control/ Protection: Post-menopausal   Other Topics Concern  . Not on file   Social History Narrative   4 brothers and 6 sisters. All 4 brother deceased.  2 sisters deceased.  No regular exercise. Still working.       Objective: BP 121/76 mmHg  Pulse 76  Wt 169 lb (76.658 kg)  LMP 09/08/2000  Vital signs reviewed. General: Alert and Oriented, No Acute Distress HEENT: Pupils equal, round, reactive to light. Conjunctivae clear.  External ears unremarkable.  Moist mucous membranes. Lungs: Clear and comfortable work of breathing, speaking in full sentences without accessory muscle use. Cardiac: Regular rate and rhythm.  Neuro: CN II-XII grossly intact, gait normal. Extremities: No peripheral edema.  Strong peripheral pulses. Full range of motion and strength of the right lower extremity Mental Status: No depression, anxiety, nor agitation. Logical though process. Skin: Warm and  dry.  Assessment & Plan: Marizol was seen today for back pain.  Diagnoses and all orders for this visit:  Right lumbar radiculitis  Other orders -     HYDROmorphone (DILAUDID) 2 MG tablet; One by mouth every 4-6 hours only as needed for pain. -     gabapentin (NEURONTIN) 300 MG capsule; One by mouth at bedtime nightly, if helpful and tolerated may take three times a day.   Lumbar radiculitis: Continue to follow through with MRI from orthopedics office. Offered by a lot of to help mask the pain and Neurontin which may help directly treat some of her pain. Advised her to continue to rely on her daughter for transportation since these medications are heavily sedating. Signs and symptoms requring emergent/urgent reevaluation were discussed with the patient.  Return if symptoms worsen or fail to improve.

## 2014-11-12 NOTE — Telephone Encounter (Signed)
Pt called and stated that she is still in pain with her back. She was seen in the ED on the 5/18 and was given IM of morphine and rx for flexeril. She wanted to know if she could be given something else for pain. Pt advised that she would really need to f/u with her orthopedist with regards to her pain. I informed her that she would need to come in anyway to be seen. Pt transferred to scheduling to set appt. She was able to be seen by Dr. Ileene Rubens today for an Acute appt.Audelia Hives [place]

## 2014-11-16 ENCOUNTER — Ambulatory Visit: Payer: Medicare Other | Admitting: Sports Medicine

## 2014-11-18 ENCOUNTER — Other Ambulatory Visit: Payer: Self-pay

## 2014-11-18 DIAGNOSIS — Z1231 Encounter for screening mammogram for malignant neoplasm of breast: Secondary | ICD-10-CM

## 2014-11-24 ENCOUNTER — Telehealth: Payer: Self-pay | Admitting: *Deleted

## 2014-11-24 MED ORDER — HYDROMORPHONE HCL 2 MG PO TABS: ORAL_TABLET | ORAL | Status: DC

## 2014-11-24 MED ORDER — GABAPENTIN 300 MG PO CAPS: 300.0000 mg | ORAL_CAPSULE | Freq: Three times a day (TID) | ORAL | Status: DC

## 2014-11-24 NOTE — Telephone Encounter (Signed)
Both meds refilled. She ill need to pick up rx for dilaudid. After injection may need ot speak with orth about taking over her pain med needs.

## 2014-11-24 NOTE — Telephone Encounter (Signed)
Pt was seen by [facility] on 5/19 for right lumbar radiculitis and was rx'ed Dilaudid and  Gabapentin. She has an appt for an Epidural on 12/08/14 but she will be out of medication then. She is requesting refills of both medications

## 2014-11-25 ENCOUNTER — Telehealth: Payer: Self-pay | Admitting: *Deleted

## 2014-11-25 ENCOUNTER — Other Ambulatory Visit: Payer: Self-pay | Admitting: Family Medicine

## 2014-11-25 MED ORDER — HYDROMORPHONE HCL 2 MG PO TABS: ORAL_TABLET | ORAL | Status: DC

## 2014-11-25 NOTE — Telephone Encounter (Signed)
Patient advised to pick up prescriptions from the front desk. Patient advised of recommendations.

## 2014-11-25 NOTE — Telephone Encounter (Signed)
Pt called and stated that she is coming to the last of her prescriptions and would like refills before her epidural on 6.14.16.  Pt advised that she will need to p/u rx up front.Anne [name]

## 2014-11-27 ENCOUNTER — Other Ambulatory Visit: Payer: Self-pay | Admitting: Family Medicine

## 2014-12-11 ENCOUNTER — Encounter: Payer: Medicare Other | Admitting: Family Medicine

## 2014-12-19 ENCOUNTER — Other Ambulatory Visit: Payer: Self-pay | Admitting: Family Medicine

## 2014-12-20 ENCOUNTER — Other Ambulatory Visit: Payer: Self-pay | Admitting: Family Medicine

## 2014-12-21 ENCOUNTER — Telehealth: Payer: Self-pay | Admitting: *Deleted

## 2014-12-21 ENCOUNTER — Other Ambulatory Visit: Payer: Self-pay

## 2014-12-21 NOTE — Telephone Encounter (Signed)
Called and spoke w/pt's husband since she was not available and informed him that she will need to schedule her f/u appt with Dr. Madilyn Fireman. Also advised him that we would work on the no show for 6/17 since she did call to cancel. Spoke with J.Todd about this.Audelia Hives [place]

## 2014-12-25 ENCOUNTER — Ambulatory Visit: Payer: BLUE CROSS/BLUE SHIELD

## 2015-01-01 ENCOUNTER — Ambulatory Visit
Admission: RE | Admit: 2015-01-01 | Discharge: 2015-01-01 | Disposition: A | Discharge status: Home / Self Care | Payer: BLUE CROSS/BLUE SHIELD | Source: Ambulatory Visit

## 2015-01-01 DIAGNOSIS — Z1231 Encounter for screening mammogram for malignant neoplasm of breast: Secondary | ICD-10-CM

## 2015-01-04 ENCOUNTER — Other Ambulatory Visit: Payer: Self-pay | Admitting: Family Medicine

## 2015-01-04 DIAGNOSIS — R928 Other abnormal and inconclusive findings on diagnostic imaging of breast: Secondary | ICD-10-CM

## 2015-01-05 ENCOUNTER — Ambulatory Visit
Admission: RE | Admit: 2015-01-05 | Discharge: 2015-01-05 | Disposition: A | Discharge status: Home / Self Care | Payer: BLUE CROSS/BLUE SHIELD | Source: Ambulatory Visit | Attending: Family Medicine | Admitting: Family Medicine

## 2015-01-05 DIAGNOSIS — R928 Other abnormal and inconclusive findings on diagnostic imaging of breast: Secondary | ICD-10-CM

## 2015-01-12 ENCOUNTER — Other Ambulatory Visit: Payer: Self-pay | Admitting: *Deleted

## 2015-01-12 MED ORDER — OMEPRAZOLE 40 MG PO CPDR: DELAYED_RELEASE_CAPSULE | ORAL | Status: DC

## 2015-01-25 ENCOUNTER — Encounter: Payer: Self-pay | Admitting: Family Medicine

## 2015-01-25 ENCOUNTER — Ambulatory Visit (INDEPENDENT_AMBULATORY_CARE_PROVIDER_SITE_OTHER): Payer: BLUE CROSS/BLUE SHIELD | Admitting: Family Medicine

## 2015-01-25 VITALS — BP 131/75 | HR 85 | Ht 65.0 in | Wt 167.0 lb

## 2015-01-25 DIAGNOSIS — E038 Other specified hypothyroidism: Secondary | ICD-10-CM

## 2015-01-25 DIAGNOSIS — Z0000 Encounter for general adult medical examination without abnormal findings: Secondary | ICD-10-CM

## 2015-01-25 DIAGNOSIS — Z13220 Encounter for screening for lipoid disorders: Secondary | ICD-10-CM | POA: Diagnosis not present

## 2015-01-25 NOTE — Patient Instructions (Signed)
Keep up a regular exercise program and make sure you are eating a healthy diet Try to eat 4 servings of dairy a day, or if you are lactose intolerant take a calcium with vitamin D daily.  Your vaccines are up to date.   

## 2015-01-25 NOTE — Progress Notes (Signed)
Subjective:    Anne [name] is a 64 y.o. female who presents for Annual/Subsequent preventive examination.  Preventive Screening-Counseling & Management  Tobacco History  Smoking status  . Former Smoker  . Types: Cigarettes  . Quit date: 01/27/1979  Smokeless tobacco  . Never Used     Problems Prior to Visit 1.   Current Problems (verified) Patient Active Problem List   Diagnosis Date Noted  . Aortic atherosclerosis 04/28/2013  . Umbilical hernia 38/93/7342  . Schatzki's ring 10/16/2012  . Spinal stenosis of lumbar region 08/16/2012  . Vaginal atrophy 11/14/2011  . Disc herniation   . Osteopenia   . Thyroid disease   . PERSONAL HISTORY OF ALLERGY TO LATEX 12/23/2009  . UNSPECIFIED DISEASE OF PHARYNX 12/16/2008  . FATIGUE 11/10/2008  . PALPITATIONS 11/10/2008  . Hypothyroidism 09/22/2008  . Hyperlipidemia 09/22/2008  . ALLERGIC RHINITIS 09/22/2008    Medications Prior to Visit Current Outpatient Prescriptions on File Prior to Visit  Medication Sig Dispense Refill  . aspirin 81 MG tablet Take 81 mg by mouth daily.      [name] CALCIUM PO Take by mouth.    . Cholecalciferol (VITAMIN D) 2000 UNITS tablet Take 2,000 Units by mouth daily.      [name] estradiol (ESTRACE) 0.1 MG/GM vaginal cream Place 8.76 Applicatorfuls vaginally as needed. For vaginal irritation 42.5 g 2  . HYDROcodone-acetaminophen (VICODIN) 5-500 MG per tablet Take 1 tablet by mouth 2 (two) times daily as needed. 45 tablet 0  . levothyroxine (SYNTHROID, LEVOTHROID) 75 MCG tablet Take 75 mcg by mouth daily.      . meloxicam (MOBIC) 15 MG tablet TAKE 1 TABLET (15 MG TOTAL) BY MOUTH DAILY. 90 tablet 0  . metoprolol tartrate (LOPRESSOR) 25 MG tablet TAKE 1 TABLET (25 MG TOTAL) BY MOUTH DAILY. 90 tablet 0  . mometasone (NASONEX) 50 MCG/ACT nasal spray Place 2 sprays into the nose daily.    . Multiple Vitamin (MULTIVITAMIN) tablet Take 1 tablet by mouth daily.      [name] omeprazole (PRILOSEC) 40 MG capsule TAKE 1  CAPSULE (40 MG TOTAL) BY MOUTH DAILY. 90 capsule 1  . simvastatin (ZOCOR) 40 MG tablet TAKE 1 TABLET (40 MG TOTAL) BY MOUTH EVERY EVENING. 90 tablet 1   No current facility-administered medications on file prior to visit.    Current Medications (verified) Current Outpatient Prescriptions  Medication Sig Dispense Refill  . aspirin 81 MG tablet Take 81 mg by mouth daily.      [name] CALCIUM PO Take by mouth.    . Cholecalciferol (VITAMIN D) 2000 UNITS tablet Take 2,000 Units by mouth daily.      [name] estradiol (ESTRACE) 0.1 MG/GM vaginal cream Place 8.11 Applicatorfuls vaginally as needed. For vaginal irritation 42.5 g 2  . HYDROcodone-acetaminophen (VICODIN) 5-500 MG per tablet Take 1 tablet by mouth 2 (two) times daily as needed. 45 tablet 0  . levothyroxine (SYNTHROID, LEVOTHROID) 75 MCG tablet Take 75 mcg by mouth daily.      . meloxicam (MOBIC) 15 MG tablet TAKE 1 TABLET (15 MG TOTAL) BY MOUTH DAILY. 90 tablet 0  . metoprolol tartrate (LOPRESSOR) 25 MG tablet TAKE 1 TABLET (25 MG TOTAL) BY MOUTH DAILY. 90 tablet 0  . mometasone (NASONEX) 50 MCG/ACT nasal spray Place 2 sprays into the nose daily.    . Multiple Vitamin (MULTIVITAMIN) tablet Take 1 tablet by mouth daily.      [name] omeprazole (PRILOSEC) 40 MG capsule TAKE 1 CAPSULE (40 MG TOTAL) BY  MOUTH DAILY. 90 capsule 1  . simvastatin (ZOCOR) 40 MG tablet TAKE 1 TABLET (40 MG TOTAL) BY MOUTH EVERY EVENING. 90 tablet 1   No current facility-administered medications for this visit.     Allergies (verified) Amoxicillin-pot clavulanate; Augmentin; Ceftriaxone sodium; Codeine; Fluconazole in dextrose; Molds & smuts; and Zofran   PAST HISTORY  Family History Family History  Problem Relation Age of Onset  . Stroke Sister   . Hypertension Sister   . Arthritis Sister   . Lupus Sister   . Cancer Sister   . Breast cancer Sister     Age 37  . Hyperlipidemia Brother     deceased  . Hypertension Brother   . Arthritis Daughter     Rheumatoid  .  Heart attack Father 40  . Heart disease Father   . Anuerysm Father   . Cancer Brother 52    melanoma    Social History History  Substance Use Topics  . Smoking status: Former Smoker    Types: Cigarettes    Quit date: 01/27/1979  . Smokeless tobacco: Never Used  . Alcohol Use: Yes     Comment: Rare     Are there smokers in your home (other than you)? No  Risk Factors Current exercise habits: walks some for exercise  Dietary issues discussed: None   Cardiac risk factors: advanced age (older than 53 for men, 69 for women) and dyslipidemia.  Depression Screen (Note: if answer to either of the following is "Yes", a more complete depression screening is indicated)   Over the past two weeks, have you felt down, depressed or hopeless? No  Over the past two weeks, have you felt little interest or pleasure in doing things? No  Have you lost interest or pleasure in daily life? No  Do you often feel hopeless? No  Do you cry easily over simple problems? No  Activities of Daily Living In your present state of health, do you have any difficulty performing the following activities?:  Driving? No Managing money?  No Feeding yourself? No Getting from bed to chair? No  Climbing a flight of stairs? No Preparing food and eating?: No Bathing or showering? No Getting dressed: No Getting to the toilet? No Using the toilet:No Moving around from place to place: No In the past year have you fallen or had a near fall?:No    Hearing Difficulties: No Do you often ask people to speak up or repeat themselves? No Do you experience ringing or noises in your ears? No Do you have difficulty understanding soft or whispered voices? No   Do you feel that you have a problem with memory? No  Do you often misplace items? No  Do you feel safe at home?  Yes  Cognitive Testing  Alert? Yes  Normal Appearance?Yes  Oriented to person? Yes  Place? Yes   Time? Yes  Recall of three objects?  Yes  Can  perform simple calculations? Yes  Displays appropriate judgment?Yes  Can read the correct time from a watch face?Yes   6 CIT score 0/28 ( normal)    Advanced Directives have been discussed with the patient? No  List the Names of Other Physician/Practitioners you currently use: 1.  Dr. Abbie Sons  Indicate any recent Medical Services you may have received from other than [facility] providers in the past year (date may be approximate).  Immunization History  Administered Date(s) Administered  . Influenza Split 06/02/2011, 05/03/2012  . Influenza Whole 04/16/2009  .  Influenza,inj,Quad PF,36+ Mos 05/01/2013, 04/17/2014  . Pneumococcal Conjugate-13 10/17/2013  . Td 01/21/2010  . Zoster 08/18/2011    Screening Tests Health Maintenance  Topic Date Due  . INFLUENZA VACCINE  01/25/2015  . Hepatitis C Screening  01/25/2016 (Originally 10-12-1950)  . PNA vac Low Risk Adult (2 of 2 - PPSV23) 01/25/2016 (Originally 10/18/2014)  . COLONOSCOPY  12/03/2016  . MAMMOGRAM  12/31/2016  . TETANUS/TDAP  01/22/2020  . DEXA SCAN  Completed  . ZOSTAVAX  Completed    All answers were reviewed with the patient and necessary referrals were made:  METHENEY,CATHERINE, MD   01/25/2015   History reviewed: allergies, current medications, past family history, past medical history, past social history, past surgical history and problem list  Review of Systems Review of systems not obtained due to patient factors.    Objective:     Vision by Snellen chart: Sees Dr. Kathlen Mody   :Body mass index is 27.79 kg/(m^2). BP 131/75 mmHg  Pulse 85  Ht 5\' 5"  (1.651 m)  Wt 167 lb (75.751 kg)  BMI 27.79 kg/m2  LMP 09/08/2000  BP 131/75 mmHg  Pulse 85  Ht 5\' 5"  (1.651 m)  Wt 167 lb (75.751 kg)  BMI 27.79 kg/m2  LMP 09/08/2000 General appearance: alert, cooperative and appears stated age Head: Normocephalic, without obvious abnormality, atraumatic Eyes: conj clar, EOMI, PEERLA Ears: normal TM's and external ear  canals both ears Nose: Nares normal. Septum midline. Mucosa normal. No drainage or sinus tenderness. Throat: lips, mucosa, and tongue normal; teeth and gums normal Neck: no adenopathy, no carotid bruit, no JVD, supple, symmetrical, trachea midline and thyroid not enlarged, symmetric, no tenderness/mass/nodules Back: symmetric, no curvature. ROM normal. No CVA tenderness. Lungs: clear to auscultation bilaterally Heart: regular rate and rhythm, S1, S2 normal, no murmur, click, rub or gallop Abdomen: soft, non-tender; bowel sounds normal; no masses,  no organomegaly Extremities: extremities normal, atraumatic, no cyanosis or edema Pulses: 2+ and symmetric Skin: Skin color, texture, turgor normal. No rashes or lesions Lymph nodes: Cervical adenopathy: nl and Supraclavicular adenopathy: nl Neurologic: Alert and oriented X 3, normal strength and tone. Normal symmetric reflexes. Normal coordination and gait     Assessment:     Annual  Wellness Exam       Plan:     During the course of the visit the patient was educated and counseled about appropriate screening and preventive services including:    Pneumococcal vaccine  - declined.  Hepatitis C screening-declined.  Mammogram is UTD.   Due for gyn in November.   Diet review for nutrition referral? Yes ____  Not Indicated _X __   Patient Instructions (the written plan) was given to the patient.  The patient's weight, height, BMI, and visual acuity have been recorded in the chart.  I have made referrals, counseling, and provided education to the patient based on review of the above and I have provided the patient with a written personalized care plan for preventive services.     METHENEY,CATHERINE, MD   01/25/2015

## 2015-01-26 LAB — COMPLETE METABOLIC PANEL WITH GFR
ALK PHOS: 62 U/L (ref 33–130)
ALT: 21 U/L (ref 6–29)
AST: 21 U/L (ref 10–35)
Albumin: 3.9 g/dL (ref 3.6–5.1)
BUN: 10 mg/dL (ref 7–25)
CALCIUM: 9.2 mg/dL (ref 8.6–10.4)
CHLORIDE: 104 mmol/L (ref 98–110)
CO2: 28 mmol/L (ref 20–31)
Creat: 0.87 mg/dL (ref 0.50–0.99)
GFR, Est African American: 80 mL/min (ref >=60)
GFR, Est Non African American: 70 mL/min (ref >=60)
GLUCOSE: 101 mg/dL — AB (ref 65–99)
POTASSIUM: 4.1 mmol/L (ref 3.5–5.3)
Sodium: 140 mmol/L (ref 135–146)
Total Bilirubin: 0.8 mg/dL (ref 0.2–1.2)
Total Protein: 6.6 g/dL (ref 6.1–8.1)

## 2015-01-26 LAB — LIPID PANEL
Cholesterol: 148 mg/dL (ref 125–200)
HDL: 43 mg/dL — ABNORMAL LOW (ref >=46)
LDL CALC: 79 mg/dL (ref <130)
Total CHOL/HDL Ratio: 3.4 Ratio (ref <=5.0)
Triglycerides: 132 mg/dL (ref <150)
VLDL: 26 mg/dL (ref <30)

## 2015-02-25 ENCOUNTER — Other Ambulatory Visit: Payer: Self-pay | Admitting: Family Medicine

## 2015-03-21 ENCOUNTER — Other Ambulatory Visit: Payer: Self-pay | Admitting: Family Medicine

## 2015-03-29 ENCOUNTER — Other Ambulatory Visit: Payer: Self-pay | Admitting: Family Medicine

## 2015-03-29 DIAGNOSIS — N6310 Unspecified lump in the right breast, unspecified quadrant: Secondary | ICD-10-CM

## 2015-05-24 ENCOUNTER — Encounter: Payer: Self-pay | Admitting: Osteopathic Medicine

## 2015-05-24 ENCOUNTER — Ambulatory Visit (INDEPENDENT_AMBULATORY_CARE_PROVIDER_SITE_OTHER): Payer: BLUE CROSS/BLUE SHIELD | Admitting: Osteopathic Medicine

## 2015-05-24 VITALS — BP 142/61 | HR 75 | Temp 98.0°F | Ht 65.0 in | Wt 172.0 lb

## 2015-05-24 DIAGNOSIS — J302 Other seasonal allergic rhinitis: Secondary | ICD-10-CM | POA: Diagnosis not present

## 2015-05-24 DIAGNOSIS — J3489 Other specified disorders of nose and nasal sinuses: Secondary | ICD-10-CM

## 2015-05-24 NOTE — Progress Notes (Signed)
HPI: Anne [name] is a 64 y.o. female who presents to [facility]  today for chief complaint of:  Chief Complaint  Patient presents with  . Sinus Problem   . Location: just distal to bridge of nose . Quality: Soreness, itching, occasional bloody nose . Duration: 2 days . Context: History of allergies, has prescription for Nasonex but has not used it . Modifying factors: Has tried applying Neosporin to the inside of the nose to some relief, no other over-the-counter medications . Assoc signs/symptoms: Sinus congestion, no sore throat, no cough   Past medical, social and family history reviewed: Past Medical History  Diagnosis Date  . Disc herniation     neck and low back  . Follicular cystitis   . Hiatal hernia   . Schatzki's ring   . Osteopenia 2014    T score -1.6 FRAX 13%/1%  . Hyperlipidemia     ELEV CHOLESTEROL  . Thyroid disease     HYPOTHYROID  . Acid reflux   . Hernia    Past Surgical History  Procedure Laterality Date  . Thyroidectomy  1976  . Varicose veins  1972  . Cataract extraction  2011    LEFT  . Breast surgery  1997    breast tissue biopsy-benign/.BIOPSIES x 2  . Hysteroscopy    . Oophorectomy  04/2000    LAP RSO/LYSIS OF ADHESIONS  . Tubal ligation  1976  . Dilation and curettage of uterus  1996    x2 , 1999  . Laparoscopic cholecystectomy  09/28/11  . Cataract extraction  RIGHt    12/20/12 - Dr. Haynes Bast, OD  . Ventral hernia repair  06/23/13    Dr. Franz Dell   Social History  Substance Use Topics  . Smoking status: Former Smoker    Types: Cigarettes    Quit date: 01/27/1979  . Smokeless tobacco: Never Used  . Alcohol Use: Yes     Comment: Rare   Family History  Problem Relation Age of Onset  . Stroke Sister   . Hypertension Sister   . Arthritis Sister   . Lupus Sister   . Cancer Sister   . Breast cancer Sister     Age 35  . Hyperlipidemia Brother     deceased  . Hypertension Brother    . Arthritis Daughter     Rheumatoid  . Heart attack Father 53  . Heart disease Father   . Anuerysm Father   . Cancer Brother 65    melanoma    Current Outpatient Prescriptions  Medication Sig Dispense Refill  . aspirin 81 MG tablet Take 81 mg by mouth daily.      [name] CALCIUM PO Take by mouth.    . Cholecalciferol (VITAMIN D) 2000 UNITS tablet Take 2,000 Units by mouth daily.      [name] estradiol (ESTRACE) 0.1 MG/GM vaginal cream Place [id number] Applicatorfuls vaginally as needed. For vaginal irritation 42.5 g 2  . HYDROcodone-acetaminophen (VICODIN) 5-500 MG per tablet Take 1 tablet by mouth 2 (two) times daily as needed. 45 tablet 0  . levothyroxine (SYNTHROID, LEVOTHROID) 75 MCG tablet Take 75 mcg by mouth daily.      . meloxicam (MOBIC) 15 MG tablet Take 1 tablet (15 mg total) by mouth daily as needed for pain. 90 tablet 0  . metoprolol tartrate (LOPRESSOR) 25 MG tablet TAKE 1 TABLET (25 MG TOTAL) BY MOUTH DAILY. 90 tablet 0  . mometasone (NASONEX) 50 MCG/ACT nasal spray  Place 2 sprays into the nose daily.    . Multiple Vitamin (MULTIVITAMIN) tablet Take 1 tablet by mouth daily.      [name] omeprazole (PRILOSEC) 40 MG capsule TAKE 1 CAPSULE (40 MG TOTAL) BY MOUTH DAILY. 90 capsule 1  . simvastatin (ZOCOR) 40 MG tablet TAKE 1 TABLET (40 MG TOTAL) BY MOUTH EVERY EVENING. 90 tablet 1   No current facility-administered medications for this visit.   Allergies  Allergen Reactions  . Amoxicillin-Pot Clavulanate     REACTION: blisters in colon  . Augmentin [Amoxicillin-Pot Clavulanate]     Colon blisters  . Ceftriaxone Sodium     REACTION: throat swells (ROCEPHIN)  . Codeine     REACTION: nausea  . Fluconazole In Dextrose     ? INTERACTION WITH SIMVASTATIN  . Molds & Smuts   . Zofran [Ondansetron Hcl]     Headaches      Review of Systems: CONSTITUTIONAL:  No  fever, no chills, No  unintentional weight changes HEAD/EYES/EARS/NOSE/THROAT: No headache, no vision change, no hearing change, No   sore throat, nasal problem as per HPI CARDIAC: No chest pain, no pressure/palpitations, no orthopnea RESPIRATORY: No  cough, No  shortness of breath/wheeze  Exam:  BP 142/61 mmHg  Pulse 75  Temp(Src) 98 F (36.7 C) (Oral)  Ht 5\' 5"  (1.651 m)  Wt 172 lb (78.019 kg)  BMI 28.62 kg/m2  LMP 09/08/2000 Constitutional: VSS, see above. General Appearance: alert, well-developed, well-nourished, NAD Eyes: Normal lids and conjunctive, non-icteric sclera, PERRLA Ears, Nose, Mouth, Throat: Normal external inspection ears/nares/mouth/lips/gums, TM normal bilaterally, MMM, posterior pharynx No  erythema No  Exudate, nasal mucosa normal Neck: No masses, trachea midline. No thyroid enlargement/tenderness/mass appreciated. No lymphadenopathy Respiratory: Normal respiratory effort. no wheeze, no rhonchi, no rales Cardiovascular: S1/S2 normal, no murmur, no rub/gallop auscultated. RRR.    No results found for this or any previous visit (from the past 72 hour(s)).    ASSESSMENT/PLAN: Patient information printed, advised use Mucinex, saline nasal spray, humidifier use, would avoid Neosporin but could try Vaseline, return if no better  Sinus drainage - question due to seasonal allergies versus early viral URI versus dry nose  Seasonal allergies     Return if symptoms worsen or fail to improve.

## 2015-05-24 NOTE — Patient Instructions (Signed)
As was discussed in the office, try using Flonase, add saline nasal spray to help with moisturizing, would not do this after the Flonase as it may wash the medicine way. Can also try over-the-counter antihistamine such as Benadryl or Claritin or Zyrtec, however this might dry out the nose more. Can put Vaseline on any irritated areas of the nasal skin. This may also be the beginning of a viral upper respiratory infection, continue with over-the-counter medicines as needed, please let us know if you are not feeling any better or if you are getting worse within the week.

## 2015-05-25 ENCOUNTER — Other Ambulatory Visit: Payer: Self-pay

## 2015-05-25 MED ORDER — METOPROLOL TARTRATE 25 MG PO TABS
ORAL_TABLET | ORAL | Status: DC
Start: 2015-05-25 — End: 2015-08-30

## 2015-05-28 ENCOUNTER — Other Ambulatory Visit: Payer: Self-pay | Admitting: Family Medicine

## 2015-07-02 ENCOUNTER — Other Ambulatory Visit: Payer: BLUE CROSS/BLUE SHIELD

## 2015-07-09 ENCOUNTER — Other Ambulatory Visit: Payer: Self-pay | Admitting: Emergency Medicine

## 2015-07-09 DIAGNOSIS — M5417 Radiculopathy, lumbosacral region: Secondary | ICD-10-CM

## 2015-07-09 MED ORDER — MELOXICAM 15 MG PO TABS: 15.0000 mg | ORAL_TABLET | Freq: Every day | ORAL | Status: DC | PRN

## 2015-07-13 ENCOUNTER — Encounter: Payer: Self-pay | Admitting: Family Medicine

## 2015-07-13 ENCOUNTER — Ambulatory Visit (INDEPENDENT_AMBULATORY_CARE_PROVIDER_SITE_OTHER): Payer: Medicare Other | Admitting: Family Medicine

## 2015-07-13 ENCOUNTER — Other Ambulatory Visit (HOSPITAL_COMMUNITY)
Admission: RE | Admit: 2015-07-13 | Discharge: 2015-07-13 | Disposition: A | Discharge status: Home / Self Care | Payer: Medicare Other | Source: Ambulatory Visit | Attending: Family Medicine | Admitting: Family Medicine

## 2015-07-13 VITALS — BP 127/58 | HR 78 | Temp 98.5°F | Wt 172.0 lb

## 2015-07-13 DIAGNOSIS — Z124 Encounter for screening for malignant neoplasm of cervix: Secondary | ICD-10-CM

## 2015-07-13 DIAGNOSIS — N760 Acute vaginitis: Secondary | ICD-10-CM | POA: Diagnosis not present

## 2015-07-13 DIAGNOSIS — N9489 Other specified conditions associated with female genital organs and menstrual cycle: Secondary | ICD-10-CM

## 2015-07-13 DIAGNOSIS — N949 Unspecified condition associated with female genital organs and menstrual cycle: Secondary | ICD-10-CM

## 2015-07-13 DIAGNOSIS — R102 Pelvic and perineal pain: Secondary | ICD-10-CM | POA: Insufficient documentation

## 2015-07-13 DIAGNOSIS — S8012XA Contusion of left lower leg, initial encounter: Secondary | ICD-10-CM

## 2015-07-13 DIAGNOSIS — Z1151 Encounter for screening for human papillomavirus (HPV): Secondary | ICD-10-CM | POA: Diagnosis not present

## 2015-07-13 DIAGNOSIS — Z01411 Encounter for gynecological examination (general) (routine) with abnormal findings: Secondary | ICD-10-CM | POA: Diagnosis not present

## 2015-07-13 DIAGNOSIS — Z113 Encounter for screening for infections with a predominantly sexual mode of transmission: Secondary | ICD-10-CM | POA: Diagnosis not present

## 2015-07-13 NOTE — Progress Notes (Signed)
   Subjective:    Patient ID: Anne [name], female    DOB: [birth year], 65 y.o.   MRN: [id number]  HPI She is having pain just above her pelvic pain for about 6 months..  Worse when sits for a long period of time.  Worse when she tries to stand up.  No urinary sxs. No hematuria. No dysuria. No odor to the urine. No back pain.  Occ constipation but uses a stool softener every other day and that usually works for her.  She has had one of her ovaries removed for a cyst but it was benign. Her Pap smears are up-to-date. She was seeing Dr. Phineas Real in [place].  She also has a lesion on the shin on her left lower leg. She says she bumped it about a year ago and it was swollen for quite a while and red but then it eventually went away. This starting about a week ago it started to become red and tender again. She doesn't remember any new injury or trauma. No fevers chills or sweats.  Review of Systems     Objective:   Physical Exam  Constitutional: She is oriented to person, place, and time. She appears well-developed and well-nourished.  HENT:  Head: Normocephalic and atraumatic.  Eyes: Conjunctivae and EOM are normal.  Cardiovascular: Normal rate.   Pulmonary/Chest: Effort normal.  Genitourinary: Vagina normal. No labial fusion. There is no rash, tenderness, lesion or injury on the right labia. There is no rash, tenderness, lesion or injury on the left labia. Uterus is enlarged and tender. Cervix exhibits discharge. Right adnexum displays no mass, no tenderness and no fullness. Left adnexum displays no mass, no tenderness and no fullness.  Yellow/green discharge at the cervix.  Uterus is mildly enlarged and tender.    Musculoskeletal:  On her left lower leg approximately mid shin she does have a slightly discolored purple area. It is tender to touch and it's over the shin. Induration. No red streaking from the area. No open wound.  Neurological: She is alert and oriented to person, place, and  time.  Skin: Skin is dry. No pallor.  Psychiatric: She has a normal mood and affect. Her behavior is normal.  Vitals reviewed.         Assessment & Plan:  Pelvic pain-Pap smear performed today. She did have an abnormal discharge at the cervix. Due for workup for gonorrhea Chlamydia, yeast, bacterial overgrowth. Also felt some enlargement to the uterus and she was tender. Will schedule for ultrasound for further evaluation. Call results once available.  Contusion to shin on the left lower leg. Based on exam it's most consistent with a contusion. Encouraged her to keep an eye on it over the next few weeks and let me know if she feels like it's not resolving or improving. I did not see anything worrisome such as infection or mass. If she had a fracture of the tibia I think she would have remembered an injury.

## 2015-07-16 ENCOUNTER — Ambulatory Visit
Admission: RE | Admit: 2015-07-16 | Discharge: 2015-07-16 | Disposition: A | Discharge status: Home / Self Care | Payer: Medicare Other | Source: Ambulatory Visit | Attending: Family Medicine | Admitting: Family Medicine

## 2015-07-16 DIAGNOSIS — N6310 Unspecified lump in the right breast, unspecified quadrant: Secondary | ICD-10-CM

## 2015-07-16 DIAGNOSIS — N63 Unspecified lump in breast: Secondary | ICD-10-CM | POA: Diagnosis not present

## 2015-07-16 LAB — CYTOLOGY - PAP

## 2015-07-16 LAB — CERVICOVAGINAL ANCILLARY ONLY
BACTERIAL VAGINITIS: NEGATIVE
Candida vaginitis: NEGATIVE

## 2015-07-21 ENCOUNTER — Ambulatory Visit (INDEPENDENT_AMBULATORY_CARE_PROVIDER_SITE_OTHER): Payer: Medicare Other

## 2015-07-21 DIAGNOSIS — R102 Pelvic and perineal pain: Secondary | ICD-10-CM

## 2015-07-21 DIAGNOSIS — N949 Unspecified condition associated with female genital organs and menstrual cycle: Secondary | ICD-10-CM | POA: Diagnosis not present

## 2015-07-21 DIAGNOSIS — N9489 Other specified conditions associated with female genital organs and menstrual cycle: Secondary | ICD-10-CM

## 2015-08-30 ENCOUNTER — Other Ambulatory Visit: Payer: Self-pay | Admitting: *Deleted

## 2015-08-30 MED ORDER — METOPROLOL TARTRATE 25 MG PO TABS: ORAL_TABLET | ORAL | Status: DC

## 2015-09-05 ENCOUNTER — Other Ambulatory Visit: Payer: Self-pay | Admitting: Family Medicine

## 2015-09-24 DIAGNOSIS — E890 Postprocedural hypothyroidism: Secondary | ICD-10-CM | POA: Diagnosis not present

## 2015-09-24 DIAGNOSIS — E0500 Thyrotoxicosis with diffuse goiter without thyrotoxic crisis or storm: Secondary | ICD-10-CM | POA: Diagnosis not present

## 2015-09-24 DIAGNOSIS — E785 Hyperlipidemia, unspecified: Secondary | ICD-10-CM | POA: Diagnosis not present

## 2015-09-24 DIAGNOSIS — K5900 Constipation, unspecified: Secondary | ICD-10-CM | POA: Diagnosis not present

## 2015-09-26 ENCOUNTER — Other Ambulatory Visit: Payer: Self-pay | Admitting: Family Medicine

## 2015-12-06 DIAGNOSIS — E039 Hypothyroidism, unspecified: Secondary | ICD-10-CM | POA: Diagnosis not present

## 2015-12-30 ENCOUNTER — Other Ambulatory Visit: Payer: Self-pay | Admitting: Family Medicine

## 2015-12-30 DIAGNOSIS — N63 Unspecified lump in unspecified breast: Secondary | ICD-10-CM

## 2016-02-04 ENCOUNTER — Encounter: Payer: Self-pay | Admitting: Family Medicine

## 2016-02-04 ENCOUNTER — Ambulatory Visit (INDEPENDENT_AMBULATORY_CARE_PROVIDER_SITE_OTHER): Payer: Medicare Other | Admitting: Family Medicine

## 2016-02-04 VITALS — BP 125/65 | HR 78 | Resp 19 | Ht 65.0 in | Wt 172.0 lb

## 2016-02-04 DIAGNOSIS — Z23 Encounter for immunization: Secondary | ICD-10-CM | POA: Diagnosis not present

## 2016-02-04 DIAGNOSIS — R7309 Other abnormal glucose: Secondary | ICD-10-CM

## 2016-02-04 DIAGNOSIS — Z1159 Encounter for screening for other viral diseases: Secondary | ICD-10-CM | POA: Diagnosis not present

## 2016-02-04 DIAGNOSIS — Z0000 Encounter for general adult medical examination without abnormal findings: Secondary | ICD-10-CM

## 2016-02-04 DIAGNOSIS — Z13220 Encounter for screening for lipoid disorders: Secondary | ICD-10-CM

## 2016-02-04 LAB — COMPLETE METABOLIC PANEL WITH GFR
ALBUMIN: 4.1 g/dL (ref 3.6–5.1)
ALK PHOS: 61 U/L (ref 33–130)
ALT: 17 U/L (ref 6–29)
AST: 19 U/L (ref 10–35)
BILIRUBIN TOTAL: 0.7 mg/dL (ref 0.2–1.2)
BUN: 10 mg/dL (ref 7–25)
CO2: 27 mmol/L (ref 20–31)
Calcium: 9.2 mg/dL (ref 8.6–10.4)
Chloride: 105 mmol/L (ref 98–110)
Creat: 0.86 mg/dL (ref 0.50–0.99)
GFR, Est African American: 81 mL/min (ref >=60)
GFR, Est Non African American: 70 mL/min (ref >=60)
GLUCOSE: 100 mg/dL — AB (ref 65–99)
Potassium: 4.8 mmol/L (ref 3.5–5.3)
SODIUM: 141 mmol/L (ref 135–146)
TOTAL PROTEIN: 6.6 g/dL (ref 6.1–8.1)

## 2016-02-04 LAB — LIPID PANEL
Cholesterol: 176 mg/dL (ref 125–200)
HDL: 53 mg/dL (ref >=46)
LDL Cholesterol: 102 mg/dL (ref <130)
Total CHOL/HDL Ratio: 3.3 Ratio (ref <=5.0)
Triglycerides: 105 mg/dL (ref <150)
VLDL: 21 mg/dL (ref <30)

## 2016-02-04 LAB — HEMOGLOBIN A1C
HEMOGLOBIN A1C: 4.9 % (ref <5.7)
MEAN PLASMA GLUCOSE: 94 mg/dL

## 2016-02-04 NOTE — Progress Notes (Addendum)
Subjective:   Anne [name] is a 65 y.o. female who presents for Medicare Annual (Subsequent) preventive examination.  Review of Systems:  Comprehensive ROS is negative.        Objective:     Vitals: BP 125/65 (BP Location: Left Arm, Patient Position: Sitting, Cuff Size: Normal)   Pulse 78   Resp 19   Ht 5\' 5"  (1.651 m)   Wt 172 lb (78 kg)   LMP 09/08/2000   SpO2 99%   BMI 28.62 kg/m   Body mass index is 28.62 kg/m.   Tobacco History  Smoking Status  . Former Smoker  . Types: Cigarettes  . Quit date: 01/27/1979  Smokeless Tobacco  . Never Used     Counseling given: Not Answered  Physical Exam  Constitutional: She is oriented to person, place, and time. She appears well-developed and well-nourished.  HENT:  Head: Normocephalic and atraumatic.  Right Ear: External ear normal.  Left Ear: External ear normal.  Nose: Nose normal.  Mouth/Throat: Oropharynx is clear and moist.  TMs and canals are clear.   Eyes: Conjunctivae and EOM are normal. Pupils are equal, round, and reactive to light.  Neck: Neck supple. No thyromegaly present.  Cardiovascular: Normal rate, regular rhythm and normal heart sounds.   Pulmonary/Chest: Effort normal and breath sounds normal. She has no wheezes.  Abdominal: She exhibits no distension. There is no tenderness. There is no guarding.  Musculoskeletal: She exhibits no edema.  Lymphadenopathy:    She has no cervical adenopathy.  Neurological: She is alert and oriented to person, place, and time.  Skin: Skin is warm and dry.  Psychiatric: She has a normal mood and affect. Her behavior is normal. Thought content normal.     Past Medical History:  Diagnosis Date  . Acid reflux   . Disc herniation    neck and low back  . Follicular cystitis   . Hernia   . Hiatal hernia   . Hyperlipidemia    ELEV CHOLESTEROL  . Osteopenia 2014   T score -1.6 FRAX 13%/1%  . Schatzki's ring   . Thyroid disease    HYPOTHYROID   Past  Surgical History:  Procedure Laterality Date  . BREAST SURGERY  1997   breast tissue biopsy-benign/.BIOPSIES x 2  . CATARACT EXTRACTION  2011   LEFT  . CATARACT EXTRACTION  [facility]   12/20/12 - Dr. Haynes Bast, [place]  . [place]   x2 , 1999  . HYSTEROSCOPY    . LAPAROSCOPIC CHOLECYSTECTOMY  09/28/11  . OOPHORECTOMY  04/2000   LAP RSO/LYSIS OF ADHESIONS  . THYROIDECTOMY  1976  . TUBAL LIGATION  1976  . varicose veins  1972  . VENTRAL HERNIA REPAIR  06/23/13   Dr. Franz Dell   Family History  Problem Relation Age of Onset  . Stroke Sister   . Hypertension Sister   . Arthritis Sister   . Lupus Sister   . Cancer Sister   . Breast cancer Sister     Age 80  . Hyperlipidemia Brother     deceased  . Hypertension Brother   . Arthritis Daughter     Rheumatoid  . Heart attack Father 72  . Heart disease Father   . Anuerysm Father   . Cancer Brother 18    melanoma   History  Sexual Activity  . Sexual activity: Yes  . Partners: Male  . Birth control/ protection: Post-menopausal  Outpatient Encounter Prescriptions as of 02/04/2016  Medication Sig  . aspirin 81 MG tablet Take 81 mg by mouth daily.    [name] CALCIUM PO Take by mouth.  . Cholecalciferol (VITAMIN D) 2000 UNITS tablet Take 2,000 Units by mouth daily.    [name] estradiol (ESTRACE) 0.1 MG/GM vaginal cream Place [id number] Applicatorfuls vaginally as needed. For vaginal irritation  . HYDROcodone-acetaminophen (VICODIN) 5-500 MG per tablet Take 1 tablet by mouth 2 (two) times daily as needed.  [name] levothyroxine (SYNTHROID, LEVOTHROID) 75 MCG tablet Take 75 mcg by mouth daily.    . meloxicam (MOBIC) 15 MG tablet TAKE ONE TABLET BY MOUTH ONCE DAILY AS NEEDED FOR PAIN  . metoprolol tartrate (LOPRESSOR) 25 MG tablet TAKE 1 TABLET (25 MG TOTAL) BY MOUTH DAILY.  . mometasone (NASONEX) 50 MCG/ACT nasal spray Place 2 sprays into the nose daily.  . Multiple Vitamin (MULTIVITAMIN) tablet Take 1 tablet by mouth daily.     [name] omeprazole (PRILOSEC) 40 MG capsule TAKE 1 CAPSULE (40 MG TOTAL) BY MOUTH DAILY.  . simvastatin (ZOCOR) 40 MG tablet TAKE 1 TABLET (40 MG TOTAL) BY MOUTH EVERY EVENING.   No facility-administered encounter medications on file as of 02/04/2016.     Activities of Daily Living In your present state of health, do you have any difficulty performing the following activities: 02/04/2016  Hearing? N  Vision? N  Difficulty concentrating or making decisions? N  Walking or climbing stairs? N  Dressing or bathing? N  Doing errands, shopping? N  Some recent data might be hidden    Patient Care Team: [name], MD as PCP - General Gwendel Hanson, MD as Referring Physician (Urology) [name], MD as Consulting Physician (Gynecology) Jyl Heinz, MD as Referring Physician (Endocrinology)    Assessment:     Exercise Activities and Dietary recommendations Current Exercise Habits: Home exercise routine, Type of exercise: walking, Intensity: Mild  Goals    None     Fall Risk Fall Risk  02/04/2016 01/25/2015 10/17/2013 09/29/2013  Falls in the past year? No No No No   Depression Screen PHQ 2/9 Scores 02/04/2016 01/25/2015 10/17/2013 09/29/2013  PHQ - 2 Score 0 0 0 0     Cognitive Testing No flowsheet data found.  Immunization History  Administered Date(s) Administered  . Influenza Split 06/02/2011, 05/03/2012  . Influenza Whole 04/16/2009  . Influenza,inj,Quad PF,36+ Mos 05/01/2013, 04/17/2014  . Pneumococcal Conjugate-13 10/17/2013  . Pneumococcal Polysaccharide-23 02/04/2016  . Td 01/21/2010  . Zoster 08/18/2011   Screening Tests Health Maintenance  Topic Date Due  . Hepatitis C Screening  1950-06-30  . PNA vac Low Risk Adult (2 of 2 - PPSV23) 10/18/2014  . INFLUENZA VACCINE  01/25/2016  . COLONOSCOPY  12/03/2016  . MAMMOGRAM  12/31/2016  . DEXA SCAN  05/30/2018  . TETANUS/TDAP  01/22/2020  . ZOSTAVAX  Completed      Plan:     Medicare  Wellness Exam    During the course of the visit the patient was educated and counseled about the following appropriate screening and preventive services:   Vaccines to include Pneumoccal, Influenza, Hepatitis B, Td, Zostavax, HCV  Cardiovascular Disease - exercise.   Colorectal cancer screening done, due for Repeat colonoscopy next year  Bone density screening - done in 2014. Will see when she is due for the next 1.  Diabetes screening  Glaucoma screening  Mammography/PAP - schedule for next week.   PCV 23 given.   Patient Instructions (  the written plan) was given to the patient.   Jaana Brodt, MD  02/04/2016

## 2016-02-04 NOTE — Patient Instructions (Signed)
Keep up a regular exercise program and make sure you are eating a healthy diet Try to eat 4 servings of dairy a day, or if you are lactose intolerant take a calcium with vitamin D daily.  Your vaccines are up to date.   

## 2016-02-05 LAB — HEPATITIS C ANTIBODY: HCV Ab: NEGATIVE

## 2016-02-08 ENCOUNTER — Other Ambulatory Visit: Payer: Self-pay | Admitting: Family Medicine

## 2016-02-08 DIAGNOSIS — E039 Hypothyroidism, unspecified: Secondary | ICD-10-CM | POA: Diagnosis not present

## 2016-02-08 MED ORDER — FLUOCINOLONE ACETONIDE 0.01 % OT OIL: 5.0000 [drp] | TOPICAL_OIL | Freq: Every day | OTIC | 0 refills | Status: DC | PRN

## 2016-02-08 NOTE — Progress Notes (Signed)
Derm-otic oil ordered for year per discussion from physical. Her ENT was previously prescribing a topical steroid cream for ear itching and dryness. She asked about be willing to take over the prescription.  Beatrice Lecher, MD

## 2016-02-10 ENCOUNTER — Telehealth: Payer: Self-pay | Admitting: *Deleted

## 2016-02-11 ENCOUNTER — Ambulatory Visit
Admission: RE | Admit: 2016-02-11 | Discharge: 2016-02-11 | Disposition: A | Discharge status: Home / Self Care | Payer: Medicare Other | Source: Ambulatory Visit | Attending: Family Medicine | Admitting: Family Medicine

## 2016-02-11 DIAGNOSIS — N63 Unspecified lump in unspecified breast: Secondary | ICD-10-CM

## 2016-02-16 MED ORDER — HYDROCORTISONE-ACETIC ACID 1-2 % OT SOLN: 4.0000 [drp] | Freq: Two times a day (BID) | OTIC | 1 refills | Status: DC | PRN

## 2016-02-16 NOTE — Telephone Encounter (Signed)
Patient notified that new prescription will be at the [facility]

## 2016-02-16 NOTE — Telephone Encounter (Signed)
No problem. I will just send over the hydrocortisone acetic acid eardrops instead. They should work just as well. New prescription sent.

## 2016-02-16 NOTE — Telephone Encounter (Signed)
PA initiated through covermymeds for fluocinolone otic drops. The preferred in Hydrocortisone acetic ear drops so this may be denied by insurance. For dry itchy ears used dx code h61.893. Would you like to write a prescription for preferred or wait on response from insurance?

## 2016-02-18 ENCOUNTER — Telehealth: Payer: Self-pay | Admitting: Family Medicine

## 2016-02-18 MED ORDER — NEOMYCIN-POLYMYXIN-HC 3.5-10000-1 OT SOLN: 4.0000 [drp] | Freq: Four times a day (QID) | OTIC | 0 refills | Status: DC

## 2016-02-18 NOTE — Telephone Encounter (Signed)
Received a note from the pharmacy that Cortisoporin otic 1% suspension may be cheaper. Will send over new perception to the pharmacy.

## 2016-02-25 ENCOUNTER — Telehealth: Payer: Self-pay | Admitting: *Deleted

## 2016-02-25 NOTE — Telephone Encounter (Signed)
If a superficial cyst then we can refer to derm for full excision.

## 2016-02-25 NOTE — Telephone Encounter (Signed)
Called pt and she stated that she had informed Dr. Madilyn Fireman of this at her previous CPE in 2016. She stated that this is a sebaceous cyst in her L breast.  She stated that she the area is on top of her breast near her cleavage area she wanted to check with Dr. Madilyn Fireman before saw her dermatologist.Oriah Leinweber, [place]

## 2016-02-25 NOTE — Telephone Encounter (Signed)
Pt lvm stating that she had a mammogram done and she has a cyst on her L breast that has gotten larger and gotten more sensitive. She said that she would like to have this removed, she wanted to know Dr. Gardiner Ramus thoughts on this. She also thought that this was something that was discussed previously and she thought that Dr. Madilyn Fireman told her that this was something she could take care of for her? Will fwd to pcp for advice.Audelia Hives [place]

## 2016-03-09 ENCOUNTER — Other Ambulatory Visit: Payer: Self-pay | Admitting: Family Medicine

## 2016-04-10 ENCOUNTER — Other Ambulatory Visit: Payer: Self-pay | Admitting: Family Medicine

## 2016-05-01 ENCOUNTER — Other Ambulatory Visit: Payer: Self-pay

## 2016-05-01 MED ORDER — SIMVASTATIN 40 MG PO TABS: 40.0000 mg | ORAL_TABLET | Freq: Every day | ORAL | 2 refills | Status: DC

## 2016-07-16 ENCOUNTER — Other Ambulatory Visit: Payer: Self-pay | Admitting: Family Medicine

## 2016-09-04 ENCOUNTER — Other Ambulatory Visit: Payer: Self-pay | Admitting: Family Medicine

## 2016-09-21 ENCOUNTER — Encounter: Payer: Self-pay | Admitting: Family Medicine

## 2016-09-21 ENCOUNTER — Ambulatory Visit (INDEPENDENT_AMBULATORY_CARE_PROVIDER_SITE_OTHER): Payer: Medicare Other | Admitting: Family Medicine

## 2016-09-21 VITALS — BP 128/50 | HR 87 | Ht 65.0 in | Wt 174.0 lb

## 2016-09-21 DIAGNOSIS — N6082 Other benign mammary dysplasias of left breast: Secondary | ICD-10-CM

## 2016-09-21 DIAGNOSIS — L720 Epidermal cyst: Secondary | ICD-10-CM

## 2016-09-21 DIAGNOSIS — L570 Actinic keratosis: Secondary | ICD-10-CM

## 2016-09-21 DIAGNOSIS — L308 Other specified dermatitis: Secondary | ICD-10-CM | POA: Diagnosis not present

## 2016-09-21 NOTE — Progress Notes (Signed)
Subjective:    Patient ID: Anne [name], female    DOB: December 17, 1950, 66 y.o.   MRN: [id number]  HPI   66 year old female comes in today she would like to look at several lesions on her skin. We had previously looked at a sebaceous cyst on her left breast. It's been there for several years but she came back in today because she does feel like it's getting a little bit larger. She says she is also noticing that occasionally it'll be a little bit painful. No erythema or drainage or fever. She also has a little nodule on the outer upper earlobe on the left side that has also been there before and she would like me to look at as well.  There are also a few erythematous lesions on her left posterior forearm and one on her right bicep. They are slightly erythematous with just a little bit of fine scale on the surface. They are not bothersome irritating or itchy but she would like me to look at those as well.  Review of Systems     Objective:   Physical Exam  Constitutional: She is oriented to person, place, and time. She appears well-developed and well-nourished.  HENT:  Head: Normocephalic and atraumatic.  Eyes: Conjunctivae and EOM are normal.  Cardiovascular: Normal rate.   Pulmonary/Chest: Effort normal.  Neurological: She is alert and oriented to person, place, and time.  Skin: Skin is dry. No pallor.  On her posterior left forearm she has 2 lesions that are consistent with actinic keratoses. Slightly red with some dry scale. She has a similar smaller lesion on the right bicep. On the posterior edge of the upper outer earlobe on the left ear she has what looks like a very large milia approximately 3 mm in size. And on her left inner upper breast she has a approximately 2 and half to 3 cm round sebaceous cyst. No induration or sign of acute infection.  Psychiatric: She has a normal mood and affect. Her behavior is normal.  Vitals reviewed.       Assessment & Plan:   Actinic  keratoses-recommend cryotherapy for definitive treatment.  Sebaceous cyst of left breast-recommend that she see dermatology for full excision since it is larger and on the breast area. If it's getting larger than it definitely needs to be removed.  milial cyst on the left posterior ear-recommend lancing and removal from of anterior material with electrocautery so it does not recur. Follow-up wound care recommended is regular soap and water in the shower. Pat dry and apply Vaseline twice a day 1 week.  Eczema-she wanted a recommendation for bath product that would help her skin. Recommend the Aveeno bath packs that or oatmeal-based and can be seeding to the skin.  Incision and Procedure Note  Pre-operative Diagnosis: large milia  Post-operative Diagnosis: same  Indications: getting larger  Anesthesia: 1% plain lidocaine  Procedure Details  The procedure, risks and complications have been discussed in detail (including, but not limited to airway compromise, infection, bleeding) with the patient, and the patient has signed consent to the procedure.  The skin was sterilely prepped and draped over the affected area in the usual fashion. After adequate local anesthesia, I&D with a #11 blade was performed on the left outer upper posterior ear lob. Purulent drainage: absent, but did remove  White firm sebum material. Electrocautery was used to that the lesion does not recur. The patient was observed until stable.  Findings: milia  EBL: trace  Drains: none  Condition: Tolerated procedure well   Complications: none.  Cryotherapy Procedure Note  Pre-operative Diagnosis: Actinic keratosis  Post-operative Diagnosis: Actinic keratosis  Locations: left bicep and right posterior forearm    Indications: precancerous  Anesthesia: not required    Procedure Details  Patient informed of risks (permanent scarring, infection, light or dark discoloration, bleeding, infection, weakness,  numbness and recurrence of the lesion) and benefits of the procedure and verbal informed consent obtained.  The areas are treated with liquid nitrogen therapy, frozen until ice ball extended 1-2 mm beyond lesion, allowed to thaw, and treated again. The patient tolerated procedure well.  The patient was instructed on post-op care, warned that there may be blister formation, redness and pain. Recommend OTC analgesia as needed for pain.  Condition: Stable  Complications: none.  Plan: 1. Instructed to keep the area dry and covered for 24-48h and clean thereafter. 2. Warning signs of infection were reviewed.   3. Recommended that the patient use OTC acetaminophen as needed for pain.  4. Return PRN.

## 2016-09-21 NOTE — Patient Instructions (Signed)
Will place referral for dermatology

## 2016-10-02 DIAGNOSIS — K5900 Constipation, unspecified: Secondary | ICD-10-CM | POA: Diagnosis not present

## 2016-10-02 DIAGNOSIS — E785 Hyperlipidemia, unspecified: Secondary | ICD-10-CM | POA: Diagnosis not present

## 2016-10-02 DIAGNOSIS — E890 Postprocedural hypothyroidism: Secondary | ICD-10-CM | POA: Diagnosis not present

## 2016-10-02 DIAGNOSIS — E0500 Thyrotoxicosis with diffuse goiter without thyrotoxic crisis or storm: Secondary | ICD-10-CM | POA: Diagnosis not present

## 2016-10-13 ENCOUNTER — Other Ambulatory Visit: Payer: Self-pay | Admitting: Family Medicine

## 2016-10-17 DIAGNOSIS — L720 Epidermal cyst: Secondary | ICD-10-CM | POA: Diagnosis not present

## 2017-01-15 ENCOUNTER — Other Ambulatory Visit: Payer: Self-pay | Admitting: Family Medicine

## 2017-01-15 DIAGNOSIS — Z1231 Encounter for screening mammogram for malignant neoplasm of breast: Secondary | ICD-10-CM

## 2017-01-15 DIAGNOSIS — N6310 Unspecified lump in the right breast, unspecified quadrant: Secondary | ICD-10-CM

## 2017-01-16 ENCOUNTER — Other Ambulatory Visit: Payer: Self-pay | Admitting: Family Medicine

## 2017-02-08 ENCOUNTER — Encounter: Payer: Self-pay | Admitting: Family Medicine

## 2017-02-08 ENCOUNTER — Ambulatory Visit (INDEPENDENT_AMBULATORY_CARE_PROVIDER_SITE_OTHER): Payer: Medicare Other | Admitting: Family Medicine

## 2017-02-08 VITALS — BP 130/70 | HR 81 | Wt 177.0 lb

## 2017-02-08 DIAGNOSIS — R7309 Other abnormal glucose: Secondary | ICD-10-CM

## 2017-02-08 DIAGNOSIS — Z0000 Encounter for general adult medical examination without abnormal findings: Secondary | ICD-10-CM | POA: Diagnosis not present

## 2017-02-08 DIAGNOSIS — I700 Atherosclerosis of aorta: Secondary | ICD-10-CM

## 2017-02-08 MED ORDER — MELOXICAM 15 MG PO TABS: ORAL_TABLET | ORAL | 0 refills | Status: DC

## 2017-02-08 NOTE — Progress Notes (Signed)
Subjective:   Anne [name] is a 66 y.o. female who presents for Medicare Annual (Subsequent) preventive examination.  Review of Systems:  Comprehensive ROS is negative.        Objective:     Vitals: BP 130/70   Pulse 81   Wt 177 lb (80.3 kg)   LMP 09/08/2000   BMI 29.45 kg/m   Body mass index is 29.45 kg/m.   Tobacco History  Smoking Status  . Former Smoker  . Types: Cigarettes  . Quit date: 01/27/1979  Smokeless Tobacco  . Never Used     Counseling given: Not Answered   Past Medical History:  Diagnosis Date  . Acid reflux   . Disc herniation    neck and low back  . Follicular cystitis   . Hernia   . Hiatal hernia   . Hyperlipidemia    ELEV CHOLESTEROL  . Osteopenia 2014   T score -1.6 FRAX 13%/1%  . Schatzki's ring   . Thyroid disease    HYPOTHYROID   Past Surgical History:  Procedure Laterality Date  . BREAST SURGERY  1997   breast tissue biopsy-benign/.BIOPSIES x 2  . CATARACT EXTRACTION  2011   LEFT  . CATARACT EXTRACTION  [place]   12/20/12 - Dr. Haynes Bast, [place]  . [place]   x2 , 1999  . HYSTEROSCOPY    . LAPAROSCOPIC CHOLECYSTECTOMY  09/28/11  . OOPHORECTOMY  04/2000   LAP RSO/LYSIS OF ADHESIONS  . THYROIDECTOMY  1976  . TUBAL LIGATION  1976  . varicose veins  1972  . VENTRAL HERNIA REPAIR  06/23/13   Dr. Franz Dell   Family History  Problem Relation Age of Onset  . Cancer Brother 79       melanoma  . Stroke Sister   . Hypertension Sister   . Arthritis Sister   . Lupus Sister   . Cancer Sister   . Breast cancer Sister        Age 61  . Hyperlipidemia Brother        deceased  . Hypertension Brother   . Arthritis Daughter        Rheumatoid  . Heart attack Father 4  . Heart disease Father   . Anuerysm Father    History  Sexual Activity  . Sexual activity: Yes  . Partners: Male  . Birth control/ protection: Post-menopausal    Outpatient Encounter Prescriptions as of 02/08/2017   Medication Sig  . aspirin 81 MG tablet Take 81 mg by mouth daily.    [name] CALCIUM PO Take by mouth.  . Cholecalciferol (VITAMIN D) 2000 UNITS tablet Take 2,000 Units by mouth daily.    [name] estradiol (ESTRACE) 0.1 MG/GM vaginal cream Place 2.72 Applicatorfuls vaginally as needed. For vaginal irritation  . levothyroxine (SYNTHROID, LEVOTHROID) 75 MCG tablet Take 75 mcg by mouth daily.    . meloxicam (MOBIC) 15 MG tablet TAKE 1 TABLET BY MOUTH ONCE DAILY AS NEEDED FOR PAIN  . metoprolol tartrate (LOPRESSOR) 25 MG tablet TAKE ONE TABLET BY MOUTH ONCE DAILY  . mometasone (NASONEX) 50 MCG/ACT nasal spray Place 2 sprays into the nose daily.  . Multiple Vitamin (MULTIVITAMIN) tablet Take 1 tablet by mouth daily.    [name] omeprazole (PRILOSEC) 40 MG capsule TAKE 1 CAPSULE (40 MG TOTAL) BY MOUTH DAILY.  . simvastatin (ZOCOR) 40 MG tablet Take 1 tablet (40 mg total) by mouth daily at 6 PM.  . [DISCONTINUED] meloxicam (MOBIC)  15 MG tablet TAKE 1 TABLET BY MOUTH ONCE DAILY AS NEEDED FOR PAIN   No facility-administered encounter medications on file as of 02/08/2017.     Activities of Daily Living In your present state of health, do you have any difficulty performing the following activities: 02/08/2017  Hearing? N  Vision? N  Difficulty concentrating or making decisions? N  Walking or climbing stairs? N  Dressing or bathing? N  Doing errands, shopping? N  Some recent data might be hidden    Patient Care Team: [name], MD as PCP - General Gwendel Hanson, MD as Referring Physician (Urology) Phineas Real Belinda Block, MD as Consulting Physician (Gynecology) Jyl Heinz, MD as Referring Physician (Endocrinology)    Assessment:     Exercise Activities and Dietary recommendations Current Exercise Habits: Home exercise routine, Type of exercise: walking, Frequency (Times/Week): 2, Intensity: Mild  Goals    None     Fall Risk Fall Risk  02/08/2017 02/04/2016 01/25/2015 10/17/2013  09/29/2013  Falls in the past year? No No No No No   Depression Screen PHQ 2/9 Scores 02/08/2017 02/04/2016 01/25/2015 10/17/2013  PHQ - 2 Score 0 0 0 0     Cognitive Function     6CIT Screen 02/08/2017  What Year? 0 points  What month? 0 points  What time? 0 points  Count back from 20 0 points  Months in reverse 0 points  Repeat phrase 0 points  Total Score 0    Immunization History  Administered Date(s) Administered  . Influenza Split 06/02/2011, 05/03/2012  . Influenza Whole 04/16/2009  . Influenza,inj,Quad PF,36+ Mos 05/01/2013, 04/17/2014  . Pneumococcal Conjugate-13 10/17/2013  . Pneumococcal Polysaccharide-23 02/04/2016  . Td 01/21/2010  . Zoster 08/18/2011   Screening Tests Health Maintenance  Topic Date Due  . COLONOSCOPY  12/03/2016  . INFLUENZA VACCINE  09/22/2018 (Originally 01/24/2017)  . MAMMOGRAM  02/10/2018  . DEXA SCAN  05/30/2018  . TETANUS/TDAP  01/22/2020  . Hepatitis C Screening  Completed  . PNA vac Low Risk Adult  Completed      Plan:  Medicare Wellness EXam    I have personally reviewed and noted the following in the patient's chart:   . Medical and social history . Use of alcohol, tobacco or illicit drugs  . Current medications and supplements - UTD.  . Functional ability and status . Nutritional status . Physical activity . Advanced directives - discussed getting up to date directive.  . List of other physicians - Dr. Wardell Heath . Hospitalizations, surgeries, and ER visits in previous 12 months . Vitals . Screenings to include cognitive, depression, and falls . Referrals and appointments . Dsicussed shingles vaccine today.   . Reminded her due for repeat colonoscpoy.    In addition, I have reviewed and discussed with patient certain preventive protocols, quality metrics, and best practice recommendations. A written personalized care plan for preventive services as well as general preventive health recommendations were provided to patient.      Beatrice Lecher, MD  02/08/2017

## 2017-02-08 NOTE — Patient Instructions (Addendum)

## 2017-02-12 ENCOUNTER — Ambulatory Visit
Admission: RE | Admit: 2017-02-12 | Discharge: 2017-02-12 | Disposition: A | Discharge status: Home / Self Care | Payer: Medicare Other | Source: Ambulatory Visit | Attending: Family Medicine | Admitting: Family Medicine

## 2017-02-12 DIAGNOSIS — N6310 Unspecified lump in the right breast, unspecified quadrant: Secondary | ICD-10-CM

## 2017-02-12 DIAGNOSIS — R7309 Other abnormal glucose: Secondary | ICD-10-CM | POA: Diagnosis not present

## 2017-02-12 DIAGNOSIS — I700 Atherosclerosis of aorta: Secondary | ICD-10-CM | POA: Diagnosis not present

## 2017-02-12 DIAGNOSIS — R922 Inconclusive mammogram: Secondary | ICD-10-CM | POA: Diagnosis not present

## 2017-02-13 LAB — HEMOGLOBIN A1C
Hgb A1c MFr Bld: 5 % (ref <5.7)
MEAN PLASMA GLUCOSE: 97 mg/dL

## 2017-02-13 LAB — COMPLETE METABOLIC PANEL WITH GFR
ALBUMIN: 4.2 g/dL (ref 3.6–5.1)
ALK PHOS: 68 U/L (ref 33–130)
ALT: 19 U/L (ref 6–29)
AST: 18 U/L (ref 10–35)
BILIRUBIN TOTAL: 0.8 mg/dL (ref 0.2–1.2)
BUN: 12 mg/dL (ref 7–25)
CO2: 23 mmol/L (ref 20–32)
CREATININE: 0.96 mg/dL (ref 0.50–0.99)
Calcium: 9.5 mg/dL (ref 8.6–10.4)
Chloride: 104 mmol/L (ref 98–110)
GFR, Est African American: 70 mL/min (ref >=60)
GFR, Est Non African American: 61 mL/min (ref >=60)
GLUCOSE: 111 mg/dL — AB (ref 65–99)
Potassium: 4.3 mmol/L (ref 3.5–5.3)
SODIUM: 141 mmol/L (ref 135–146)
TOTAL PROTEIN: 6.6 g/dL (ref 6.1–8.1)

## 2017-02-13 LAB — LIPID PANEL W/REFLEX DIRECT LDL
Cholesterol: 165 mg/dL (ref <200)
HDL: 45 mg/dL — ABNORMAL LOW (ref >50)
LDL-CHOLESTEROL: 97 mg/dL
Non-HDL Cholesterol (Calc): 120 mg/dL (ref <130)
Total CHOL/HDL Ratio: 3.7 Ratio (ref <5.0)
Triglycerides: 130 mg/dL (ref <150)

## 2017-02-15 ENCOUNTER — Telehealth: Payer: Self-pay

## 2017-02-15 NOTE — Telephone Encounter (Signed)
Pt stated that at her visit with Dr Madilyn Fireman, they discussed a cologuard vs colonoscopy, and pt opted for colonoscopy.  She received the cologuard box today, contacted thenm and they said to throw it in the trash.  She wanted to make sure she was not charged for it.

## 2017-02-16 NOTE — Telephone Encounter (Signed)
As far as a charge for the box that I am unclear about. We do not charge. It would be the company that she spoke to. I suspect they do not charge and Magda Paganini actually run the test but she may want to call them to confirm. If she is wanting to move forward with colonoscopy them please see who she wants to see an go ahead and place order for referral.

## 2017-02-16 NOTE — Telephone Encounter (Signed)
Left VM with instructions to call if she would like to go ahead with the colonoscopy and we will place the referral.

## 2017-03-01 ENCOUNTER — Other Ambulatory Visit: Payer: Self-pay | Admitting: Family Medicine

## 2017-03-09 ENCOUNTER — Other Ambulatory Visit: Payer: Self-pay | Admitting: Family Medicine

## 2017-04-05 DIAGNOSIS — E890 Postprocedural hypothyroidism: Secondary | ICD-10-CM | POA: Diagnosis not present

## 2017-04-05 DIAGNOSIS — E0500 Thyrotoxicosis with diffuse goiter without thyrotoxic crisis or storm: Secondary | ICD-10-CM | POA: Diagnosis not present

## 2017-05-22 ENCOUNTER — Other Ambulatory Visit: Payer: Self-pay | Admitting: Family Medicine

## 2017-05-23 DIAGNOSIS — E890 Postprocedural hypothyroidism: Secondary | ICD-10-CM | POA: Diagnosis not present

## 2017-05-23 DIAGNOSIS — E0500 Thyrotoxicosis with diffuse goiter without thyrotoxic crisis or storm: Secondary | ICD-10-CM | POA: Diagnosis not present

## 2017-06-25 ENCOUNTER — Encounter: Payer: Self-pay | Admitting: Family Medicine

## 2017-06-25 ENCOUNTER — Ambulatory Visit (INDEPENDENT_AMBULATORY_CARE_PROVIDER_SITE_OTHER): Payer: Medicare Other | Admitting: Family Medicine

## 2017-06-25 VITALS — BP 136/61 | HR 85 | Temp 98.7°F | Ht 65.0 in | Wt 174.0 lb

## 2017-06-25 DIAGNOSIS — R002 Palpitations: Secondary | ICD-10-CM

## 2017-06-25 DIAGNOSIS — J0100 Acute maxillary sinusitis, unspecified: Secondary | ICD-10-CM | POA: Diagnosis not present

## 2017-06-25 MED ORDER — AZITHROMYCIN 250 MG PO TABS: ORAL_TABLET | ORAL | 0 refills | Status: AC

## 2017-06-25 NOTE — Patient Instructions (Addendum)
Sinus Rinse What is a sinus rinse? A sinus rinse is a home treatment. It rinses your sinuses with a mixture of salt and water (saline solution). Sinuses are air-filled spaces in your skull behind the bones of your face and forehead. They open into your nasal cavity. To do a sinus rinse, you will need:  Saline solution.  Neti pot or spray bottle. This releases the saline solution into your nose and through your sinuses. You can buy neti pots and spray bottles at: ? Your local pharmacy. ? A health food store. ? Online.  When should I do a sinus rinse? A sinus rinse can help to clear your nasal cavity. It can clear:  Mucus.  Dirt.  Dust.  Pollen.  You may do a sinus rinse when you have:  A cold.  A virus.  Allergies.  A sinus infection.  A stuffy nose.  If you are considering a sinus rinse:  Ask your child's doctor before doing a sinus rinse on your child.  Do not do a sinus rinse if you have had: ? Ear or nasal surgery. ? An ear infection. ? Blocked ears.  How do I do a sinus rinse?  Wash your hands.  Disinfect your device using the directions that came with the device.  Dry your device.  Use the solution that comes with your device or one that is sold separately in stores. Follow the mixing directions on the package.  Fill your device with the amount of saline solution as stated in the device instructions.  Stand over a sink and tilt your head sideways over the sink.  Place the spout of the device in your upper nostril (the one closer to the ceiling).  Gently pour or squeeze the saline solution into the nasal cavity. The liquid should drain to the lower nostril if you are not too congested.  Gently blow your nose. Blowing too hard may cause ear pain.  Repeat in the other nostril.  Clean and rinse your device with clean water.  Air-dry your device. Are there risks of a sinus rinse? Sinus rinse is normally very safe and helpful. However, there are a  few risks, which include:  A burning feeling in the sinuses. This may happen if you do not make the saline solution as instructed. Make sure to follow all directions when making the saline solution.  Infection from unclean water. This is rare, but possible.  Nasal irritation.  This information is not intended to replace advice given to you by your health care provider. Make sure you discuss any questions you have with your health care provider. Document Released: 01/07/2014 Document Revised: 05/09/2016 Document Reviewed: 10/28/2013 Elsevier Interactive Patient Education  2017 Elsevier Inc.  

## 2017-06-25 NOTE — Progress Notes (Signed)
   Subjective:    Patient ID: Anne [name], female    DOB: 1951/04/08, 66 y.o.   MRN: [id number]  HPI Sinus sxs x 3 days. she has been using mucinex. she has facial pain that started on the R side she stated that her nose was stopped up and was burning this has since moved to the L side. and she has been blowing out yellow mucous. No fever chilss or sweats. Some cough form post nasal drip.    She also has an appointment for march to follow-up for her palpitations for metoprolol.  She says that they are under [name] control with the metoprolol and wants to know if she should cancel that appointment.  Review of Systems     Objective:   Physical Exam  Constitutional: She is oriented to person, place, and time. She appears well-developed and well-nourished.  HENT:  Head: Normocephalic and atraumatic.  Right Ear: External ear normal.  Left Ear: External ear normal.  Nose: Nose normal.  Mouth/Throat: Oropharynx is clear and moist.  TMs and canals are clear.   Eyes: Conjunctivae and EOM are normal. Pupils are equal, round, and reactive to light.  Neck: Neck supple. No thyromegaly present.  Cardiovascular: Normal rate, regular rhythm and normal heart sounds.  Pulmonary/Chest: Effort normal and breath sounds normal. She has no wheezes.  Lymphadenopathy:    She has no cervical adenopathy.  Neurological: She is alert and oriented to person, place, and time.  Skin: Skin is warm and dry.  Psychiatric: She has a normal mood and affect. Her behavior is normal.          Assessment & Plan:  Acute sinusitis - Likely viral. If not better by the end of the week ok to fill the antiobiotic.  Recommend nasal sinus rinse and IBU PRN.    Palpitations - Okay to refill metoprolol.  She is doing well on her current regimen.

## 2017-07-04 ENCOUNTER — Telehealth: Payer: Self-pay | Admitting: Family Medicine

## 2017-07-04 DIAGNOSIS — R059 Cough, unspecified: Secondary | ICD-10-CM

## 2017-07-04 DIAGNOSIS — R05 Cough: Secondary | ICD-10-CM

## 2017-07-04 NOTE — Telephone Encounter (Signed)
Pt was seen in office last week for sinusitis. She completed the antibiotic and feels better overall, but is still coughing. She is taking OTC Delsym, but feels like "theres stuff in my chest I just need to cough out." Questions if she should get a chest xray. Routing for review.

## 2017-07-05 ENCOUNTER — Ambulatory Visit (INDEPENDENT_AMBULATORY_CARE_PROVIDER_SITE_OTHER): Payer: Medicare Other

## 2017-07-05 DIAGNOSIS — R05 Cough: Secondary | ICD-10-CM | POA: Diagnosis not present

## 2017-07-05 DIAGNOSIS — J329 Chronic sinusitis, unspecified: Secondary | ICD-10-CM

## 2017-07-05 DIAGNOSIS — R059 Cough, unspecified: Secondary | ICD-10-CM

## 2017-07-05 NOTE — Telephone Encounter (Signed)
Pt advised, she will get this done today.

## 2017-07-05 NOTE — Telephone Encounter (Signed)
I agree. Chest xray ordered. She can go anytime

## 2017-07-16 ENCOUNTER — Other Ambulatory Visit: Payer: Self-pay | Admitting: Family Medicine

## 2017-07-16 DIAGNOSIS — Z139 Encounter for screening, unspecified: Secondary | ICD-10-CM

## 2017-07-24 ENCOUNTER — Other Ambulatory Visit: Payer: Self-pay | Admitting: *Deleted

## 2017-07-24 DIAGNOSIS — M48061 Spinal stenosis, lumbar region without neurogenic claudication: Secondary | ICD-10-CM

## 2017-07-24 DIAGNOSIS — R002 Palpitations: Secondary | ICD-10-CM

## 2017-07-24 MED ORDER — MELOXICAM 15 MG PO TABS: 15.0000 mg | ORAL_TABLET | Freq: Every day | ORAL | 1 refills | Status: DC | PRN

## 2017-07-24 MED ORDER — METOPROLOL TARTRATE 25 MG PO TABS: 25.0000 mg | ORAL_TABLET | Freq: Every day | ORAL | 1 refills | Status: DC

## 2017-07-25 ENCOUNTER — Other Ambulatory Visit: Payer: Self-pay | Admitting: *Deleted

## 2017-07-25 DIAGNOSIS — M48061 Spinal stenosis, lumbar region without neurogenic claudication: Secondary | ICD-10-CM

## 2017-07-25 DIAGNOSIS — R002 Palpitations: Secondary | ICD-10-CM

## 2017-07-25 MED ORDER — METOPROLOL TARTRATE 25 MG PO TABS: 25.0000 mg | ORAL_TABLET | Freq: Every day | ORAL | 1 refills | Status: DC

## 2017-07-25 MED ORDER — MELOXICAM 15 MG PO TABS: 15.0000 mg | ORAL_TABLET | Freq: Every day | ORAL | 1 refills | Status: DC | PRN

## 2017-07-25 NOTE — Progress Notes (Signed)
Called [facility] to have the prescriptions that were sent yesterday cancelled and resent to [facility]..Elouise Munroe, [place]

## 2017-07-30 ENCOUNTER — Other Ambulatory Visit: Payer: Self-pay

## 2017-07-30 ENCOUNTER — Ambulatory Visit (INDEPENDENT_AMBULATORY_CARE_PROVIDER_SITE_OTHER): Payer: Medicare Other | Admitting: Physician Assistant

## 2017-07-30 ENCOUNTER — Encounter: Payer: Self-pay | Admitting: Physician Assistant

## 2017-07-30 VITALS — BP 116/56 | HR 80 | Temp 98.1°F | Wt 172.0 lb

## 2017-07-30 DIAGNOSIS — R002 Palpitations: Secondary | ICD-10-CM

## 2017-07-30 DIAGNOSIS — E890 Postprocedural hypothyroidism: Secondary | ICD-10-CM | POA: Insufficient documentation

## 2017-07-30 DIAGNOSIS — R202 Paresthesia of skin: Secondary | ICD-10-CM | POA: Insufficient documentation

## 2017-07-30 DIAGNOSIS — R232 Flushing: Secondary | ICD-10-CM | POA: Insufficient documentation

## 2017-07-30 MED ORDER — METOPROLOL SUCCINATE ER 25 MG PO TB24: 25.0000 mg | ORAL_TABLET | Freq: Every day | ORAL | 0 refills | Status: DC

## 2017-07-30 NOTE — Patient Instructions (Addendum)
-   Downstairs for labs today - Stop your current prescription for Metoprolol and start the new extended release version.  - Avoid caffeine, nicotine and alcohol - You will be contacted to schedule your echocardiogram - Follow-up with Dr. Jerilynn Mages in 2 weeks   Palpitations A palpitation is the feeling that your heart:  Has an uneven (irregular) heartbeat.  Is beating faster than normal.  Is fluttering.  Is skipping a beat.  This is usually not a serious problem. In some cases, you may need more medical tests. Follow these instructions at home:  Avoid: ? Caffeine in coffee, tea, soft drinks, diet pills, and energy drinks. ? Chocolate. ? Alcohol.  Do not use any tobacco products. These include cigarettes, chewing tobacco, and e-cigarettes. If you need help quitting, ask your doctor.  Try to reduce your stress. These things may help: ? Yoga. ? Meditation. ? Physical activity. Swimming, jogging, and walking are good choices. ? A method that helps you use your mind to control things in your body, like heartbeats (biofeedback).  Get plenty of rest and sleep.  Take over-the-counter and prescription medicines only as told by your doctor.  Keep all follow-up visits as told by your doctor. This is important. Contact a doctor if:  Your heartbeat is still fast or uneven after 24 hours.  Your palpitations occur more often. Get help right away if:  You have chest pain.  You feel short of breath.  You have a very bad headache.  You feel dizzy.  You pass out (faint). This information is not intended to replace advice given to you by your health care provider. Make sure you discuss any questions you have with your health care provider. Document Released: 03/21/2008 Document Revised: 11/18/2015 Document Reviewed: 02/25/2015 Elsevier Interactive Patient Education  [employer].

## 2017-07-30 NOTE — Progress Notes (Signed)
HPI:                                                                Anne [name] is a 67 y.o. female who presents to [place]: [facility] today for palpitations  Pleasant 67 yo F with PMH of Graves disease, postsurgical hypothyroidism, and palpitations presents today for worsening palpitations. She states that she has always had a problem with palpitations and resting HR in the 90's ever since her thyroidectomy a few years ago. However, she recently developed symptoms waking her from sleep including facial flushing, "heart racing," and sensation of hearing her pulse in her ears and feels it in her fingertips. States heart rate will be in the 90's and blood pressure will be elevated in the 295'J systolic. She also endorses fatigue, paresthesias in her fingertips, and muscle twitching. States this started a few months ago, but has been gradually worsening and becoming more intense.   She is currently taking Synthroid 88 mcg daily, last TSH was 0.946 (05/24/17) She also takes Metoprolol 25 mg at noon daily.    Depression screen [facility] 2/9 02/08/2017 02/04/2016 01/25/2015 10/17/2013 09/29/2013  Decreased Interest 0 0 0 0 0  Down, Depressed, Hopeless 0 0 0 0 0  PHQ - 2 Score 0 0 0 0 0    No flowsheet data found.    Past Medical History:  Diagnosis Date  . Acid reflux   . Disc herniation    neck and low back  . Follicular cystitis   . Hernia   . Hiatal hernia   . Hyperlipidemia    ELEV CHOLESTEROL  . Osteopenia 2014   T score -1.6 FRAX 13%/1%  . Schatzki's ring   . Thyroid disease    HYPOTHYROID   Past Surgical History:  Procedure Laterality Date  . BREAST CYST ASPIRATION Right   . BREAST EXCISIONAL BIOPSY Bilateral   . BREAST SURGERY  1997   breast tissue biopsy-benign/.BIOPSIES x 2  . CATARACT EXTRACTION  2011   LEFT  . CATARACT EXTRACTION  [place]   12/20/12 - Dr. Haynes Bast, OD  . [place]   x2 , 1999  .  HYSTEROSCOPY    . LAPAROSCOPIC CHOLECYSTECTOMY  09/28/11  . OOPHORECTOMY  04/2000   LAP RSO/LYSIS OF ADHESIONS  . THYROIDECTOMY  1976  . TUBAL LIGATION  1976  . varicose veins  1972  . VENTRAL HERNIA REPAIR  06/23/13   Dr. Franz Dell   Social History   Tobacco Use  . Smoking status: Former Smoker    Types: Cigarettes    Last attempt to quit: 01/27/1979    Years since quitting: 38.5  . Smokeless tobacco: Never Used  Substance Use Topics  . Alcohol use: Yes    Comment: Rare   family history includes Anuerysm in her father; Arthritis in her daughter and sister; Breast cancer in her sister; Cancer in her sister; Cancer (age of onset: 87) in her brother; Heart attack (age of onset: 25) in her father; Heart disease in her father; Hyperlipidemia in her brother; Hypertension in her brother and sister; Lupus in her sister; Stroke in her sister.    ROS: Review of Systems  Constitutional: Negative.  Respiratory: Negative for shortness of breath.   Cardiovascular: Positive for palpitations. Negative for orthopnea, claudication, leg swelling and PND.  Neurological: Negative for seizures, loss of consciousness and headaches.     Medications: Current Outpatient Medications  Medication Sig Dispense Refill  . aspirin 81 MG tablet Take 81 mg by mouth daily.      [name] CALCIUM PO Take by mouth.    . Cholecalciferol (VITAMIN D) 2000 UNITS tablet Take 2,000 Units by mouth daily.      [name] estradiol (ESTRACE) 0.1 MG/GM vaginal cream Place 4.16 Applicatorfuls vaginally as needed. For vaginal irritation 42.5 g 2  . meloxicam (MOBIC) 15 MG tablet Take 1 tablet (15 mg total) by mouth daily as needed for pain. 90 tablet 1  . metoprolol tartrate (LOPRESSOR) 25 MG tablet Take 1 tablet (25 mg total) by mouth daily. 90 tablet 1  . Multiple Vitamin (MULTIVITAMIN) tablet Take 1 tablet by mouth daily.      [name] omeprazole (PRILOSEC) 40 MG capsule TAKE 1 CAPSULE (40 MG TOTAL) BY MOUTH DAILY. 90 capsule 1  .  simvastatin (ZOCOR) 40 MG tablet TAKE ONE TABLET BY MOUTH ONCE DAILY AT 6 PM 90 tablet 2  . SYNTHROID 88 MCG tablet Take 88 mcg by mouth.     No current facility-administered medications for this visit.    Allergies  Allergen Reactions  . Amoxicillin-Pot Clavulanate     REACTION: blisters in colon  . Augmentin [Amoxicillin-Pot Clavulanate]     Colon blisters  . Ceftriaxone Sodium     REACTION: throat swells (ROCEPHIN)  . Codeine     REACTION: nausea  . Fluconazole In Dextrose     ? INTERACTION WITH SIMVASTATIN  . Molds & Smuts   . Zofran [Ondansetron Hcl]     Headaches       Objective:  BP (!) 116/56   Pulse 80   Temp 98.1 F (36.7 C) (Oral)   Wt 172 lb (78 kg)   LMP 09/08/2000   SpO2 99%   BMI 28.62 kg/m  Gen:  alert, not ill-appearing, no distress, appropriate for age 60: head normocephalic without obvious abnormality, conjunctiva and cornea clear, trachea midline Pulm: Normal work of breathing, normal phonation, clear to auscultation bilaterally, no wheezes, rales or rhonchi CV: Normal rate, regular rhythm, s1 and s2 distinct, no murmurs, clicks or rubs; radial pulses 2+ symmetric, no carotid bruit Neuro: alert and oriented x 3, no tremor MSK: extremities atraumatic, normal gait and station, no peripheral edema Skin: intact, no rashes on exposed skin, no jaundice, no cyanosis Psych: well-groomed, cooperative, good eye contact, euthymic mood, affect mood-congruent, speech is articulate, and thought processes clear and goal-directed    No results found for this or any previous visit (from the past 72 hour(s)). No results found.  ECG 07/30/2017 16:11 Vent Rate 78 bpm PR-I 192 ms QRS 82 ms QT/QTc 392/446 Normal sinus rhythm, Left axis  Assessment and Plan: 66 y.o. female with   1. Intermittent palpitations - differential is broad includes beta blocker withdrawal, medication-induced hyperthyroidism, electrolyte disturbance, anxiety. She also has some  symptoms of hypoparathyroidism, including fatigue and muscle twitching - ECG shows NSR with left axis, no dysrhythmia - switching from Metoprolol to Toprol XL - echo to assess for valvular disease/cardiomyopathy - TSH + free T4 - CBC - Comprehensive metabolic panel - ECHOCARDIOGRAM COMPLETE; Future  2. Flushing   3. Paresthesia of both hands - PTH, Intact and Calcium  4. Postsurgical hypothyroidism - TSH + free T4 -  PTH, Intact and Calcium   Patient education and anticipatory guidance given Patient agrees with treatment plan Follow-up with PCP in 2 weeks as needed if symptoms worsen or fail to improve  Darlyne Russian PA-C

## 2017-07-31 LAB — CBC
HCT: 41.7 % (ref 35.0–45.0)
Hemoglobin: 14.5 g/dL (ref 11.7–15.5)
MCH: 28.4 pg (ref 27.0–33.0)
MCHC: 34.8 g/dL (ref 32.0–36.0)
MCV: 81.8 fL (ref 80.0–100.0)
MPV: 10.9 fL (ref 7.5–12.5)
PLATELETS: 225 10*3/uL (ref 140–400)
RBC: 5.1 10*6/uL (ref 3.80–5.10)
RDW: 13 % (ref 11.0–15.0)
WBC: 6.3 10*3/uL (ref 3.8–10.8)

## 2017-07-31 LAB — COMPREHENSIVE METABOLIC PANEL
AG Ratio: 1.7 (calc) (ref 1.0–2.5)
ALBUMIN MSPROF: 4.5 g/dL (ref 3.6–5.1)
ALKALINE PHOSPHATASE (APISO): 80 U/L (ref 33–130)
ALT: 21 U/L (ref 6–29)
AST: 18 U/L (ref 10–35)
BUN: 10 mg/dL (ref 7–25)
CO2: 31 mmol/L (ref 20–32)
CREATININE: 0.87 mg/dL (ref 0.50–0.99)
Calcium: 9.6 mg/dL (ref 8.6–10.4)
Chloride: 104 mmol/L (ref 98–110)
GLOBULIN: 2.7 g/dL (ref 1.9–3.7)
Glucose, Bld: 96 mg/dL (ref 65–99)
POTASSIUM: 4.2 mmol/L (ref 3.5–5.3)
SODIUM: 141 mmol/L (ref 135–146)
TOTAL PROTEIN: 7.2 g/dL (ref 6.1–8.1)
Total Bilirubin: 0.8 mg/dL (ref 0.2–1.2)

## 2017-07-31 LAB — PTH, INTACT AND CALCIUM
CALCIUM: 9.6 mg/dL (ref 8.6–10.4)
PTH: 52 pg/mL (ref 14–64)

## 2017-07-31 LAB — T4, FREE: Free T4: 1.6 ng/dL (ref 0.8–1.8)

## 2017-07-31 LAB — TSH+FREE T4: TSH W/REFLEX TO FT4: 0.19 mIU/L — ABNORMAL LOW (ref 0.40–4.50)

## 2017-07-31 NOTE — Progress Notes (Signed)
Hi Anne [name],  Your TSH is a bit low. I would recommend discussing this with your endocrinologist and perhaps consider reducing your Levothyroxine to 75 mcg.  Your other labs look good. Parathyroid and calcium are normal.  Best, Evlyn Clines

## 2017-08-01 NOTE — Addendum Note (Signed)
Addended by: Narda Rutherford on: 08/01/2017 10:56 AM   Modules accepted: Orders

## 2017-08-02 ENCOUNTER — Ambulatory Visit: Payer: Medicare Other | Admitting: Family Medicine

## 2017-08-02 ENCOUNTER — Telehealth: Payer: Self-pay | Admitting: *Deleted

## 2017-08-02 ENCOUNTER — Emergency Department (HOSPITAL_BASED_OUTPATIENT_CLINIC_OR_DEPARTMENT_OTHER)
Admission: EM | Admit: 2017-08-02 | Discharge: 2017-08-02 | Disposition: A | Discharge status: Home / Self Care | Payer: Medicare Other | Attending: Emergency Medicine | Admitting: Emergency Medicine

## 2017-08-02 ENCOUNTER — Encounter (HOSPITAL_BASED_OUTPATIENT_CLINIC_OR_DEPARTMENT_OTHER): Payer: Self-pay | Admitting: Emergency Medicine

## 2017-08-02 ENCOUNTER — Other Ambulatory Visit: Payer: Self-pay

## 2017-08-02 DIAGNOSIS — Z79899 Other long term (current) drug therapy: Secondary | ICD-10-CM | POA: Insufficient documentation

## 2017-08-02 DIAGNOSIS — I252 Old myocardial infarction: Secondary | ICD-10-CM | POA: Insufficient documentation

## 2017-08-02 DIAGNOSIS — I10 Essential (primary) hypertension: Secondary | ICD-10-CM | POA: Diagnosis not present

## 2017-08-02 DIAGNOSIS — Z853 Personal history of malignant neoplasm of breast: Secondary | ICD-10-CM | POA: Diagnosis not present

## 2017-08-02 DIAGNOSIS — E0590 Thyrotoxicosis, unspecified without thyrotoxic crisis or storm: Secondary | ICD-10-CM | POA: Diagnosis not present

## 2017-08-02 DIAGNOSIS — R002 Palpitations: Secondary | ICD-10-CM | POA: Diagnosis not present

## 2017-08-02 DIAGNOSIS — Z8673 Personal history of transient ischemic attack (TIA), and cerebral infarction without residual deficits: Secondary | ICD-10-CM | POA: Insufficient documentation

## 2017-08-02 DIAGNOSIS — Z7982 Long term (current) use of aspirin: Secondary | ICD-10-CM | POA: Insufficient documentation

## 2017-08-02 DIAGNOSIS — Z87891 Personal history of nicotine dependence: Secondary | ICD-10-CM | POA: Insufficient documentation

## 2017-08-02 LAB — CBC WITH DIFFERENTIAL/PLATELET
BASOS ABS: 0 10*3/uL (ref 0.0–0.1)
Basophils Relative: 0 %
Eosinophils Absolute: 0.2 10*3/uL (ref 0.0–0.7)
Eosinophils Relative: 3 %
HEMATOCRIT: 39.8 % (ref 36.0–46.0)
Hemoglobin: 13.9 g/dL (ref 12.0–15.0)
LYMPHS ABS: 1.3 10*3/uL (ref 0.7–4.0)
LYMPHS PCT: 20 %
MCH: 28.8 pg (ref 26.0–34.0)
MCHC: 34.9 g/dL (ref 30.0–36.0)
MCV: 82.6 fL (ref 78.0–100.0)
MONO ABS: 0.4 10*3/uL (ref 0.1–1.0)
Monocytes Relative: 7 %
NEUTROS ABS: 4.4 10*3/uL (ref 1.7–7.7)
Neutrophils Relative %: 70 %
Platelets: 175 10*3/uL (ref 150–400)
RBC: 4.82 MIL/uL (ref 3.87–5.11)
RDW: 13.6 % (ref 11.5–15.5)
WBC: 6.3 10*3/uL (ref 4.0–10.5)

## 2017-08-02 LAB — BASIC METABOLIC PANEL
ANION GAP: 10 (ref 5–15)
BUN: 10 mg/dL (ref 6–20)
CALCIUM: 9 mg/dL (ref 8.9–10.3)
CO2: 26 mmol/L (ref 22–32)
Chloride: 104 mmol/L (ref 101–111)
Creatinine, Ser: 0.88 mg/dL (ref 0.44–1.00)
GFR calc Af Amer: >60 mL/min (ref >60)
GFR calc non Af Amer: >60 mL/min (ref >60)
GLUCOSE: 117 mg/dL — AB (ref 65–99)
Potassium: 3.8 mmol/L (ref 3.5–5.1)
Sodium: 140 mmol/L (ref 135–145)

## 2017-08-02 LAB — URINALYSIS, ROUTINE W REFLEX MICROSCOPIC
BILIRUBIN URINE: NEGATIVE
Glucose, UA: NEGATIVE mg/dL
Hgb urine dipstick: NEGATIVE
Ketones, ur: NEGATIVE mg/dL
NITRITE: NEGATIVE
PH: 6 (ref 5.0–8.0)
Protein, ur: NEGATIVE mg/dL
SPECIFIC GRAVITY, URINE: 1.01 (ref 1.005–1.030)

## 2017-08-02 LAB — URINALYSIS, MICROSCOPIC (REFLEX)

## 2017-08-02 LAB — TROPONIN I: Troponin I: <0.03 ng/mL (ref <0.03)

## 2017-08-02 MED ORDER — LEVOTHYROXINE SODIUM 75 MCG PO TABS: 88.0000 ug | ORAL_TABLET | Freq: Every day | ORAL | 0 refills | Status: DC

## 2017-08-02 MED ORDER — SODIUM CHLORIDE 0.9 % IV BOLUS (SEPSIS)
1000.0000 mL | Freq: Once | INTRAVENOUS | Status: AC
  Administered 2017-08-02: 1000 mL via INTRAVENOUS

## 2017-08-02 NOTE — ED Triage Notes (Addendum)
Patient states that she was at home and took her BP and had a "elevated" heart rate - she reports that it was 104 with her home BP check. The patient states that she also doesn't "feel well" - reports that she has a dry throat and mouth and denies any nausea  - patient reports that she called her PMD 2 days ago  and they adjusted her medications based on her lab work. The patient states that today she feels like the "palpitations" are coming more frequently. Denies any other changes

## 2017-08-02 NOTE — Telephone Encounter (Signed)
Pt called this morning wanting you to know that she has not felt any better since her appointment with Evlyn Clines on Monday.  She is still experiencing palpitations and overall feels "off".  Labs were taken and her TSH was low so she was advised to give those results to her Endocrinologist.  He wants to decrease her Synthroid to 24mcg from 28mcg but she hasn't started that dose yet.  She stated that she feels really shaky as well.  Her home bps this morning were 123/72 p104 and 11 minutes later 139/67 p 90. She has an echo scheduled for later this month but she really felt like she needed to be seen so she is going to have her husband drive her to [place] ED.  This is just an [place].

## 2017-08-02 NOTE — Discharge Instructions (Signed)
You have been evaluated for your heart palpitation.  Please resume taking Synthroid at 75 mcg daily and follow-up closely with your primary care provider for further management.  Return if your condition worsen or if you have any other concerns.

## 2017-08-02 NOTE — ED Provider Notes (Signed)
[facility] EMERGENCY DEPARTMENT Provider Note   CSN: [id number] Arrival date & time: 08/02/17  5643     History   Chief Complaint Chief Complaint  Patient presents with  . Palpitations    HPI Anne [name] is a 67 y.o. female.  HPI   67 year old female with history of hypothyroidism, hyperlipidemia, acid reflux presenting complaining of heart palpitation.  Patient report for the past 3-4 days she has been feeling generalized fatigue, lack of appetite, not feel like she wants to do anything.  This morning she woke up with heart palpitation and felt that her heart was racing.  She checked her vital sign and noticed that her heart rate was 104.  She went back to sleep, but was awoke again with heart palpitation which concerns her.  She mentioned having some heart palpitation 4 days ago and was seen by her PCP.  States that they did an EKG and told her that she may need an echocardiogram in a few weeks.  However, patient is concerned of a fast heart rate.  She endorsed some mild nausea without vomiting.  She also noticed having some loose stools yesterday without blood or mucus.  She did have some cold symptoms several weeks prior.  She denies any recent medication changes.  She does take her thyroid medication every morning.  Currently patient denies having any fever, chills, headache, chest pain, productive cough, abdominal pain, or dysuria.  Denies any focal numbness or weakness.  Denies any increased caffeine use.  Past Medical History:  Diagnosis Date  . Acid reflux   . Disc herniation    neck and low back  . Follicular cystitis   . Hernia   . Hiatal hernia   . Hyperlipidemia    ELEV CHOLESTEROL  . Osteopenia 2014   T score -1.6 FRAX 13%/1%  . Schatzki's ring   . Thyroid disease    HYPOTHYROID    Patient Active Problem List   Diagnosis Date Noted  . Flushing 07/30/2017  . Paresthesia of both hands 07/30/2017  . 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  Aortic atherosclerosis ([place]) 04/28/2013  . Schatzki's ring 10/16/2012  . Spinal stenosis of lumbar region 08/16/2012  . Vaginal atrophy 11/14/2011  . Disc herniation   . Osteopenia   . Thyroid disease   . PERSONAL HISTORY OF ALLERGY TO LATEX 12/23/2009  . UNSPECIFIED DISEASE OF PHARYNX 12/16/2008  . FATIGUE 11/10/2008  . Intermittent palpitations 11/10/2008  . Hypothyroidism 09/22/2008  . Hyperlipidemia 09/22/2008  . ALLERGIC RHINITIS 09/22/2008    Past Surgical History:  Procedure Laterality Date  . BREAST CYST ASPIRATION Right   . BREAST EXCISIONAL BIOPSY Bilateral   . BREAST SURGERY  1997   breast tissue biopsy-benign/.BIOPSIES x 2  . CATARACT EXTRACTION  2011   LEFT  . CATARACT EXTRACTION  [facility]   12/20/12 - Dr. Haynes Bast, OD  . [place]   x2 , 1999  . HYSTEROSCOPY    . LAPAROSCOPIC CHOLECYSTECTOMY  09/28/11  . OOPHORECTOMY  04/2000   LAP RSO/LYSIS OF ADHESIONS  . THYROIDECTOMY  1976  . TUBAL LIGATION  1976  . varicose veins  1972  . VENTRAL HERNIA REPAIR  06/23/13   Dr. Franz Dell    OB History    Durenda Age Para Term Preterm AB Living   1 1 1     1    SAB TAB Ectopic Multiple Live Births  1       Home Medications    Prior to Admission medications   Medication Sig Start Date End Date Taking? Authorizing Provider  aspirin 81 MG tablet Take 81 mg by mouth daily.      [provider]  CALCIUM PO Take by mouth.    [provider]  Cholecalciferol (VITAMIN D) 2000 UNITS tablet Take 2,000 Units by mouth daily.      [provider]  estradiol (ESTRACE) 0.1 MG/GM vaginal cream Place 8.29 Applicatorfuls vaginally as needed. For vaginal irritation 06/05/14   Fontaine, Belinda Block, MD  meloxicam (MOBIC) 15 MG tablet Take 1 tablet (15 mg total) by mouth daily as needed for pain. 07/25/17   [name], MD  metoprolol succinate (TOPROL-XL) 25 MG 24 hr tablet Take 1 tablet (25 mg total) by mouth  daily. 07/30/17   Trixie Dredge, PA-C  Multiple Vitamin (MULTIVITAMIN) tablet Take 1 tablet by mouth daily.      [provider]  omeprazole (PRILOSEC) 40 MG capsule TAKE 1 CAPSULE (40 MG TOTAL) BY MOUTH DAILY. 09/06/15   [name], MD  simvastatin (ZOCOR) 40 MG tablet TAKE ONE TABLET BY MOUTH ONCE DAILY AT 6 PM 03/12/17   [name], MD  SYNTHROID 88 MCG tablet Take 88 mcg by mouth. 06/22/17   [provider]    Family History Family History  Problem Relation Age of Onset  . Cancer Brother 40       melanoma  . Stroke Sister   . Hypertension Sister   . Arthritis Sister   . Lupus Sister   . Cancer Sister   . Breast cancer Sister        Age 51  . Hyperlipidemia Brother        deceased  . Hypertension Brother   . Arthritis Daughter        Rheumatoid  . Hyperparathyroidism Daughter   . Heart attack Father 2  . Heart disease Father   . Anuerysm Father     Social History Social History   Tobacco Use  . Smoking status: Former Smoker    Types: Cigarettes    Last attempt to quit: 01/27/1979    Years since quitting: 38.5  . Smokeless tobacco: Never Used  Substance Use Topics  . Alcohol use: Yes    Comment: Rare  . Drug use: No     Allergies   Amoxicillin-pot clavulanate; Augmentin [amoxicillin-pot clavulanate]; Ceftriaxone sodium; Codeine; Fluconazole in dextrose; Molds & smuts; and Zofran [ondansetron hcl]   Review of Systems Review of Systems  All other systems reviewed and are negative.    Physical Exam Updated Vital Signs BP (!) 142/70 (BP Location: Right Arm)   Pulse 91   Temp 98.5 F (36.9 C) (Oral)   Resp 18   Ht 5\' 5"  (1.651 m)   Wt 78 kg (172 lb)   LMP 09/08/2000   SpO2 100%   BMI 28.62 kg/m   Physical Exam  Constitutional: She is oriented to person, place, and time. She appears well-developed and well-nourished. No distress.  Elderly female nontoxic in appearance  HENT:  Head: Atraumatic.    Mouth/Throat: Oropharynx is clear and moist.  Eyes: Conjunctivae are normal.  Neck: Neck supple. No thyromegaly present.  Cardiovascular: Normal rate and regular rhythm.  No murmur heard. Pulmonary/Chest: Effort normal and breath sounds normal.  Abdominal: Soft. She exhibits no distension. There is no tenderness.  Musculoskeletal: She exhibits no edema.  Neurological:  She is alert and oriented to person, place, and time. No cranial nerve deficit or sensory deficit. GCS eye subscore is 4. GCS verbal subscore is 5. GCS motor subscore is 6.  Skin: Skin is dry. No rash noted.  Psychiatric: She has a normal mood and affect.  Nursing note and vitals reviewed.    ED Treatments / Results  Labs (all labs ordered are listed, but only abnormal results are displayed) Labs Reviewed  BASIC METABOLIC PANEL - Abnormal; Notable for the following components:      Result Value   Glucose, Bld 117 (*)    All other components within normal limits  URINALYSIS, ROUTINE W REFLEX MICROSCOPIC - Abnormal; Notable for the following components:   Leukocytes, UA TRACE (*)    All other components within normal limits  URINALYSIS, MICROSCOPIC (REFLEX) - Abnormal; Notable for the following components:   Bacteria, UA RARE (*)    Squamous Epithelial / LPF 0-5 (*)    All other components within normal limits  CBC WITH DIFFERENTIAL/PLATELET  TROPONIN I    EKG  EKG Interpretation  Date/Time:  Thursday August 02 2017 10:09:03 EST Ventricular Rate:  89 PR Interval:    QRS Duration: 103 QT Interval:  379 QTC Calculation: 462 R Axis:   5 Text Interpretation:  Sinus rhythm Low voltage, precordial leads No STEMI.  Confirmed by Nanda Quinton [phone]) on 08/02/2017 10:42:12 AM     ED ECG REPORT   Date: 08/02/2017  Rate: 89  Rhythm: normal sinus rhythm  QRS Axis: normal  Intervals: normal  ST/T Wave abnormalities: normal  Conduction Disutrbances:none  Narrative Interpretation:   Old EKG Reviewed:  unchanged  I have personally reviewed the EKG tracing and agree with the computerized printout as noted.   Radiology No results found.  Procedures Procedures (including critical care time)  Medications Ordered in ED Medications  sodium chloride 0.9 % bolus 1,000 mL (1,000 mLs Intravenous New Bag/Given 08/02/17 1105)     Initial Impression / Assessment and Plan / ED Course  I have reviewed the triage vital signs and the nursing notes.  Pertinent labs & imaging results that were available during my care of the patient were reviewed by me and considered in my medical decision making (see chart for details).     BP (!) 142/70 (BP Location: Right Arm)   Pulse 91   Temp 98.5 F (36.9 C) (Oral)   Resp 18   Ht 5\' 5"  (1.651 m)   Wt 78 kg (172 lb)   LMP 09/08/2000   SpO2 100%   BMI 28.62 kg/m    Final Clinical Impressions(s) / ED Diagnoses   Final diagnoses:  Heart palpitations  Hyperthyroidism    ED Discharge Orders        Ordered    levothyroxine (SYNTHROID, LEVOTHROID) 75 MCG tablet  Daily before breakfast     08/02/17 1208     10:29 AM Patient here with concerns of heart palpitation and generalized fatigue.  Does have history of thyroid disease but have been compliant with her medication.  She is currently not tachycardic and EKG shows sinus rhythm.  Plan to provide screening tests including CBC, BMP, troponin, TSH, free T4, and a UA.  IV fluid given.  12:06 PM Labs are reassuring.  Patient did had a TSH and free T4 test obtained several days prior and her PCP office.  She has a low TSH which suggest pt is having hyperthyroidism.  She was recently had her Synthroid dose  increased from 75 mcg to 88 mcg.  She should resume her previous dose of 75 mcg and follow-up closely with her doctor for recheck.  I suspect her symptoms related to her thyroid disease.  She does not have any signs of atrial fibrillation, or any evidence of high high output cardiac failure at this time.   She is stable for discharge.  Return precautions discussed.  Care discussed with Dr. Laverta Baltimore.   Domenic Moras, PA-C 08/02/17 1210    Margette Fast, MD 08/02/17 [phone]

## 2017-08-02 NOTE — Telephone Encounter (Signed)
OK, I will look out for ED notes.

## 2017-08-13 ENCOUNTER — Ambulatory Visit (INDEPENDENT_AMBULATORY_CARE_PROVIDER_SITE_OTHER): Payer: Medicare Other | Admitting: Family Medicine

## 2017-08-13 ENCOUNTER — Ambulatory Visit: Payer: Medicare Other | Admitting: Family Medicine

## 2017-08-13 ENCOUNTER — Encounter: Payer: Self-pay | Admitting: Family Medicine

## 2017-08-13 VITALS — BP 117/74 | HR 71 | Ht 65.0 in | Wt 171.0 lb

## 2017-08-13 DIAGNOSIS — E890 Postprocedural hypothyroidism: Secondary | ICD-10-CM

## 2017-08-13 DIAGNOSIS — R002 Palpitations: Secondary | ICD-10-CM

## 2017-08-13 DIAGNOSIS — E038 Other specified hypothyroidism: Secondary | ICD-10-CM

## 2017-08-13 NOTE — Progress Notes (Addendum)
Subjective:    Patient ID: Anne [name], female    DOB: 11-29-1950, 67 y.o.   MRN: [id number]  HPI 67 year old female is here today to follow-up for palpitations.  When I last saw her in December she was actually doing well on her metoprolol.  Unfortunately since then she is been having more symptoms.  She started developing facial flushing and heart racing and actually came in to see 1 of my partners on February 4.  They switched her metoprolol to extended release and did some additional lab work which was essentially normal except her TSH was a little low at 0.14.  The recommendation was that her dose likely need to be reduced and to speak with her endocrinologist about it.  Endocrinology did recommend decreasing her dose to 75 mcg daily.  She had a chance to change her dose she actually went to the emergency department a couple of days later on February 7 for the palpitations.  After evaluation they agreed they thought that her symptoms were likely related to her being hyperthyroid on too much medication. She is now on the 75 mcg.     Review of Systems   BP 117/74   Pulse 71   Ht 5\' 5"  (1.651 m)   Wt 171 lb (77.6 kg)   LMP 09/08/2000   SpO2 97%   BMI 28.46 kg/m     Allergies  Allergen Reactions  . Amoxicillin-Pot Clavulanate     REACTION: blisters in colon  . Augmentin [Amoxicillin-Pot Clavulanate]     Colon blisters  . Ceftriaxone Sodium     REACTION: throat swells (ROCEPHIN)  . Codeine     REACTION: nausea  . Fluconazole In Dextrose     ? INTERACTION WITH SIMVASTATIN  . Molds & Smuts   . Zofran [Ondansetron Hcl]     Headaches    Past Medical History:  Diagnosis Date  . Acid reflux   . Disc herniation    neck and low back  . Follicular cystitis   . Hernia   . Hiatal hernia   . Hyperlipidemia    ELEV CHOLESTEROL  . Osteopenia 2014   T score -1.6 FRAX 13%/1%  . Schatzki's ring   . Thyroid disease    HYPOTHYROID    Past Surgical History:  Procedure  Laterality Date  . BREAST CYST ASPIRATION Right   . BREAST EXCISIONAL BIOPSY Bilateral   . BREAST SURGERY  1997   breast tissue biopsy-benign/.BIOPSIES x 2  . CATARACT EXTRACTION  2011   LEFT  . CATARACT EXTRACTION  [place]   12/20/12 - Dr. Haynes Bast, OD  . [place]   x2 , 1999  . HYSTEROSCOPY    . LAPAROSCOPIC CHOLECYSTECTOMY  09/28/11  . OOPHORECTOMY  04/2000   LAP RSO/LYSIS OF ADHESIONS  . THYROIDECTOMY  1976  . TUBAL LIGATION  1976  . varicose veins  1972  . VENTRAL HERNIA REPAIR  06/23/13   Dr. Franz Dell    Social History   Socioeconomic History  . Marital status: Married    Spouse name: [name]  . Number of children: 1  . Years of education: Not on file  . Highest education level: Not on file  Social Needs  . Financial resource strain: Not on file  . Food insecurity - worry: Not on file  . Food insecurity - inability: Not on file  . Transportation needs - medical: Not on file  . Transportation needs - non-medical:  Not on file  Occupational History  . Not on file  Tobacco Use  . Smoking status: Former Smoker    Types: Cigarettes    Last attempt to quit: 01/27/1979    Years since quitting: 38.5  . Smokeless tobacco: Never Used  Substance and Sexual Activity  . Alcohol use: Yes    Comment: Rare  . Drug use: No  . Sexual activity: Yes    Partners: Male    Birth control/protection: Post-menopausal  Other Topics Concern  . Not on file  Social History Narrative   4 brothers and 6 sisters. All 4 brother deceased.  2 sisters deceased.  No regular exercise. Still working.      Family History  Problem Relation Age of Onset  . Cancer Brother 39       melanoma  . Stroke Sister   . Hypertension Sister   . Arthritis Sister   . Lupus Sister   . Cancer Sister   . Breast cancer Sister        Age 4  . Hyperlipidemia Brother        deceased  . Hypertension Brother   . Arthritis Daughter        Rheumatoid  . Hyperparathyroidism  Daughter   . Heart attack Father 57  . Heart disease Father   . Anuerysm Father     Outpatient Encounter Medications as of 08/13/2017  Medication Sig  . aspirin 81 MG tablet Take 81 mg by mouth daily.    [name] CALCIUM PO Take by mouth.  . Cholecalciferol (VITAMIN D) 2000 UNITS tablet Take 2,000 Units by mouth daily.    [name] estradiol (ESTRACE) 0.1 MG/GM vaginal cream Place 4.09 Applicatorfuls vaginally as needed. For vaginal irritation  . levothyroxine (SYNTHROID, LEVOTHROID) 75 MCG tablet Take 1 tablet (75 mcg total) by mouth daily before breakfast.  . meloxicam (MOBIC) 15 MG tablet Take 1 tablet (15 mg total) by mouth daily as needed for pain.  . metoprolol succinate (TOPROL-XL) 25 MG 24 hr tablet Take 1 tablet (25 mg total) by mouth daily.  . Multiple Vitamin (MULTIVITAMIN) tablet Take 1 tablet by mouth daily.    [name] omeprazole (PRILOSEC) 40 MG capsule TAKE 1 CAPSULE (40 MG TOTAL) BY MOUTH DAILY.  . simvastatin (ZOCOR) 40 MG tablet TAKE ONE TABLET BY MOUTH ONCE DAILY AT 6 PM   No facility-administered encounter medications on file as of 08/13/2017.         Objective:   Physical Exam  Constitutional: She is oriented to person, place, and time. She appears well-developed and well-nourished.  HENT:  Head: Normocephalic and atraumatic.  Cardiovascular: Normal rate, regular rhythm and normal heart sounds.  Pulmonary/Chest: Effort normal and breath sounds normal.  Neurological: She is alert and oriented to person, place, and time.  Skin: Skin is warm and dry.  Psychiatric: She has a normal mood and affect. Her behavior is normal.        Assessment & Plan:  Palpitations-doing well.  Palpitations were most likely secondary to overmedication on her thyroid medication.  When she was previously bumped up to 88 mcg it was too much then as well.  She felt the best when she was taking 75 mg daily with an extra half a tab 1 day a week.  Endocrinology we will will be managing this.  We did switch her  to extended release metoprolol and she is been doing well on that so far as well.  They will recheck her level in  a few weeks.  She is scheduled for her echocardiogram next week.

## 2017-08-21 ENCOUNTER — Ambulatory Visit (HOSPITAL_BASED_OUTPATIENT_CLINIC_OR_DEPARTMENT_OTHER)
Admission: RE | Admit: 2017-08-21 | Discharge: 2017-08-21 | Disposition: A | Discharge status: Home / Self Care | Payer: Medicare Other | Source: Ambulatory Visit | Attending: Physician Assistant | Admitting: Physician Assistant

## 2017-08-21 DIAGNOSIS — R002 Palpitations: Secondary | ICD-10-CM | POA: Diagnosis not present

## 2017-08-21 DIAGNOSIS — E785 Hyperlipidemia, unspecified: Secondary | ICD-10-CM | POA: Insufficient documentation

## 2017-08-21 NOTE — Progress Notes (Signed)
Echocardiogram 2D Echocardiogram has been performed.  Anne [name] 08/21/2017, 4:12 PM

## 2017-08-21 NOTE — Progress Notes (Signed)
Hi Anne [name],  Your echocardiogram shows that your heart is healthy and pumping well. You have very mild backflow over 2 of your heart valves; this is not likely to ever be problematic and would not cause your symptoms.  Best, Evlyn Clines

## 2017-08-30 ENCOUNTER — Inpatient Hospital Stay (HOSPITAL_COMMUNITY): Payer: Medicare Other

## 2017-08-30 ENCOUNTER — Inpatient Hospital Stay (HOSPITAL_BASED_OUTPATIENT_CLINIC_OR_DEPARTMENT_OTHER)
Admission: EM | Admit: 2017-08-30 | Discharge: 2017-08-31 | DRG: 309 | Disposition: A | Discharge status: Home / Self Care | Payer: Medicare Other | Attending: Internal Medicine | Admitting: Internal Medicine

## 2017-08-30 ENCOUNTER — Other Ambulatory Visit: Payer: Self-pay

## 2017-08-30 ENCOUNTER — Emergency Department (HOSPITAL_BASED_OUTPATIENT_CLINIC_OR_DEPARTMENT_OTHER): Payer: Medicare Other

## 2017-08-30 ENCOUNTER — Encounter (HOSPITAL_BASED_OUTPATIENT_CLINIC_OR_DEPARTMENT_OTHER): Payer: Self-pay

## 2017-08-30 DIAGNOSIS — E785 Hyperlipidemia, unspecified: Secondary | ICD-10-CM | POA: Diagnosis not present

## 2017-08-30 DIAGNOSIS — R079 Chest pain, unspecified: Secondary | ICD-10-CM | POA: Diagnosis present

## 2017-08-30 DIAGNOSIS — Z91048 Other nonmedicinal substance allergy status: Secondary | ICD-10-CM

## 2017-08-30 DIAGNOSIS — Z8719 Personal history of other diseases of the digestive system: Secondary | ICD-10-CM | POA: Diagnosis not present

## 2017-08-30 DIAGNOSIS — E890 Postprocedural hypothyroidism: Secondary | ICD-10-CM | POA: Diagnosis present

## 2017-08-30 DIAGNOSIS — E039 Hypothyroidism, unspecified: Secondary | ICD-10-CM

## 2017-08-30 DIAGNOSIS — R0781 Pleurodynia: Secondary | ICD-10-CM | POA: Diagnosis present

## 2017-08-30 DIAGNOSIS — I4891 Unspecified atrial fibrillation: Secondary | ICD-10-CM | POA: Diagnosis not present

## 2017-08-30 DIAGNOSIS — I248 Other forms of acute ischemic heart disease: Secondary | ICD-10-CM | POA: Diagnosis present

## 2017-08-30 DIAGNOSIS — D72829 Elevated white blood cell count, unspecified: Secondary | ICD-10-CM | POA: Diagnosis present

## 2017-08-30 DIAGNOSIS — R05 Cough: Secondary | ICD-10-CM | POA: Diagnosis present

## 2017-08-30 DIAGNOSIS — Z880 Allergy status to penicillin: Secondary | ICD-10-CM | POA: Diagnosis not present

## 2017-08-30 DIAGNOSIS — Z79899 Other long term (current) drug therapy: Secondary | ICD-10-CM

## 2017-08-30 DIAGNOSIS — I5032 Chronic diastolic (congestive) heart failure: Secondary | ICD-10-CM | POA: Diagnosis not present

## 2017-08-30 DIAGNOSIS — R791 Abnormal coagulation profile: Secondary | ICD-10-CM | POA: Diagnosis present

## 2017-08-30 DIAGNOSIS — Z8679 Personal history of other diseases of the circulatory system: Secondary | ICD-10-CM | POA: Diagnosis present

## 2017-08-30 DIAGNOSIS — Z79890 Hormone replacement therapy (postmenopausal): Secondary | ICD-10-CM | POA: Diagnosis not present

## 2017-08-30 DIAGNOSIS — R197 Diarrhea, unspecified: Secondary | ICD-10-CM | POA: Diagnosis not present

## 2017-08-30 DIAGNOSIS — R7989 Other specified abnormal findings of blood chemistry: Secondary | ICD-10-CM | POA: Diagnosis present

## 2017-08-30 DIAGNOSIS — I480 Paroxysmal atrial fibrillation: Secondary | ICD-10-CM | POA: Diagnosis present

## 2017-08-30 DIAGNOSIS — R112 Nausea with vomiting, unspecified: Secondary | ICD-10-CM | POA: Diagnosis not present

## 2017-08-30 DIAGNOSIS — Z885 Allergy status to narcotic agent status: Secondary | ICD-10-CM

## 2017-08-30 DIAGNOSIS — A419 Sepsis, unspecified organism: Secondary | ICD-10-CM | POA: Diagnosis present

## 2017-08-30 DIAGNOSIS — M545 Low back pain: Secondary | ICD-10-CM | POA: Diagnosis present

## 2017-08-30 DIAGNOSIS — Z7982 Long term (current) use of aspirin: Secondary | ICD-10-CM | POA: Diagnosis not present

## 2017-08-30 DIAGNOSIS — G8929 Other chronic pain: Secondary | ICD-10-CM | POA: Diagnosis present

## 2017-08-30 DIAGNOSIS — R002 Palpitations: Secondary | ICD-10-CM

## 2017-08-30 DIAGNOSIS — M48061 Spinal stenosis, lumbar region without neurogenic claudication: Secondary | ICD-10-CM

## 2017-08-30 DIAGNOSIS — I071 Rheumatic tricuspid insufficiency: Secondary | ICD-10-CM | POA: Diagnosis not present

## 2017-08-30 DIAGNOSIS — Z888 Allergy status to other drugs, medicaments and biological substances status: Secondary | ICD-10-CM

## 2017-08-30 DIAGNOSIS — Z881 Allergy status to other antibiotic agents status: Secondary | ICD-10-CM

## 2017-08-30 DIAGNOSIS — M8580 Other specified disorders of bone density and structure, unspecified site: Secondary | ICD-10-CM | POA: Diagnosis present

## 2017-08-30 DIAGNOSIS — I110 Hypertensive heart disease with heart failure: Secondary | ICD-10-CM | POA: Diagnosis present

## 2017-08-30 DIAGNOSIS — Z87891 Personal history of nicotine dependence: Secondary | ICD-10-CM | POA: Diagnosis not present

## 2017-08-30 DIAGNOSIS — I371 Nonrheumatic pulmonary valve insufficiency: Secondary | ICD-10-CM | POA: Diagnosis present

## 2017-08-30 DIAGNOSIS — R0789 Other chest pain: Secondary | ICD-10-CM | POA: Diagnosis not present

## 2017-08-30 DIAGNOSIS — K219 Gastro-esophageal reflux disease without esophagitis: Secondary | ICD-10-CM | POA: Diagnosis not present

## 2017-08-30 DIAGNOSIS — J189 Pneumonia, unspecified organism: Secondary | ICD-10-CM | POA: Diagnosis present

## 2017-08-30 HISTORY — DX: Low back pain, unspecified: M54.50

## 2017-08-30 HISTORY — DX: Low back pain: M54.5

## 2017-08-30 HISTORY — DX: Hypothyroidism, unspecified: E03.9

## 2017-08-30 HISTORY — DX: Pure hypercholesterolemia, unspecified: E78.00

## 2017-08-30 HISTORY — DX: Other chronic pain: G89.29

## 2017-08-30 HISTORY — DX: Unspecified osteoarthritis, unspecified site: M19.90

## 2017-08-30 HISTORY — DX: Family history of other specified conditions: Z84.89

## 2017-08-30 HISTORY — DX: Paroxysmal atrial fibrillation: I48.0

## 2017-08-30 LAB — CBC
HCT: 42.3 % (ref 36.0–46.0)
Hemoglobin: 15 g/dL (ref 12.0–15.0)
MCH: 28.8 pg (ref 26.0–34.0)
MCHC: 35.5 g/dL (ref 30.0–36.0)
MCV: 81.2 fL (ref 78.0–100.0)
Platelets: 194 10*3/uL (ref 150–400)
RBC: 5.21 MIL/uL — AB (ref 3.87–5.11)
RDW: 14 % (ref 11.5–15.5)
WBC: 13.4 10*3/uL — AB (ref 4.0–10.5)

## 2017-08-30 LAB — URINALYSIS, ROUTINE W REFLEX MICROSCOPIC
Bilirubin Urine: NEGATIVE
Glucose, UA: NEGATIVE mg/dL
Hgb urine dipstick: NEGATIVE
Ketones, ur: NEGATIVE mg/dL
LEUKOCYTES UA: NEGATIVE
Nitrite: NEGATIVE
PROTEIN: NEGATIVE mg/dL
Specific Gravity, Urine: <1.005 — ABNORMAL LOW (ref 1.005–1.030)
pH: 6 (ref 5.0–8.0)

## 2017-08-30 LAB — LIPASE, BLOOD
LIPASE: 36 U/L (ref 11–51)
Lipase: 26 U/L (ref 11–51)

## 2017-08-30 LAB — COMPREHENSIVE METABOLIC PANEL
ALK PHOS: 81 U/L (ref 38–126)
ALT: 19 U/L (ref 14–54)
AST: 30 U/L (ref 15–41)
Albumin: 4.2 g/dL (ref 3.5–5.0)
Anion gap: 13 (ref 5–15)
BUN: 10 mg/dL (ref 6–20)
CALCIUM: 9.2 mg/dL (ref 8.9–10.3)
CO2: 19 mmol/L — ABNORMAL LOW (ref 22–32)
CREATININE: 0.9 mg/dL (ref 0.44–1.00)
Chloride: 104 mmol/L (ref 101–111)
Glucose, Bld: 173 mg/dL — ABNORMAL HIGH (ref 65–99)
Potassium: 3.5 mmol/L (ref 3.5–5.1)
Sodium: 136 mmol/L (ref 135–145)
Total Bilirubin: 0.6 mg/dL (ref 0.3–1.2)
Total Protein: 7.6 g/dL (ref 6.5–8.1)

## 2017-08-30 LAB — GLUCOSE, CAPILLARY: Glucose-Capillary: 108 mg/dL — ABNORMAL HIGH (ref 65–99)

## 2017-08-30 LAB — TROPONIN I: TROPONIN I: 0.07 ng/mL — AB (ref <0.03)

## 2017-08-30 LAB — TSH: TSH: 0.24 u[IU]/mL — ABNORMAL LOW (ref 0.350–4.500)

## 2017-08-30 LAB — PROCALCITONIN: Procalcitonin: <0.1 ng/mL

## 2017-08-30 LAB — LACTIC ACID, PLASMA
Lactic Acid, Venous: 2.3 mmol/L (ref 0.5–1.9)
Lactic Acid, Venous: 1.3 mmol/L (ref 0.5–1.9)

## 2017-08-30 LAB — MAGNESIUM: Magnesium: 2 mg/dL (ref 1.7–2.4)

## 2017-08-30 LAB — D-DIMER, QUANTITATIVE: D-Dimer, Quant: 0.55 ug/mL-FEU — ABNORMAL HIGH (ref 0.00–0.50)

## 2017-08-30 LAB — BRAIN NATRIURETIC PEPTIDE: B Natriuretic Peptide: 341.6 pg/mL — ABNORMAL HIGH (ref 0.0–100.0)

## 2017-08-30 MED ORDER — PROMETHAZINE HCL 25 MG/ML IJ SOLN
INTRAMUSCULAR | Status: AC
  Filled 2017-08-30: qty 1

## 2017-08-30 MED ORDER — LEVOTHYROXINE SODIUM 50 MCG PO TABS
50.0000 ug | ORAL_TABLET | Freq: Every day | ORAL | Status: DC
  Administered 2017-08-31: 50 ug via ORAL
  Filled 2017-08-30: qty 1

## 2017-08-30 MED ORDER — ADULT MULTIVITAMIN W/MINERALS CH
1.0000 | ORAL_TABLET | Freq: Every day | ORAL | Status: DC
  Administered 2017-08-31: 1 via ORAL
  Filled 2017-08-30 (×2): qty 1

## 2017-08-30 MED ORDER — POTASSIUM CHLORIDE CRYS ER 20 MEQ PO TBCR
40.0000 meq | EXTENDED_RELEASE_TABLET | Freq: Once | ORAL | Status: AC
  Administered 2017-08-30: 40 meq via ORAL
  Filled 2017-08-30: qty 2

## 2017-08-30 MED ORDER — ASPIRIN 81 MG PO CHEW
324.0000 mg | CHEWABLE_TABLET | Freq: Once | ORAL | Status: AC
  Administered 2017-08-30: 324 mg via ORAL
  Filled 2017-08-30: qty 4

## 2017-08-30 MED ORDER — MORPHINE SULFATE (PF) 2 MG/ML IV SOLN
2.0000 mg | INTRAVENOUS | Status: DC | PRN
  Administered 2017-08-30: 2 mg via INTRAVENOUS
  Filled 2017-08-30: qty 1

## 2017-08-30 MED ORDER — ZOLPIDEM TARTRATE 5 MG PO TABS: 5.0000 mg | ORAL_TABLET | Freq: Every evening | ORAL | Status: DC | PRN

## 2017-08-30 MED ORDER — PROMETHAZINE HCL 25 MG/ML IJ SOLN: 6.2500 mg | Freq: Three times a day (TID) | INTRAMUSCULAR | Status: DC | PRN

## 2017-08-30 MED ORDER — HEPARIN (PORCINE) IN NACL 100-0.45 UNIT/ML-% IJ SOLN
1150.0000 [IU]/h | INTRAMUSCULAR | Status: DC
  Filled 2017-08-30: qty 250

## 2017-08-30 MED ORDER — LOPERAMIDE HCL 2 MG PO CAPS: 2.0000 mg | ORAL_CAPSULE | Freq: Three times a day (TID) | ORAL | Status: DC | PRN

## 2017-08-30 MED ORDER — ALUM & MAG HYDROXIDE-SIMETH 200-200-20 MG/5ML PO SUSP
30.0000 mL | Freq: Four times a day (QID) | ORAL | Status: DC | PRN
Start: 2017-08-30 — End: 2017-08-31

## 2017-08-30 MED ORDER — SODIUM CHLORIDE 0.9 % IV SOLN
INTRAVENOUS | Status: DC
Start: 2017-08-30 — End: 2017-08-30
  Administered 2017-08-30: 04:00:00 via INTRAVENOUS

## 2017-08-30 MED ORDER — SODIUM CHLORIDE 0.9 % IV BOLUS (SEPSIS)
1000.0000 mL | Freq: Once | INTRAVENOUS | Status: AC
  Administered 2017-08-30: 1000 mL via INTRAVENOUS

## 2017-08-30 MED ORDER — LEVOTHYROXINE SODIUM 88 MCG PO TABS
88.0000 ug | ORAL_TABLET | Freq: Every day | ORAL | Status: DC
  Filled 2017-08-30: qty 1

## 2017-08-30 MED ORDER — ASPIRIN 81 MG PO CHEW
81.0000 mg | CHEWABLE_TABLET | Freq: Every day | ORAL | Status: DC
  Administered 2017-08-31: 81 mg via ORAL
  Filled 2017-08-30: qty 1

## 2017-08-30 MED ORDER — CALCIUM 500 MG PO TABS: 1.0000 | ORAL_TABLET | Freq: Every day | ORAL | Status: DC

## 2017-08-30 MED ORDER — SODIUM CHLORIDE 0.9 % IV SOLN
INTRAVENOUS | Status: DC
  Administered 2017-08-30: 20:00:00 via INTRAVENOUS

## 2017-08-30 MED ORDER — VITAMIN D 1000 UNITS PO TABS
2000.0000 [IU] | ORAL_TABLET | Freq: Every day | ORAL | Status: DC
  Administered 2017-08-31: 2000 [IU] via ORAL
  Filled 2017-08-30 (×2): qty 2

## 2017-08-30 MED ORDER — MELOXICAM 7.5 MG PO TABS
15.0000 mg | ORAL_TABLET | Freq: Every day | ORAL | Status: DC | PRN
  Administered 2017-08-30: 15 mg via ORAL
  Filled 2017-08-30: qty 2

## 2017-08-30 MED ORDER — DILTIAZEM LOAD VIA INFUSION
10.0000 mg | Freq: Once | INTRAVENOUS | Status: AC
  Administered 2017-08-30: 10 mg via INTRAVENOUS
  Filled 2017-08-30: qty 10

## 2017-08-30 MED ORDER — PANTOPRAZOLE SODIUM 40 MG PO TBEC
40.0000 mg | DELAYED_RELEASE_TABLET | Freq: Every day | ORAL | Status: DC
  Administered 2017-08-30 – 2017-08-31 (×2): 40 mg via ORAL
  Filled 2017-08-30 (×2): qty 1

## 2017-08-30 MED ORDER — METOPROLOL SUCCINATE ER 25 MG PO TB24
25.0000 mg | ORAL_TABLET | Freq: Every day | ORAL | Status: DC
  Administered 2017-08-31: 25 mg via ORAL
  Filled 2017-08-30: qty 1

## 2017-08-30 MED ORDER — SODIUM CHLORIDE 0.9 % IV BOLUS (SEPSIS)
500.0000 mL | Freq: Once | INTRAVENOUS | Status: AC
  Administered 2017-08-30: 500 mL via INTRAVENOUS

## 2017-08-30 MED ORDER — PROMETHAZINE HCL 25 MG/ML IJ SOLN
12.5000 mg | Freq: Once | INTRAMUSCULAR | Status: AC
  Administered 2017-08-30: 12.5 mg via INTRAVENOUS

## 2017-08-30 MED ORDER — DILTIAZEM HCL 100 MG IV SOLR
5.0000 mg/h | INTRAVENOUS | Status: DC
  Administered 2017-08-30: 5 mg/h via INTRAVENOUS
  Filled 2017-08-30 (×3): qty 100

## 2017-08-30 MED ORDER — IOPAMIDOL (ISOVUE-370) INJECTION 76%
INTRAVENOUS | Status: AC
  Administered 2017-08-30: 100 mL
  Filled 2017-08-30: qty 100

## 2017-08-30 MED ORDER — SIMVASTATIN 40 MG PO TABS
40.0000 mg | ORAL_TABLET | Freq: Every day | ORAL | Status: DC
  Administered 2017-08-30: 40 mg via ORAL
  Filled 2017-08-30 (×2): qty 1

## 2017-08-30 MED ORDER — NITROGLYCERIN 0.4 MG SL SUBL
0.4000 mg | SUBLINGUAL_TABLET | SUBLINGUAL | Status: DC | PRN
  Filled 2017-08-30: qty 1

## 2017-08-30 MED ORDER — HEPARIN BOLUS VIA INFUSION
4000.0000 [IU] | Freq: Once | INTRAVENOUS | Status: AC
  Administered 2017-08-30: 4000 [IU] via INTRAVENOUS
  Filled 2017-08-30: qty 4000

## 2017-08-30 MED ORDER — PROMETHAZINE HCL 25 MG/ML IJ SOLN: 12.5000 mg | Freq: Four times a day (QID) | INTRAMUSCULAR | Status: DC | PRN

## 2017-08-30 MED ORDER — ALPRAZOLAM 0.25 MG PO TABS: 0.2500 mg | ORAL_TABLET | Freq: Two times a day (BID) | ORAL | Status: DC | PRN

## 2017-08-30 MED ORDER — ACETAMINOPHEN 325 MG PO TABS: 650.0000 mg | ORAL_TABLET | ORAL | Status: DC | PRN

## 2017-08-30 NOTE — H&P (Addendum)
History and Physical    Anne [name] OFB:[id number] DOB: Oct 01, 1950 DOA: 08/30/2017  Referring MD/NP/PA:   PCP: [name], MD   Patient coming from:  The patient is coming from home.  At baseline, pt is independent for most of ADL.  Chief Complaint: Palpitation, chest pain  HPI: Anne [name] is a 67 y.o. female with medical history significant of dCHF (by 2D echo), hyperlipidemia, GERD, hypothyroidism, who presents with palpitation and chest pain.  Patient states that she has been having intermittent palpitations for more than 4 weeks. She was seen by PCP 4 weeks ago and had EKG which showed "extra beat", but did not mention A fib. She was told that she probably takes too much Synthroid. Her doctor decreased dose of Synthroid dose from 80 to 75 g daily. She had 2-D echo on 2/26, which showed EF of 55-60 percent with grade 1 diastolic dysfunction. She was also started on metoprolol. Patient states that initially she feels better, but since yesterday, her palpitation has worsened. She also developed chest pain, which is located in the left lower chest, under left rib cage, which is constant, sharp, cramping-like, nonradiating, pleuritic, aggravated by deep breath. Patient does not have SOB. No recent long distant traveling, no tenderness in the calf areas. She uses Estradiol cream intermittently. She has mild dry cough, but no fever or chills. Patient states that 3 weeks ago, she had flulike symptoms, which has resolved currently. Today she has nausea and vomited 3 times, without blood in the vomitus. She also had loose stool, but denies watery stool or diarrhea. Currently she has nausea, no vomiting, diarrhea. She has mild epigastric abdominal discomfort. No symptoms of UTI or unilateral weakness. No fever or chills.  ED Course: pt was found to have TSH 0.240, WBC 13.4, negative troponin, negative urinalysis, electrolytes renal function okay, temperature 99.3, tachycardia,  tachypnea, oxygen satting 96% on room air, negative chest x-ray. EKG showed new atrial fibrillation with RVR. Patient is admitted to stepdown as inpatient. IV Cardizem started.  Review of Systems:   General: no fevers, chills, no body weight gain, fatigue HEENT: no blurry vision, hearing changes or sore throat Respiratory: no dyspnea, has mild coughing, no wheezing CV: has chest pain and palpitations GI: has nausea, vomiting, abdominal pain, no diarrhea, constipation GU: no dysuria, burning on urination, increased urinary frequency, hematuria  Ext: no leg edema Neuro: no unilateral weakness, numbness, or tingling, no vision change or hearing loss Skin: no rash, no skin tear. MSK: No muscle spasm, no deformity, no limitation of range of movement in spin Heme: No easy bruising.  Travel history: No recent long distant travel.  Allergy:  Allergies  Allergen Reactions  . Amoxicillin-Pot Clavulanate     REACTION: blisters in colon  . Augmentin [Amoxicillin-Pot Clavulanate]     Colon blisters  . Ceftriaxone Sodium     REACTION: throat swells (ROCEPHIN)  . Codeine     REACTION: nausea  . Fluconazole In Dextrose     ? INTERACTION WITH SIMVASTATIN  . Molds & Smuts   . Zofran [Ondansetron Hcl]     Headaches    Past Medical History:  Diagnosis Date  . Acid reflux   . Arthritis    "joints, hands" (08/30/2017)  . Atrial fibrillation with RVR ([place]) 08/30/2017  . Chronic lower back pain   . Disc herniation    neck and low back  . Family history of adverse reaction to anesthesia    "daughter w/PONV"  .  Follicular cystitis   . Hiatal hernia   . High cholesterol   . Hyperlipidemia    ELEV CHOLESTEROL  . Hypothyroidism   . Osteopenia 2014   T score -1.6 FRAX 13%/1%  . PONV (postoperative nausea and vomiting)   . Schatzki's ring     Past Surgical History:  Procedure Laterality Date  . BREAST BIOPSY Bilateral 1997   "excisional bx both benign"  . BREAST CYST ASPIRATION Right ~  1993  . CATARACT EXTRACTION W/ INTRAOCULAR LENS IMPLANT Left 2011  . CATARACT EXTRACTION W/ INTRAOCULAR LENS IMPLANT Right 12/20/2012   Dr. Haynes Bast, OD  . COLONOSCOPY WITH ESOPHAGOGASTRODUODENOSCOPY (EGD)  "several"   "? dilated esophagus" (08/30/2017)  . DILATION AND CURETTAGE OF UTERUS  1996  . EYE MUSCLE SURGERY Bilateral 1992  . HERNIA REPAIR    . HYSTEROSCOPY    . LAPAROSCOPIC CHOLECYSTECTOMY  09/28/2011  . OOPHORECTOMY Right 04/2000   LAP RSO/LYSIS OF ADHESIONS  . THYROIDECTOMY  1976  . TUBAL LIGATION  1976  . VARICOSE VEIN SURGERY Bilateral 1972  . VENTRAL HERNIA REPAIR  06/23/13   Dr. Franz Dell    Social History:  reports that she quit smoking about 38 years ago. Her smoking use included cigarettes. She has a 9.00 pack-year smoking history. she has never used smokeless tobacco. She reports that she drinks alcohol. She reports that she does not use drugs.  Family History:  Family History  Problem Relation Age of Onset  . Cancer Brother 53       melanoma  . Stroke Sister   . Hypertension Sister   . Arthritis Sister   . Lupus Sister   . Cancer Sister   . Breast cancer Sister        Age 36  . Hyperlipidemia Brother        deceased  . Hypertension Brother   . Arthritis Daughter        Rheumatoid  . Hyperparathyroidism Daughter   . Heart attack Father 102  . Heart disease Father   . Anuerysm Father      Prior to Admission medications   Medication Sig Start Date End Date Taking? Authorizing Provider  aspirin 81 MG tablet Take 81 mg by mouth daily.      [provider]  CALCIUM PO Take by mouth.    [provider]  Cholecalciferol (VITAMIN D) 2000 UNITS tablet Take 2,000 Units by mouth daily.      [provider]  estradiol (ESTRACE) 0.1 MG/GM vaginal cream Place 6.07 Applicatorfuls vaginally as needed. For vaginal irritation 06/05/14   Fontaine, Belinda Block, MD  levothyroxine (SYNTHROID, LEVOTHROID) 75 MCG tablet Take 1 tablet (75 mcg  total) by mouth daily before breakfast. 08/02/17   Domenic Moras, PA-C  meloxicam (MOBIC) 15 MG tablet Take 1 tablet (15 mg total) by mouth daily as needed for pain. 07/25/17   [name], MD  metoprolol succinate (TOPROL-XL) 25 MG 24 hr tablet Take 1 tablet (25 mg total) by mouth daily. 07/30/17   Trixie Dredge, PA-C  Multiple Vitamin (MULTIVITAMIN) tablet Take 1 tablet by mouth daily.      [provider]  omeprazole (PRILOSEC) 40 MG capsule TAKE 1 CAPSULE (40 MG TOTAL) BY MOUTH DAILY. 09/06/15   [name], MD  simvastatin (ZOCOR) 40 MG tablet TAKE ONE TABLET BY MOUTH ONCE DAILY AT 6 PM 03/12/17   [name], MD    Physical Exam: Vitals:   08/30/17 2232 08/31/17  0000 08/31/17 0004 08/31/17 0100  BP:  106/70  100/60  Pulse: (!) 104 (!) 125 (!) 111 78  Resp:  20  20  Temp:  98.7 F (37.1 C)    TempSrc:  Oral    SpO2: 94% 95% 95% 96%  Weight:      Height:       General: Not in acute distress HEENT:       Eyes: PERRL, EOMI, no scleral icterus.       ENT: No discharge from the ears and nose, no pharynx injection, no tonsillar enlargement.        Neck: No JVD, no bruit, no mass felt. Heme: No neck lymph node enlargement. Cardiac: S1/S2, irregularly irregular rhythm, No murmurs, No gallops or rubs. Respiratory:  No rales, wheezing, rhonchi or rubs. GI: Soft, nondistended, mild tenderness in epigastric area, no rebound pain, no organomegaly, BS present. GU: No hematuria Ext: No pitting leg edema bilaterally. 2+DP/PT pulse bilaterally. Musculoskeletal: No joint deformities, No joint redness or warmth, no limitation of ROM in spin. Skin: No rashes.  Neuro: Alert, oriented X3, cranial nerves II-XII grossly intact, moves all extremities normally. Psych: Patient is not psychotic, no suicidal or hemocidal ideation.  Labs on Admission: I have personally reviewed following labs and imaging studies  CBC: Recent Labs  Lab 08/30/17 0330  WBC  13.4*  HGB 15.0  HCT 42.3  MCV 81.2  PLT 623   Basic Metabolic Panel: Recent Labs  Lab 08/30/17 0330  NA 136  K 3.5  CL 104  CO2 19*  GLUCOSE 173*  BUN 10  CREATININE 0.90  CALCIUM 9.2  MG 2.0   GFR: Estimated Creatinine Clearance: 61.5 mL/min (by C-G formula based on SCr of 0.9 mg/dL). Liver Function Tests: Recent Labs  Lab 08/30/17 0330  AST 30  ALT 19  ALKPHOS 81  BILITOT 0.6  PROT 7.6  ALBUMIN 4.2   Recent Labs  Lab 08/30/17 0330 08/30/17 2042  LIPASE 36 26   No results for input(s): AMMONIA in the last 168 hours. Coagulation Profile: No results for input(s): INR, PROTIME in the last 168 hours. Cardiac Enzymes: Recent Labs  Lab 08/30/17 0330 08/30/17 2042  TROPONINI <0.03 0.07*   BNP (last 3 results) No results for input(s): PROBNP in the last 8760 hours. HbA1C: No results for input(s): HGBA1C in the last 72 hours. CBG: Recent Labs  Lab 08/30/17 2130  GLUCAP 108*   Lipid Profile: No results for input(s): CHOL, HDL, LDLCALC, TRIG, CHOLHDL, LDLDIRECT in the last 72 hours. Thyroid Function Tests: Recent Labs    08/30/17 0404  TSH 0.240*   Anemia Panel: No results for input(s): VITAMINB12, FOLATE, FERRITIN, TIBC, IRON, RETICCTPCT in the last 72 hours. Urine analysis:    Component Value Date/Time   COLORURINE YELLOW 08/30/2017 [place] 08/30/2017 0549   LABSPEC <1.005 (L) 08/30/2017 0549   PHURINE 6.0 08/30/2017 0549   GLUCOSEU NEGATIVE 08/30/2017 0549   HGBUR NEGATIVE 08/30/2017 0549   HGBUR negative 02/25/2009 1537   BILIRUBINUR NEGATIVE 08/30/2017 0549   BILIRUBINUR neg 07/14/2011 [place] 08/30/2017 0549   PROTEINUR NEGATIVE 08/30/2017 0549   UROBILINOGEN 0.2 06/05/2014 0917   NITRITE NEGATIVE 08/30/2017 0549   LEUKOCYTESUR NEGATIVE 08/30/2017 0549   Sepsis Labs: @LABRCNTIP (procalcitonin:4,lacticidven:4) )No results found for this or any previous visit (from the past 240 hour(s)).    Radiological Exams on Admission: Dg Chest 2 View  Result Date: 08/30/2017 CLINICAL DATA:  Atrial fibrillation.  Awoke with left breast pain. EXAM: CHEST - 2 VIEW COMPARISON:  Radiograph 07/05/2017 FINDINGS: The cardiomediastinal contours are normal. The lungs are clear. Pulmonary vasculature is normal. No consolidation, pleural effusion, or pneumothorax. No acute osseous abnormalities are seen. EKG leads overlie the chest. IMPRESSION: No acute pulmonary process. Electronically Signed   By: Jeb Levering M.D.   On: 08/30/2017 04:14   Ct Angio Chest Pe W Or Wo Contrast  Result Date: 08/31/2017 CLINICAL DATA:  Left-sided chest pain.  Elevated D-dimer. EXAM: CT ANGIOGRAPHY CHEST WITH CONTRAST TECHNIQUE: Multidetector CT imaging of the chest was performed using the standard protocol during bolus administration of intravenous contrast. Multiplanar CT image reconstructions and MIPs were obtained to evaluate the vascular anatomy. CONTRAST:  181mL ISOVUE-370 IOPAMIDOL (ISOVUE-370) INJECTION 76% COMPARISON:  Radiograph earlier this day. FINDINGS: Cardiovascular: There are no filling defects within the pulmonary arteries to suggest pulmonary embolus. Cannot assess for dissection given phase of contrast. Mild aortic calcifications. No aortic aneurysm. Heart size is normal. No pericardial fluid. Mediastinum/Nodes: Prominent right hilar and infrahilar nodes measure less than 1 cm short axis. The esophagus is decompressed. Post thyroidectomy. Lungs/Pleura: Breathing motion artifact. Dependent atelectasis in both lower lobes. Subtle ground-glass opacities in the upper lobes. No confluent consolidation. No evidence pulmonary edema. No pleural fluid. Upper Abdomen: Postcholecystectomy.  No acute finding. Musculoskeletal: There are no acute or suspicious osseous abnormalities. Review of the MIP images confirms the above findings. IMPRESSION: 1. No pulmonary embolus. 2. Subtle upper lobe ground-glass opacities may be  infectious or inflammatory. Dependent atelectasis in both lung bases. Aortic Atherosclerosis (ICD10-I70.0). Electronically Signed   By: Jeb Levering M.D.   On: 08/31/2017 01:07     EKG: Independently reviewed.  Atrial fibrillation with RVR, QTC 452, RAD, ST depression in inferior leads and precordial leads.  Assessment/Plan Principal Problem:   Atrial fibrillation with RVR (H[facility]) Active Problems:   Hypothyroidism   Hyperlipidemia   GERD (gastroesophageal reflux disease)   Nausea & vomiting   Chronic diastolic (congestive) heart failure ([place])   New onset atrial fibrillation (H[facility])   Chest pain   Leukocytosis   Atypical pneumonia   Sepsis ([place])   New onset atrial fibrillation with RVR ([place]): with HR up to 150s, improved with IV cardizem. Patient's Synthroid dose was recently adjusted from 80-75 micrograms daily, but TSH is still not at goal 4 weeks later, which could be one triggering factor. Pt also has left-sided pleuritic CP, will need to rule out PE. CHA2DS2-VASc Score is 3, will needs long term anticoagulation.   -will admit to [facility] as inpt. -continue cardizem gtt -start IV heparin -continue metoprolol. -decrease Synthroid dose from 75-50 g daily  Chest pain: pt has pleuritic chest pain. She is Estradiol intermittently. Will need to r/o PE.  - cycle CE q6 x3 and repeat EKG in the am  - prn Nitroglycerin, Morphine, and aspirin, Zocor - Risk factor stratification: will check FLP, UDS and A1C  - 2d echo - Stat D-dimer, if positive, will get CTA to r/o PE - Inpatient non-urgent card consult order was put in Epic and message to [employer] was sent out  Hypothyroidism: TSH 0.240 today, not at goal. --decrease Synthroid dose from 75-50 g daily as above -repeat TSH in 4-6 weeks.  HLD: -Zocor  GERD: -Protonix  Nausea & vomiting and mild epigastric AP: Etiology is not clear. On physical examination, patient's abdominal is soft, no acute abdomen, may be due to acid  reflux. -continue protonix -Mylanta when necessary -  check lipase -IVF -prn Phenergan  Chronic diastolic (congestive) heart failure ([place]): found by 2-D echo on 08/21/17, which showed EF 55-60% with grade 1 diastolic dysfunction. Patient does not have leg edema JVD. CHF is compensated. -check BNP  Leukocytosis: WBC 13.4. No fever. No source of infection identified. Chest x-ray negative.  -f/u Bx  Addendum:  CTA is negative for PE, but showed subtle upper lobe ground-glass opacities which may be infectious or inflammatory. Patient has leukocytosis, and admits criteria for sepsis with tachycardia, tachypnea and leukocytosis, will start antibiotics to treat possible atypical pneumonia and sepsis. -will start levaquin -sputum culture, flu pcr -f/u Bx  -will get Procalcitonin and trend lactic acid levels per sepsis protocol. -IVF: 2.5L of NS bolus in ED, followed by 75 cc/h    DVT ppx: on IV Heparin      Code Status: Full code Family Communication: Yes, patient's sister, husband, daughter, grandson  at bed side Disposition Plan:  Anticipate discharge back to previous home environment Consults called:  none Admission status:   SDU/inpation       Date of Service 08/31/2017    Ivor Costa [facility] Pager [phone]  If 7PM-7AM, please contact night-coverage [email or web address] Password [email or web address] 08/31/2017, 1:40 AM

## 2017-08-30 NOTE — Progress Notes (Signed)
Patient admitted to [phone].  Patient complained of chest pain when taking deep breaths.  Dr Blaine Hamper notified of patient arrival and of chest pain with deep inspiration.  Dr Blaine Hamper stated that he would come assess patient.

## 2017-08-30 NOTE — ED Provider Notes (Addendum)
TIME SEEN: 3:27 AM  CHIEF COMPLAINT: Vomiting, diarrhea, palpitations  HPI: Patient is a 67 year old female with history of hyperlipidemia, hypothyroidism who presents to the emergency department with vomiting and diarrhea and now palpitations.  States that she woke up from sleep and felt like she needed to have a bowel movement and states that it was loose.  Then had several episodes of nonbloody, nonbilious vomiting.  No abdominal pain or fever.  States she then felt a cramping pain around her left breast.  Has had palpitations.  Reports intermittent palpitations for the past month.  No history of arrhythmia that she is aware of.  No chest tightness, shortness of breath.  No history of CHF, CAD, CVA.  She is not on blood thinners.  ROS: See HPI Constitutional: no fever  Eyes: no drainage  ENT: no runny nose   Cardiovascular:  no chest pain  Resp: no SOB  GI: Diarrhea and vomiting GU: no dysuria Integumentary: no rash  Allergy: no hives  Musculoskeletal: no leg swelling  Neurological: no slurred speech ROS otherwise negative  PAST MEDICAL HISTORY/PAST SURGICAL HISTORY:  Past Medical History:  Diagnosis Date  . Acid reflux   . Disc herniation    neck and low back  . Follicular cystitis   . Hernia   . Hiatal hernia   . Hyperlipidemia    ELEV CHOLESTEROL  . Osteopenia 2014   T score -1.6 FRAX 13%/1%  . Schatzki's ring   . Thyroid disease    HYPOTHYROID    MEDICATIONS:  Prior to Admission medications   Medication Sig Start Date End Date Taking? Authorizing Provider  aspirin 81 MG tablet Take 81 mg by mouth daily.      [provider]  CALCIUM PO Take by mouth.    [provider]  Cholecalciferol (VITAMIN D) 2000 UNITS tablet Take 2,000 Units by mouth daily.      [provider]  estradiol (ESTRACE) 0.1 MG/GM vaginal cream Place 2.70 Applicatorfuls vaginally as needed. For vaginal irritation 06/05/14   Fontaine, Belinda Block, MD  levothyroxine  (SYNTHROID, LEVOTHROID) 75 MCG tablet Take 1 tablet (75 mcg total) by mouth daily before breakfast. 08/02/17   Domenic Moras, PA-C  meloxicam (MOBIC) 15 MG tablet Take 1 tablet (15 mg total) by mouth daily as needed for pain. 07/25/17   [name], MD  metoprolol succinate (TOPROL-XL) 25 MG 24 hr tablet Take 1 tablet (25 mg total) by mouth daily. 07/30/17   Trixie Dredge, PA-C  Multiple Vitamin (MULTIVITAMIN) tablet Take 1 tablet by mouth daily.      [provider]  omeprazole (PRILOSEC) 40 MG capsule TAKE 1 CAPSULE (40 MG TOTAL) BY MOUTH DAILY. 09/06/15   [name], MD  simvastatin (ZOCOR) 40 MG tablet TAKE ONE TABLET BY MOUTH ONCE DAILY AT 6 PM 03/12/17   [name], MD    ALLERGIES:  Allergies  Allergen Reactions  . Amoxicillin-Pot Clavulanate     REACTION: blisters in colon  . Augmentin [Amoxicillin-Pot Clavulanate]     Colon blisters  . Ceftriaxone Sodium     REACTION: throat swells (ROCEPHIN)  . Codeine     REACTION: nausea  . Fluconazole In Dextrose     ? INTERACTION WITH SIMVASTATIN  . Molds & Smuts   . Zofran [Ondansetron Hcl]     Headaches    SOCIAL HISTORY:  Social History   Tobacco Use  . Smoking status: Former Smoker    Types: Cigarettes  Last attempt to quit: 01/27/1979    Years since quitting: 38.6  . Smokeless tobacco: Never Used  Substance Use Topics  . Alcohol use: Yes    Comment: Rare    FAMILY HISTORY: Family History  Problem Relation Age of Onset  . Cancer Brother 75       melanoma  . Stroke Sister   . Hypertension Sister   . Arthritis Sister   . Lupus Sister   . Cancer Sister   . Breast cancer Sister        Age 96  . Hyperlipidemia Brother        deceased  . Hypertension Brother   . Arthritis Daughter        Rheumatoid  . Hyperparathyroidism Daughter   . Heart attack Father 75  . Heart disease Father   . Anuerysm Father     EXAM: BP (!) 156/117 (BP Location: Right Arm)   Pulse (!)  124   Temp 98.3 F (36.8 C) (Oral)   Resp 20   LMP 09/08/2000   SpO2 100%  CONSTITUTIONAL: Alert and oriented and responds appropriately to questions. Well-appearing; well-nourished HEAD: Normocephalic EYES: Conjunctivae clear, pupils appear equal, EOMI ENT: normal nose; moist mucous membranes NECK: Supple, no meningismus, no nuchal rigidity, no LAD  CARD: Irregularly irregular and tachycardic; S1 and S2 appreciated; no murmurs, no clicks, no rubs, no gallops RESP: Normal chest excursion without splinting or tachypnea; breath sounds clear and equal bilaterally; no wheezes, no rhonchi, no rales, no hypoxia or respiratory distress, speaking full sentences ABD/GI: Normal bowel sounds; non-distended; soft, non-tender, no rebound, no guarding, no peritoneal signs, no hepatosplenomegaly BACK:  The back appears normal and is non-tender to palpation, there is no CVA tenderness EXT: Normal ROM in all joints; non-tender to palpation; no edema; normal capillary refill; no cyanosis, no calf tenderness or swelling    SKIN: Normal color for age and race; warm; no rash NEURO: Moves all extremities equally PSYCH: The patient's mood and manner are appropriate. Grooming and personal hygiene are appropriate.  MEDICAL DECISION MAKING: Patient here with nausea, vomiting or diarrhea.  May be the beginning of viral gastroenteritis.  Abdominal exam completely benign.  States she then developed palpitations and is in A. fib with RVR at this time.  Describes a cramping in her left chest but no shortness of breath.  No hemodynamic instability.  She is not on blood thinners at this time and given she has had intermittent palpitations over the past month and was seen here 1 month ago for similar symptoms, I do not feel she is a candidate for cardioversion.  I feel risk of stroke is high as she may be intermittently in atrial fibrillation.  Her chads2 VASC score is 2.  Will obtain labs, urine, chest x-ray.  Will give IV  hydration and IV diltiazem.  I feel she will need admission.   ED PROGRESS: Patient's potassium is 3.5.  Will give oral supplement to get to 4.0.  Magnesium normal at 2.0.  Troponin negative.  Hemoglobin normal.  She does have a leukocytosis which again may be from viral gastroenteritis.  Chest x-ray clear.  LFTs and lipase normal.  TSH is pending.  4:41 AM Discussed patient's case with hospitalist, Dr. Alcario Drought.  I have recommended admission and patient (and family if present) agree with this plan. Admitting physician will place admission orders.   I reviewed all nursing notes, vitals, pertinent previous records, EKGs, lab and urine results, imaging (as available).  6:40 AM  Pt has been slightly hypotensive.  She is very drowsy after receiving IV Phenergan.  When she is moving around and sitting upright her blood pressure is 99 systolic but heart rate still in the 120s.  Will give 1 L IV fluid bolus and decrease her diltiazem to 5 mg/h.  She has no complaints at this time including chest pain or shortness of breath.  No abdominal pain.     EKG Interpretation  Date/Time:  Thursday August 30 2017 03:24:52 EST Ventricular Rate:  150 PR Interval:    QRS Duration: 83 QT Interval:  286 QTC Calculation: 452 R Axis:   131 Text Interpretation:  Atrial fibrillation with rapid V-rate RVH with secondary repolarization abnrm Repolarization abnormality, prob rate related Confirmed by Pryor Curia [phone]) on 08/30/2017 3:27:24 AM        CRITICAL CARE Performed by: Cyril Mourning Ward   Total critical care time: 55 minutes  Critical care time was exclusive of separately billable procedures and treating other patients.  Critical care was necessary to treat or prevent imminent or life-threatening deterioration.  Critical care was time spent personally by me on the following activities: development of treatment plan with patient and/or surrogate as well as nursing, discussions with consultants, evaluation  of patient's response to treatment, examination of patient, obtaining history from patient or surrogate, ordering and performing treatments and interventions, ordering and review of laboratory studies, ordering and review of radiographic studies, pulse oximetry and re-evaluation of patient's condition.    Ward, Delice Bison, DO 08/30/17 Kerr, Delice Bison, DO 08/30/17 [phone]

## 2017-08-30 NOTE — ED Notes (Signed)
Patient transported to X-ray 

## 2017-08-30 NOTE — ED Triage Notes (Addendum)
Pt reports awakening w/ n/v and left sided cp, she describes as cramping. Pts HR 160s w/ an AFIB rhythm. Pt denies hx of AFIB dx. Pt A+OX4, speaking in complete sentences.

## 2017-08-30 NOTE — Progress Notes (Signed)
ANTICOAGULATION CONSULT NOTE - Initial Consult  Pharmacy Consult for Heparin  Indication: atrial fibrillation  Allergies  Allergen Reactions  . Amoxicillin-Pot Clavulanate     REACTION: blisters in colon  . Augmentin [Amoxicillin-Pot Clavulanate]     Colon blisters  . Ceftriaxone Sodium     REACTION: throat swells (ROCEPHIN)  . Codeine     REACTION: nausea  . Fluconazole In Dextrose     ? INTERACTION WITH SIMVASTATIN  . Molds & Smuts   . Zofran [Ondansetron Hcl]     Headaches    Patient Measurements: Height: 5\' 5"  (165.1 cm) Weight: 170 lb 8 oz (77.3 kg) IBW/kg (Calculated) : 57 Heparin Dosing Weight: 73 kg  Vital Signs: Temp: 99.3 F (37.4 C) (03/07 1849) Temp Source: Oral (03/07 1849) BP: 111/83 (03/07 1849) Pulse Rate: 98 (03/07 1849)  Labs: Recent Labs    08/30/17 0330  HGB 15.0  HCT 42.3  PLT 194  CREATININE 0.90  TROPONINI <0.03    Estimated Creatinine Clearance: 61.5 mL/min (by C-G formula based on SCr of 0.9 mg/dL).   Medical History: Past Medical History:  Diagnosis Date  . Acid reflux   . Disc herniation    neck and low back  . Follicular cystitis   . Hernia   . Hiatal hernia   . Hyperlipidemia    ELEV CHOLESTEROL  . Osteopenia 2014   T score -1.6 FRAX 13%/1%  . Schatzki's ring   . Thyroid disease    HYPOTHYROID    Medications:   PTA meds:  review PTA medications Scheduled:  . [START ON 08/31/2017] aspirin  81 mg Oral Daily  . [START ON 08/31/2017] cholecalciferol  2,000 Units Oral Daily  . [START ON 08/31/2017] levothyroxine  50 mcg Oral QAC breakfast  . metoprolol succinate  25 mg Oral Daily  . [START ON 08/31/2017] multivitamin with minerals  1 tablet Oral Daily  . pantoprazole  40 mg Oral Daily  . simvastatin  40 mg Oral q1800    Assessment: 67 y.o female presented to [facility] ED on 08/30/16 with vomiting, diarrhea, palpitations .   Pharmacy to start IV heparin for new onset Atrial fibrillation with RVR, though MD noted that sounds like  she has had several episodes of A.Fib intermittently over past month.   MD noted the patient is not on blood thinners PTA.     Goal of Therapy:  Heparin level 0.3-0.7 units/ml Monitor platelets by anticoagulation protocol: Yes   Plan:  Heparin 4000 units IV x1 Heparin drip at 1150 units/hr 8h HL Daily HL, CBC  Thank you for allowing pharmacy to be part of this patients care team. Nicole Cella, [place] Clinical Pharmacist Pager: [phone] Phone [phone] ([phone] or [phone] ([phone]) Main Rx [phone] 08/30/2017,7:51 PM

## 2017-08-30 NOTE — Plan of Care (Signed)
67 yo F with A.Fib RVR rate in the 150s.  Sounds like she has had several episodes of A.Fib intermittently over past month.  Associated NBNB emesis tonight as well as diarrhea.  Got IVF as well as cardizem gtt and toprol xl 25mg .  Putting in for SDU.  Please call flow manager when patient arrives to unit.

## 2017-08-31 ENCOUNTER — Encounter (HOSPITAL_COMMUNITY): Payer: Self-pay | Admitting: Radiology

## 2017-08-31 ENCOUNTER — Other Ambulatory Visit (HOSPITAL_COMMUNITY): Payer: Medicare Other

## 2017-08-31 DIAGNOSIS — I5032 Chronic diastolic (congestive) heart failure: Secondary | ICD-10-CM

## 2017-08-31 DIAGNOSIS — R112 Nausea with vomiting, unspecified: Secondary | ICD-10-CM

## 2017-08-31 DIAGNOSIS — R0789 Other chest pain: Secondary | ICD-10-CM

## 2017-08-31 DIAGNOSIS — R079 Chest pain, unspecified: Secondary | ICD-10-CM

## 2017-08-31 DIAGNOSIS — A419 Sepsis, unspecified organism: Secondary | ICD-10-CM | POA: Diagnosis present

## 2017-08-31 DIAGNOSIS — J189 Pneumonia, unspecified organism: Secondary | ICD-10-CM | POA: Diagnosis present

## 2017-08-31 LAB — TROPONIN I
TROPONIN I: 0.06 ng/mL — AB (ref <0.03)
Troponin I: 0.04 ng/mL (ref <0.03)

## 2017-08-31 LAB — LIPID PANEL
Cholesterol: 113 mg/dL (ref 0–200)
HDL: 32 mg/dL — ABNORMAL LOW (ref >40)
LDL CALC: 61 mg/dL (ref 0–99)
Total CHOL/HDL Ratio: 3.5 RATIO
Triglycerides: 101 mg/dL (ref <150)
VLDL: 20 mg/dL (ref 0–40)

## 2017-08-31 LAB — RAPID URINE DRUG SCREEN, HOSP PERFORMED
AMPHETAMINES: NOT DETECTED
BENZODIAZEPINES: NOT DETECTED
Barbiturates: NOT DETECTED
COCAINE: NOT DETECTED
OPIATES: POSITIVE — AB
Tetrahydrocannabinol: NOT DETECTED

## 2017-08-31 LAB — STREP PNEUMONIAE URINARY ANTIGEN: Strep Pneumo Urinary Antigen: NEGATIVE

## 2017-08-31 LAB — INFLUENZA PANEL BY PCR (TYPE A & B)
INFLBPCR: NEGATIVE
Influenza A By PCR: NEGATIVE

## 2017-08-31 LAB — MRSA PCR SCREENING: MRSA BY PCR: NEGATIVE

## 2017-08-31 LAB — HEPARIN LEVEL (UNFRACTIONATED): Heparin Unfractionated: 0.59 IU/mL (ref 0.30–0.70)

## 2017-08-31 MED ORDER — METOPROLOL SUCCINATE ER 50 MG PO TB24: 50.0000 mg | ORAL_TABLET | Freq: Every day | ORAL | Status: DC

## 2017-08-31 MED ORDER — APIXABAN 5 MG PO TABS: 5.0000 mg | ORAL_TABLET | Freq: Two times a day (BID) | ORAL | 0 refills | Status: DC

## 2017-08-31 MED ORDER — METOPROLOL SUCCINATE ER 50 MG PO TB24: 50.0000 mg | ORAL_TABLET | Freq: Every day | ORAL | 0 refills | Status: DC

## 2017-08-31 MED ORDER — METOPROLOL SUCCINATE ER 25 MG PO TB24
25.0000 mg | ORAL_TABLET | Freq: Once | ORAL | Status: AC
  Administered 2017-08-31: 25 mg via ORAL
  Filled 2017-08-31: qty 1

## 2017-08-31 MED ORDER — MELOXICAM 15 MG PO TABS: 15.0000 mg | ORAL_TABLET | Freq: Every day | ORAL | 1 refills | Status: DC | PRN

## 2017-08-31 MED ORDER — LEVOFLOXACIN IN D5W 750 MG/150ML IV SOLN
750.0000 mg | INTRAVENOUS | Status: DC
  Administered 2017-08-31: 750 mg via INTRAVENOUS
  Filled 2017-08-31: qty 150

## 2017-08-31 MED ORDER — APIXABAN 5 MG PO TABS
5.0000 mg | ORAL_TABLET | Freq: Two times a day (BID) | ORAL | Status: DC
  Administered 2017-08-31: 5 mg via ORAL
  Filled 2017-08-31: qty 1

## 2017-08-31 MED ORDER — SODIUM CHLORIDE 0.9 % IV BOLUS (SEPSIS)
1000.0000 mL | Freq: Once | INTRAVENOUS | Status: AC
  Administered 2017-08-31: 1000 mL via INTRAVENOUS

## 2017-08-31 NOTE — Progress Notes (Addendum)
[place] for Heparin > Apixaban Indication: atrial fibrillation  Allergies  Allergen Reactions  . Amoxicillin-Pot Clavulanate     REACTION: blisters in colon  . Augmentin [Amoxicillin-Pot Clavulanate]     Colon blisters  . Ceftriaxone Sodium     REACTION: throat swells (ROCEPHIN)  . Codeine     REACTION: nausea  . Fluconazole In Dextrose     ? INTERACTION WITH SIMVASTATIN  . Molds & Smuts   . Zofran [Ondansetron Hcl]     Headaches    Patient Measurements: Height: 5\' 5"  (165.1 cm) Weight: 175 lb 8 oz (79.6 kg) IBW/kg (Calculated) : 57 Heparin Dosing Weight: 73 kg  Vital Signs: Temp: 98 F (36.7 C) (03/08 0755) Temp Source: Oral (03/08 0755) BP: 106/59 (03/08 0530) Pulse Rate: 84 (03/08 0530)  Labs: Recent Labs    08/30/17 0330 08/30/17 2042 08/31/17 0233  HGB 15.0  --   --   HCT 42.3  --   --   PLT 194  --   --   HEPARINUNFRC  --   --  0.59  CREATININE 0.90  --   --   TROPONINI <0.03 0.07* 0.06*    Estimated Creatinine Clearance: 62.3 mL/min (by C-G formula based on SCr of 0.9 mg/dL).   Medical History: Past Medical History:  Diagnosis Date  . Acid reflux   . Arthritis    "joints, hands" (08/30/2017)  . Atrial fibrillation with RVR ([place]) 08/30/2017  . Chronic lower back pain   . Disc herniation    neck and low back  . Family history of adverse reaction to anesthesia    "daughter w/PONV"  . Follicular cystitis   . Hiatal hernia   . High cholesterol   . Hyperlipidemia    ELEV CHOLESTEROL  . Hypothyroidism   . Osteopenia 2014   T score -1.6 FRAX 13%/1%  . PONV (postoperative nausea and vomiting)   . Schatzki's ring     Medications:   PTA meds:  review PTA medications Scheduled:  . aspirin  81 mg Oral Daily  . cholecalciferol  2,000 Units Oral Daily  . levothyroxine  50 mcg Oral QAC breakfast  . metoprolol succinate  25 mg Oral Daily  . multivitamin with minerals  1 tablet Oral Daily  . pantoprazole  40 mg  Oral Daily  . simvastatin  40 mg Oral q1800    Assessment: 67 y.o female presented to [facility] ED on 08/30/16 with vomiting, diarrhea, palpitations .  Pharmacy to start IV heparin for new onset Atrial fibrillation with RVR, though MD noted that sounds like she has had several episodes of A.Fib intermittently over past month.  MD noted the patient is not on blood thinners PTA. D-dimer elevated, CTA negative for PE.  Update: Pharmacy now consulted to transition to apixaban for afib. CBC and SCr wnl. No bleed reported.   Goal of Therapy:  Stroke prevention Monitor platelets by anticoagulation protocol: Yes   Plan:  D/c heparin drip at time of 1st dose of apixaban 5mg  PO BID - communicated plan with RN Monitor CBC, s/sx bleeding  Elicia Lamp, PharmD, BCPS Clinical Pharmacist Clinical phone for 08/31/2017 until 3:30pm: [phone] If after 3:30pm, please call main pharmacy at: [phone] 08/31/2017 8:27 AM

## 2017-08-31 NOTE — Care Management Note (Addendum)
Case Management Note  Patient Details  Name: KATERIA CUTRONA MRN: [id number] Date of Birth: 05/01/1951  Subjective/Objective:  Pt presented for Atrial Fib- Benefits Check completed for Eliquis. CM will provide pt with 30 day free Eliquis Card. No further needs from CM at this time.                 Action/Plan: S/W  ASHLEY @ [place] [facility] # [phone] OPT- 2    1. ELIQUIS 2.5 MG BID  COVER- YES  CO-PAY- $ 372.37  TIER- 3 DRUG  PRIOR APPROVAL- NO    2. ELIQUIS 5 MG BID  COVER- YES  CO-PAY- $ 372.37  TIER- 3 DRUG  PRIOR APPROVAL- NO   DEDUCTIBLE: NOT MET / WHEN MET THE CO-PAY-$ 47.00   M/0 FOR 90 DAYS SUPPLY $ 141.41  PREFERRED PHARMACY : [facility]   Expected Discharge Date:                  Expected Discharge Plan:  Home/Self Care  In-House Referral:  NA  Discharge planning Services  CM Consult, Medication Assistance  Post Acute Care Choice:  NA Choice offered to:  NA  DME Arranged:  N/A DME Agency:  NA  HH Arranged:  NA HH Agency:  NA  Status of Service:  Completed, signed off  If discussed at Long Length of Stay Meetings, dates discussed:    Additional Comments:  Bethena Roys, RN 08/31/2017, 11:19 AM

## 2017-08-31 NOTE — Progress Notes (Signed)
Pt converted to NSR. ECG uploaded int to chart. Dr. Silas [place] notified. Instructed to leave Diltiazem gtt at 5 mg/min until morning. No complanits of cp at this time. CT results pending. Will continue to monitor.

## 2017-08-31 NOTE — Progress Notes (Signed)
Pt bp after repositioning. MD aware of pt condition and VS via phone call.

## 2017-08-31 NOTE — Progress Notes (Signed)
Pharmacy Antibiotic Note  Anne [name] is a 67 y.o. female admitted on 08/30/2017 with pneumonia.  Pharmacy has been consulted for Levaquin dosing.  Plan: Levaquin 750mg  IV Q24H.  Height: 5\' 5"  (165.1 cm) Weight: 170 lb 8 oz (77.3 kg) IBW/kg (Calculated) : 57  Temp (24hrs), Avg:98.8 F (37.1 C), Min:98.3 F (36.8 C), Max:99.3 F (37.4 C)  Recent Labs  Lab 08/30/17 0330 08/30/17 2041 08/30/17 2255  WBC 13.4*  --   --   CREATININE 0.90  --   --   LATICACIDVEN  --  2.3* 1.3    Estimated Creatinine Clearance: 61.5 mL/min (by C-G formula based on SCr of 0.9 mg/dL).    Allergies  Allergen Reactions  . Amoxicillin-Pot Clavulanate     REACTION: blisters in colon  . Augmentin [Amoxicillin-Pot Clavulanate]     Colon blisters  . Ceftriaxone Sodium     REACTION: throat swells (ROCEPHIN)  . Codeine     REACTION: nausea  . Fluconazole In Dextrose     ? INTERACTION WITH SIMVASTATIN  . Molds & Smuts   . Zofran [Ondansetron Hcl]     Headaches     Thank you for allowing pharmacy to be a part of this patient's care.  Wynona Neat, PharmD, BCPS  08/31/2017 1:53 AM

## 2017-08-31 NOTE — Discharge Summary (Signed)
Physician Discharge Summary  Anne [name] GYK:[id number] DOB: [birth year] DOA: 08/30/2017  PCP: [name], MD  Admit date: 08/30/2017 Discharge date: 08/31/2017  Admitted From: home Disposition:  home   Recommendations for Outpatient Follow-up:  1. F/u on TSH- synthroid dose has been adjusted today 2. F/u with cardiology   Discharge Condition:  stable   CODE STATUS:  Full code   Consultations:  cardiology    Discharge Diagnoses:  Principal Problem:   New onset atrial fibrillation (HCC) Active Problems:   Nausea & vomiting   Chronic diastolic (congestive) heart failure (HCC)   Hypothyroidism   Hyperlipidemia   GERD (gastroesophageal reflux disease)    Subjective: She has no complaints today. Her nausea/ vomiting has resolved. She has not had any further chest pain and has not felt any palpitations.   Brief Summary:  Anne [name] is a 68 y.o.femalewith medical history significant ofdCHF (by 2D echo),hyperlipidemia, GERD, hypothyroidism, who presents with chest pain. She admits to at least 4 wks of on and off palpitations and her dose of Synthroid was recently cut back because of this. She was also switched from BID Metoprolol to extended release and underwent an ECHO. She presents for chest pain, nausea, vomiting, mild epigastric discomfort and loose stools. She states she vomiting 3 times after eating dinner last night and then had a normal stool followed by a loose stool. Not too long afterwards, she noted a cramping pain in her left chest which was severe enough to bring her to the hospital.  EKG in the ER reveals A-fib with RVR @150  bpm, lactic acid 2.3, TSH 1.240, WBC 13.4. CTA chest: Subtle upper lobe ground-glass opacities may be infectious or inflammatory  Hospital Course:  A-fib with RVR- chest pain- mildly elevated troponin - she was started on a Cardizem infusion and then converted to NSR over the night- she remains in NSR - troponin elevation  has been mild at 0.07, 0.06, 0.04 - chest pain has resolved and not recurred - recent outpt ECHO on 2/26:   - Left ventricle: EF55% to 60%-  abnormal left ventricular relaxation (grade 1 diastolic   dysfunction). - Cardiology has evaluated her today -they recommends increasing Metoprolol from 25 to 50 mg daily and started Eliquis- they are further considering an outpt Myoview stress test  Hypothyroid - TSH 0.240- cardiology has decreased her Synthroid to 50 mcg daily- recommend f/u on TSH as outpt by PCP  Vomiting - the patient had 3 episodes of vomiting after dinner last night - she had 1 loose stool but nothing more- her symptoms have resolved and may have been related to what she had eaten- she has tolerated solid food in the hospital today  Cough - she has had a mild cough for at least 1 month now- CT chest revealed mild ground glass infiltrates but I do not feel she has pneumonia- she was started on Levaquin by the admitting doctor but I have discontinued this- she did have mild crackles on exam and cardiology gave a dose of IV Lasix  Today  HTN - Toprol dose adjusted as mentioned above  HLD- cont statin  Discharge Exam: Vitals:   08/31/17 0800 08/31/17 1409  BP: 101/61 125/71  Pulse: 80   Resp:    Temp:    SpO2: 95%    Vitals:   08/31/17 0530 08/31/17 0755 08/31/17 0800 08/31/17 1409  BP: (!) 106/59  101/61 125/71  Pulse: 84  80   Resp:  Temp:  98 F (36.7 C)    TempSrc:  Oral    SpO2: 95%  95%   Weight:      Height:        General: Pt is alert, awake, not in acute distress Cardiovascular: RRR, S1/S2 +, no rubs, no gallops Respiratory: CTA bilaterally, no wheezing, no rhonchi Abdominal: Soft, NT, ND, bowel sounds + Extremities: no edema, no cyanosis   Discharge Instructions  Discharge Instructions    Diet - low sodium heart healthy   Complete by:  As directed    Increase activity slowly   Complete by:  As directed      Allergies as of 08/31/2017       Reactions   Amoxicillin-pot Clavulanate    REACTION: blisters in colon   Augmentin [amoxicillin-pot Clavulanate]    Colon blisters   Ceftriaxone Sodium    REACTION: throat swells (ROCEPHIN)   Codeine    REACTION: nausea   Fluconazole In Dextrose    ? INTERACTION WITH SIMVASTATIN   Molds & Smuts    Zofran [ondansetron Hcl]    Headaches      Medication List    TAKE these medications   apixaban 5 MG Tabs tablet Commonly known as:  ELIQUIS Take 1 tablet (5 mg total) by mouth 2 (two) times daily.   aspirin 81 MG tablet Take 81 mg by mouth daily.   CALCIUM PO Take by mouth.   estradiol 0.1 MG/GM vaginal cream Commonly known as:  ESTRACE Place 0.16 Applicatorfuls vaginally as needed. For vaginal irritation   levothyroxine 75 MCG tablet Commonly known as:  SYNTHROID, LEVOTHROID Take 1 tablet (75 mcg total) by mouth daily before breakfast.   meloxicam 15 MG tablet Commonly known as:  MOBIC Take 1 tablet (15 mg total) by mouth daily as needed for pain. What changed:    when to take this  reasons to take this   metoprolol succinate 50 MG 24 hr tablet Commonly known as:  TOPROL-XL Take 1 tablet (50 mg total) by mouth daily. What changed:    medication strength  how much to take   multivitamin tablet Take 1 tablet by mouth daily.   omeprazole 40 MG capsule Commonly known as:  PRILOSEC TAKE 1 CAPSULE (40 MG TOTAL) BY MOUTH DAILY.   simvastatin 40 MG tablet Commonly known as:  ZOCOR TAKE ONE TABLET BY MOUTH ONCE DAILY AT 6 PM   Vitamin D 2000 units tablet Take 2,000 Units by mouth daily.       Allergies  Allergen Reactions  . Amoxicillin-Pot Clavulanate     REACTION: blisters in colon  . Augmentin [Amoxicillin-Pot Clavulanate]     Colon blisters  . Ceftriaxone Sodium     REACTION: throat swells (ROCEPHIN)  . Codeine     REACTION: nausea  . Fluconazole In Dextrose     ? INTERACTION WITH SIMVASTATIN  . Molds & Smuts   . Zofran [Ondansetron Hcl]      Headaches     Procedures/Studies:    Dg Chest 2 View  Result Date: 08/30/2017 CLINICAL DATA:  Atrial fibrillation.  Awoke with left breast pain. EXAM: CHEST - 2 VIEW COMPARISON:  Radiograph 07/05/2017 FINDINGS: The cardiomediastinal contours are normal. The lungs are clear. Pulmonary vasculature is normal. No consolidation, pleural effusion, or pneumothorax. No acute osseous abnormalities are seen. EKG leads overlie the chest. IMPRESSION: No acute pulmonary process. Electronically Signed   By: Jeb Levering M.D.   On: 08/30/2017 04:14   Ct  Angio Chest Pe W Or Wo Contrast  Result Date: 08/31/2017 CLINICAL DATA:  Left-sided chest pain.  Elevated D-dimer. EXAM: CT ANGIOGRAPHY CHEST WITH CONTRAST TECHNIQUE: Multidetector CT imaging of the chest was performed using the standard protocol during bolus administration of intravenous contrast. Multiplanar CT image reconstructions and MIPs were obtained to evaluate the vascular anatomy. CONTRAST:  114mL ISOVUE-370 IOPAMIDOL (ISOVUE-370) INJECTION 76% COMPARISON:  Radiograph earlier this day. FINDINGS: Cardiovascular: There are no filling defects within the pulmonary arteries to suggest pulmonary embolus. Cannot assess for dissection given phase of contrast. Mild aortic calcifications. No aortic aneurysm. Heart size is normal. No pericardial fluid. Mediastinum/Nodes: Prominent right hilar and infrahilar nodes measure less than 1 cm short axis. The esophagus is decompressed. Post thyroidectomy. Lungs/Pleura: Breathing motion artifact. Dependent atelectasis in both lower lobes. Subtle ground-glass opacities in the upper lobes. No confluent consolidation. No evidence pulmonary edema. No pleural fluid. Upper Abdomen: Postcholecystectomy.  No acute finding. Musculoskeletal: There are no acute or suspicious osseous abnormalities. Review of the MIP images confirms the above findings. IMPRESSION: 1. No pulmonary embolus. 2. Subtle upper lobe ground-glass opacities may  be infectious or inflammatory. Dependent atelectasis in both lung bases. Aortic Atherosclerosis (ICD10-I70.0). Electronically Signed   By: Jeb Levering M.D.   On: 08/31/2017 01:07     The results of significant diagnostics from this hospitalization (including imaging, microbiology, ancillary and laboratory) are listed below for reference.     Microbiology: Recent Results (from the past 240 hour(s))  MRSA PCR Screening     Status: None   Collection Time: 08/31/17  3:40 AM  Result Value Ref Range Status   MRSA by PCR NEGATIVE NEGATIVE Final    Comment:        The GeneXpert MRSA Assay (FDA approved for NASAL specimens only), is one component of a comprehensive MRSA colonization surveillance program. It is not intended to diagnose MRSA infection nor to guide or monitor treatment for MRSA infections. Performed at [facility], [place] [address], [place], [place] [address]   Culture, sputum-assessment     Status: None (Preliminary result)   Collection Time: 08/31/17  5:25 AM  Result Value Ref Range Status   Specimen Description EXPECTORATED SPUTUM  Final   Special Requests NONE  Final   Sputum evaluation   Final    THIS SPECIMEN IS ACCEPTABLE FOR SPUTUM CULTURE Performed at [facility], [place] [address] [place], [place] [address]    Report Status PENDING  Incomplete  Culture, respiratory (NON-Expectorated)     Status: None (Preliminary result)   Collection Time: 08/31/17  5:25 AM  Result Value Ref Range Status   Specimen Description EXPECTORATED SPUTUM  Final   Special Requests NONE Reflexed from [id number]  Final   Gram Stain   Final    NO WBC SEEN RARE SQUAMOUS EPITHELIAL CELLS PRESENT ABUNDANT GRAM POSITIVE COCCI ABUNDANT GRAM POSITIVE RODS RARE GRAM NEGATIVE RODS Performed at [facility], [place] [address], [place], [place] [address]    Culture PENDING  Incomplete   Report Status PENDING  Incomplete     Labs: BNP (last 3 results) Recent Labs     08/30/17 2042  BNP 371.0*   Basic Metabolic Panel: Recent Labs  Lab 08/30/17 0330  NA 136  K 3.5  CL 104  CO2 19*  GLUCOSE 173*  BUN 10  CREATININE 0.90  CALCIUM 9.2  MG 2.0   Liver Function Tests: Recent Labs  Lab 08/30/17 0330  AST 30  ALT 19  ALKPHOS 81  BILITOT 0.6  PROT 7.6  ALBUMIN 4.2   Recent Labs  Lab 08/30/17 0330 08/30/17 2042  LIPASE 36 26   No results for input(s): AMMONIA in the last 168 hours. CBC: Recent Labs  Lab 08/30/17 0330  WBC 13.4*  HGB 15.0  HCT 42.3  MCV 81.2  PLT 194   Cardiac Enzymes: Recent Labs  Lab 08/30/17 0330 08/30/17 2042 08/31/17 0233 08/31/17 1024  TROPONINI <0.03 0.07* 0.06* 0.04*   BNP: Invalid input(s): POCBNP CBG: Recent Labs  Lab 08/30/17 2130  GLUCAP 108*   D-Dimer Recent Labs    08/30/17 2042  DDIMER 0.55*   Hgb A1c No results for input(s): HGBA1C in the last 72 hours. Lipid Profile Recent Labs    08/31/17 0233  CHOL 113  HDL 32*  LDLCALC 61  TRIG 101  CHOLHDL 3.5   Thyroid function studies Recent Labs    08/30/17 0404  TSH 0.240*   Anemia work up No results for input(s): VITAMINB12, FOLATE, FERRITIN, TIBC, IRON, RETICCTPCT in the last 72 hours. Urinalysis    Component Value Date/Time   COLORURINE YELLOW 08/30/2017 0549   APPEARANCEUR CLEAR 08/30/2017 0549   LABSPEC <1.005 (L) 08/30/2017 0549   PHURINE 6.0 08/30/2017 0549   GLUCOSEU NEGATIVE 08/30/2017 0549   HGBUR NEGATIVE 08/30/2017 0549   HGBUR negative 02/25/2009 1537   BILIRUBINUR NEGATIVE 08/30/2017 0549   BILIRUBINUR neg 07/14/2011 1022   KETONESUR NEGATIVE 08/30/2017 0549   PROTEINUR NEGATIVE 08/30/2017 0549   UROBILINOGEN 0.2 06/05/2014 0917   NITRITE NEGATIVE 08/30/2017 0549   LEUKOCYTESUR NEGATIVE 08/30/2017 0549   Sepsis Labs Invalid input(s): PROCALCITONIN,  WBC,  LACTICIDVEN Microbiology Recent Results (from the past 240 hour(s))  MRSA PCR Screening     Status: None   Collection Time: 08/31/17  3:40  AM  Result Value Ref Range Status   MRSA by PCR NEGATIVE NEGATIVE Final    Comment:        The GeneXpert MRSA Assay (FDA approved for NASAL specimens only), is one component of a comprehensive MRSA colonization surveillance program. It is not intended to diagnose MRSA infection nor to guide or monitor treatment for MRSA infections. Performed at [facility], [place] [address] [place], [place] [address]   Culture, sputum-assessment     Status: None (Preliminary result)   Collection Time: 08/31/17  5:25 AM  Result Value Ref Range Status   Specimen Description EXPECTORATED SPUTUM  Final   Special Requests NONE  Final   Sputum evaluation   Final    THIS SPECIMEN IS ACCEPTABLE FOR SPUTUM CULTURE Performed at [facility], [place] [address] [place], [place] [address]    Report Status PENDING  Incomplete  Culture, respiratory (NON-Expectorated)     Status: None (Preliminary result)   Collection Time: 08/31/17  5:25 AM  Result Value Ref Range Status   Specimen Description EXPECTORATED SPUTUM  Final   Special Requests NONE Reflexed from [id number]  Final   Gram Stain   Final    NO WBC SEEN RARE SQUAMOUS EPITHELIAL CELLS PRESENT ABUNDANT GRAM POSITIVE COCCI ABUNDANT GRAM POSITIVE RODS RARE GRAM NEGATIVE RODS Performed at [facility], [place] [address], [place], [place] [address]    Culture PENDING  Incomplete   Report Status PENDING  Incomplete     Time coordinating discharge: Over 30 minutes  SIGNED:   Debbe Odea, MD  [facility] 08/31/2017, 2:59 PM Pager   If 7PM-7AM, please  contact night-coverage [email or web address] Password [email or web address]

## 2017-08-31 NOTE — Consult Note (Addendum)
Cardiology Consultation:   Patient ID: Anne [name]; [id number]; 1951-05-31   Admit date: 08/30/2017 Date of Consult: 08/31/2017  Primary Care Provider: [name], MD Primary Cardiologist: New (Dr. Acie Fredrickson)  Patient Profile:   Anne [name] is a 67 y.o. female with a hx of hypothyroidism, HTN and HLD  who is being seen today for the evaluation of new onset atrial fibrillation and chest pain at the request of Dr. Blaine Hamper , Internal Medicine.   History of Present Illness:   Anne [name] has no prior cardiac history. Cardiac risk factors include HTN and HLD. She also notes family h/o CAD. Her father had an MI in his early 31s. Additional PMH includes thyroidectomy over 40 years ago, hypothyroidism on Levothryroxine, hiatal hernia/GERD. She was recently seen by her PCP on 08/13/17 for palpitations. She has been on metoprolol which was helping, however recently she has had more worsening symptoms. Both her PCP and endocrinologist felt that her palpitations were most likely secondary to overmedication on her thyroid medication. Her Levothyroxine was subsequently decreased from 88 mcg to 75 mcg daily. No EKG was obtained at that visit. Pt however did get an echocardiogram 08/21/17 which showed normal LVEF at 55-60% w/ G1DD. No WMAs. LA was normal size. Mild pulmonic valve regurgitation and trivial tricuspid regurgitation was noted. Also of note, pt also recently treated for a sinus infection 6 weeks ago and was given a Zpack.    Pt presented to the [facility] ED night of 08/30/17 with complaint of left sided chest pain, felt like a cramp under her left breast. She tried checking her BP and HR at home. HR was very high, prompting her to go to the ED. Pt also notes that 1-2 days prior to onset of chest pain she had mild diarrhea, nausea and vomiting. On arrival, she was found to be in atrial fibrillation w/ RVR w/ rates in the 150s. TSH low at 0.240. Mg 2.0. K 3.5. SCr 0.90. BUN 10. UA negative. Influenza panel  negative. D-dimer was elevated at 0.5, however chest CT negative for PE. Aortic atherosclerosis noted. CXR negative. Troponin mildly elevated but with flat trend, 0.07>>0.06. BNP abnormal at 341. Lipids are controlled. LDL 61 mg/dL. TG 101. However HDL is low at 32. Total cholesterol is 113. Pt admitted by IM and started on IV heparin and IV dilt. Dose of Levothyroxine has been further decreased from 75-50 mcg.   Pt had spontaneous conversion to NSR w/ IV Cardizem. Her CP resolved once back in NSR. Her HR is in the 80s. Primary team has stopped heparin and added Eliquis for anticoagulation. She remains on Cardizem drip at 5 mg/hr. She is also on long acting metoprolol 25 mg daily.      Past Medical History:  Diagnosis Date  . Acid reflux   . Arthritis    "joints, hands" (08/30/2017)  . Atrial fibrillation with RVR ([place]) 08/30/2017  . Chronic lower back pain   . Disc herniation    neck and low back  . Family history of adverse reaction to anesthesia    "daughter w/PONV"  . Follicular cystitis   . Hiatal hernia   . High cholesterol   . Hyperlipidemia    ELEV CHOLESTEROL  . Hypothyroidism   . Osteopenia 2014   T score -1.6 FRAX 13%/1%  . PONV (postoperative nausea and vomiting)   . Schatzki's ring     Past Surgical History:  Procedure Laterality Date  . BREAST BIOPSY Bilateral 1997   "  excisional bx both benign"  . BREAST CYST ASPIRATION Right ~ 1993  . CATARACT EXTRACTION W/ INTRAOCULAR LENS IMPLANT Left 2011  . CATARACT EXTRACTION W/ INTRAOCULAR LENS IMPLANT Right 12/20/2012   Dr. Haynes Bast, OD  . COLONOSCOPY WITH ESOPHAGOGASTRODUODENOSCOPY (EGD)  "several"   "? dilated esophagus" (08/30/2017)  . DILATION AND CURETTAGE OF UTERUS  1996  . EYE MUSCLE SURGERY Bilateral 1992  . HERNIA REPAIR    . HYSTEROSCOPY    . LAPAROSCOPIC CHOLECYSTECTOMY  09/28/2011  . OOPHORECTOMY Right 04/2000   LAP RSO/LYSIS OF ADHESIONS  . THYROIDECTOMY  1976  . TUBAL LIGATION  1976  . VARICOSE  VEIN SURGERY Bilateral 1972  . VENTRAL HERNIA REPAIR  06/23/13   Dr. Franz Dell      Home Medications:  Prior to Admission medications   Medication Sig Start Date End Date Taking? Authorizing Provider  aspirin 81 MG tablet Take 81 mg by mouth daily.      [provider]  CALCIUM PO Take by mouth.    [provider]  Cholecalciferol (VITAMIN D) 2000 UNITS tablet Take 2,000 Units by mouth daily.      [provider]  estradiol (ESTRACE) 0.1 MG/GM vaginal cream Place 2.02 Applicatorfuls vaginally as needed. For vaginal irritation 06/05/14   Fontaine, Belinda Block, MD  levothyroxine (SYNTHROID, LEVOTHROID) 75 MCG tablet Take 1 tablet (75 mcg total) by mouth daily before breakfast. 08/02/17   Domenic Moras, PA-C  meloxicam (MOBIC) 15 MG tablet Take 1 tablet (15 mg total) by mouth daily as needed for pain. 07/25/17   [name], MD  metoprolol succinate (TOPROL-XL) 25 MG 24 hr tablet Take 1 tablet (25 mg total) by mouth daily. 07/30/17   Trixie Dredge, PA-C  Multiple Vitamin (MULTIVITAMIN) tablet Take 1 tablet by mouth daily.      [provider]  omeprazole (PRILOSEC) 40 MG capsule TAKE 1 CAPSULE (40 MG TOTAL) BY MOUTH DAILY. 09/06/15   [name], MD  simvastatin (ZOCOR) 40 MG tablet TAKE ONE TABLET BY MOUTH ONCE DAILY AT 6 PM 03/12/17   [name], MD    Inpatient Medications: Scheduled Meds: . aspirin  81 mg Oral Daily  . cholecalciferol  2,000 Units Oral Daily  . levothyroxine  50 mcg Oral QAC breakfast  . metoprolol succinate  25 mg Oral Daily  . multivitamin with minerals  1 tablet Oral Daily  . pantoprazole  40 mg Oral Daily  . simvastatin  40 mg Oral q1800   Continuous Infusions: . sodium chloride Stopped (08/31/17 0230)  . diltiazem (CARDIZEM) infusion 5 mg/hr (08/31/17 0117)  . heparin 1,150 Units/hr (08/30/17 2045)  . levofloxacin (LEVAQUIN) IV Stopped (08/31/17 0413)   PRN Meds: acetaminophen,  ALPRAZolam, alum & mag hydroxide-simeth, loperamide, morphine injection, nitroGLYCERIN, promethazine, zolpidem  Allergies:    Allergies  Allergen Reactions  . Amoxicillin-Pot Clavulanate     REACTION: blisters in colon  . Augmentin [Amoxicillin-Pot Clavulanate]     Colon blisters  . Ceftriaxone Sodium     REACTION: throat swells (ROCEPHIN)  . Codeine     REACTION: nausea  . Fluconazole In Dextrose     ? INTERACTION WITH SIMVASTATIN  . Molds & Smuts   . Zofran [Ondansetron Hcl]     Headaches    Social History:   Social History   Socioeconomic History  . Marital status: Married    Spouse name: [name]  . Number of children: 1  . Years of education: Not on  file  . Highest education level: Not on file  Social Needs  . Financial resource strain: Not on file  . Food insecurity - worry: Not on file  . Food insecurity - inability: Not on file  . Transportation needs - medical: Not on file  . Transportation needs - non-medical: Not on file  Occupational History  . Not on file  Tobacco Use  . Smoking status: Former Smoker    Packs/day: 0.50    Years: 18.00    Pack years: 9.00    Types: Cigarettes    Last attempt to quit: 01/27/1979    Years since quitting: 38.6  . Smokeless tobacco: Never Used  Substance and Sexual Activity  . Alcohol use: Yes    Comment: 08/30/2017 "might have glass of wine twice/year"  . Drug use: No  . Sexual activity: Not on file  Other Topics Concern  . Not on file  Social History Narrative   4 brothers and 6 sisters. All 4 brother deceased.  2 sisters deceased.  No regular exercise. Still working.      Family History:    Family History  Problem Relation Age of Onset  . Cancer Brother 53       melanoma  . Stroke Sister   . Hypertension Sister   . Arthritis Sister   . Lupus Sister   . Cancer Sister   . Breast cancer Sister        Age 65  . Hyperlipidemia Brother        deceased  . Hypertension Brother   . Arthritis Daughter         Rheumatoid  . Hyperparathyroidism Daughter   . Heart attack Father 76  . Heart disease Father   . Anuerysm Father      ROS:  Please see the history of present illness.   All other ROS reviewed and negative.     Physical Exam/Data:   Vitals:   08/31/17 0300 08/31/17 0434 08/31/17 0505 08/31/17 0530  BP: (!) 100/57 90/60 (!) 103/59 (!) 106/59  Pulse: 80 82 83 84  Resp:  18    Temp:  98.5 F (36.9 C)    TempSrc:  Oral    SpO2: 95% 96% 94% 95%  Weight:  175 lb 8 oz (79.6 kg)    Height:        Intake/Output Summary (Last 24 hours) at 08/31/2017 0755 Last data filed at 08/31/2017 0600 Gross per 24 hour  Intake 1000 ml  Output 1400 ml  Net -400 ml   Filed Weights   08/30/17 1849 08/31/17 0434  Weight: 170 lb 8 oz (77.3 kg) 175 lb 8 oz (79.6 kg)   Body mass index is 29.2 kg/m.  General:  Well nourished, well developed, in no acute distress HEENT: normal Lymph: no adenopathy Neck: no JVD Endocrine:  No thryomegaly Vascular: No carotid bruits; FA pulses 2+ bilaterally without bruits  Cardiac:  normal S1, S2; RRR; no murmur  Lungs:  Bibasilar crackles at the bases Abd: soft, nontender, no hepatomegaly  Ext: no edema Musculoskeletal:  No deformities, BUE and BLE strength normal and equal Skin: warm and dry  Neuro:  CNs 2-12 intact, no focal abnormalities noted Psych:  Normal affect   EKG:  The EKG was personally reviewed and demonstrates:  Atrial fibrillation in the 150s (initial EKG), NSR on repeat EKG 08/31/17  Telemetry:  Telemetry was personally reviewed and demonstrates:  NSR   Relevant CV Studies: 2D Echo 08/21/17 Study Conclusions  -  Left ventricle: The cavity size was normal. Systolic function was   normal. The estimated ejection fraction was in the range of 55%   to 60%. Wall motion was normal; there were no regional wall   motion abnormalities. Doppler parameters are consistent with   abnormal left ventricular relaxation (grade 1 diastolic    dysfunction). - Aortic valve: Valve area (VTI): 2.57 cm^2. Valve area (Vmax):   2.43 cm^2. Valve area (Vmean): 2.75 cm^2.  Impressions:  - Normal LVEF.   Mild PR.   Trace TR.  Laboratory Data:  Chemistry Recent Labs  Lab 08/30/17 0330  NA 136  K 3.5  CL 104  CO2 19*  GLUCOSE 173*  BUN 10  CREATININE 0.90  CALCIUM 9.2  GFRNONAA >60  GFRAA >60  ANIONGAP 13    Recent Labs  Lab 08/30/17 0330  PROT 7.6  ALBUMIN 4.2  AST 30  ALT 19  ALKPHOS 81  BILITOT 0.6   Hematology Recent Labs  Lab 08/30/17 0330  WBC 13.4*  RBC 5.21*  HGB 15.0  HCT 42.3  MCV 81.2  MCH 28.8  MCHC 35.5  RDW 14.0  PLT 194   Cardiac Enzymes Recent Labs  Lab 08/30/17 0330 08/30/17 2042 08/31/17 0233  TROPONINI <0.03 0.07* 0.06*   No results for input(s): TROPIPOC in the last 168 hours.  BNP Recent Labs  Lab 08/30/17 2042  BNP 341.6*    DDimer  Recent Labs  Lab 08/30/17 2042  DDIMER 0.55*    Radiology/Studies:  Dg Chest 2 View  Result Date: 08/30/2017 CLINICAL DATA:  Atrial fibrillation.  Awoke with left breast pain. EXAM: CHEST - 2 VIEW COMPARISON:  Radiograph 07/05/2017 FINDINGS: The cardiomediastinal contours are normal. The lungs are clear. Pulmonary vasculature is normal. No consolidation, pleural effusion, or pneumothorax. No acute osseous abnormalities are seen. EKG leads overlie the chest. IMPRESSION: No acute pulmonary process. Electronically Signed   By: Jeb Levering M.D.   On: 08/30/2017 04:14   Ct Angio Chest Pe W Or Wo Contrast  Result Date: 08/31/2017 CLINICAL DATA:  Left-sided chest pain.  Elevated D-dimer. EXAM: CT ANGIOGRAPHY CHEST WITH CONTRAST TECHNIQUE: Multidetector CT imaging of the chest was performed using the standard protocol during bolus administration of intravenous contrast. Multiplanar CT image reconstructions and MIPs were obtained to evaluate the vascular anatomy. CONTRAST:  121mL ISOVUE-370 IOPAMIDOL (ISOVUE-370) INJECTION 76% COMPARISON:   Radiograph earlier this day. FINDINGS: Cardiovascular: There are no filling defects within the pulmonary arteries to suggest pulmonary embolus. Cannot assess for dissection given phase of contrast. Mild aortic calcifications. No aortic aneurysm. Heart size is normal. No pericardial fluid. Mediastinum/Nodes: Prominent right hilar and infrahilar nodes measure less than 1 cm short axis. The esophagus is decompressed. Post thyroidectomy. Lungs/Pleura: Breathing motion artifact. Dependent atelectasis in both lower lobes. Subtle ground-glass opacities in the upper lobes. No confluent consolidation. No evidence pulmonary edema. No pleural fluid. Upper Abdomen: Postcholecystectomy.  No acute finding. Musculoskeletal: There are no acute or suspicious osseous abnormalities. Review of the MIP images confirms the above findings. IMPRESSION: 1. No pulmonary embolus. 2. Subtle upper lobe ground-glass opacities may be infectious or inflammatory. Dependent atelectasis in both lung bases. Aortic Atherosclerosis (ICD10-I70.0). Electronically Signed   By: Jeb Levering M.D.   On: 08/31/2017 01:07    Assessment and Plan:   ANACRISTINA STEFFEK is a 67 y.o. female with a hx of hypothyroidism, HTN and HLD  who is being seen today for the evaluation of new onset atrial fibrillation and  chest pain at the request of Dr. Blaine Hamper , Internal Medicine.   1. New Onset Atrial Fibrillation: initial rates in the ED in the 150s. K, Mg, H/H and renal function WNL.UA and CXR negative. Chest CT negative for PE. Overmedication with thyroid replacement, is speculated to be the primary cause of her afib, as previous suspected by both her PCP and endocrinologist. She has had recent decreases in her levothyroxine. TSH in the ED was low, thus further med reduction was made. Dose now down to 50 mcg daily (previously on 88>>75 mcg). Recent outpatient echo 08/21/17 showed normal LVEF, wall motion and atrial size. No significant valvular abnormalities. Current  HR in the 80s. Can transition to 120 mg of PO Cardizem. Continue metoprolol. 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   2. Chest Pain: associated with afib w/ RVR. Symptoms resolved after conversion back to NSR and improvement in HR. Troponins are mildly elevated and with flat trend, 0.07>>0.06. Enzyme level and trend not c/w ACS. This likely represents demand ischemia from rapid afib. Recent echo fairly normal. Normal LVEF and wall motion. Prior to this admission, pt was active and able to complete house hold chores (mopping, vacuuming) w/o exertional CP and dyspnea. We can reassesses as outpatient and may consider outpatient ischemic testing. No plans for further inpatient w/u.   3. Hypothyroidism: adjustments made to Levothyroxine as outlined above. F/u with endocrinology.  4. HTN: controlled on current regimen.   5. HLD: controlled with simvastatin. Lipid panel shows LDL 61 mg/dL. TG 101. However HDL is low at 32. Total cholesterol is 113. Can try to increase activity to raise HDL.   6. Diastolic Dysfunction: X3KG noted on recent echo. Lung exam notable for mild bibasilar crackles. BNP mildly elevated at 341. Recommend low dose IV Lasix, 20 mg, x 1.   MD to follow with further recs.   For questions or updates, please contact [place] Please consult [email or web address] for contact info under Cardiology/STEMI.   Signed, Lyda Jester, PA-C  08/31/2017 7:55 AM   Attending Note:   The patient was seen and examined.  Agree with assessment and plan as noted above.  Changes made to the above note as needed.  Patient seen and independently examined with Ellen Henri, PA .   We discussed all aspects of the encounter. I agree with the assessment and plan as stated above.  1.  Chest discomfort: Patient  presented with chest discomfort that seems to be associated with rapid atrial fibrillation.  Her chest pain resolved promptly when her atrial fibrillation resolved.  She did have very minimal elevation in troponin levels up to 0.07 which is probably due to demand ischemia.  She appears to be stable now.  Her EKG is without ST or T wave changes.  We will anticipate doing a stress Myoview study as an outpatient and will see her back in the office in several weeks.  2.  Atrial fibrillation: The patient presented with rapid atrial fibrillation.  CHADS2VASC is 2 ( female, age > 4) .   In the setting of hyperthyroidism caused by excessive Synthroid doses, I would initiate Eliquis at 5 mg twice a day.   It is not clear if she would have had atrial fibrillation if you are not hyperthyroid.  We can certainly assess her in the future with  a 30-day event monitor if needed.  I think her safest option is to continue anti-coagulation for now.  Will increase toprol XL to 50 mg a day - will use this instead of Diltiazem   3.  Chronic diastolic congestive heart failure.  Ejection fraction is 55-60%.  She has grade 1 diastolic dysfunction.  There is trace pulmonic insufficiency and trace tricuspid regurgitation.  Have advised her to avoid salt .  Agree with lasix x 1 dose.   We will consider long term lasix as OP    I have spent a total of 40 minutes with patient reviewing hospital  notes , telemetry, EKGs, labs and examining patient as well as establishing an assessment and plan that was discussed with the patient. > 50% of time was spent in direct patient care.    Thayer Headings, Brooke Bonito., MD, [facility] 08/31/2017, 10:21 AM [address]  [place] Pager [phone]

## 2017-08-31 NOTE — Progress Notes (Signed)
Re-checked bp and repositioned pt

## 2017-08-31 NOTE — Progress Notes (Signed)
Patient ambulated hallway x2 without any difficulties. Denies any symptoms. Pt remains in sinus rhythm, room air. Will continue to monitor.

## 2017-08-31 NOTE — Progress Notes (Signed)
Patient received discharge instructions with daughter and husband. Verbalized understanding. PIV removed. Patient educated on new medication. No further questions. Escorted out via wheelchair by Chartered certified accountant.

## 2017-08-31 NOTE — Plan of Care (Signed)
Pt in NSR throughout night. Dr. Silas [place] aware and instructed to leave Diltiazem drip at 5 mg/hr until rounding doctors switch to PO Diltiazem in morning.No further complaints of CP. VS stable at this time. Pt met Sepsis protocol. Lactic 2.3. Fluids given per orders, Levaquin started, Influenza negative, sputum and urine cultures pending.

## 2017-09-01 LAB — HEMOGLOBIN A1C
Hgb A1c MFr Bld: 5.2 % (ref 4.8–5.6)
Mean Plasma Glucose: 103 mg/dL

## 2017-09-01 LAB — EXPECTORATED SPUTUM ASSESSMENT W GRAM STAIN, RFLX TO RESP C

## 2017-09-01 LAB — LEGIONELLA PNEUMOPHILA SEROGP 1 UR AG: L. PNEUMOPHILA SEROGP 1 UR AG: NEGATIVE

## 2017-09-01 LAB — EXPECTORATED SPUTUM ASSESSMENT W REFEX TO RESP CULTURE

## 2017-09-02 LAB — CULTURE, RESPIRATORY
CULTURE: NORMAL
GRAM STAIN: NONE SEEN

## 2017-09-02 LAB — CULTURE, RESPIRATORY W GRAM STAIN

## 2017-09-03 ENCOUNTER — Telehealth: Payer: Self-pay | Admitting: Nurse Practitioner

## 2017-09-03 DIAGNOSIS — R079 Chest pain, unspecified: Secondary | ICD-10-CM

## 2017-09-03 DIAGNOSIS — I4891 Unspecified atrial fibrillation: Secondary | ICD-10-CM

## 2017-09-03 NOTE — Telephone Encounter (Signed)
Exercise myoview order placed and request to scheduling for appointments sent

## 2017-09-03 NOTE — Telephone Encounter (Signed)
-----   Message from Thayer Headings, MD sent at 08/31/2017 10:30 AM EST ----- Jari Sportsman / Katie Good morning. Will you schedlue Ms. Barnet Pall for a stress myoivew for 2-3 weeks followed by an appt with me or APP ( [place], [place], or vin) several days later. Thanks  [employer]

## 2017-09-03 NOTE — Consult Note (Signed)
            [facility] CM Primary Care Navigator  09/03/2017  Anne [name] 07-Aug-1950 [id number]   Attempt to seepatient at the bedsideto identify possible discharge needs but she was alreadydischarged home.   PerMD note,patient was admitted for chest pain, on and off palpitations, nausea, vomiting, mild epigastric discomfort and loose stools.  Primary care provider's office called(Angela)to notify of patient's discharge andneed for post hospital follow-up and transition of care (TOC). Notified of health issues needing follow-up as well.  Made aware to refer patient to [facility] CM if deemed necessary and appropriate for services.   For additional questions please contact:  Edwena Felty A. Dacoda Spallone, BSN, RN-BC [facility] PRIMARY CARE Navigator Cell: [phone]

## 2017-09-04 LAB — CULTURE, BLOOD (ROUTINE X 2)
Culture: NO GROWTH
Culture: NO GROWTH
SPECIAL REQUESTS: ADEQUATE
Special Requests: ADEQUATE

## 2017-09-05 NOTE — Telephone Encounter (Signed)
Patient's appointments have been scheduled and patient notified by [facility]

## 2017-09-06 ENCOUNTER — Ambulatory Visit (INDEPENDENT_AMBULATORY_CARE_PROVIDER_SITE_OTHER): Payer: Medicare Other | Admitting: Family Medicine

## 2017-09-06 ENCOUNTER — Encounter: Payer: Self-pay | Admitting: Family Medicine

## 2017-09-06 VITALS — BP 120/64 | HR 79 | Ht 65.0 in | Wt 168.0 lb

## 2017-09-06 DIAGNOSIS — E890 Postprocedural hypothyroidism: Secondary | ICD-10-CM | POA: Diagnosis not present

## 2017-09-06 DIAGNOSIS — R0683 Snoring: Secondary | ICD-10-CM | POA: Diagnosis not present

## 2017-09-06 DIAGNOSIS — Z8679 Personal history of other diseases of the circulatory system: Secondary | ICD-10-CM

## 2017-09-06 DIAGNOSIS — H9311 Tinnitus, right ear: Secondary | ICD-10-CM | POA: Diagnosis not present

## 2017-09-06 DIAGNOSIS — R002 Palpitations: Secondary | ICD-10-CM

## 2017-09-06 NOTE — Progress Notes (Signed)
Subjective:    Patient ID: Anne [name], female    DOB: 1951-04-28, 67 y.o.   MRN: [id number]  HPI 67 year old female comes in today for hospital follow-up for atrial fibrillation.  She was admitted on March 7 after she bed to go to the bathroom and suddenly felt nauseated.  She tried to lay down and then felt almost like a catch in the left side of her chest just below the breast area that she could not quite get rid of and then ended up going to the emergency department.  She admitted that she been having some palpitations on and off for about 4 weeks prior.  She actually was on metoprolol to control palpitations and her Synthroid dose had recently been adjusted.  Upon admission to the hospital her TSH was 1.2.  So they actually decreased her thyroid regimen back to 75 mcg daily.  Had recently been bumped up to 88 mcg daily.  Regards to the atrial fibrillation they were able to do a Cardizem infusion and converted to normal sinus rhythm which she remained in up until discharge.  She is been doing well since she is been home just feels a little tired but has not had any more palpitations or heart racing.  They also increased her metoprolol from 25-50 mg daily and started on Eliquis which was quite expensive.  She wonders how long she will be on it.  She was then scheduled for a outpatient stress test from March 22.  In regards to the vomiting that seems to have resolved.  Of note she did have a chest CT done there as she had complained of a cough for about a month which just showed some mild groundglass infiltrates but they felt like it was not convincing for pneumonia so even though she was started on Levaquin at admission it was discontinued.   He also will be to check her right ear today.  She is noticed that there has been a loud buzzing sound in that ear that has been bothering her on and off.  She also wanted to discuss her snoring.  She says that she mostly mouth breathes at night and has  been snoring for years.  In fact several years ago she has had a sleep study in [place] which she reports was negative.  But she says at some point in the future she would actually like to see ENT about this.  Hypothyroidism-in regards to her thyroid she would like to consider seeing a new endocrinologist at some point.  Review of Systems   Of systems is negative except for HPI.  LMP 09/08/2000     Allergies  Allergen Reactions  . Amoxicillin-Pot Clavulanate     REACTION: blisters in colon  . Augmentin [Amoxicillin-Pot Clavulanate]     Colon blisters  . Ceftriaxone Sodium     REACTION: throat swells (ROCEPHIN)  . Codeine     REACTION: nausea  . Fluconazole In Dextrose     ? INTERACTION WITH SIMVASTATIN  . Molds & Smuts   . Zofran [Ondansetron Hcl]     Headaches    Past Medical History:  Diagnosis Date  . Acid reflux   . Arthritis    "joints, hands" (08/30/2017)  . Atrial fibrillation with RVR ([place]) 08/30/2017  . Chronic lower back pain   . Disc herniation    neck and low back  . Family history of adverse reaction to anesthesia    "daughter w/PONV"  . Follicular cystitis   .  Hiatal hernia   . High cholesterol   . Hyperlipidemia    ELEV CHOLESTEROL  . Hypothyroidism   . Osteopenia 2014   T score -1.6 FRAX 13%/1%  . PONV (postoperative nausea and vomiting)   . Schatzki's ring     Past Surgical History:  Procedure Laterality Date  . BREAST BIOPSY Bilateral 1997   "excisional bx both benign"  . BREAST CYST ASPIRATION Right ~ 1993  . CATARACT EXTRACTION W/ INTRAOCULAR LENS IMPLANT Left 2011  . CATARACT EXTRACTION W/ INTRAOCULAR LENS IMPLANT Right 12/20/2012   Dr. Haynes Bast, OD  . COLONOSCOPY WITH ESOPHAGOGASTRODUODENOSCOPY (EGD)  "several"   "? dilated esophagus" (08/30/2017)  . DILATION AND CURETTAGE OF UTERUS  1996  . EYE MUSCLE SURGERY Bilateral 1992  . HERNIA REPAIR    . HYSTEROSCOPY    . LAPAROSCOPIC CHOLECYSTECTOMY  09/28/2011  . OOPHORECTOMY  Right 04/2000   LAP RSO/LYSIS OF ADHESIONS  . THYROIDECTOMY  1976  . TUBAL LIGATION  1976  . VARICOSE VEIN SURGERY Bilateral 1972  . VENTRAL HERNIA REPAIR  06/23/13   Dr. Franz Dell    Social History   Socioeconomic History  . Marital status: Married    Spouse name: [name]  . Number of children: 1  . Years of education: Not on file  . Highest education level: Not on file  Social Needs  . Financial resource strain: Not on file  . Food insecurity - worry: Not on file  . Food insecurity - inability: Not on file  . Transportation needs - medical: Not on file  . Transportation needs - non-medical: Not on file  Occupational History  . Not on file  Tobacco Use  . Smoking status: Former Smoker    Packs/day: 0.50    Years: 18.00    Pack years: 9.00    Types: Cigarettes    Last attempt to quit: 01/27/1979    Years since quitting: 38.6  . Smokeless tobacco: Never Used  Substance and Sexual Activity  . Alcohol use: Yes    Comment: 08/30/2017 "might have glass of wine twice/year"  . Drug use: No  . Sexual activity: Not on file  Other Topics Concern  . Not on file  Social History Narrative   4 brothers and 6 sisters. All 4 brother deceased.  2 sisters deceased.  No regular exercise. Still working.      Family History  Problem Relation Age of Onset  . Cancer Brother 46       melanoma  . Stroke Sister   . Hypertension Sister   . Arthritis Sister   . Lupus Sister   . Cancer Sister   . Breast cancer Sister        Age 34  . Hyperlipidemia Brother        deceased  . Hypertension Brother   . Arthritis Daughter        Rheumatoid  . Hyperparathyroidism Daughter   . Heart attack Father 15  . Heart disease Father   . Anuerysm Father     Outpatient Encounter Medications as of 09/06/2017  Medication Sig  . apixaban (ELIQUIS) 5 MG TABS tablet Take 1 tablet (5 mg total) by mouth 2 (two) times daily.  [name] aspirin 81 MG tablet Take 81 mg by mouth daily.    [name] CALCIUM PO Take by  mouth.  . Cholecalciferol (VITAMIN D) 2000 UNITS tablet Take 2,000 Units by mouth daily.    [name] estradiol (ESTRACE) 0.1 MG/GM vaginal cream Place 2.54 Applicatorfuls  vaginally as needed. For vaginal irritation  . levothyroxine (SYNTHROID, LEVOTHROID) 75 MCG tablet Take 1 tablet (75 mcg total) by mouth daily before breakfast.  . meloxicam (MOBIC) 15 MG tablet Take 1 tablet (15 mg total) by mouth daily as needed for pain.  . metoprolol succinate (TOPROL-XL) 50 MG 24 hr tablet Take 1 tablet (50 mg total) by mouth daily.  . Multiple Vitamin (MULTIVITAMIN) tablet Take 1 tablet by mouth daily.    [name] omeprazole (PRILOSEC) 40 MG capsule TAKE 1 CAPSULE (40 MG TOTAL) BY MOUTH DAILY.  . simvastatin (ZOCOR) 40 MG tablet TAKE ONE TABLET BY MOUTH ONCE DAILY AT 6 PM   No facility-administered encounter medications on file as of 09/06/2017.           Objective:   Physical Exam  Constitutional: She is oriented to person, place, and time. She appears well-developed and well-nourished.  HENT:  Head: Normocephalic and atraumatic.  Right Ear: External ear normal.  Right TM and canal is clear.  Eyes: Conjunctivae and EOM are normal.  Neck: Neck supple. No thyromegaly present.  Cardiovascular: Normal rate, regular rhythm and normal heart sounds.  Pulmonary/Chest: Effort normal and breath sounds normal.  Musculoskeletal: She exhibits no edema.  Lymphadenopathy:    She has no cervical adenopathy.  Neurological: She is alert and oriented to person, place, and time. She exhibits normal muscle tone.  Skin: Skin is warm and dry.  Psychiatric: She has a normal mood and affect. Her behavior is normal. Thought content normal.          Assessment & Plan:  Atrial fibrillation-resolved after conversion.  She has not converted back into atrial fibrillation which is fantastic.  We will continue to monitor.  She has a stress test scheduled for next week and then follow-up with cardiology the week after that.   Hopefully she will just be on the Eliquis for a limited amount of time.  And hopefully the cost will come down as she is likely met her drug deductible at this point.  Chest pain-that has resolved as well but she did have a slightly elevated troponin during hospitalization so they are scheduling her for a stress test.  Palpitations-she is now on increased dose of metoprolol as well.  Right ear ringing-hearing a loud hissing noise.  Exam is completely normal.  No sign of erythema fluid or cerumen impacting the eardrum.  Hypothyroidism-she will be due to recheck TSH level around April 7.  We are happy to do that for her or if she would like to get back in with her endocrinologist and she certainly can or if she prefers consultation with new endocrinologist in the future she will let us know.  Snoring-we will be happy to refer her to ENT since she had a negative study years ago though I do not have a copy of that study.  Explained that there are some oral devices and things like chinstrap that can sometimes be helpful particularly if she breathes with her mouth open.  She will let me know when she would like to have that referral placed.

## 2017-09-07 ENCOUNTER — Telehealth: Payer: Self-pay | Admitting: Family Medicine

## 2017-09-07 DIAGNOSIS — E039 Hypothyroidism, unspecified: Secondary | ICD-10-CM

## 2017-09-07 DIAGNOSIS — R002 Palpitations: Secondary | ICD-10-CM

## 2017-09-07 NOTE — Telephone Encounter (Signed)
lvm advising pt to rtn call.Anne [name], [name]

## 2017-09-07 NOTE — Telephone Encounter (Signed)
She should have enough Eliquis for 3 more weeks, unless she is requesting mail order in which case we can go ahead and send to the pharmacy.

## 2017-09-07 NOTE — Telephone Encounter (Signed)
Pt called clinic stating she needs to get her labs redone from her recent hospitalization. Pt reports she is unsure what the labs are specifically, but does know one of them to be to rechecked is her TSH.   Pt also needs a refill on her Eliquis. [facility]   Pt also request a callback from [place]. No details provided as to why. Will route.

## 2017-09-10 NOTE — Telephone Encounter (Signed)
Spoke w/pt and she wanted to be sure of the labs that needed to be ordered from when she was in the hospital that she was needing to have redrawn. Also she stated that she has an appt to have the stress test done on Friday and will let us know if they will take over the prescription for the eliquis or if Dr. Madilyn Fireman will need to. I told her that she has 3 wks left on her Rx.   She informed me that Dr. Kalman Shan had asked that she f/u with him on 4/10 for a 40 min visit and she wasn't sure what this was about since she had already advised that she was allowing Dr. Madilyn Fireman to take over her medications for her thyroid. She was wondering whether or not this was some sort of protocol? I informed her that I did not think that it was because if they were going to dismiss her as a patient they would just do this by letter more than likely they wouldn't waste time on a pt that they were just going to let go of. At least I didn't think that they would do something like this. I told her that it would be her decision whether or not she wanted to keep that appointment to see him or if she wanted to cancel it. This is totally up to her. I told her that I could not make that decision for her. She thanked me for my time and advice and said that she love me and Dr. Madilyn Fireman.[name], [place] Crocker, CMA

## 2017-09-10 NOTE — Telephone Encounter (Signed)
Routing to PCP regarding labs and what Pt needs to have redone? Then will contact regarding Eliquis Rx.

## 2017-09-10 NOTE — Telephone Encounter (Signed)
TSh and BMP. Thank you

## 2017-09-11 ENCOUNTER — Telehealth (HOSPITAL_COMMUNITY): Payer: Self-pay | Admitting: *Deleted

## 2017-09-11 NOTE — Telephone Encounter (Signed)
Left message on voicemail in reference to upcoming appointment scheduled for 09/14/17. Phone number given for a call back so details instructions can be given. Kirstie Peri

## 2017-09-12 ENCOUNTER — Telehealth (HOSPITAL_COMMUNITY): Payer: Self-pay | Admitting: *Deleted

## 2017-09-12 ENCOUNTER — Telehealth (HOSPITAL_COMMUNITY): Payer: Self-pay

## 2017-09-12 NOTE — Telephone Encounter (Signed)
Left message on voicemail in reference to upcoming appointment scheduled for 09/14/17 Phone number given for a call back so details instructions can be given.  Anne [name]

## 2017-09-12 NOTE — Telephone Encounter (Signed)
Patient given detailed instructions per Myocardial Perfusion Study Information Sheet for the test on 09/14/17 at 0715. Patient notified to arrive 15 minutes early and that it is imperative to arrive on time for appointment to keep from having the test rescheduled.  If you need to cancel or reschedule your appointment, please call the office within 24 hours of your appointment. . Patient verbalized understanding.T. Avanell Banwart, CNMT, RT-N

## 2017-09-14 ENCOUNTER — Ambulatory Visit (HOSPITAL_COMMUNITY): Payer: Medicare Other | Attending: Cardiovascular Disease

## 2017-09-14 DIAGNOSIS — R079 Chest pain, unspecified: Secondary | ICD-10-CM | POA: Diagnosis not present

## 2017-09-14 DIAGNOSIS — R002 Palpitations: Secondary | ICD-10-CM | POA: Diagnosis not present

## 2017-09-14 DIAGNOSIS — I4891 Unspecified atrial fibrillation: Secondary | ICD-10-CM | POA: Diagnosis not present

## 2017-09-14 DIAGNOSIS — I2510 Atherosclerotic heart disease of native coronary artery without angina pectoris: Secondary | ICD-10-CM | POA: Insufficient documentation

## 2017-09-14 LAB — MYOCARDIAL PERFUSION IMAGING
CHL CUP MPHR: 152 {beats}/min
CHL CUP NUCLEAR SRS: 6
CHL CUP NUCLEAR SSS: 13
CHL CUP RESTING HR STRESS: 77 {beats}/min
CSEPED: 4 min
CSEPEW: 6.7 METS
CSEPPHR: 150 {beats}/min
Exercise duration (sec): 45 s
LHR: 0.33
LV dias vol: 54 mL (ref 46–106)
LV sys vol: 11 mL
NUC STRESS TID: 0.89
Percent HR: 99 %
SDS: 7

## 2017-09-14 MED ORDER — TECHNETIUM TC 99M TETROFOSMIN IV KIT
10.5000 | PACK | Freq: Once | INTRAVENOUS | Status: AC | PRN
  Administered 2017-09-14: 10.5 via INTRAVENOUS
  Filled 2017-09-14: qty 11

## 2017-09-14 MED ORDER — TECHNETIUM TC 99M TETROFOSMIN IV KIT
31.7000 | PACK | Freq: Once | INTRAVENOUS | Status: AC | PRN
  Administered 2017-09-14: 31.7 via INTRAVENOUS
  Filled 2017-09-14: qty 32

## 2017-09-20 ENCOUNTER — Telehealth: Payer: Self-pay | Admitting: Cardiovascular Disease

## 2017-09-20 NOTE — Telephone Encounter (Signed)
Spoke with patient who called to ask about blood work that she was advised to get after discharge from hospital. Patient's levothyroxine dose was adjusted during hospitalization 3/8. She states she was advised to get a TSH level 3-4 weeks after the med adjustment. She states her PCP, Dr. Madilyn Fireman has placed orders for her to get TSH and BMET in [place] but she wondered if it needed to be done prior to appointment on 4/2 with Richardson Dopp, PA. I advised that lab work can be collected after her visit with Nicki Reaper in the event that he wants to order additional lab work. I advised her to come to her appointment in a fasting state for lab work and that we can share the results with Dr. Madilyn Fireman. She verbalized understanding and agreement with plan and thanked me for the call.

## 2017-09-20 NOTE — Telephone Encounter (Signed)
New message  Pt verbalized that she is calling for RN  She want to go over Dr.Nashers request for labs and her PCP request for labs

## 2017-09-25 ENCOUNTER — Ambulatory Visit (INDEPENDENT_AMBULATORY_CARE_PROVIDER_SITE_OTHER): Payer: Medicare Other | Admitting: Physician Assistant

## 2017-09-25 ENCOUNTER — Encounter: Payer: Self-pay | Admitting: Physician Assistant

## 2017-09-25 VITALS — BP 130/80 | HR 74 | Ht 65.0 in | Wt 168.1 lb

## 2017-09-25 DIAGNOSIS — E039 Hypothyroidism, unspecified: Secondary | ICD-10-CM

## 2017-09-25 DIAGNOSIS — I10 Essential (primary) hypertension: Secondary | ICD-10-CM

## 2017-09-25 DIAGNOSIS — I700 Atherosclerosis of aorta: Secondary | ICD-10-CM | POA: Diagnosis not present

## 2017-09-25 DIAGNOSIS — R0683 Snoring: Secondary | ICD-10-CM | POA: Diagnosis not present

## 2017-09-25 DIAGNOSIS — I480 Paroxysmal atrial fibrillation: Secondary | ICD-10-CM

## 2017-09-25 MED ORDER — METOPROLOL SUCCINATE ER 50 MG PO TB24: 50.0000 mg | ORAL_TABLET | Freq: Every day | ORAL | 3 refills | Status: DC

## 2017-09-25 MED ORDER — APIXABAN 5 MG PO TABS: 5.0000 mg | ORAL_TABLET | Freq: Two times a day (BID) | ORAL | 3 refills | Status: DC

## 2017-09-25 NOTE — Patient Instructions (Addendum)
Medication Instructions:  1. REFILLS HAVE BEEN SENT IN FOR TOPROL AND ELIQUIS  Labwork: 1. TODAY BMET, CBC, TSH,  FREE T4  Testing/Procedures: Your physician has recommended that you have a SPLIT NIGHT sleep study. This test records several body functions during sleep, including: brain activity, eye movement, oxygen and carbon dioxide blood levels, heart rate and rhythm, breathing rate and rhythm, the flow of air through your mouth and nose, snoring, body muscle movements, and chest and belly movement.    Follow-Up: 1. SCOTT WEAVER, PAC 3 MONTHS ; WE WILL SEND OUT A REMINDER LETTER A COUPLE OF MONTHS EARLIER ASKING FOR YOU TO CALL AND MAKE AN APPT   2. DR. Acie Fredrickson IN 6 MONTHS; WE WILL SEND OUT A REMINDER LETTER A COUPLE OF MONTHS EARLIER ASKING FOR YOU TO CALL AND MAKE AN APPT   Any Other Special Instructions Will Be Listed Below (If Applicable).     If you need a refill on your cardiac medications before your next appointment, please call your pharmacy.

## 2017-09-25 NOTE — Progress Notes (Signed)
Cardiology Office Note:    Date:  09/25/2017   ID:  Anne [name], DOB 02-May-1951, MRN [id number]  PCP:  [name], MD  Cardiologist:  Mertie Moores, MD   Referring MD: [name], *   Chief Complaint  Patient presents with  . Hospitalization Follow-up    admx with AFib    History of Present Illness:    Anne [name] is a 67 y.o. female with hypertension, hyperlipidemia, acid reflux, hypothyroidism status post prior thyroidectomy, palpitations.  Echocardiogram in December 2019 demonstrated normal LV function with mild diastolic dysfunction.  She was admitted 3/8-3/9 with chest pain, nausea, vomiting, diarrhea.  Upon presentation she was noted to be in atrial fibrillation with rapid ventricular rate.  She converted to normal sinus rhythm on IV diltiazem.  Metoprolol dose was increased and she was placed on Apixaban.  Synthroid dose was reduced secondary to depressed TSH.  Troponin levels were minimally elevated without clear trend.  Troponin elevation was felt to be related to demand ischemia.  She was given 1 dose of IV Lasix.  CHADS2-VASc= 3 (age x1, female, HTN).  It was questioned whether or not her atrial fibrillation may be related to hyperthyroid state.  However, for now, it was felt that she should remain on anticoagulation.  Outpatient stress test was arranged.  This demonstrated normal perfusion with EF 80%.     Anne [name] returns for post hospital follow up.  She is here with her husband.  She denies any chest pain, shortness of breath, PND, edema.  She denies syncope.  She denies any bleeding issues.  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  Her husband also notes that she snores loudly but has not witnessed apnea.  She did have a sleep study many years ago.  Prior CV studies:   The following studies were reviewed today:  Nuclear stress test 09/14/17 Normal resting and stress perfusion. No ischemia or infarction EF 80%  Chest CTA  08/31/17 IMPRESSION: 1. No pulmonary embolus. 2. Subtle upper lobe ground-glass opacities may be infectious or inflammatory. Dependent atelectasis in both lung bases. Aortic Atherosclerosis (ICD10-I70.0).  Echo 08/21/17 EF 55-60, normal wall motion, grade 1 diastolic dysfunction, mild PI, trivial TR  Past Medical History:  Diagnosis Date  . Acid reflux   . Aortic atherosclerosis ([place]) 04/28/2013   CT 08/2017  . Arthritis    "joints, hands" (08/30/2017)  . Chronic lower back pain   . Disc herniation    neck and low back  . Family history of adverse reaction to anesthesia    "daughter w/PONV"  . Follicular cystitis   . Hiatal hernia   . High cholesterol   . Hyperlipidemia    ELEV CHOLESTEROL  . Hypothyroidism   . Osteopenia 2014   T score -1.6 FRAX 13%/1%  . PAF (paroxysmal atrial fibrillation) ([place]) 08/30/2017   Echo 2/19: EF 55-60, normal wall motion, grade 1 diastolic dysfunction, mild PI, trivial TR //  Nuc 3/19: no ischemia or scar, EF 80 // CHADS2-VASc: 3 // Eliquis 5 bid   . Schatzki's ring    Surgical Hx: The patient  has a past surgical history that includes Thyroidectomy (1976); Hysteroscopy; Oophorectomy (Right, 04/2000); Ventral hernia repair (06/23/13); Varicose vein surgery (Bilateral, 1972); Hernia repair; Laparoscopic cholecystectomy (09/28/2011); Breast biopsy (Bilateral, 1997); Breast cyst aspiration (Right, ~ 1993); Cataract extraction w/ intraocular lens implant (Left, 2011); Cataract extraction w/ intraocular lens implant (Right, 12/20/2012); Dilation and curettage of uterus (1996); Tubal  ligation (1976); Eye muscle surgery (Bilateral, 1992); and Colonoscopy with esophagogastroduodenoscopy (egd) ("several").   Current Medications: Current Meds  Medication Sig  . apixaban (ELIQUIS) 5 MG TABS tablet Take 1 tablet (5 mg total) by mouth 2 (two) times daily.  [name] CALCIUM PO Take 1 tablet by mouth daily.   . Cholecalciferol (VITAMIN D) 2000 UNITS tablet Take 2,000 Units  by mouth daily.    [name] estradiol (ESTRACE) 0.1 MG/GM vaginal cream Place 3.81 Applicatorfuls vaginally as needed. For vaginal irritation  . levothyroxine (SYNTHROID, LEVOTHROID) 75 MCG tablet Take 1 tablet (75 mcg total) by mouth daily before breakfast.  . meloxicam (MOBIC) 15 MG tablet Take 1 tablet (15 mg total) by mouth daily as needed for pain.  . metoprolol succinate (TOPROL-XL) 50 MG 24 hr tablet Take 1 tablet (50 mg total) by mouth daily.  . Multiple Vitamin (MULTIVITAMIN) tablet Take 1 tablet by mouth daily.    [name] omeprazole (PRILOSEC) 40 MG capsule TAKE 1 CAPSULE (40 MG TOTAL) BY MOUTH DAILY.  . simvastatin (ZOCOR) 40 MG tablet TAKE ONE TABLET BY MOUTH ONCE DAILY AT 6 PM     Allergies:   Amoxicillin-pot clavulanate; Augmentin [amoxicillin-pot clavulanate]; Ceftriaxone sodium; Codeine; Fluconazole in dextrose; Molds & smuts; and Zofran [ondansetron hcl]   Social History   Tobacco Use  . Smoking status: Former Smoker    Packs/day: 0.50    Years: 18.00    Pack years: 9.00    Types: Cigarettes    Last attempt to quit: 01/27/1979    Years since quitting: 38.6  . Smokeless tobacco: Never Used  Substance Use Topics  . Alcohol use: Yes    Comment: 08/30/2017 "might have glass of wine twice/year"  . Drug use: No     Family Hx: The patient's family history includes Anuerysm in her father; Arthritis in her daughter and sister; Breast cancer in her sister; Cancer in her sister; Cancer (age of onset: 45) in her brother; Heart attack (age of onset: 22) in her father; Heart disease in her father; Hyperlipidemia in her brother; Hyperparathyroidism in her daughter; Hypertension in her brother and sister; Lupus in her sister; Stroke in her sister.  ROS:   Please see the history of present illness.    ROS All other systems reviewed and are negative.   EKGs/Labs/Other Test Reviewed:    EKG:  EKG is  ordered today.  The ekg ordered today demonstrates normal sinus rhythm, heart rate 74, left axis  deviation, QTC 430  Recent Labs: 08/30/2017: ALT 19; B Natriuretic Peptide 341.6; BUN 10; Creatinine, Ser 0.90; Hemoglobin 15.0; Magnesium 2.0; Platelets 194; Potassium 3.5; Sodium 136; TSH 0.240   Recent Lipid Panel Lab Results  Component Value Date/Time   CHOL 113 08/31/2017 02:33 AM   TRIG 101 08/31/2017 02:33 AM   HDL 32 (L) 08/31/2017 02:33 AM   CHOLHDL 3.5 08/31/2017 02:33 AM   LDLCALC 61 08/31/2017 02:33 AM    Physical Exam:    VS:  BP 130/80   Pulse 74   Ht 5\' 5"  (1.651 m)   Wt 168 lb 1.9 oz (76.3 kg)   LMP 09/08/2000   BMI 27.98 kg/m     Wt Readings from Last 3 Encounters:  09/25/17 168 lb 1.9 oz (76.3 kg)  09/14/17 168 lb (76.2 kg)  09/06/17 168 lb (76.2 kg)     Physical Exam  Constitutional: She is oriented to person, place, and time. She appears well-developed and well-nourished. No distress.  HENT:  Head: Normocephalic  and atraumatic.  Neck: No JVD present. Carotid bruit is not present.  Cardiovascular: Normal rate and regular rhythm.  No murmur heard. Pulmonary/Chest: Effort normal. She has no rales.  Abdominal: Soft.  Musculoskeletal: She exhibits no edema.  Neurological: She is alert and oriented to person, place, and time.  Skin: Skin is warm and dry.    ASSESSMENT & PLAN:    #1.  Paroxysmal atrial fibrillation ([facility]) Maintaining normal sinus rhythm.  There was a question whether or not her hyperthyroid state contributed to her atrial fibrillation.  However, at this point, it seems appropriate to continue on long-term anticoagulation.  We could certainly consider a 30-day event monitor at some point if we think that it is feasible for her to come off of anticoagulation.  Continue current dose of Apixaban.  Obtain BMET, CBC today.  She does have a history of snoring.  I will arrange split-night sleep study.  #2.  Essential hypertension The patient's blood pressure is controlled on her current regimen.  Continue current therapy.   #3.  Aortic  atherosclerosis ([place]) She is not on aspirin as she is on Apixaban.  Continue statin.  #4.  Hypothyroidism, unspecified type Draw follow-up TSH, free T4.  Continue follow-up with primary care.  #5.  Snoring Given history of paroxysmal atrial fibrillation, I will arrange a split-night sleep study to assess for sleep apnea.  Dispo:  Return in about 3 months (around 12/25/2017) for Routine Follow Up, w/ Richardson Dopp, PA-C.   Medication Adjustments/Labs and Tests Ordered: Current medicines are reviewed at length with the patient today.  Concerns regarding medicines are outlined above.  Tests Ordered: Orders Placed This Encounter  Procedures  . Basic metabolic panel  . CBC  . TSH  . T4, free  . EKG 12-Lead  . Split night study   Medication Changes: Meds ordered this encounter  Medications  . metoprolol succinate (TOPROL-XL) 50 MG 24 hr tablet    Sig: Take 1 tablet (50 mg total) by mouth daily.    Dispense:  90 tablet    Refill:  3  . apixaban (ELIQUIS) 5 MG TABS tablet    Sig: Take 1 tablet (5 mg total) by mouth 2 (two) times daily.    Dispense:  180 tablet    Refill:  3    Signed, Richardson Dopp, PA-C  09/25/2017 12:56 PM    [address], [place], [place]  [id number] Phone: [phone]; Fax: [phone]

## 2017-09-26 ENCOUNTER — Telehealth: Payer: Self-pay | Admitting: *Deleted

## 2017-09-26 DIAGNOSIS — K219 Gastro-esophageal reflux disease without esophagitis: Secondary | ICD-10-CM

## 2017-09-26 DIAGNOSIS — I480 Paroxysmal atrial fibrillation: Secondary | ICD-10-CM

## 2017-09-26 LAB — BASIC METABOLIC PANEL
BUN/Creatinine Ratio: 10 — ABNORMAL LOW (ref 12–28)
BUN: 9 mg/dL (ref 8–27)
CALCIUM: 9.5 mg/dL (ref 8.7–10.3)
CO2: 26 mmol/L (ref 20–29)
CREATININE: 0.87 mg/dL (ref 0.57–1.00)
Chloride: 103 mmol/L (ref 96–106)
GFR calc Af Amer: 79 mL/min/{1.73_m2} (ref >59)
GFR calc non Af Amer: 68 mL/min/{1.73_m2} (ref >59)
GLUCOSE: 104 mg/dL — AB (ref 65–99)
Potassium: 4.6 mmol/L (ref 3.5–5.2)
SODIUM: 143 mmol/L (ref 134–144)

## 2017-09-26 LAB — CBC
HEMATOCRIT: 42.1 % (ref 34.0–46.6)
Hemoglobin: 14.2 g/dL (ref 11.1–15.9)
MCH: 28.5 pg (ref 26.6–33.0)
MCHC: 33.7 g/dL (ref 31.5–35.7)
MCV: 85 fL (ref 79–97)
PLATELETS: 194 10*3/uL (ref 150–379)
RBC: 4.98 x10E6/uL (ref 3.77–5.28)
RDW: 14.8 % (ref 12.3–15.4)
WBC: 5.7 10*3/uL (ref 3.4–10.8)

## 2017-09-26 LAB — T4, FREE: Free T4: 1.55 ng/dL (ref 0.82–1.77)

## 2017-09-26 LAB — TSH: TSH: 1.19 u[IU]/mL (ref 0.450–4.500)

## 2017-09-26 NOTE — Telephone Encounter (Signed)
-----   Message from Michae Kava, [place] sent at 09/25/2017 12:02 PM EDT ----- Regarding: [place] afternoon Gae Bon,  Pt was seen today by Brynda Rim. PA. Pt will need to be set up for a Split Night Sleep Study. I am sorry I forgot to go over the questions with the pt to score for OSA. I hope you had a great day off !!   Thank you Arbie Cookey

## 2017-09-26 NOTE — Telephone Encounter (Signed)
Sent to precert 

## 2017-10-22 NOTE — Telephone Encounter (Signed)
Patient is scheduled for lab study on 11/12/17.Patient understands her sleep study will be done at [facility] sleep lab. Patient understands she will receive a sleep packet in a week or so. Patient understands to call if she does not receive the sleep packet in a timely manner. Patient agrees with treatment and thanked me for call.

## 2017-10-25 ENCOUNTER — Other Ambulatory Visit: Payer: Self-pay | Admitting: Physician Assistant

## 2017-10-25 DIAGNOSIS — R002 Palpitations: Secondary | ICD-10-CM

## 2017-11-09 ENCOUNTER — Telehealth: Payer: Self-pay | Admitting: Physician Assistant

## 2017-11-09 NOTE — Telephone Encounter (Signed)
I returned pt's call from earlier today. Pt wanted to know how should she explain to the Sleep Study Tech the symptoms where she wakes in the middle of the night hearing her heart beating in her ears. Pt wanted to know did th Sleep Study Tech now the reason why the study was ordered. I confirmed yes, they will be aware of reason why study was ordered. Pt also said they asked her if she takes ASA. She said no. Pt wanted to know is she supposed to be taking ASA. I explained to her she is on Eliquis she does not need to take ASA. Pt thanked me for my help in answering her questions.

## 2017-11-09 NOTE — Telephone Encounter (Signed)
New Message:       Pt states she has an sleep study coming up on Monday and she has questions about hearing her heartbeat in her ear. How can she explain that to the sleep study people because the form of questions does not address that.

## 2017-11-12 ENCOUNTER — Ambulatory Visit (HOSPITAL_BASED_OUTPATIENT_CLINIC_OR_DEPARTMENT_OTHER): Payer: Medicare Other

## 2017-12-04 ENCOUNTER — Ambulatory Visit (INDEPENDENT_AMBULATORY_CARE_PROVIDER_SITE_OTHER): Payer: Medicare Other | Admitting: Family Medicine

## 2017-12-04 ENCOUNTER — Encounter: Payer: Self-pay | Admitting: Family Medicine

## 2017-12-04 VITALS — BP 117/51 | HR 74 | Ht 65.0 in | Wt 168.0 lb

## 2017-12-04 DIAGNOSIS — K222 Esophageal obstruction: Secondary | ICD-10-CM | POA: Diagnosis not present

## 2017-12-04 DIAGNOSIS — R3915 Urgency of urination: Secondary | ICD-10-CM

## 2017-12-04 DIAGNOSIS — Z1211 Encounter for screening for malignant neoplasm of colon: Secondary | ICD-10-CM | POA: Diagnosis not present

## 2017-12-04 DIAGNOSIS — N3000 Acute cystitis without hematuria: Secondary | ICD-10-CM

## 2017-12-04 LAB — POCT URINALYSIS DIPSTICK
Bilirubin, UA: NEGATIVE
Glucose, UA: NEGATIVE
KETONES UA: NEGATIVE
Leukocytes, UA: NEGATIVE
NITRITE UA: NEGATIVE
PH UA: 6 (ref 5.0–8.0)
PROTEIN UA: NEGATIVE
RBC UA: NEGATIVE
Spec Grav, UA: <=1.005 — AB (ref 1.010–1.025)
UROBILINOGEN UA: 0.2 U/dL

## 2017-12-04 MED ORDER — SULFAMETHOXAZOLE-TRIMETHOPRIM 800-160 MG PO TABS: 1.0000 | ORAL_TABLET | Freq: Two times a day (BID) | ORAL | 0 refills | Status: DC

## 2017-12-04 MED ORDER — FLUCONAZOLE 150 MG PO TABS: 150.0000 mg | ORAL_TABLET | Freq: Once | ORAL | 0 refills | Status: AC

## 2017-12-04 NOTE — Progress Notes (Signed)
   Subjective:    Patient ID: Anne [name], female    DOB: 06-29-1950, 67 y.o.   MRN: [id number]  HPI 67 yo female with a hx of GERD and afib comes in today complaining of 6 days of urinary sxs including dysuria, frequency, pelvic discomfort and low back pain.  No fevers, sweats, or chills.  Using Tylenol for the low back pain.  She has had more gas recently.  She is not taking any Azo.   Also reminded her that she is actually due for another colonoscopy.  The last one was due June of last year per the report that I have from Dr. Lavina Hamman.  She also has a diagnosis of a Schatzki's ring and was told that when they do her scope again they would likely need to do an E GD.  Review of Systems     Objective:   Physical Exam  Constitutional: She is oriented to person, place, and time. She appears well-developed and well-nourished.  HENT:  Head: Normocephalic and atraumatic.  Eyes: Conjunctivae and EOM are normal.  Cardiovascular: Normal rate.  Pulmonary/Chest: Effort normal.  Musculoskeletal:  No CVA tenderness  Neurological: She is alert and oriented to person, place, and time.  Skin: Skin is dry. No pallor.  Psychiatric: She has a normal mood and affect. Her behavior is normal.  Vitals reviewed.      Assessment & Plan:  UTI -treat with Bactrim.  Increase water intake.  If not improving then please let me know.  Will send for urine culture.  She did let me know that she often gets yeast infections with antibiotics so sent over a tab of Diflucan for her to take.  Lung cancer screening/Schatzki's ring-refer back to Dr. Lavina Hamman for consultation so she can go ahead of have a colonoscopy and EGD done at the same time.  Hopefully if everything looks great she can go back to a 10-year recall.

## 2017-12-04 NOTE — Addendum Note (Signed)
Addended by: Teddy Spike on: 12/04/2017 12:14 PM   Modules accepted: Orders

## 2017-12-04 NOTE — Patient Instructions (Signed)

## 2017-12-05 LAB — URINE CULTURE
MICRO NUMBER:: 90700366
SPECIMEN QUALITY:: ADEQUATE

## 2017-12-06 ENCOUNTER — Encounter

## 2017-12-06 ENCOUNTER — Ambulatory Visit (HOSPITAL_BASED_OUTPATIENT_CLINIC_OR_DEPARTMENT_OTHER): Payer: Medicare Other | Attending: Physician Assistant | Admitting: Cardiology

## 2017-12-06 ENCOUNTER — Ambulatory Visit: Payer: Medicare Other | Admitting: Family Medicine

## 2017-12-06 VITALS — Ht 65.0 in | Wt 169.0 lb

## 2017-12-06 DIAGNOSIS — R0683 Snoring: Secondary | ICD-10-CM

## 2017-12-06 DIAGNOSIS — G478 Other sleep disorders: Secondary | ICD-10-CM | POA: Diagnosis not present

## 2017-12-06 DIAGNOSIS — I480 Paroxysmal atrial fibrillation: Secondary | ICD-10-CM

## 2017-12-06 NOTE — Progress Notes (Signed)
Pt has seen results on MyChart and message also sent for patient to call back if any questions.

## 2017-12-08 NOTE — Procedures (Signed)
   Patient Name: Anne [name], Anne [name] Study Date:06/12/2017 12/06/2017 Gender: Female D.O.B: 1951-01-17 Age (years): 67 Referring Provider: Richardson Dopp Height (inches): 32 Interpreting Physician: Fransico Him MD, ABSM Weight (lbs): 169 RPSGT: Baxter Flattery BMI: 28 MRN: [id number] Neck Size: 15.00  CLINICAL INFORMATION Sleep Study Type: NPSG  Indication for sleep study: Fatigue, Snoring, Witnesses Apnea / Gasping During Sleep  Epworth Sleepiness Score: 4  SLEEP STUDY TECHNIQUE As per the AASM Manual for the Scoring of Sleep and Associated Events v2.3 (April 2016) with a hypopnea requiring 4% desaturations.  The channels recorded and monitored were frontal, central and occipital EEG, electrooculogram (EOG), submentalis EMG (chin), nasal and oral airflow, thoracic and abdominal wall motion, anterior tibialis EMG, snore microphone, electrocardiogram, and pulse oximetry.  MEDICATIONS Medications self-administered by patient taken the night of the study : Eliquis  SLEEP ARCHITECTURE The study was initiated at 10:19:32 PM and ended at 4:54:48 AM.  Sleep onset time was 87.2 minutes and the sleep efficiency was 61.1%%. The total sleep time was 241.6 minutes.  Stage REM latency was 86.5 minutes.  The patient spent 3.7%% of the night in stage N1 sleep, 77.9%% in stage N2 sleep, 0.0%% in stage N3 and 18.42% in REM.  Alpha intrusion was absent.  Supine sleep was 34.18%.  RESPIRATORY PARAMETERS The overall apnea/hypopnea index (AHI) was 3.0 per hour. There were 10 total apneas, including 10 obstructive, 0 central and 0 mixed apneas. There were 2 hypopneas and 1 RERAs.  The AHI during Stage REM sleep was 0.0 per hour.  AHI while supine was 8.7 per hour.  The mean oxygen saturation was 93.3%. The minimum SpO2 during sleep was 87.0%.  loud snoring was noted during this study.  CARDIAC DATA The 2 lead EKG demonstrated sinus rhythm. The mean heart rate was 70.5 beats per minute. Other  EKG findings include: None.  LEG MOVEMENT DATA The total PLMS were 0 with a resulting PLMS index of 0.0. Associated arousal with leg movement index was 0.0 .  IMPRESSIONS - No significant obstructive sleep apnea occurred during this study (AHI = 3.0/h). - No significant central sleep apnea occurred during this study (CAI = 0.0/h). - Mild oxygen desaturation was noted during this study (Min O2 = 87.0%). - The patient snored with loud snoring volume. - No cardiac abnormalities were noted during this study. - Clinically significant periodic limb movements did not occur during sleep. No significant associated arousals.  DIAGNOSIS - Normal Study  RECOMMENDATIONS - Avoid alcohol, sedatives and other CNS depressants that may worsen sleep apnea and disrupt normal sleep architecture. - Sleep hygiene should be reviewed to assess factors that may improve sleep quality. - Weight management and regular exercise should be initiated or continued if appropriate.  [Electronically signed] 12/08/2017 05:24 PM  Fransico Him MD, ABSM Diplomate, American Board of Sleep Medicine

## 2017-12-13 ENCOUNTER — Telehealth: Payer: Self-pay | Admitting: *Deleted

## 2017-12-13 NOTE — Telephone Encounter (Signed)
Informed patient of sleep study results and patient understanding was verbalized. Patient understands her sleep study showed no significant sleep apnea.   Pt is aware and agreeable to normal results.  

## 2017-12-13 NOTE — Telephone Encounter (Signed)
-----   Message from Sueanne Margarita, MD sent at 12/08/2017  5:27 PM EDT ----- Please let patient know that sleep study showed no significant sleep apnea.

## 2017-12-25 ENCOUNTER — Other Ambulatory Visit: Payer: Self-pay | Admitting: Family Medicine

## 2017-12-25 ENCOUNTER — Encounter: Payer: Self-pay | Admitting: Physician Assistant

## 2017-12-25 ENCOUNTER — Ambulatory Visit: Payer: Medicare Other | Admitting: Physician Assistant

## 2017-12-25 ENCOUNTER — Ambulatory Visit (INDEPENDENT_AMBULATORY_CARE_PROVIDER_SITE_OTHER): Payer: Medicare Other | Admitting: Physician Assistant

## 2017-12-25 ENCOUNTER — Telehealth: Payer: Self-pay | Admitting: *Deleted

## 2017-12-25 VITALS — BP 120/78 | HR 78 | Ht 65.0 in | Wt 168.6 lb

## 2017-12-25 DIAGNOSIS — I10 Essential (primary) hypertension: Secondary | ICD-10-CM | POA: Insufficient documentation

## 2017-12-25 DIAGNOSIS — I480 Paroxysmal atrial fibrillation: Secondary | ICD-10-CM

## 2017-12-25 MED ORDER — METOPROLOL SUCCINATE ER 50 MG PO TB24: 75.0000 mg | ORAL_TABLET | ORAL | 3 refills | Status: DC

## 2017-12-25 NOTE — Telephone Encounter (Signed)
Informed patient of sleep study results and patient understanding was verbalized. Patient understands her sleep study showed no significant sleep apnea.   Pt is aware and agreeable to normal results.  

## 2017-12-25 NOTE — Patient Instructions (Signed)
Medication Instructions:  Increase Toprol XL to 75 mg Once daily (take 1 and 1/2 tablets of the 50 mg tablet to equal 75 mg) If this makes you fatigued, you can resume 50 mg (1 tablet) Once daily   Labwork: None   Testing/Procedures: None   Follow-Up: Mertie Moores, MD or Richardson Dopp, PA-C in 6 months.   Any Other Special Instructions Will Be Listed Below (If Applicable).  If you need a refill on your cardiac medications before your next appointment, please call your pharmacy.

## 2017-12-25 NOTE — Telephone Encounter (Signed)
-----   Message from Sueanne Margarita, MD sent at 12/08/2017  5:27 PM EDT ----- Please let patient know that sleep study showed no significant sleep apnea.

## 2017-12-25 NOTE — Progress Notes (Signed)
Cardiology Office Note:    Date:  12/25/2017   ID:  Anne [name], DOB 1950-11-07, MRN [id number]  PCP:  [name], MD  Cardiologist:  Mertie Moores, MD   Referring MD: [name], *   Chief Complaint  Patient presents with  . Follow-up    Atrial fibrillation    History of Present Illness:    Anne [name] is a 66 y.o. female with paroxysmal atrial fibrillation, hypertension, hyperlipidemia, hypothyroidism with prior thyroidectomy.  She presented in 08/2017 with atrial fibrillation with rapid ventricular rate and elevated Troponin levels consistent with demand ischemia.  She spontaneously converted to normal sinus rhythm.  She was placed on Apixaban for anticoagulation.   She was last seen 09/25/2017.  Sleep study in June 2019 was negative for obstructive sleep apnea.     CHADS2-VASc= 4 (age x1, female, HTN, aortic atherosclerosis).    Anne [name] returns for follow-up.  She is here with her husband.  She has occasional elevated heart rates but nothing like what she had when she presented to the hospital with atrial fibrillation.  She continues to her heartbeat in her ears when her heart rate is elevated.  We reviewed the results of her sleep study today.  The results were negative.  She does note a dry mouth in the mornings if she falls asleep on her back.  She needs a colonoscopy and EGD in the near future with gastroenterology.  She has a history of Schatzki's ring.  Prior CV studies:   The following studies were reviewed today:  Nuclear stress test 09/14/17 Normal resting and stress perfusion. No ischemia or infarction EF 80%  Chest CTA 08/31/17 IMPRESSION: 1. No pulmonary embolus. 2. Subtle upper lobe ground-glass opacities may be infectious or inflammatory. Dependent atelectasis in both lung bases. Aortic Atherosclerosis (ICD10-I70.0).  Echo 08/21/17 EF 55-60, normal wall motion, grade 1 diastolic dysfunction, mild PI, trivial TR  Past Medical  History:  Diagnosis Date  . Acid reflux   . Aortic atherosclerosis ([place]) 04/28/2013   CT 08/2017  . Arthritis    "joints, hands" (08/30/2017)  . Chronic lower back pain   . Disc herniation    neck and low back  . Family history of adverse reaction to anesthesia    "daughter w/PONV"  . Follicular cystitis   . Hiatal hernia   . High cholesterol   . Hyperlipidemia    ELEV CHOLESTEROL  . Hypothyroidism   . Osteopenia 2014   T score -1.6 FRAX 13%/1%  . PAF (paroxysmal atrial fibrillation) ([place]) 08/30/2017   Echo 2/19: EF 55-60, normal wall motion, grade 1 diastolic dysfunction, mild PI, trivial TR //  Nuc 3/19: no ischemia or scar, EF 80 // CHADS2-VASc: 3 // Eliquis 5 bid   . Schatzki's ring    Surgical Hx: The patient  has a past surgical history that includes Thyroidectomy (1976); Hysteroscopy; Oophorectomy (Right, 04/2000); Ventral hernia repair (06/23/13); Varicose vein surgery (Bilateral, 1972); Hernia repair; Laparoscopic cholecystectomy (09/28/2011); Breast biopsy (Bilateral, 1997); Breast cyst aspiration (Right, ~ 1993); Cataract extraction w/ intraocular lens implant (Left, 2011); Cataract extraction w/ intraocular lens implant (Right, 12/20/2012); Dilation and curettage of uterus (1996); Tubal ligation (1976); Eye muscle surgery (Bilateral, 1992); and Colonoscopy with esophagogastroduodenoscopy (egd) ("several").   Current Medications: Current Meds  Medication Sig  . apixaban (ELIQUIS) 5 MG TABS tablet Take 1 tablet (5 mg total) by mouth 2 (two) times daily.  [name] CALCIUM PO Take 1 tablet by mouth daily.   [name]  Cholecalciferol (VITAMIN D) 2000 UNITS tablet Take 2,000 Units by mouth daily.    [name] estradiol (ESTRACE) 0.1 MG/GM vaginal cream Place 7.49 Applicatorfuls vaginally as needed. For vaginal irritation  . levothyroxine (SYNTHROID, LEVOTHROID) 75 MCG tablet Take 1 tablet (75 mcg total) by mouth daily before breakfast.  . meloxicam (MOBIC) 15 MG tablet Take 1 tablet (15 mg total) by  mouth daily as needed for pain.  . Multiple Vitamin (MULTIVITAMIN) tablet Take 1 tablet by mouth daily.    [name] omeprazole (PRILOSEC) 40 MG capsule TAKE 1 CAPSULE (40 MG TOTAL) BY MOUTH DAILY.  . simvastatin (ZOCOR) 40 MG tablet TAKE 1 TABLET BY MOUTH EVERY DAY  . sulfamethoxazole-trimethoprim (BACTRIM DS,SEPTRA DS) 800-160 MG tablet Take 1 tablet by mouth 2 (two) times daily.  . [DISCONTINUED] metoprolol succinate (TOPROL-XL) 50 MG 24 hr tablet Take 1 tablet (50 mg total) by mouth daily.     Allergies:   Amoxicillin-pot clavulanate; Augmentin [amoxicillin-pot clavulanate]; Ceftriaxone sodium; Codeine; Fluconazole in dextrose; Molds & smuts; and Zofran [ondansetron hcl]   Social History   Tobacco Use  . Smoking status: Former Smoker    Packs/day: 0.50    Years: 18.00    Pack years: 9.00    Types: Cigarettes    Last attempt to quit: 01/27/1979    Years since quitting: 38.9  . Smokeless tobacco: Never Used  Substance Use Topics  . Alcohol use: Yes    Comment: 08/30/2017 "might have glass of wine twice/year"  . Drug use: No     Family Hx: The patient's family history includes Anuerysm in her father; Arthritis in her daughter and sister; Breast cancer in her sister; Cancer in her sister; Cancer (age of onset: 13) in her brother; Heart attack (age of onset: 78) in her father; Heart disease in her father; Hyperlipidemia in her brother; Hyperparathyroidism in her daughter; Hypertension in her brother and sister; Lupus in her sister; Stroke in her sister.  ROS:   Please see the history of present illness.    Review of Systems  Respiratory: Positive for snoring.    All other systems reviewed and are negative.   EKGs/Labs/Other Test Reviewed:    EKG:  EKG is not ordered today.    Recent Labs: 08/30/2017: ALT 19; B Natriuretic Peptide 341.6; Magnesium 2.0 09/25/2017: BUN 9; Creatinine, Ser 0.87; Hemoglobin 14.2; Platelets 194; Potassium 4.6; Sodium 143; TSH 1.190   Recent Lipid Panel Lab  Results  Component Value Date/Time   CHOL 113 08/31/2017 02:33 AM   TRIG 101 08/31/2017 02:33 AM   HDL 32 (L) 08/31/2017 02:33 AM   CHOLHDL 3.5 08/31/2017 02:33 AM   LDLCALC 61 08/31/2017 02:33 AM    Physical Exam:    VS:  BP 120/78   Pulse 78   Ht 5\' 5"  (1.651 m)   Wt 168 lb 9.6 oz (76.5 kg)   LMP 09/08/2000   SpO2 98%   BMI 28.06 kg/m     Wt Readings from Last 3 Encounters:  12/25/17 168 lb 9.6 oz (76.5 kg)  12/06/17 169 lb (76.7 kg)  12/04/17 168 lb (76.2 kg)     Physical Exam  Constitutional: She is oriented to person, place, and time. She appears well-developed and well-nourished. No distress.  HENT:  Head: Normocephalic and atraumatic.  Neck: Neck supple. No JVD present. Carotid bruit is not present.  Cardiovascular: Normal rate, regular rhythm, S1 normal, S2 normal and normal heart sounds.  No murmur heard. Pulmonary/Chest: Effort normal. She has no rales.  Abdominal: Soft. There is no hepatomegaly.  Musculoskeletal: She exhibits no edema.  Neurological: She is alert and oriented to person, place, and time.  Skin: Skin is warm and dry.    ASSESSMENT & PLAN:    Paroxysmal atrial fibrillation (HCC) Maintaining normal sinus rhythm by exam.  She does still have some episodes of feeling her heart rate elevated.  I have recommended she try to increase Toprol to 75 mg daily.  I would recommend continuing anticoagulation given her risk factors for stroke.  She is tolerating Apixaban 5 mg twice daily.  She needs an EGD in the near future.  If the gastroenterologist needs her anticoagulation held, she may hold this for 48 hours and resume postprocedure as soon as it is felt to be safe.  I will have her follow-up with Dr. Acie Fredrickson or me in 6 months   Dispo:  Return in about 6 months (around 06/27/2018) for Routine Follow Up, w/ Dr. Acie Fredrickson, or Richardson Dopp, PA-C.   Medication Adjustments/Labs and Tests Ordered: Current medicines are reviewed at length with the patient today.   Concerns regarding medicines are outlined above.  Tests Ordered: No orders of the defined types were placed in this encounter.  Medication Changes: Meds ordered this encounter  Medications  . metoprolol succinate (TOPROL XL) 50 MG 24 hr tablet    Sig: Take 2 tablets (100 mg total) by mouth as directed. Take 1 and 1/2 tabs daily to = 75 mg daily    Dispense:  135 tablet    Refill:  3    DOSE INCREASE TO 75 MG DAILY; PT TO TAKE 1 AND 1/2 MG DAILY OF THE 50 MG TABLET    Signed, Richardson Dopp, PA-C  12/25/2017 3:37 PM    [facility] [place], [place], [place]  [address] Phone: [phone]; Fax: [phone]

## 2018-01-02 DIAGNOSIS — Z8371 Family history of colonic polyps: Secondary | ICD-10-CM | POA: Diagnosis not present

## 2018-01-02 DIAGNOSIS — R1310 Dysphagia, unspecified: Secondary | ICD-10-CM | POA: Diagnosis not present

## 2018-01-07 ENCOUNTER — Ambulatory Visit (INDEPENDENT_AMBULATORY_CARE_PROVIDER_SITE_OTHER): Payer: Medicare Other | Admitting: Physician Assistant

## 2018-01-07 ENCOUNTER — Encounter: Payer: Self-pay | Admitting: Physician Assistant

## 2018-01-07 ENCOUNTER — Ambulatory Visit (INDEPENDENT_AMBULATORY_CARE_PROVIDER_SITE_OTHER): Payer: Medicare Other

## 2018-01-07 VITALS — BP 141/72 | HR 77 | Wt 169.0 lb

## 2018-01-07 DIAGNOSIS — T1490XA Injury, unspecified, initial encounter: Secondary | ICD-10-CM | POA: Diagnosis not present

## 2018-01-07 DIAGNOSIS — R0789 Other chest pain: Secondary | ICD-10-CM | POA: Diagnosis not present

## 2018-01-07 DIAGNOSIS — N644 Mastodynia: Secondary | ICD-10-CM | POA: Diagnosis not present

## 2018-01-07 MED ORDER — TRAMADOL HCL 50 MG PO TABS: 50.0000 mg | ORAL_TABLET | Freq: Four times a day (QID) | ORAL | 0 refills | Status: DC | PRN

## 2018-01-07 NOTE — Progress Notes (Signed)
Call pt: no fracture. Treatment plan stays the same.

## 2018-01-07 NOTE — Patient Instructions (Signed)
Costochondritis Costochondritis is swelling and irritation (inflammation) of the tissue (cartilage) that connects your ribs to your breastbone (sternum). This causes pain in the front of your chest. Usually, the pain:  Starts gradually.  Is in more than one rib.  This condition usually goes away on its own over time. Follow these instructions at home:  Do not do anything that makes your pain worse.  If directed, put ice on the painful area: ? Put ice in a plastic bag. ? Place a towel between your skin and the bag. ? Leave the ice on for 20 minutes, 2-3 times a day.  If directed, put heat on the affected area as often as told by your doctor. Use the heat source that your doctor tells you to use, such as a moist heat pack or a heating pad. ? Place a towel between your skin and the heat source. ? Leave the heat on for 20-30 minutes. ? Take off the heat if your skin turns bright red. This is very important if you cannot feel pain, heat, or cold. You may have a greater risk of getting burned.  Take over-the-counter and prescription medicines only as told by your doctor.  Return to your normal activities as told by your doctor. Ask your doctor what activities are safe for you.  Keep all follow-up visits as told by your doctor. This is important. Contact a doctor if:  You have chills or a fever.  Your pain does not go away or it gets worse.  You have a cough that does not go away. Get help right away if:  You are short of breath. This information is not intended to replace advice given to you by your health care provider. Make sure you discuss any questions you have with your health care provider. Document Released: 11/29/2007 Document Revised: 12/31/2015 Document Reviewed: 10/06/2015 Elsevier Interactive Patient Education  2018 Elsevier Inc.  

## 2018-01-07 NOTE — Progress Notes (Signed)
Subjective:    Patient ID: Anne [name], female    DOB: [birth year], 67 y.o.   MRN: [id number]  HPI  Pt is a 67 yr old female with a hx of osteopenia presenting to the clinic with left breast and chest wall pain. Pt says the pain started a week ago after an elevator closed on her left breast. Since then patient has been having intermittent 4/10 mid-sternal pain radiating to the left breast. Pt says it is uncomfortable to lay on her left side. Denies any cp, sob, skin changes, fever cough or chills. Pt has tried Tylenol with no relief. She already take mobic everyday. Pain worse with cough and deep breathing.   .. Active Ambulatory Problems    Diagnosis Date Noted  . Hypothyroidism 09/22/2008  . Hyperlipidemia 09/22/2008  . ALLERGIC RHINITIS 09/22/2008  . UNSPECIFIED DISEASE OF PHARYNX 12/16/2008  . FATIGUE 11/10/2008  . Intermittent palpitations 11/10/2008  . PERSONAL HISTORY OF ALLERGY TO LATEX 12/23/2009  . Disc herniation   . Osteopenia   . Thyroid disease   . Vaginal atrophy 11/14/2011  . Spinal stenosis of lumbar region 08/16/2012  . Schatzki's ring 10/16/2012  . Aortic atherosclerosis ([place]) 04/28/2013  . [place] 07/30/2017  . Paresthesia of both hands 07/30/2017  . 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 GERD (gastroesophageal reflux disease) 08/30/2017  . Chronic diastolic (congestive) heart failure ([place]) 08/30/2017  . Paroxysmal atrial fibrillation ([place]) 08/30/2017  . Essential hypertension 12/25/2017   Resolved Ambulatory Problems    Diagnosis Date Noted  . SINUSITIS - ACUTE-NOS 04/01/2010  . Hyperlipidemia   . Umbilical hernia 07/37/1062  . Atrial fibrillation with RVR ([place]) 08/30/2017  . Nausea & vomiting 08/30/2017  . Chest pain 08/30/2017  . Leukocytosis 08/30/2017  . Atypical pneumonia 08/31/2017  . Sepsis ([place]) 08/31/2017   Past Medical History:  Diagnosis Date  . Acid reflux   . Aortic atherosclerosis ([place]) 04/28/2013  . Arthritis   . Chronic  lower back pain   . Disc herniation   . Family history of adverse reaction to anesthesia   . Follicular cystitis   . Hiatal hernia   . High cholesterol   . Hyperlipidemia   . Hypothyroidism   . Osteopenia 2014  . PAF (paroxysmal atrial fibrillation) ([place]) 08/30/2017  . Schatzki's ring      Review of Systems  Constitutional: Negative for chills, fatigue and fever.  HENT: Negative.   Eyes: Negative.   Respiratory: Negative for cough, chest tightness, shortness of breath and wheezing.   Cardiovascular: Negative for chest pain and palpitations.  Musculoskeletal: Positive for myalgias (Midsternal tenderness. Left breast tenderness).  Skin: Negative for color change, rash and wound.  Hematological: Negative for adenopathy. Does not bruise/bleed easily.  All other systems reviewed and are negative.     Objective:   Physical Exam  Constitutional: She appears well-developed and well-nourished. No distress.  HENT:  Head: Normocephalic and atraumatic.  Eyes: Pupils are equal, round, and reactive to light. Conjunctivae and EOM are normal.  Neck: Normal range of motion.  Cardiovascular: Normal rate, regular rhythm and normal heart sounds.  Pulmonary/Chest: Effort normal and breath sounds normal. She has no wheezes. She has no rales. She exhibits tenderness (midsternal/ lateral chest tenderness) and bony tenderness (midsternal to left lateral chest tenderness). She exhibits no mass, no edema, no deformity and no swelling. Right breast exhibits no mass, no nipple discharge, no skin change and no tenderness. Left breast exhibits tenderness. Left breast exhibits no mass, no nipple  discharge and no skin change. No breast swelling. Breasts are symmetrical.  Where the tenderness are located    Musculoskeletal: Normal range of motion. She exhibits tenderness (left breast tenderness).  Skin: Skin is warm and dry. No rash noted. No erythema.       Assessment & Plan:  .Ryonna was seen today for  breast pain.  Diagnoses and all orders for this visit:  Injury -     traMADol (ULTRAM) 50 MG tablet; Take 1 tablet (50 mg total) by mouth every 6 (six) hours as needed. -     DG Ribs Unilateral W/Chest Left  Left-sided chest wall pain -     traMADol (ULTRAM) 50 MG tablet; Take 1 tablet (50 mg total) by mouth every 6 (six) hours as needed. -     DG Ribs Unilateral W/Chest Left  Breast pain, left -     traMADol (ULTRAM) 50 MG tablet; Take 1 tablet (50 mg total) by mouth every 6 (six) hours as needed.   -Pt has a hx of osteopenia and severe mid-sternal tenderness to palpation. Will order CXR to rule out fracture following chest injury. If CXR neg muscle strain vs costochondritis.      -CXR was neg for fracture.  -Encouraged icy hot/massage to the area. Continue to take Mobic/Tylenol  prn for pain.   -Tramadol prescribed as needed for moderate to severe pain. Discussed small quanity and abuse potential. [place] controlled substance database reviewed with no concern.   - Pt encouraged to take deep breath to avoid PNA following chest injury.  -Pt to come back if worsening pain, cp, sob, fever cough. Plan discussed with pt and pt voiced understanding.   [name].Spent 30 minutes with patient and greater than 50 percent of visit spent counseling patient regarding treatment plan.

## 2018-01-25 ENCOUNTER — Telehealth: Payer: Self-pay

## 2018-01-25 DIAGNOSIS — Z8371 Family history of colonic polyps: Secondary | ICD-10-CM | POA: Diagnosis not present

## 2018-01-25 DIAGNOSIS — R1310 Dysphagia, unspecified: Secondary | ICD-10-CM | POA: Diagnosis not present

## 2018-01-25 DIAGNOSIS — K219 Gastro-esophageal reflux disease without esophagitis: Secondary | ICD-10-CM | POA: Diagnosis not present

## 2018-01-25 DIAGNOSIS — K222 Esophageal obstruction: Secondary | ICD-10-CM | POA: Diagnosis not present

## 2018-01-25 NOTE — Telephone Encounter (Signed)
   [facility] Pre-operative Risk Assessment    Request for surgical clearance:  1. What type of surgery is being performed?  Colonoscopy and EGD   2. When is this surgery scheduled? 02/08/18   3. What type of clearance is required (medical clearance vs. Pharmacy clearance to hold med vs. Both)? Med  4. Are there any medications that need to be held prior to surgery and how long? Eliquis   5. Practice name and name of physician performing surgery?  [facility]   6. What is your office phone number [phone]    7.   What is your office fax number [phone]  8.   Anesthesia type (None, local, MAC, general) ? MAC   Anne [name] 01/25/2018, 10:49 AM  _________________________________________________________________   (provider comments below)

## 2018-01-25 NOTE — Telephone Encounter (Signed)
Patient with diagnosis of AFib on Eliquis for anticoagulation.    Procedure: COLONOSCOPE & ENDOSCOPY Date of procedure: 02/08/18  CHADS2-VASc score of  4 ( HTN, AGE,CAD, female)  CrCl = 71 ML/MIN  *AGREE WITH ORIGINAL RECOMMENDATION. PATIENT MAY HOLD ELIQUIS 1 TO 2 DAYS PRIOR TO PROCEDURE*   Patient should restart ELIQUIS on the evening of procedure or day after, at discretion of procedure MD

## 2018-01-25 NOTE — Telephone Encounter (Signed)
   Primary Cardiologist: Mertie Moores, MD  Chart reviewed as part of pre-operative protocol coverage. This patient was recently seen by Richardson Dopp PA-C 12/25/17 who stated, "She needs an EGD in the near future.  If the gastroenterologist needs her anticoagulation held, she may hold this for 48 hours and resume postprocedure as soon as it is felt to be safe."  I will route to our pharmacy team to ensure the same recommendation would hold true for colonoscopy as well.  Charlie Pitter, PA-C 01/25/2018, 1:16 PM

## 2018-01-31 NOTE — Telephone Encounter (Signed)
   Primary Cardiologist: Mertie Moores, MD  Chart reviewed as part of pre-operative protocol coverage. Given past medical history and time since last visit, based on ACC/AHA guidelines, Anne [name] would be at acceptable risk for the planned procedure without further cardiovascular testing.   According to our pharmacy protocol: CHADS2-VASc score of  4 ( HTN, AGE,CAD, female)  CrCl = 71 ML/MIN  PATIENT MAY HOLD ELIQUIS 1 TO 2 DAYS PRIOR TO PROCEDURE*   Patient should restart ELIQUIS on the evening of procedure or day after, at discretion of procedure MD  I will route this recommendation to the requesting party via [place] fax function and remove from pre-op pool.  Please call with questions.  [name], NP 01/31/2018, 3:48 PM

## 2018-02-08 ENCOUNTER — Encounter: Payer: Self-pay | Admitting: Family Medicine

## 2018-02-08 DIAGNOSIS — Z1211 Encounter for screening for malignant neoplasm of colon: Secondary | ICD-10-CM | POA: Insufficient documentation

## 2018-02-08 DIAGNOSIS — K219 Gastro-esophageal reflux disease without esophagitis: Secondary | ICD-10-CM | POA: Diagnosis not present

## 2018-02-08 DIAGNOSIS — K5730 Diverticulosis of large intestine without perforation or abscess without bleeding: Secondary | ICD-10-CM | POA: Diagnosis not present

## 2018-02-08 DIAGNOSIS — R1310 Dysphagia, unspecified: Secondary | ICD-10-CM | POA: Diagnosis not present

## 2018-02-08 DIAGNOSIS — Z8371 Family history of colonic polyps: Secondary | ICD-10-CM | POA: Diagnosis not present

## 2018-02-08 DIAGNOSIS — Z9889 Other specified postprocedural states: Secondary | ICD-10-CM | POA: Insufficient documentation

## 2018-02-13 ENCOUNTER — Ambulatory Visit
Admission: RE | Admit: 2018-02-13 | Discharge: 2018-02-13 | Disposition: A | Discharge status: Home / Self Care | Payer: Medicare Other | Source: Ambulatory Visit | Attending: Family Medicine | Admitting: Family Medicine

## 2018-02-13 DIAGNOSIS — Z139 Encounter for screening, unspecified: Secondary | ICD-10-CM

## 2018-02-13 DIAGNOSIS — Z1231 Encounter for screening mammogram for malignant neoplasm of breast: Secondary | ICD-10-CM | POA: Diagnosis not present

## 2018-03-19 ENCOUNTER — Other Ambulatory Visit: Payer: Self-pay | Admitting: Family Medicine

## 2018-03-29 ENCOUNTER — Other Ambulatory Visit: Payer: Self-pay | Admitting: *Deleted

## 2018-03-29 ENCOUNTER — Encounter: Payer: Self-pay | Admitting: Family Medicine

## 2018-03-29 ENCOUNTER — Ambulatory Visit (INDEPENDENT_AMBULATORY_CARE_PROVIDER_SITE_OTHER): Payer: Medicare Other | Admitting: Family Medicine

## 2018-03-29 VITALS — BP 125/60 | HR 74 | Temp 98.3°F | Ht 65.0 in | Wt 173.0 lb

## 2018-03-29 DIAGNOSIS — J0100 Acute maxillary sinusitis, unspecified: Secondary | ICD-10-CM | POA: Diagnosis not present

## 2018-03-29 DIAGNOSIS — E039 Hypothyroidism, unspecified: Secondary | ICD-10-CM

## 2018-03-29 DIAGNOSIS — H9311 Tinnitus, right ear: Secondary | ICD-10-CM | POA: Diagnosis not present

## 2018-03-29 MED ORDER — AZITHROMYCIN 250 MG PO TABS: 250.0000 mg | ORAL_TABLET | Freq: Every day | ORAL | 0 refills | Status: DC

## 2018-03-29 MED ORDER — SYNTHROID 75 MCG PO TABS: 75.0000 ug | ORAL_TABLET | Freq: Every day | ORAL | 2 refills | Status: DC

## 2018-03-29 MED ORDER — IPRATROPIUM BROMIDE 0.06 % NA SOLN: 2.0000 | NASAL | 6 refills | Status: DC | PRN

## 2018-03-29 NOTE — Patient Instructions (Signed)
Thank you for coming in today. Ok to take tylenol for pain, fever, body aches.  Take azithromycin daily for 5 days.  Use the nasal spray as needed.  Recheck if not improving or if worseng.  If not improving by Monday let me know and I can prescribe prednisone.  Return sooner if needed.    Sinusitis, Adult Sinusitis is soreness and inflammation of your sinuses. Sinuses are hollow spaces in the bones around your face. Your sinuses are located:  Around your eyes.  In the middle of your forehead.  Behind your nose.  In your cheekbones.  Your sinuses and nasal passages are lined with a stringy fluid (mucus). Mucus normally drains out of your sinuses. When your nasal tissues become inflamed or swollen, the mucus can become trapped or blocked so air cannot flow through your sinuses. This allows bacteria, viruses, and funguses to grow, which leads to infection. Sinusitis can develop quickly and last for 7?10 days (acute) or for more than 12 weeks (chronic). Sinusitis often develops after a cold. What are the causes? This condition is caused by anything that creates swelling in the sinuses or stops mucus from draining, including:  Allergies.  Asthma.  Bacterial or viral infection.  Abnormally shaped bones between the nasal passages.  Nasal growths that contain mucus (nasal polyps).  Narrow sinus openings.  Pollutants, such as chemicals or irritants in the air.  A foreign object stuck in the nose.  A fungal infection. This is rare.  What increases the risk? The following factors may make you more likely to develop this condition:  Having allergies or asthma.  Having had a recent cold or respiratory tract infection.  Having structural deformities or blockages in your nose or sinuses.  Having a weak immune system.  Doing a lot of swimming or diving.  Overusing nasal sprays.  Smoking.  What are the signs or symptoms? The main symptoms of this condition are pain and a  feeling of pressure around the affected sinuses. Other symptoms include:  Upper toothache.  Earache.  Headache.  Bad breath.  Decreased sense of smell and taste.  A cough that may get worse at night.  Fatigue.  Fever.  Thick drainage from your nose. The drainage is often green and it may contain pus (purulent).  Stuffy nose or congestion.  Postnasal drip. This is when extra mucus collects in the throat or back of the nose.  Swelling and warmth over the affected sinuses.  Sore throat.  Sensitivity to light.  How is this diagnosed? This condition is diagnosed based on symptoms, a medical history, and a physical exam. To find out if your condition is acute or chronic, your health care provider may:  Look in your nose for signs of nasal polyps.  Tap over the affected sinus to check for signs of infection.  View the inside of your sinuses using an imaging device that has a light attached (endoscope).  If your health care provider suspects that you have chronic sinusitis, you may also:  Be tested for allergies.  Have a sample of mucus taken from your nose (nasal culture) and checked for bacteria.  Have a mucus sample examined to see if your sinusitis is related to an allergy.  If your sinusitis does not respond to treatment and it lasts longer than 8 weeks, you may have an MRI or CT scan to check your sinuses. These scans also help to determine how severe your infection is. In rare cases, a bone biopsy  may be done to rule out more serious types of fungal sinus disease. How is this treated? Treatment for sinusitis depends on the cause and whether your condition is chronic or acute. If a virus is causing your sinusitis, your symptoms will go away on their own within 10 days. You may be given medicines to relieve your symptoms, including:  Topical nasal decongestants. They shrink swollen nasal passages and let mucus drain from your sinuses.  Antihistamines. These drugs  block inflammation that is triggered by allergies. This can help to ease swelling in your nose and sinuses.  Topical nasal corticosteroids. These are nasal sprays that ease inflammation and swelling in your nose and sinuses.  Nasal saline washes. These rinses can help to get rid of thick mucus in your nose.  If your condition is caused by bacteria, you will be given an antibiotic medicine. If your condition is caused by a fungus, you will be given an antifungal medicine. Surgery may be needed to correct underlying conditions, such as narrow nasal passages. Surgery may also be needed to remove polyps. Follow these instructions at home: Medicines  Take, use, or apply over-the-counter and prescription medicines only as told by your health care provider. These may include nasal sprays.  If you were prescribed an antibiotic medicine, take it as told by your health care provider. Do not stop taking the antibiotic even if you start to feel better. Hydrate and Humidify  Drink enough water to keep your urine clear or pale yellow. Staying hydrated will help to thin your mucus.  Use a cool mist humidifier to keep the humidity level in your home above 50%.  Inhale steam for 10-15 minutes, 3-4 times a day or as told by your health care provider. You can do this in the bathroom while a hot shower is running.  Limit your exposure to cool or dry air. Rest  Rest as much as possible.  Sleep with your head raised (elevated).  Make sure to get enough sleep each night. General instructions  Apply a warm, moist washcloth to your face 3-4 times a day or as told by your health care provider. This will help with discomfort.  Wash your hands often with soap and water to reduce your exposure to viruses and other germs. If soap and water are not available, use hand sanitizer.  Do not smoke. Avoid being around people who are smoking (secondhand smoke).  Keep all follow-up visits as told by your health care  provider. This is important. Contact a health care provider if:  You have a fever.  Your symptoms get worse.  Your symptoms do not improve within 10 days. Get help right away if:  You have a severe headache.  You have persistent vomiting.  You have pain or swelling around your face or eyes.  You have vision problems.  You develop confusion.  Your neck is stiff.  You have trouble breathing. This information is not intended to replace advice given to you by your health care provider. Make sure you discuss any questions you have with your health care provider. Document Released: 06/12/2005 Document Revised: 02/06/2016 Document Reviewed: 04/07/2015 Elsevier Interactive Patient Education  2018 [employer].  Tinnitus Tinnitus refers to hearing a sound when there is no actual source for that sound. This is often described as ringing in the ears. However, people with this condition may hear a variety of noises. A person may hear the sound in one ear or in both ears. The sounds of  tinnitus can be soft, loud, or somewhere in between. Tinnitus can last for a few seconds or can be constant for days. It may go away without treatment and come back at various times. When tinnitus is constant or happens often, it can lead to other problems, such as trouble sleeping and trouble concentrating. Almost everyone experiences tinnitus at some point. Tinnitus that is long-lasting (chronic) or comes back often is a problem that may require medical attention. What are the causes? The cause of tinnitus is often not known. In some cases, it can result from other problems or conditions, including:  Exposure to loud noises from machinery, music, or other sources.  Hearing loss.  Ear or sinus infections.  Earwax buildup.  A foreign object in the ear.  Use of certain medicines.  Use of alcohol and caffeine.  High blood pressure.  Heart diseases.  Anemia.  Allergies.  Meniere  disease.  Thyroid problems.  Tumors.  An enlarged part of a weakened blood vessel (aneurysm).  What are the signs or symptoms? The main symptom of tinnitus is hearing a sound when there is no source for that sound. It may sound like:  Buzzing.  Roaring.  Ringing.  Blowing air, similar to the sound heard when you listen to a seashell.  Hissing.  Whistling.  Sizzling.  Humming.  Running water.  A sustained musical note.  How is this diagnosed? Tinnitus is diagnosed based on your symptoms. Your health care provider will do a physical exam. A comprehensive hearing exam (audiologic exam) will be done if your tinnitus:  Affects only one ear (unilateral).  Causes hearing difficulties.  Lasts 6 months or longer.  You may also need to see a health care provider who specializes in hearing disorders (audiologist). You may be asked to complete a questionnaire to determine the severity of your tinnitus. Tests may be done to help determine the cause and to rule out other conditions. These can include:  Imaging studies of your head and brain, such as: ? A CT scan. ? An MRI.  An imaging study of your blood vessels (angiogram).  How is this treated? Treating an underlying medical condition can sometimes make tinnitus go away. If your tinnitus continues, other treatments may include:  Medicines, such as certain antidepressants or sleeping aids.  Sound generators to mask the tinnitus. These include: ? Tabletop sound machines that play relaxing sounds to help you fall asleep. ? Wearable devices that fit in your ear and play sounds or music. ? A small device that uses headphones to deliver a signal embedded in music (acoustic neural stimulation). In time, this may change the pathways of your brain and make you less sensitive to tinnitus. This device is used for very severe cases when no other treatment is working.  Therapy and counseling to help you manage the stress of living  with tinnitus.  Using hearing aids or cochlear implants, if your tinnitus is related to hearing loss.  Follow these instructions at home:  When possible, avoid being in loud places and being exposed to loud sounds.  Wear hearing protection, such as earplugs, when you are exposed to loud noises.  Do not take stimulants, such as nicotine, alcohol, or caffeine.  Practice techniques for reducing stress, such as meditation, yoga, or deep breathing.  Use a white noise machine, a humidifier, or other devices to mask the sound of tinnitus.  Sleep with your head slightly raised. This may reduce the impact of tinnitus.  Try to get  plenty of rest each night. Contact a health care provider if:  You have tinnitus in just one ear.  Your tinnitus continues for 3 weeks or longer without stopping.  Home care measures are not helping.  You have tinnitus after a head injury.  You have tinnitus along with any of the following: ? Dizziness. ? Loss of balance. ? Nausea and vomiting. This information is not intended to replace advice given to you by your health care provider. Make sure you discuss any questions you have with your health care provider. Document Released: 06/12/2005 Document Revised: 02/13/2016 Document Reviewed: 11/12/2013 Elsevier Interactive Patient Education  2018 [employer].

## 2018-03-29 NOTE — Progress Notes (Signed)
Anne [name] is a 67 y.o. female who presents to [facility]: [facility] today for nasal congestion runny nose nasal bleeding and tinnitus.  Patient notes a 8-day history of nasal irritation with right-sided nasal bleeding and discharge associate with a mild headache and mild sore throat.  She has postnasal drainage as well.  She notes also about 2 weeks ago she started developing right ear tinnitus.  She is not sure if the 2 are related.  She denies significant change in hearing in the right ear.  She is tried some Tylenol but no other treatment yet.  Tylenol was not very effective.  No fevers chills vomiting or diarrhea.   ROS as above:  Exam:  BP 125/60   Pulse 74   Temp 98.3 F (36.8 C) (Oral)   Ht 5\' 5"  (1.651 m)   Wt 173 lb (78.5 kg)   LMP 09/08/2000   SpO2 98%   BMI 28.79 kg/m  Wt Readings from Last 5 Encounters:  03/29/18 173 lb (78.5 kg)  01/07/18 169 lb (76.7 kg)  12/25/17 168 lb 9.6 oz (76.5 kg)  12/06/17 169 lb (76.7 kg)  12/04/17 168 lb (76.2 kg)    Gen: Well NAD HEENT: EOMI,  MMM tympanic membranes are normal-appearing bilaterally.  Posterior pharynx with cobblestoning.  Minimal cervical lymphadenopathy present.  Maxillary sinuses are tender on the right and mildly so on the left.  Inflamed nasal turbinates bilaterally with no obvious bleeding or ulceration. Lungs: Normal work of breathing. CTABL Heart: RRR no MRG Abd: NABS, Soft. Nondistended, Nontender Exts: Brisk capillary refill, warm and well perfused.   Lab and Radiology Results No results found for this or any previous visit (from the past 72 hour(s)). No results found.    Assessment and Plan: 67 y.o. female with  Sinusitis.  Patient describes second sickening and I am worried about potential bacterial sinus infection.  Plan for continued over-the-counter medications as well as trial of  azithromycin.  Will use Atrovent nasal spray as well.  If not improving on Monday patient will call and we will send a course of prednisone.  I like to avoid prednisone if possible.  However prednisone may help new onset tinnitus.  Recheck as needed.   No orders of the defined types were placed in this encounter.  Meds ordered this encounter  Medications  . azithromycin (ZITHROMAX) 250 MG tablet    Sig: Take 1 tablet (250 mg total) by mouth daily. Take first 2 tablets together, then 1 every day until finished.    Dispense:  6 tablet    Refill:  0  . ipratropium (ATROVENT) 0.06 % nasal spray    Sig: Place 2 sprays into both nostrils every 4 (four) hours as needed.    Dispense:  10 mL    Refill:  6     Historical information moved to improve visibility of documentation.  Past Medical History:  Diagnosis Date  . Acid reflux   . Aortic atherosclerosis ([place]) 04/28/2013   CT 08/2017  . Arthritis    "joints, hands" (08/30/2017)  . Chronic lower back pain   . Disc herniation    neck and low back  . Family history of adverse reaction to anesthesia    "daughter w/PONV"  . Follicular cystitis   . Hiatal hernia   . High cholesterol   . Hyperlipidemia    ELEV CHOLESTEROL  . Hypothyroidism   . Osteopenia 2014  T score -1.6 FRAX 13%/1%  . PAF (paroxysmal atrial fibrillation) ([place]) 08/30/2017   Echo 2/19: EF 55-60, normal wall motion, grade 1 diastolic dysfunction, mild PI, trivial TR //  Nuc 3/19: no ischemia or scar, EF 80 // CHADS2-VASc: 3 // Eliquis 5 bid   . Schatzki's ring    Past Surgical History:  Procedure Laterality Date  . BREAST BIOPSY Bilateral 1997   "excisional bx both benign"  . BREAST CYST ASPIRATION Right ~ 1993  . CATARACT EXTRACTION W/ INTRAOCULAR LENS IMPLANT Left 2011  . CATARACT EXTRACTION W/ INTRAOCULAR LENS IMPLANT Right 12/20/2012   Dr. Haynes Bast, OD  . COLONOSCOPY WITH ESOPHAGOGASTRODUODENOSCOPY (EGD)  "several"   "? dilated esophagus" (08/30/2017)  .  DILATION AND CURETTAGE OF UTERUS  1996  . EYE MUSCLE SURGERY Bilateral 1992  . HERNIA REPAIR    . HYSTEROSCOPY    . LAPAROSCOPIC CHOLECYSTECTOMY  09/28/2011  . OOPHORECTOMY Right 04/2000   LAP RSO/LYSIS OF ADHESIONS  . THYROIDECTOMY  1976  . TUBAL LIGATION  1976  . VARICOSE VEIN SURGERY Bilateral 1972  . VENTRAL HERNIA REPAIR  06/23/13   Dr. Franz Dell   Social History   Tobacco Use  . Smoking status: Former Smoker    Packs/day: 0.50    Years: 18.00    Pack years: 9.00    Types: Cigarettes    Last attempt to quit: 01/27/1979    Years since quitting: 39.1  . Smokeless tobacco: Never Used  Substance Use Topics  . Alcohol use: Yes    Comment: 08/30/2017 "might have glass of wine twice/year"   family history includes Anuerysm in her father; Arthritis in her daughter and sister; Breast cancer in her sister; Cancer in her sister; Cancer (age of onset: 70) in her brother; Heart attack (age of onset: 55) in her father; Heart disease in her father; Hyperlipidemia in her brother; Hyperparathyroidism in her daughter; Hypertension in her brother and sister; Lupus in her sister; Stroke in her sister.  Medications: Current Outpatient Medications  Medication Sig Dispense Refill  . apixaban (ELIQUIS) 5 MG TABS tablet Take 1 tablet (5 mg total) by mouth 2 (two) times daily. 180 tablet 3  . CALCIUM PO Take 1 tablet by mouth daily.     . Cholecalciferol (VITAMIN D) 2000 UNITS tablet Take 2,000 Units by mouth daily.      [name] estradiol (ESTRACE) 0.1 MG/GM vaginal cream Place 7.10 Applicatorfuls vaginally as needed. For vaginal irritation 42.5 g 2  . meloxicam (MOBIC) 15 MG tablet Take 1 tablet (15 mg total) by mouth daily as needed for pain. 90 tablet 1  . metoprolol succinate (TOPROL XL) 50 MG 24 hr tablet Take 2 tablets (100 mg total) by mouth as directed. Take 1 and 1/2 tabs daily to = 75 mg daily 135 tablet 3  . Multiple Vitamin (MULTIVITAMIN) tablet Take 1 tablet by mouth daily.      [name] omeprazole  (PRILOSEC) 40 MG capsule TAKE 1 CAPSULE (40 MG TOTAL) BY MOUTH DAILY. 90 capsule 1  . simvastatin (ZOCOR) 40 MG tablet TAKE 1 TABLET BY MOUTH EVERY DAY 90 tablet 1  . azithromycin (ZITHROMAX) 250 MG tablet Take 1 tablet (250 mg total) by mouth daily. Take first 2 tablets together, then 1 every day until finished. 6 tablet 0  . ipratropium (ATROVENT) 0.06 % nasal spray Place 2 sprays into both nostrils every 4 (four) hours as needed. 10 mL 6  . SYNTHROID 75 MCG tablet Take 1 tablet (75 mcg  total) by mouth daily before breakfast. 90 tablet 2   No current facility-administered medications for this visit.    Allergies  Allergen Reactions  . Amoxicillin-Pot Clavulanate     REACTION: blisters in colon  . Augmentin [Amoxicillin-Pot Clavulanate]     Colon blisters  . Ceftriaxone Sodium     REACTION: throat swells (ROCEPHIN)  . Codeine     REACTION: nausea  . Fluconazole In Dextrose     ? INTERACTION WITH SIMVASTATIN  . Molds & Smuts   . Zofran [Ondansetron Hcl]     Headaches     Discussed warning signs or symptoms. Please see discharge instructions. Patient expresses understanding.

## 2018-04-02 ENCOUNTER — Encounter: Payer: Self-pay | Admitting: Family Medicine

## 2018-04-03 MED ORDER — PREDNISONE 10 MG PO TABS: 30.0000 mg | ORAL_TABLET | Freq: Every day | ORAL | 0 refills | Status: DC

## 2018-04-04 ENCOUNTER — Encounter: Payer: Self-pay | Admitting: Family Medicine

## 2018-04-05 ENCOUNTER — Other Ambulatory Visit: Payer: Self-pay

## 2018-04-05 ENCOUNTER — Other Ambulatory Visit: Payer: Self-pay | Admitting: *Deleted

## 2018-04-05 DIAGNOSIS — M48061 Spinal stenosis, lumbar region without neurogenic claudication: Secondary | ICD-10-CM

## 2018-04-05 MED ORDER — MELOXICAM 15 MG PO TABS
15.0000 mg | ORAL_TABLET | Freq: Every day | ORAL | 1 refills | Status: DC | PRN
Start: 2018-04-05 — End: 2018-08-20

## 2018-04-05 NOTE — Telephone Encounter (Signed)
Pt calling requesting RF on Meloxicam.    Has been refilled by Dr Madilyn Fireman in the past, but last refill was sent 08/31/17 by Dr Wynelle Cleveland for #90 with 1 RF   RX pended, please send if appropriate

## 2018-04-12 ENCOUNTER — Telehealth: Payer: Self-pay

## 2018-04-12 MED ORDER — FLUCONAZOLE 150 MG PO TABS: 150.0000 mg | ORAL_TABLET | Freq: Once | ORAL | 1 refills | Status: AC

## 2018-04-12 NOTE — Telephone Encounter (Signed)
Left message on patient vm that medication was sent to pharmacy. Westin Knotts,CMA

## 2018-04-12 NOTE — Telephone Encounter (Signed)
Fluconazole sent to pharmacy.  Do not take simvastatin the same day take fluconazole.

## 2018-04-12 NOTE — Telephone Encounter (Signed)
Patient was seen on 03/29/18 for sinus problems and was given azithromycin. Patient reports that she now has a yeast infection and is requesting medication Diflucan sent to her pharmacy. Please advise. Rhonda Cunningham,CMA

## 2018-04-22 ENCOUNTER — Ambulatory Visit (INDEPENDENT_AMBULATORY_CARE_PROVIDER_SITE_OTHER): Payer: Medicare Other

## 2018-04-22 ENCOUNTER — Encounter: Payer: Self-pay | Admitting: Family Medicine

## 2018-04-22 ENCOUNTER — Ambulatory Visit (INDEPENDENT_AMBULATORY_CARE_PROVIDER_SITE_OTHER): Payer: Medicare Other | Admitting: Family Medicine

## 2018-04-22 VITALS — BP 112/64 | HR 84 | Ht 65.0 in | Wt 173.0 lb

## 2018-04-22 DIAGNOSIS — M4802 Spinal stenosis, cervical region: Secondary | ICD-10-CM | POA: Diagnosis not present

## 2018-04-22 DIAGNOSIS — R202 Paresthesia of skin: Secondary | ICD-10-CM | POA: Diagnosis not present

## 2018-04-22 DIAGNOSIS — R6889 Other general symptoms and signs: Secondary | ICD-10-CM

## 2018-04-22 DIAGNOSIS — M47812 Spondylosis without myelopathy or radiculopathy, cervical region: Secondary | ICD-10-CM | POA: Diagnosis not present

## 2018-04-22 DIAGNOSIS — R0989 Other specified symptoms and signs involving the circulatory and respiratory systems: Secondary | ICD-10-CM

## 2018-04-22 DIAGNOSIS — M542 Cervicalgia: Secondary | ICD-10-CM

## 2018-04-22 DIAGNOSIS — M50123 Cervical disc disorder at C6-C7 level with radiculopathy: Secondary | ICD-10-CM

## 2018-04-22 DIAGNOSIS — H9311 Tinnitus, right ear: Secondary | ICD-10-CM

## 2018-04-22 DIAGNOSIS — M50323 Other cervical disc degeneration at C6-C7 level: Secondary | ICD-10-CM | POA: Diagnosis not present

## 2018-04-22 MED ORDER — NYSTATIN 100000 UNIT/ML MT SUSP: 5.0000 mL | Freq: Four times a day (QID) | OROMUCOSAL | 0 refills | Status: DC

## 2018-04-22 NOTE — Progress Notes (Signed)
Subjective:    Patient ID: Anne [name], female    DOB: 1950-10-22, 67 y.o.   MRN: [id number]  HPI She has had some hissing in her right ear.  He was actually seen on October 4 approximately 4 weeks ago because of some nasal congestion runny nose and tinnitus in her right ear.  She was treated with azithromycin for a sinus infection.  He continued to have the ringing in her right ear and so was given a prescription for prednisone on the night which she completed.  Unfortunately it did not relieve her symptoms.  The antibiotic did improve her sinus congestion though.  She is checked her blood pressure to make sure that it is not related to high blood pressure levels and they have been pretty normal.  Unfortunately she still has the noise in her right ear.    She also complains of a sensation mostly on the right side of her neck.  Describes it as a crawling sensation.  She says it will feel almost itchy and quality and then it actually will feel tingling and numb.  The tingling will then wrap around to both sides of her face and sometimes she will actually get tingling down both arms.  This happens more so at night when she is trying to go to sleep.  She denies any new shampoo, conditioner etc.  She denies any rash on her scalp or new sores.  No fevers chills or sweats.  He also feels like since taking the antibiotic she is had a thick sensation in the back of her throat that is draining.  She is been trying salt water gargles and is really not been helping.  Review of Systems  BP 112/64   Pulse 84   Ht 5\' 5"  (1.651 m)   Wt 173 lb (78.5 kg)   LMP 09/08/2000   SpO2 98%   BMI 28.79 kg/m     Allergies  Allergen Reactions  . Amoxicillin-Pot Clavulanate     REACTION: blisters in colon  . Augmentin [Amoxicillin-Pot Clavulanate]     Colon blisters  . Ceftriaxone Sodium     REACTION: throat swells (ROCEPHIN)  . Codeine     REACTION: nausea  . Molds & Smuts   . Zofran [Ondansetron Hcl]      Headaches    Past Medical History:  Diagnosis Date  . Acid reflux   . Aortic atherosclerosis ([place]) 04/28/2013   CT 08/2017  . Arthritis    "joints, hands" (08/30/2017)  . Chronic lower back pain   . Disc herniation    neck and low back  . Family history of adverse reaction to anesthesia    "daughter w/PONV"  . Follicular cystitis   . Hiatal hernia   . High cholesterol   . Hyperlipidemia    ELEV CHOLESTEROL  . Hypothyroidism   . Osteopenia 2014   T score -1.6 FRAX 13%/1%  . PAF (paroxysmal atrial fibrillation) ([place]) 08/30/2017   Echo 2/19: EF 55-60, normal wall motion, grade 1 diastolic dysfunction, mild PI, trivial TR //  Nuc 3/19: no ischemia or scar, EF 80 // CHADS2-VASc: 3 // Eliquis 5 bid   . Schatzki's ring     Past Surgical History:  Procedure Laterality Date  . BREAST BIOPSY Bilateral 1997   "excisional bx both benign"  . BREAST CYST ASPIRATION Right ~ 1993  . CATARACT EXTRACTION W/ INTRAOCULAR LENS IMPLANT Left 2011  . CATARACT EXTRACTION W/ INTRAOCULAR LENS IMPLANT Right 12/20/2012  Dr. Haynes Bast, OD  . COLONOSCOPY WITH ESOPHAGOGASTRODUODENOSCOPY (EGD)  "several"   "? dilated esophagus" (08/30/2017)  . DILATION AND CURETTAGE OF UTERUS  1996  . EYE MUSCLE SURGERY Bilateral 1992  . HERNIA REPAIR    . HYSTEROSCOPY    . LAPAROSCOPIC CHOLECYSTECTOMY  09/28/2011  . OOPHORECTOMY Right 04/2000   LAP RSO/LYSIS OF ADHESIONS  . THYROIDECTOMY  1976  . TUBAL LIGATION  1976  . VARICOSE VEIN SURGERY Bilateral 1972  . VENTRAL HERNIA REPAIR  06/23/13   Dr. Franz Dell    Social History   Socioeconomic History  . Marital status: Married    Spouse name: [name]  . Number of children: 1  . Years of education: Not on file  . Highest education level: Not on file  Occupational History  . Not on file  Social Needs  . Financial resource strain: Not on file  . Food insecurity:    Worry: Not on file    Inability: Not on file  . Transportation needs:    Medical:  Not on file    Non-medical: Not on file  Tobacco Use  . Smoking status: Former Smoker    Packs/day: 0.50    Years: 18.00    Pack years: 9.00    Types: Cigarettes    Last attempt to quit: 01/27/1979    Years since quitting: 39.2  . Smokeless tobacco: Never Used  Substance and Sexual Activity  . Alcohol use: Yes    Comment: 08/30/2017 "might have glass of wine twice/year"  . Drug use: No  . Sexual activity: Not on file  Lifestyle  . Physical activity:    Days per week: Not on file    Minutes per session: Not on file  . Stress: Not on file  Relationships  . Social connections:    Talks on phone: Not on file    Gets together: Not on file    Attends religious service: Not on file    Active member of club or organization: Not on file    Attends meetings of clubs or organizations: Not on file    Relationship status: Not on file  . Intimate partner violence:    Fear of current or ex partner: Not on file    Emotionally abused: Not on file    Physically abused: Not on file    Forced sexual activity: Not on file  Other Topics Concern  . Not on file  Social History Narrative   4 brothers and 6 sisters. All 4 brother deceased.  2 sisters deceased.  No regular exercise. Still working.      Family History  Problem Relation Age of Onset  . Cancer Brother 38       melanoma  . Stroke Sister   . Hypertension Sister   . Arthritis Sister   . Lupus Sister   . Cancer Sister   . Breast cancer Sister        Age 20  . Hyperlipidemia Brother        deceased  . Hypertension Brother   . Arthritis Daughter        Rheumatoid  . Hyperparathyroidism Daughter   . Heart attack Father 11  . Heart disease Father   . Anuerysm Father     Outpatient Encounter Medications as of 04/22/2018  Medication Sig  . traMADol (ULTRAM) 50 MG tablet Take by mouth.  [name] apixaban (ELIQUIS) 5 MG TABS tablet Take 1 tablet (5 mg total) by mouth 2 (two) times  daily.  [name] CALCIUM PO Take 1 tablet by mouth daily.   .  Cholecalciferol (VITAMIN D) 2000 UNITS tablet Take 2,000 Units by mouth daily.    [name] estradiol (ESTRACE) 0.1 MG/GM vaginal cream Place 4.17 Applicatorfuls vaginally as needed. For vaginal irritation  . meloxicam (MOBIC) 15 MG tablet Take 1 tablet (15 mg total) by mouth daily as needed for pain.  . metoprolol succinate (TOPROL XL) 50 MG 24 hr tablet Take 2 tablets (100 mg total) by mouth as directed. Take 1 and 1/2 tabs daily to = 75 mg daily  . Multiple Vitamin (MULTIVITAMIN) tablet Take 1 tablet by mouth daily.    [name] nystatin (MYCOSTATIN) 100000 UNIT/ML suspension Take 5 mLs (500,000 Units total) by mouth 4 (four) times daily. X 1 week. Swish and hold in mouth for at least 2 minutes and then swallow.  [name] omeprazole (PRILOSEC) 40 MG capsule TAKE 1 CAPSULE (40 MG TOTAL) BY MOUTH DAILY.  . simvastatin (ZOCOR) 40 MG tablet TAKE 1 TABLET BY MOUTH EVERY DAY  . SYNTHROID 75 MCG tablet Take 1 tablet (75 mcg total) by mouth daily before breakfast.  . [DISCONTINUED] azithromycin (ZITHROMAX) 250 MG tablet Take 1 tablet (250 mg total) by mouth daily. Take first 2 tablets together, then 1 every day until finished.  . [DISCONTINUED] ipratropium (ATROVENT) 0.06 % nasal spray Place 2 sprays into both nostrils every 4 (four) hours as needed.  . [DISCONTINUED] predniSONE (DELTASONE) 10 MG tablet Take 3 tablets (30 mg total) by mouth daily with breakfast.   No facility-administered encounter medications on file as of 04/22/2018.          Objective:   Physical Exam  Constitutional: She is oriented to person, place, and time. She appears well-developed and well-nourished.  HENT:  Head: Normocephalic and atraumatic.  Right Ear: External ear normal.  Left Ear: External ear normal.  Nose: Nose normal.  Mouth/Throat: Oropharynx is clear and moist. No oropharyngeal exudate.  TMs and canals are clear bilaterally.  Eyes: Conjunctivae and EOM are normal.  Cardiovascular: Normal rate.  Pulmonary/Chest: Effort normal.   Musculoskeletal:  Fairly normal cervical flexion but pain with extension over the mid part of the cervical spine.  She has decreased rotation compared to the left and pain with side bending to the right on the right side of her neck compared to the left side.  Shoulders with normal range of motion and strength.  No weakness in the extremities.  Neurological: She is alert and oriented to person, place, and time.  Skin: Skin is dry. No pallor.  Psychiatric: She has a normal mood and affect. Her behavior is normal.  Vitals reviewed.       Assessment & Plan:  Right tinnitus - tympanometry -not responding to antibiotic or prednisone.  Tympanogram shows positive tympanic peak pressure but only mild increase in peak pressure.  Cervical paresthesias that radiate bilaterally.  Unclear etiology.  It sounds like it somewhat positional so I suspect that she is getting a pinched nerve in her neck that is causing it probably from a cervical disc issue.  She does have a lump in the middle of the cervical spine which is a little unusual though it is mobile and was like it soft tissue.  Would recommend that we start with a cervical plain film and go from there.  Fullness/thickness in the back of the throat-we will treat with nystatin swish and swallow. Consider thrush with recent antibiotic and prednisone use

## 2018-05-17 DIAGNOSIS — R0989 Other specified symptoms and signs involving the circulatory and respiratory systems: Secondary | ICD-10-CM | POA: Diagnosis not present

## 2018-05-17 DIAGNOSIS — H9313 Tinnitus, bilateral: Secondary | ICD-10-CM | POA: Diagnosis not present

## 2018-05-17 DIAGNOSIS — H838X3 Other specified diseases of inner ear, bilateral: Secondary | ICD-10-CM | POA: Diagnosis not present

## 2018-05-17 DIAGNOSIS — H918X1 Other specified hearing loss, right ear: Secondary | ICD-10-CM | POA: Diagnosis not present

## 2018-05-17 DIAGNOSIS — H903 Sensorineural hearing loss, bilateral: Secondary | ICD-10-CM | POA: Diagnosis not present

## 2018-05-17 DIAGNOSIS — H9311 Tinnitus, right ear: Secondary | ICD-10-CM | POA: Diagnosis not present

## 2018-05-21 DIAGNOSIS — H903 Sensorineural hearing loss, bilateral: Secondary | ICD-10-CM | POA: Diagnosis not present

## 2018-06-14 ENCOUNTER — Encounter: Payer: Self-pay | Admitting: Cardiovascular Disease

## 2018-06-14 ENCOUNTER — Ambulatory Visit (INDEPENDENT_AMBULATORY_CARE_PROVIDER_SITE_OTHER): Payer: Medicare Other | Admitting: Cardiovascular Disease

## 2018-06-14 VITALS — BP 120/76 | HR 65 | Ht 65.0 in | Wt 175.8 lb

## 2018-06-14 DIAGNOSIS — I5032 Chronic diastolic (congestive) heart failure: Secondary | ICD-10-CM | POA: Diagnosis not present

## 2018-06-14 DIAGNOSIS — I480 Paroxysmal atrial fibrillation: Secondary | ICD-10-CM | POA: Diagnosis not present

## 2018-06-14 NOTE — Patient Instructions (Signed)
Medication Instructions:  Your physician recommends that you continue on your current medications as directed. Please refer to the Current Medication list given to you today.  If you need a refill on your cardiac medications before your next appointment, please call your pharmacy.    Lab work: None Ordered   Testing/Procedures: None Ordered   Follow-Up: At [facility], you and your health needs are our priority.  As part of our continuing mission to provide you with exceptional heart care, we have created designated Provider Care Teams.  These Care Teams include your primary Cardiologist (physician) and Advanced Practice Providers (APPs -  Physician Assistants and Nurse Practitioners) who all work together to provide you with the care you need, when you need it. You will need a follow up appointment in:  6 months.  You may see Mertie Moores, MD or one of the following Advanced Practice Providers on your designated Care Team: Richardson Dopp, PA-C [place], [place] . [name], NP

## 2018-06-14 NOTE — Progress Notes (Signed)
Cardiology Office Note:    Date:  06/14/2018   ID:  NEA GITTENS, DOB 06/20/1951, MRN [id number]  PCP:  [name], MD  Cardiologist:  Mertie Moores, MD  Electrophysiologist:  None   Referring MD: [name], *   Chief Complaint  Patient presents with  . Atrial Fibrillation     History of Present Illness:    Anne [name] is a 67 y.o. female with a hx of paroxysmal atrial fibrillation, hypertension, hyperlipidemia, hypothyroidism with prior thyroid thyroidectomy.    I met her in March , 2019 in the hospital when she was admitted with rapid AFib. The Afib Spontaneously converted.  She had a mild troponin bump.     Her TSH was very low and it was thought that she may have been over replaced with her Synthroid. Sleep study in June, 2019 was negative for obstructive sleep apnea.  He was recently seen by Richardson Dopp, PA in July for further evaluation of rapid atrial fibrillation.   CHADS2-VASc=4 (age x1, female, HTN, aortic atherosclerosis).   Dec. 20, 2019 Laynee is seen with husband, Clair Gulling   No further episodes of Afib Uses a Kardia monitor.   Husband also has Afib  Has a hissing sound in her right ear ,  Has seen ENT.     Past Medical History:  Diagnosis Date  . Acid reflux   . Aortic atherosclerosis ([place]) 04/28/2013   CT 08/2017  . Arthritis    "joints, hands" (08/30/2017)  . Chronic lower back pain   . Disc herniation    neck and low back  . Family history of adverse reaction to anesthesia    "daughter w/PONV"  . Follicular cystitis   . Hiatal hernia   . High cholesterol   . Hyperlipidemia    ELEV CHOLESTEROL  . Hypothyroidism   . Osteopenia 2014   T score -1.6 FRAX 13%/1%  . PAF (paroxysmal atrial fibrillation) ([place]) 08/30/2017   Echo 2/19: EF 55-60, normal wall motion, grade 1 diastolic dysfunction, mild PI, trivial TR //  Nuc 3/19: no ischemia or scar, EF 80 // CHADS2-VASc: 3 // Eliquis 5 bid   . Schatzki's ring     Past  Surgical History:  Procedure Laterality Date  . BREAST BIOPSY Bilateral 1997   "excisional bx both benign"  . BREAST CYST ASPIRATION Right ~ 1993  . CATARACT EXTRACTION W/ INTRAOCULAR LENS IMPLANT Left 2011  . CATARACT EXTRACTION W/ INTRAOCULAR LENS IMPLANT Right 12/20/2012   Dr. Haynes Bast, OD  . COLONOSCOPY WITH ESOPHAGOGASTRODUODENOSCOPY (EGD)  "several"   "? dilated esophagus" (08/30/2017)  . DILATION AND CURETTAGE OF UTERUS  1996  . EYE MUSCLE SURGERY Bilateral 1992  . HERNIA REPAIR    . HYSTEROSCOPY    . LAPAROSCOPIC CHOLECYSTECTOMY  09/28/2011  . OOPHORECTOMY Right 04/2000   LAP RSO/LYSIS OF ADHESIONS  . THYROIDECTOMY  1976  . TUBAL LIGATION  1976  . VARICOSE VEIN SURGERY Bilateral 1972  . VENTRAL HERNIA REPAIR  06/23/13   Dr. Franz Dell    Current Medications: Current Meds  Medication Sig  . apixaban (ELIQUIS) 5 MG TABS tablet Take 1 tablet (5 mg total) by mouth 2 (two) times daily.  [name] CALCIUM PO Take 1 tablet by mouth daily.   . Cholecalciferol (VITAMIN D) 2000 UNITS tablet Take 2,000 Units by mouth daily.    [name] estradiol (ESTRACE) 0.1 MG/GM vaginal cream Place 0.03 Applicatorfuls vaginally as needed. For vaginal irritation  . meloxicam (MOBIC) 15  MG tablet Take 1 tablet (15 mg total) by mouth daily as needed for pain.  . metoprolol succinate (TOPROL-XL) 50 MG 24 hr tablet Take 50 mg by mouth daily. Take with or immediately following a meal.  . Multiple Vitamin (MULTIVITAMIN) tablet Take 1 tablet by mouth daily.    [name] omeprazole (PRILOSEC) 40 MG capsule TAKE 1 CAPSULE (40 MG TOTAL) BY MOUTH DAILY.  . simvastatin (ZOCOR) 40 MG tablet TAKE 1 TABLET BY MOUTH EVERY DAY  . SYNTHROID 75 MCG tablet Take 1 tablet (75 mcg total) by mouth daily before breakfast.     Allergies:   Amoxicillin-pot clavulanate; Amoxicillin-pot clavulanate; Ceftriaxone sodium; Codeine; Molds & smuts; and Zofran [ondansetron hcl]   Social History   Socioeconomic History  . Marital status:  Married    Spouse name: [name]  . Number of children: 1  . Years of education: Not on file  . Highest education level: Not on file  Occupational History  . Not on file  Social Needs  . Financial resource strain: Not on file  . Food insecurity:    Worry: Not on file    Inability: Not on file  . Transportation needs:    Medical: Not on file    Non-medical: Not on file  Tobacco Use  . Smoking status: Former Smoker    Packs/day: 0.50    Years: 18.00    Pack years: 9.00    Types: Cigarettes    Last attempt to quit: 01/27/1979    Years since quitting: 39.4  . Smokeless tobacco: Never Used  Substance and Sexual Activity  . Alcohol use: Yes    Comment: 08/30/2017 "might have glass of wine twice/year"  . Drug use: No  . Sexual activity: Not on file  Lifestyle  . Physical activity:    Days per week: Not on file    Minutes per session: Not on file  . Stress: Not on file  Relationships  . Social connections:    Talks on phone: Not on file    Gets together: Not on file    Attends religious service: Not on file    Active member of club or organization: Not on file    Attends meetings of clubs or organizations: Not on file    Relationship status: Not on file  Other Topics Concern  . Not on file  Social History Narrative   4 brothers and 6 sisters. All 4 brother deceased.  2 sisters deceased.  No regular exercise. Still working.       Family History: The patient's family history includes Anuerysm in her father; Arthritis in her daughter and sister; Breast cancer in her sister; Cancer in her sister; Cancer (age of onset: 40) in her brother; Heart attack (age of onset: 77) in her father; Heart disease in her father; Hyperlipidemia in her brother; Hyperparathyroidism in her daughter; Hypertension in her brother and sister; Lupus in her sister; Stroke in her sister.  ROS:   Please see the history of present illness.     All other systems reviewed and are negative.  EKGs/Labs/Other  Studies Reviewed:    The following studies were reviewed today:   EKG:    Recent Labs: 08/30/2017: ALT 19; B Natriuretic Peptide 341.6; Magnesium 2.0 09/25/2017: BUN 9; Creatinine, Ser 0.87; Hemoglobin 14.2; Platelets 194; Potassium 4.6; Sodium 143; TSH 1.190  Recent Lipid Panel    Component Value Date/Time   CHOL 113 08/31/2017 0233   TRIG 101 08/31/2017 0233   HDL 32 (  L) 08/31/2017 0233   CHOLHDL 3.5 08/31/2017 0233   VLDL 20 08/31/2017 0233   LDLCALC 61 08/31/2017 0233    Physical Exam:    VS:  BP 120/76   Pulse 65   Ht 5' 5"  (1.651 m)   Wt 175 lb 12.8 oz (79.7 kg)   LMP 09/08/2000   SpO2 98%   BMI 29.25 kg/m     Wt Readings from Last 3 Encounters:  06/14/18 175 lb 12.8 oz (79.7 kg)  04/22/18 173 lb (78.5 kg)  03/29/18 173 lb (78.5 kg)     GEN:  Middle age female,  NAD  HEENT: Normal NECK: No JVD; No carotid bruits LYMPHATICS: No lymphadenopathy CARDIAC: RRR, no murmurs, rubs, gallops RESPIRATORY:  Clear to auscultation without rales, wheezing or rhonchi  ABDOMEN: Soft, non-tender, non-distended MUSCULOSKELETAL:  No edema; No deformity  SKIN: Warm and dry NEUROLOGIC:  Alert and oriented x 3 PSYCHIATRIC:  Normal affect   ASSESSMENT:    No diagnosis found. PLAN:    In order of problems listed above:  1.  Paroxysmal Atrial fib:    Has maintained NSR.   In discussing this with her,  This may have been related to a bad URI or food poisoning that was occurring at the same time   Has converted to NSR   Would continue Eliquis.  Have her see Richardson Dopp, PA in 6 months.  I will plan on seeing her in 1 year.  2.  Hypothyroidism:     Medication Adjustments/Labs and Tests Ordered: Current medicines are reviewed at length with the patient today.  Concerns regarding medicines are outlined above.  No orders of the defined types were placed in this encounter.  No orders of the defined types were placed in this encounter.   Patient Instructions  Medication  Instructions:  Your physician recommends that you continue on your current medications as directed. Please refer to the Current Medication list given to you today.  If you need a refill on your cardiac medications before your next appointment, please call your pharmacy.    Lab work: None Ordered   Testing/Procedures: None Ordered   Follow-Up: At [facility], you and your health needs are our priority.  As part of our continuing mission to provide you with exceptional heart care, we have created designated Provider Care Teams.  These Care Teams include your primary Cardiologist (physician) and Advanced Practice Providers (APPs -  Physician Assistants and Nurse Practitioners) who all work together to provide you with the care you need, when you need it. You will need a follow up appointment in:  6 months.  You may see Mertie Moores, MD or one of the following Advanced Practice Providers on your designated Care Team: Richardson Dopp, PA-C [place], [place] . [name], NP      Signed, Mertie Moores, MD  06/14/2018 5:54 PM    [place]

## 2018-07-03 NOTE — Progress Notes (Deleted)
Subjective:   Anne [name] is a 68 y.o. female who presents for Medicare Annual (Subsequent) preventive examination.  Review of Systems:  No ROS.  Medicare Wellness Visit. Additional risk factors are reflected in the social history.    Sleep patterns: {SX; SLEEP PATTERNS:18802::"feels rested on waking","does not get up to void","gets up *** times nightly to void","sleeps *** hours nightly"}.   Home Safety/Smoke Alarms: Feels safe in home. Smoke alarms in place.  Living environment; residence and Firearm Safety: {Rehab home environment / accessibility:30080::"no firearms","firearms stored safely"}. Seat Belt Safety/Bike Helmet: Wears seat belt.   Female:   Pap- aged out unless necessary      Mammo-  utd     Dexa scan-        CCS- utd     Objective:     Vitals: LMP 09/08/2000   There is no height or weight on file to calculate BMI.  Advanced Directives 12/06/2017 08/30/2017 08/02/2017 01/25/2015 11/11/2014 09/29/2013  Does Patient Have a Medical Advance Directive? No No No No No Patient does not have advance directive;Patient would like information  Would patient like information on creating a medical advance directive? No - Patient declined No - Patient declined No - Patient declined No - patient declined information - Advance directive packet given    Tobacco Social History   Tobacco Use  Smoking Status Former Smoker  . Packs/day: 0.50  . Years: 18.00  . Pack years: 9.00  . Types: Cigarettes  . Last attempt to quit: 01/27/1979  . Years since quitting: 39.4  Smokeless Tobacco Never Used     Counseling given: Not Answered   Clinical Intake:                       Past Medical History:  Diagnosis Date  . Acid reflux   . Aortic atherosclerosis ([place]) 04/28/2013   CT 08/2017  . Arthritis    "joints, hands" (08/30/2017)  . Chronic lower back pain   . Disc herniation    neck and low back  . Family history of adverse reaction to anesthesia    "daughter w/PONV"   . Follicular cystitis   . Hiatal hernia   . High cholesterol   . Hyperlipidemia    ELEV CHOLESTEROL  . Hypothyroidism   . Osteopenia 2014   T score -1.6 FRAX 13%/1%  . PAF (paroxysmal atrial fibrillation) ([place]) 08/30/2017   Echo 2/19: EF 55-60, normal wall motion, grade 1 diastolic dysfunction, mild PI, trivial TR //  Nuc 3/19: no ischemia or scar, EF 80 // CHADS2-VASc: 3 // Eliquis 5 bid   . Schatzki's ring    Past Surgical History:  Procedure Laterality Date  . BREAST BIOPSY Bilateral 1997   "excisional bx both benign"  . BREAST CYST ASPIRATION Right ~ 1993  . CATARACT EXTRACTION W/ INTRAOCULAR LENS IMPLANT Left 2011  . CATARACT EXTRACTION W/ INTRAOCULAR LENS IMPLANT Right 12/20/2012   Dr. Haynes Bast, OD  . COLONOSCOPY WITH ESOPHAGOGASTRODUODENOSCOPY (EGD)  "several"   "? dilated esophagus" (08/30/2017)  . DILATION AND CURETTAGE OF UTERUS  1996  . EYE MUSCLE SURGERY Bilateral 1992  . HERNIA REPAIR    . HYSTEROSCOPY    . LAPAROSCOPIC CHOLECYSTECTOMY  09/28/2011  . OOPHORECTOMY Right 04/2000   LAP RSO/LYSIS OF ADHESIONS  . THYROIDECTOMY  1976  . TUBAL LIGATION  1976  . VARICOSE VEIN SURGERY Bilateral 1972  . VENTRAL HERNIA REPAIR  06/23/13   Dr. Franz Dell  Family History  Problem Relation Age of Onset  . Cancer Brother 82       melanoma  . Stroke Sister   . Hypertension Sister   . Arthritis Sister   . Lupus Sister   . Cancer Sister   . Breast cancer Sister        Age 68  . Hyperlipidemia Brother        deceased  . Hypertension Brother   . Arthritis Daughter        Rheumatoid  . Hyperparathyroidism Daughter   . Heart attack Father 45  . Heart disease Father   . Anuerysm Father    Social History   Socioeconomic History  . Marital status: Married    Spouse name: [name]  . Number of children: 1  . Years of education: Not on file  . Highest education level: Not on file  Occupational History  . Not on file  Social Needs  . Financial resource strain:  Not on file  . Food insecurity:    Worry: Not on file    Inability: Not on file  . Transportation needs:    Medical: Not on file    Non-medical: Not on file  Tobacco Use  . Smoking status: Former Smoker    Packs/day: 0.50    Years: 18.00    Pack years: 9.00    Types: Cigarettes    Last attempt to quit: 01/27/1979    Years since quitting: 39.4  . Smokeless tobacco: Never Used  Substance and Sexual Activity  . Alcohol use: Yes    Comment: 08/30/2017 "might have glass of wine twice/year"  . Drug use: No  . Sexual activity: Not on file  Lifestyle  . Physical activity:    Days per week: Not on file    Minutes per session: Not on file  . Stress: Not on file  Relationships  . Social connections:    Talks on phone: Not on file    Gets together: Not on file    Attends religious service: Not on file    Active member of club or organization: Not on file    Attends meetings of clubs or organizations: Not on file    Relationship status: Not on file  Other Topics Concern  . Not on file  Social History Narrative   4 brothers and 6 sisters. All 4 brother deceased.  2 sisters deceased.  No regular exercise. Still working.      Outpatient Encounter Medications as of 07/08/2018  Medication Sig  . apixaban (ELIQUIS) 5 MG TABS tablet Take 1 tablet (5 mg total) by mouth 2 (two) times daily.  [name] CALCIUM PO Take 1 tablet by mouth daily.   . Cholecalciferol (VITAMIN D) 2000 UNITS tablet Take 2,000 Units by mouth daily.    [name] estradiol (ESTRACE) 0.1 MG/GM vaginal cream Place 1.61 Applicatorfuls vaginally as needed. For vaginal irritation  . meloxicam (MOBIC) 15 MG tablet Take 1 tablet (15 mg total) by mouth daily as needed for pain.  . metoprolol succinate (TOPROL-XL) 50 MG 24 hr tablet Take 50 mg by mouth daily. Take with or immediately following a meal.  . Multiple Vitamin (MULTIVITAMIN) tablet Take 1 tablet by mouth daily.    [name] omeprazole (PRILOSEC) 40 MG capsule TAKE 1 CAPSULE (40 MG TOTAL) BY  MOUTH DAILY.  . simvastatin (ZOCOR) 40 MG tablet TAKE 1 TABLET BY MOUTH EVERY DAY  . SYNTHROID 75 MCG tablet Take 1 tablet (75 mcg total) by mouth daily  before breakfast.   No facility-administered encounter medications on file as of 07/08/2018.     Activities of Daily Living In your present state of health, do you have any difficulty performing the following activities: 08/30/2017  Hearing? N  Vision? N  Difficulty concentrating or making decisions? N  Walking or climbing stairs? Y  Comment "I avoid stairs due to hip pain"  Dressing or bathing? N  Doing errands, shopping? N  Some recent data might be hidden    Patient Care Team: [name], MD as PCP - General Nahser, Wonda Cheng, MD as PCP - Cardiology (Cardiology) Gwendel Hanson, MD as Referring Physician (Urology) Phineas Real Belinda Block, MD as Consulting Physician (Gynecology) Jyl Heinz, MD as Referring Physician (Endocrinology)    Assessment:   This is a routine wellness examination for [place].Physical assessment deferred to PCP.   Exercise Activities and Dietary recommendations   Diet  Breakfast: Lunch:  Dinner:       Goals   None     Fall Risk Fall Risk  04/22/2018 02/08/2017 02/04/2016 01/25/2015 10/17/2013  Falls in the past year? No No No No No   Is the patient's home free of loose throw rugs in walkways, pet beds, electrical cords, etc?   {Blank single:19197::"yes","no"}      Grab bars in the bathroom? {Blank single:19197::"yes","no"}      Handrails on the stairs?   {Blank single:19197::"yes","no"}      Adequate lighting?   {Blank single:19197::"yes","no"}   Depression Screen PHQ 2/9 Scores 04/22/2018 09/06/2017 02/08/2017 02/04/2016  PHQ - 2 Score 0 0 0 0  PHQ- 9 Score - 0 - -     Cognitive Function     6CIT Screen 02/08/2017  What Year? 0 points  What month? 0 points  What time? 0 points  Count back from 20 0 points  Months in reverse 0 points  Repeat phrase 0 points   Total Score 0    Immunization History  Administered Date(s) Administered  . Influenza Split 06/02/2011, 05/03/2012  . Influenza Whole 04/16/2009  . Influenza,inj,Quad PF,6+ Mos 05/01/2013, 04/17/2014  . Pneumococcal Conjugate-13 10/17/2013  . Pneumococcal Polysaccharide-23 02/04/2016  . Td 01/21/2010  . Zoster 08/18/2011    Screening Tests Health Maintenance  Topic Date Due  . INFLUENZA VACCINE  01/24/2018  . DEXA SCAN  05/30/2018  . TETANUS/TDAP  01/22/2020  . MAMMOGRAM  02/14/2020  . COLONOSCOPY  02/09/2023  . Hepatitis C Screening  Completed  . PNA vac Low Risk Adult  Completed        Plan:   ***   I have personally reviewed and noted the following in the patient's chart:   . Medical and social history . Use of alcohol, tobacco or illicit drugs  . Current medications and supplements . Functional ability and status . Nutritional status . Physical activity . Advanced directives . List of other physicians . Hospitalizations, surgeries, and ER visits in previous 12 months . Vitals . Screenings to include cognitive, depression, and falls . Referrals and appointments  In addition, I have reviewed and discussed with patient certain preventive protocols, quality metrics, and best practice recommendations. A written personalized care plan for preventive services as well as general preventive health recommendations were provided to patient.     Joanne Chars, LPN  03/02/4162

## 2018-07-08 ENCOUNTER — Ambulatory Visit: Payer: Medicare Other

## 2018-07-19 ENCOUNTER — Telehealth: Payer: Self-pay | Admitting: Family Medicine

## 2018-07-19 NOTE — Telephone Encounter (Signed)
These call patient and let her know that she should not be taking meloxicam with Eliquis.  This significantly really increases her risk of a bleed from her GI tract.  If she needs to use something for pain she should use Tylenol.

## 2018-07-22 ENCOUNTER — Telehealth: Payer: Self-pay | Admitting: Cardiovascular Disease

## 2018-07-22 NOTE — Telephone Encounter (Signed)
Pt advised of recommendation. She fears the pain will get bad. She will try the Tylenol and call us back if the pain gets too bad.   She does report she saw the ENT for the tinnitus. States her neck pain is "better" but she is follow with specialist for the tinnitus.

## 2018-07-22 NOTE — Telephone Encounter (Signed)
New Message   Pt c/o medication issue:  1. Name of Medication: Eliquis, Meloxicam  2. How are you currently taking this medication (dosage and times per day)? Eliquis 5mg , Meloxicam 15mg   3. Are you having a reaction (difficulty breathing--STAT)? No   4. What is your medication issue? PC Dr Madilyn Fireman told her that she should not be taking both of these medications together. She told the PT to stop taking the Meloxicam but the PT thinks she will be in more pain She wants to know what Dr Acie Fredrickson thinks about this Please call

## 2018-07-22 NOTE — Telephone Encounter (Signed)
Spoke with patient who called with questions regarding taking Eliquis and Meloxicam together. She states Dr. Gardiner Ramus office called her and advised her to stop the Meloxicam due to the interaction between these 2 medications. She states she has been on Meloxicam many years and started Eliquis last March due to new onset a fib; she asks why this was not mentioned at her last ov with Dr. Acie Fredrickson. I advised that while these medications can cause increase risk of bleeding when taken together we typically advise patients to try to limit use of all NSAIDs as much as possible. She states Dr. Madilyn Fireman advised her to take Tylenol instead. I advised her to call back to Dr. Gardiner Ramus office and ask for other alternatives, such as Robaxin. Patient verbalized understanding and agreement and thanked me for the call.

## 2018-08-20 ENCOUNTER — Ambulatory Visit (INDEPENDENT_AMBULATORY_CARE_PROVIDER_SITE_OTHER): Payer: Medicare Other | Admitting: Family Medicine

## 2018-08-20 ENCOUNTER — Encounter: Payer: Self-pay | Admitting: Family Medicine

## 2018-08-20 VITALS — BP 132/67 | HR 99 | Ht 65.0 in | Wt 176.0 lb

## 2018-08-20 DIAGNOSIS — I480 Paroxysmal atrial fibrillation: Secondary | ICD-10-CM | POA: Diagnosis not present

## 2018-08-20 DIAGNOSIS — M2550 Pain in unspecified joint: Secondary | ICD-10-CM | POA: Diagnosis not present

## 2018-08-20 DIAGNOSIS — E039 Hypothyroidism, unspecified: Secondary | ICD-10-CM | POA: Diagnosis not present

## 2018-08-20 DIAGNOSIS — H9311 Tinnitus, right ear: Secondary | ICD-10-CM | POA: Diagnosis not present

## 2018-08-20 MED ORDER — DULOXETINE HCL 30 MG PO CPEP: 30.0000 mg | ORAL_CAPSULE | Freq: Every day | ORAL | 3 refills | Status: DC

## 2018-08-20 NOTE — Progress Notes (Signed)
Subjective:    CC:   HPI:  68-year-old female is here today to discuss a couple of concerns.  Hypothyroidism - Taking medication regularly in the AM away from food and vitamins, etc. No recent change to skin, hair, or energy levels.  It is been recommended that she have her thyroid checked every 6 months.  Right now she is doing well and taking her medication consistently she is on branded Synthroid.  She was previously following with her endocrinologist Dr. Kalman Shan.  Ear ringing-she complained about ear ringing particularly in the right ear compared to the left when I last saw her we ended up referring her to ENT for further evaluation.  They did not seek find any specific causes for the ear ringing and recommended that she follow-up in 1 year.  She had some questions regarding this.  She also complains of more diffuse joint pain.  She points to her knees hips back etc.  Previously she was using meloxicam pretty regularly to help with arthritis pain.  She says it actually worked really well for her but when she came in at her last visit we realized that she was taking it with the Eliquis so I had her stop it and switch to Tylenol.  She does get some temporary relief for maybe a couple hours with Tylenol but then it wears off and she feels like the pain is starting to affect her day-to-day routine.  Atrial Fibrillation-all she is doing well.  She says the A. fib started around the time that her thyroid medication was increased.  She started noticing palpitations, heart racing and increased anxiety levels.  It has been titrated back down since then and she is doing better she is on Eliquis.  BP 132/67   Pulse 99   Ht 5\' 5"  (1.651 m)   Wt 176 lb (79.8 kg)   LMP 09/08/2000   SpO2 98%   BMI 29.29 kg/m     Allergies  Allergen Reactions  . Amoxicillin-Pot Clavulanate     REACTION: blisters in colon  . Amoxicillin-Pot Clavulanate Other (See Comments)    Colon blisters Blistered colon  .  Ceftriaxone Sodium     REACTION: throat swells (ROCEPHIN)  . Codeine     REACTION: nausea  . Molds & Smuts   . Zofran [Ondansetron Hcl]     Headaches    Past Medical History:  Diagnosis Date  . Acid reflux   . Aortic atherosclerosis ([place]) 04/28/2013   CT 08/2017  . Arthritis    "joints, hands" (08/30/2017)  . Chronic lower back pain   . Disc herniation    neck and low back  . Family history of adverse reaction to anesthesia    "daughter w/PONV"  . Follicular cystitis   . Hiatal hernia   . High cholesterol   . Hyperlipidemia    ELEV CHOLESTEROL  . Hypothyroidism   . Osteopenia 2014   T score -1.6 FRAX 13%/1%  . PAF (paroxysmal atrial fibrillation) ([place]) 08/30/2017   Echo 2/19: EF 55-60, normal wall motion, grade 1 diastolic dysfunction, mild PI, trivial TR //  Nuc 3/19: no ischemia or scar, EF 80 // CHADS2-VASc: 3 // Eliquis 5 bid   . Schatzki's ring     Past Surgical History:  Procedure Laterality Date  . BREAST BIOPSY Bilateral 1997   "excisional bx both benign"  . BREAST CYST ASPIRATION Right ~ 1993  . CATARACT EXTRACTION W/ INTRAOCULAR LENS IMPLANT Left 2011  . CATARACT EXTRACTION W/  INTRAOCULAR LENS IMPLANT Right 12/20/2012   Dr. Haynes Bast, OD  . COLONOSCOPY WITH ESOPHAGOGASTRODUODENOSCOPY (EGD)  "several"   "? dilated esophagus" (08/30/2017)  . DILATION AND CURETTAGE OF UTERUS  1996  . EYE MUSCLE SURGERY Bilateral 1992  . HERNIA REPAIR    . HYSTEROSCOPY    . LAPAROSCOPIC CHOLECYSTECTOMY  09/28/2011  . OOPHORECTOMY Right 04/2000   LAP RSO/LYSIS OF ADHESIONS  . THYROIDECTOMY  1976  . TUBAL LIGATION  1976  . VARICOSE VEIN SURGERY Bilateral 1972  . VENTRAL HERNIA REPAIR  06/23/13   Dr. Franz Dell    Social History   Socioeconomic History  . Marital status: Married    Spouse name: [name]  . Number of children: 1  . Years of education: Not on file  . Highest education level: Not on file  Occupational History  . Not on file  Social Needs  . Financial  resource strain: Not on file  . Food insecurity:    Worry: Not on file    Inability: Not on file  . Transportation needs:    Medical: Not on file    Non-medical: Not on file  Tobacco Use  . Smoking status: Former Smoker    Packs/day: 0.50    Years: 18.00    Pack years: 9.00    Types: Cigarettes    Last attempt to quit: 01/27/1979    Years since quitting: 39.5  . Smokeless tobacco: Never Used  Substance and Sexual Activity  . Alcohol use: Yes    Comment: 08/30/2017 "might have glass of wine twice/year"  . Drug use: No  . Sexual activity: Not on file  Lifestyle  . Physical activity:    Days per week: Not on file    Minutes per session: Not on file  . Stress: Not on file  Relationships  . Social connections:    Talks on phone: Not on file    Gets together: Not on file    Attends religious service: Not on file    Active member of club or organization: Not on file    Attends meetings of clubs or organizations: Not on file    Relationship status: Not on file  . Intimate partner violence:    Fear of current or ex partner: Not on file    Emotionally abused: Not on file    Physically abused: Not on file    Forced sexual activity: Not on file  Other Topics Concern  . Not on file  Social History Narrative   4 brothers and 6 sisters. All 4 brother deceased.  2 sisters deceased.  No regular exercise. Still working.      Family History  Problem Relation Age of Onset  . Cancer Brother 6       melanoma  . Stroke Sister   . Hypertension Sister   . Arthritis Sister   . Lupus Sister   . Cancer Sister   . Breast cancer Sister        Age 27  . Hyperlipidemia Brother        deceased  . Hypertension Brother   . Arthritis Daughter        Rheumatoid  . Hyperparathyroidism Daughter   . Heart attack Father 28  . Heart disease Father   . Anuerysm Father     Outpatient Encounter Medications as of 08/20/2018  Medication Sig  . apixaban (ELIQUIS) 5 MG TABS tablet Take 1 tablet (5 mg  total) by mouth 2 (two) times daily.  [name]  CALCIUM PO Take 1 tablet by mouth daily.   . Cholecalciferol (VITAMIN D) 2000 UNITS tablet Take 2,000 Units by mouth daily.    . DULoxetine (CYMBALTA) 30 MG capsule Take 1 capsule (30 mg total) by mouth daily.  [name] estradiol (ESTRACE) 0.1 MG/GM vaginal cream Place 4.94 Applicatorfuls vaginally as needed. For vaginal irritation  . metoprolol succinate (TOPROL-XL) 50 MG 24 hr tablet Take 50 mg by mouth daily. Take with or immediately following a meal.  . Multiple Vitamin (MULTIVITAMIN) tablet Take 1 tablet by mouth daily.    [name] omeprazole (PRILOSEC) 40 MG capsule TAKE 1 CAPSULE (40 MG TOTAL) BY MOUTH DAILY.  . simvastatin (ZOCOR) 40 MG tablet TAKE 1 TABLET BY MOUTH EVERY DAY  . SYNTHROID 75 MCG tablet Take 1 tablet (75 mcg total) by mouth daily before breakfast.  . [DISCONTINUED] meloxicam (MOBIC) 15 MG tablet Take 1 tablet (15 mg total) by mouth daily as needed for pain.   No facility-administered encounter medications on file as of 08/20/2018.        Objective:    General: Well Developed, well nourished, and in no acute distress.  Neuro: Alert and oriented x3, extra-ocular muscles intact, sensation grossly intact.  HEENT: Normocephalic, atraumatic  Skin: Warm and dry, no rashes. Cardiac: Regular rate and rhythm, no murmurs rubs or gallops, no lower extremity edema.  Respiratory: Clear to auscultation bilaterally. Not using accessory muscles, speaking in full sentences.   Impression and Recommendations:    Thyroidism-doing well overall.  Due to recheck thyroid level and can adjust dose if needed.  She did note that in the past she has been very sensitive to even mild changes in her hormone levels.  Atrial fibrillation-continue current regimen.  Currently on Eliquis.  Recommendation to avoid all NSAIDs at this point in time.  Polyarthritis-we did discuss options she feels like there are a lot of there is a lot of inflammation in her joints so we  discussed doing some additional lab work just to rule out autoimmune type arthritis.  Though I suspect it is likely osteo-.  We also discussed possibly trying the Tylenol arthritis which is basically an extended release version of Tylenol and possibly even trying Cymbalta which in studies has been shown to reduce chronic musculoskeletal pain by about 30%.  She says she is open to trying the Cymbalta so we will send over new prescription to the pharmacy.  I will see her back in 6 to 8 weeks to see how she is doing on it.  Tinnitus-explained that 90% of the time there is no specific cause for tinnitus and there is no treatment or cure.  At this point encouraged her to continue to monitor and certainly if symptoms worsen to please give [place] a call back and at that point happy to get her back in with ENT.  Otherwise I would encourage her to keep her yearly follow-up with ENT.

## 2018-08-22 LAB — URIC ACID: Uric Acid, Serum: 4.2 mg/dL (ref 2.5–7.0)

## 2018-08-22 LAB — CYCLIC CITRUL PEPTIDE ANTIBODY, IGG: Cyclic Citrullin Peptide Ab: 128 UNITS — ABNORMAL HIGH

## 2018-08-22 LAB — COMPLETE METABOLIC PANEL WITH GFR
AG Ratio: 1.9 (calc) (ref 1.0–2.5)
ALKALINE PHOSPHATASE (APISO): 68 U/L (ref 37–153)
ALT: 21 U/L (ref 6–29)
AST: 19 U/L (ref 10–35)
Albumin: 4.6 g/dL (ref 3.6–5.1)
BUN: 8 mg/dL (ref 7–25)
CHLORIDE: 104 mmol/L (ref 98–110)
CO2: 29 mmol/L (ref 20–32)
CREATININE: 0.9 mg/dL (ref 0.50–0.99)
Calcium: 9.8 mg/dL (ref 8.6–10.4)
GFR, Est African American: 76 mL/min/{1.73_m2} (ref >=60)
GFR, Est Non African American: 65 mL/min/{1.73_m2} (ref >=60)
GLUCOSE: 118 mg/dL — AB (ref 65–99)
Globulin: 2.4 g/dL (calc) (ref 1.9–3.7)
Potassium: 4.1 mmol/L (ref 3.5–5.3)
SODIUM: 141 mmol/L (ref 135–146)
Total Bilirubin: 0.7 mg/dL (ref 0.2–1.2)
Total Protein: 7 g/dL (ref 6.1–8.1)

## 2018-08-22 LAB — ANTI-NUCLEAR AB-TITER (ANA TITER)
ANA TITER: 1:40 {titer} — ABNORMAL HIGH
ANA Titer 1: 1:80 {titer} — ABNORMAL HIGH

## 2018-08-22 LAB — LIPID PANEL W/REFLEX DIRECT LDL
CHOL/HDL RATIO: 3.9 (calc) (ref <5.0)
Cholesterol: 185 mg/dL (ref <200)
HDL: 48 mg/dL — AB (ref >=50)
LDL CHOLESTEROL (CALC): 114 mg/dL — AB
NON-HDL CHOLESTEROL (CALC): 137 mg/dL — AB (ref <130)
Triglycerides: 120 mg/dL (ref <150)

## 2018-08-22 LAB — TSH+FREE T4: TSH W/REFLEX TO FT4: 2.35 m[IU]/L (ref 0.40–4.50)

## 2018-08-22 LAB — ANA: Anti Nuclear Antibody(ANA): POSITIVE — AB

## 2018-08-22 LAB — RHEUMATOID FACTOR

## 2018-08-22 LAB — SEDIMENTATION RATE: Sed Rate: 11 mm/h (ref 0–30)

## 2018-08-26 ENCOUNTER — Encounter: Payer: Self-pay | Admitting: Family Medicine

## 2018-08-26 DIAGNOSIS — R768 Other specified abnormal immunological findings in serum: Secondary | ICD-10-CM

## 2018-09-02 NOTE — Progress Notes (Signed)
Office Visit Note  Patient: Anne [name]             Date of Birth: [birth year]           MRN: [id number]             PCP: [name], MD Referring: [name], * Visit Date: 09/10/2018 Occupation: retired, Teaching laboratory technician  Subjective:  Pain in multiple joints.   History of Present Illness: Anne [name] is a 68 y.o. female seen in consultation per request of her PCP.  According to patient she has had history of disc disease of lumbar spine with spinal stenosis for many years.  She states she was under care of orthopedics in [place] but did not require any surgery or treatment.  She has been experiencing neck pain for the last 2 to 3 months.  She states Dr. Madilyn Fireman did x-ray of her cervical spine which was consistent with disc disease of cervical spine.  For the last few months she has been experiencing Grapey pain and discomfort in her hands especially over the [facility] joints.  She has difficulty opening bottles.  She states her hands are stiff in the morning and feels swollen but they get better after running warm water on her hands.  She also has right second and third trigger finger.  She has left third and fourth trigger finger.  She has discomfort in her knee joints without any warmth swelling or effusion.  There is positive family history of rheumatoid arthritis in her sister and her daughter.  She denies discomfort in any other joints.  Activities of Daily Living:  Patient reports morning stiffness for 10 minutes.   Patient Reports nocturnal pain.  Difficulty dressing/grooming: Denies Difficulty climbing stairs: Reports Difficulty getting out of chair: Denies Difficulty using hands for taps, buttons, cutlery, and/or writing: Reports  Review of Systems  Constitutional: Negative for fatigue, night sweats, weight gain and weight loss.  HENT: Positive for mouth dryness. Negative for mouth sores, trouble swallowing, trouble swallowing and nose dryness.     Eyes: Positive for dryness. Negative for pain, redness and visual disturbance.  Respiratory: Negative for cough, shortness of breath and difficulty breathing.   Cardiovascular: Positive for palpitations. Negative for chest pain, hypertension, irregular heartbeat and swelling in legs/feet.  Gastrointestinal: Negative for blood in stool, constipation and diarrhea.  Endocrine: Negative for increased urination.  Genitourinary: Negative for vaginal dryness.  Musculoskeletal: Positive for arthralgias, joint pain, joint swelling and morning stiffness. Negative for myalgias, muscle weakness, muscle tenderness and myalgias.  Skin: Negative for color change, rash, hair loss, skin tightness, ulcers and sensitivity to sunlight.  Allergic/Immunologic: Negative for susceptible to infections.  Neurological: Negative for dizziness, memory loss, night sweats and weakness.  Hematological: Negative for swollen glands.  Psychiatric/Behavioral: Negative for depressed mood and sleep disturbance. The patient is not nervous/anxious.     PMFS History:  Patient Active Problem List   Diagnosis Date Noted  . Essential hypertension 12/25/2017  . GERD (gastroesophageal reflux disease) 08/30/2017  . Chronic diastolic (congestive) heart failure ([place]) 08/30/2017  . Paroxysmal atrial fibrillation ([place]) 08/30/2017  . Flushing 07/30/2017  . Paresthesia of both hands 07/30/2017  . 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 Aortic atherosclerosis ([place]) 04/28/2013  . Schatzki's ring 10/16/2012  . Spinal stenosis of lumbar region 08/16/2012  . Vaginal atrophy 11/14/2011  . Disc herniation   . Osteopenia   . PERSONAL HISTORY OF ALLERGY TO LATEX 12/23/2009  . UNSPECIFIED DISEASE OF PHARYNX  12/16/2008  . FATIGUE 11/10/2008  . Intermittent palpitations 11/10/2008  . Hypothyroidism 09/22/2008  . Hyperlipidemia 09/22/2008  . ALLERGIC RHINITIS 09/22/2008    Past Medical History:  Diagnosis Date  . Acid reflux   . Aortic  atherosclerosis ([place]) 04/28/2013   CT 08/2017  . Arthritis    "joints, hands" (08/30/2017)  . Chronic lower back pain   . Disc herniation    neck and low back  . Family history of adverse reaction to anesthesia    "daughter w/PONV"  . Follicular cystitis   . Hiatal hernia   . High cholesterol   . Hyperlipidemia    ELEV CHOLESTEROL  . Hypothyroidism   . Osteopenia 2014   T score -1.6 FRAX 13%/1%  . PAF (paroxysmal atrial fibrillation) ([place]) 08/30/2017   Echo 2/19: EF 55-60, normal wall motion, grade 1 diastolic dysfunction, mild PI, trivial TR //  Nuc 3/19: no ischemia or scar, EF 80 // CHADS2-VASc: 3 // Eliquis 5 bid   . Schatzki's ring     Family History  Problem Relation Age of Onset  . Cancer Brother 9       melanoma  . Melanoma Brother   . Prostate cancer Brother   . Leukemia Brother   . Pulmonary fibrosis Brother   . Stroke Sister   . Hypertension Sister   . Arthritis Sister   . Lupus Sister   . Cancer Sister   . Breast cancer Sister        Age 47  . Rheum arthritis Sister   . Arthritis Daughter        Rheumatoid  . Hyperparathyroidism Daughter   . Heart attack Father 28  . Heart disease Father   . Anuerysm Father   . Hypertension Mother    Past Surgical History:  Procedure Laterality Date  . BREAST BIOPSY Bilateral 1997   "excisional bx both benign"  . BREAST CYST ASPIRATION Right ~ 1993  . CATARACT EXTRACTION W/ INTRAOCULAR LENS IMPLANT Left 2011  . CATARACT EXTRACTION W/ INTRAOCULAR LENS IMPLANT Right 12/20/2012   Dr. Haynes Bast, OD  . COLONOSCOPY WITH ESOPHAGOGASTRODUODENOSCOPY (EGD)  "several"   "? dilated esophagus" (08/30/2017)  . DILATION AND CURETTAGE OF UTERUS  1996  . EYE MUSCLE SURGERY Bilateral 1992  . HERNIA REPAIR    . HYSTEROSCOPY    . LAPAROSCOPIC CHOLECYSTECTOMY  09/28/2011  . OOPHORECTOMY Right 04/2000   LAP RSO/LYSIS OF ADHESIONS  . THYROIDECTOMY  1976  . TUBAL LIGATION  1976  . VARICOSE VEIN SURGERY Bilateral 1972  . VENTRAL  HERNIA REPAIR  06/23/13   Dr. Franz Dell   Social History   Social History Narrative   4 brothers and 6 sisters. All 4 brother deceased.  2 sisters deceased.  No regular exercise. Still working.     Immunization History  Administered Date(s) Administered  . Influenza Split 06/02/2011, 05/03/2012  . Influenza Whole 04/16/2009  . Influenza,inj,Quad PF,6+ Mos 05/01/2013, 04/17/2014  . Pneumococcal Conjugate-13 10/17/2013  . Pneumococcal Polysaccharide-23 02/04/2016  . Td 01/21/2010  . Zoster 08/18/2011     Objective: Vital Signs: BP 120/72 (BP Location: Right Arm, Patient Position: Sitting, Cuff Size: Normal)   Pulse 81   Resp 14   Ht 5\' 4"  (1.626 m)   Wt 178 lb 6.4 oz (80.9 kg)   LMP 09/08/2000   BMI 30.62 kg/m    Physical Exam Vitals signs and nursing note reviewed.  Constitutional:      Appearance: She is well-developed.  HENT:  Head: Normocephalic and atraumatic.  Eyes:     Conjunctiva/sclera: Conjunctivae normal.  Neck:     Musculoskeletal: Normal range of motion.  Cardiovascular:     Rate and Rhythm: Normal rate and regular rhythm.     Heart sounds: Normal heart sounds.  Pulmonary:     Effort: Pulmonary effort is normal.     Breath sounds: Normal breath sounds.  Abdominal:     General: Bowel sounds are normal.     Palpations: Abdomen is soft.  Lymphadenopathy:     Cervical: No cervical adenopathy.  Skin:    General: Skin is warm and dry.     Capillary Refill: Capillary refill takes less than 2 seconds.  Neurological:     Mental Status: She is alert and oriented to person, place, and time.  Psychiatric:        Behavior: Behavior normal.      Musculoskeletal Exam: Patient has some stiffness with range of motion of her cervical spine.  She has good range of motion of her thoracic spine.  She has limitation of range of motion of her lumbar spine.  Shoulder joints elbow joints with good range of motion.  She has PIP and DIP thickening and CMC thickening.   Hip joints are in good range of motion.  She has no warmth swelling or effusion in her knee joints but some crepitus with range of motion.  No MTP PIP or DIP thickening or swelling was noted.  CDAI Exam: CDAI Score: Not documented Patient Global Assessment: Not documented; Provider Global Assessment: Not documented Swollen: Not documented; Tender: Not documented Joint Exam   Not documented   There is currently no information documented on the homunculus. Go to the Rheumatology activity and complete the homunculus joint exam.  Investigation: Findings:  08/20/18: ANA 1:80 cytoplasmic, 1:40 NH, TSH 2.35, RF<14, Sed rate 11, CCP 128, uric acid 4.2  Component     Latest Ref Rng & Units 08/20/2018  ANA Titer 1     titer 1:80 (H)  ANA Pattern 1      Cytoplasmic (A)  ANA TITER     titer 1:40 (H)  ANA PATTERN      Nuclear, Homogeneous (A)  TSH W/REFLEX TO FT4     0.40 - 4.50 mIU/L 2.35  Anti Nuclear Antibody(ANA)     NEGATIVE POSITIVE (A)  RA Latex Turbid.     <14 IU/mL <14  Sed Rate     0 - 30 mm/h 11  Cyclic Citrullin Peptide Ab     UNITS 128 (H)  Uric Acid, Serum     2.5 - 7.0 mg/dL 4.2   Imaging: Xr Hand 2 View Left  Result Date: 09/10/2018 PIP and DIP narrowing was noted.  Severe CMC narrowing was noted.  No MCP intercarpal or radiocarpal joint space narrowing was noted.  No erosive changes were noted. Impression: These findings are consistent with osteoarthritis of the hand.  Xr Hand 2 View Right  Result Date: 09/10/2018 CMC, PIP and DIP narrowing was noted.  First DIP subluxation was noted.  No MCP intercarpal radiocarpal joint space narrowing was noted.  No erosive changes were noted. Impression: These findings are consistent with osteoarthritis of the hand.  Xr Knee 3 View Left  Result Date: 09/10/2018 Mild medial compartment narrowing was noted.  Moderate patellofemoral narrowing was noted.  No chondrocalcinosis was noted. Impression: These findings are consistent  with mild osteoarthritis and moderate chondromalacia patella.  Xr Knee 3 View Right  Result  Date: 09/10/2018 Mild medial compartment narrowing was noted.  Moderate patellofemoral narrowing was noted.  No chondrocalcinosis was noted. Impression: These findings are consistent with mild osteoarthritis and moderate chondromalacia patella.   Recent Labs: Lab Results  Component Value Date   WBC 5.7 09/25/2017   HGB 14.2 09/25/2017   PLT 194 09/25/2017   NA 141 08/20/2018   K 4.1 08/20/2018   CL 104 08/20/2018   CO2 29 08/20/2018   GLUCOSE 118 (H) 08/20/2018   BUN 8 08/20/2018   CREATININE 0.90 08/20/2018   BILITOT 0.7 08/20/2018   ALKPHOS 81 08/30/2017   AST 19 08/20/2018   ALT 21 08/20/2018   PROT 7.0 08/20/2018   ALBUMIN 4.2 08/30/2017   CALCIUM 9.8 08/20/2018   GFRAA 76 08/20/2018    Speciality Comments: No specialty comments available.  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  Patient is a significantly high anti-CCP.  She also has positive family history of rheumatoid arthritis.  Significance of anti-CCP and relationship with the rheumatoid arthritis was discussed.  I also discussed that anti-CCP could be present several years prior to onset of rheumatoid arthritis.  It may also be present in the population who never develop rheumatoid arthritis.  So she should have anti-CCP with the gingivitis and smoking was also discussed.  Patient is a former smoker.  He also has low titer titer ANA which is not significant.  She has no clinical features of lupus.  Chronic pain of both knees -she complains of discomfort in her bilateral knee joints.  No warmth swelling or effusion was noted.   Plan: XR KNEE 3 VIEW RIGHT, XR KNEE 3 VIEW LEFT.  X-ray showed mild osteoarthritis and moderate chondromalacia patella.  A handout on knee joint exercises was given.  Pain in both hands -she complains of a stiffness and discomfort in her bilateral hands.  She believes that she has some swelling intermittently in her hands.  No warmth swelling or effusion was noted.  DIP PIP and CMC thickening was noted which was consistent with osteoarthritis.  Her sed rate was normal.  Detailed counseling osteoarthritis was provided.  A handout on hand exercises was given.  Plan: XR Hand 2 View Right, XR Hand 2 View Left.  The x-ray of bilateral hands were consistent with osteoarthritis.  Trigger finger-patient also gives history of right second and third and left third and fourth trigger finger.  It is currently not very symptomatic.  If it becomes symptomatic she may consider cortisone injection.  DDD (degenerative disc disease), cervical-I reviewed x-rays of her cervical spine which were consistent with disc disease of cervical spine.  Spinal stenosis of lumbar region, unspecified whether neurogenic claudication present-patient gives longstanding history of disc disease of lumbar spine.  She is not having any radiculopathy currently.  Chronic diastolic (congestive) heart failure (HCC)  Paroxysmal atrial fibrillation ([facility])-she is on Eliquis.  Essential hypertension-blood pressure is well controlled.  Aortic atherosclerosis ([facility])  Schatzki's ring  Postsurgical hypothyroidism  History of gastroesophageal reflux (GERD)  History of hyperlipidemia  Family history of rheumatoid arthritis - Sister and daughter   Orders: Orders Placed This Encounter  Procedures  . XR Hand 2 View Right  . XR Hand 2 View Left  .  XR KNEE 3 VIEW RIGHT  . XR KNEE 3 VIEW LEFT   No orders of the defined types were placed in this encounter.   Face-to-face time spent with patient was 50 minutes. Greater than 50% of time was  spent in counseling and coordination of care.  Follow-Up Instructions: Return in about 6 months (around 03/13/2019) for Osteoarthritis, +anti-CCP.   Bo Merino, MD  Note - This record has been created using Editor, commissioning.  Chart creation errors have been sought, but may not always  have been located. Such creation errors do not reflect on  the standard of medical care.

## 2018-09-10 ENCOUNTER — Ambulatory Visit (INDEPENDENT_AMBULATORY_CARE_PROVIDER_SITE_OTHER): Payer: Medicare Other

## 2018-09-10 ENCOUNTER — Other Ambulatory Visit: Payer: Self-pay

## 2018-09-10 ENCOUNTER — Ambulatory Visit (INDEPENDENT_AMBULATORY_CARE_PROVIDER_SITE_OTHER): Payer: Medicare Other | Admitting: Rheumatology

## 2018-09-10 ENCOUNTER — Encounter: Payer: Self-pay | Admitting: Rheumatology

## 2018-09-10 VITALS — BP 120/72 | HR 81 | Resp 14 | Ht 64.0 in | Wt 178.4 lb

## 2018-09-10 DIAGNOSIS — M25561 Pain in right knee: Secondary | ICD-10-CM

## 2018-09-10 DIAGNOSIS — I480 Paroxysmal atrial fibrillation: Secondary | ICD-10-CM

## 2018-09-10 DIAGNOSIS — I110 Hypertensive heart disease with heart failure: Secondary | ICD-10-CM | POA: Diagnosis not present

## 2018-09-10 DIAGNOSIS — M5030 Other cervical disc degeneration, unspecified cervical region: Secondary | ICD-10-CM

## 2018-09-10 DIAGNOSIS — M2242 Chondromalacia patellae, left knee: Secondary | ICD-10-CM | POA: Diagnosis not present

## 2018-09-10 DIAGNOSIS — M48061 Spinal stenosis, lumbar region without neurogenic claudication: Secondary | ICD-10-CM

## 2018-09-10 DIAGNOSIS — Z87891 Personal history of nicotine dependence: Secondary | ICD-10-CM

## 2018-09-10 DIAGNOSIS — G8929 Other chronic pain: Secondary | ICD-10-CM

## 2018-09-10 DIAGNOSIS — M2241 Chondromalacia patellae, right knee: Secondary | ICD-10-CM | POA: Diagnosis not present

## 2018-09-10 DIAGNOSIS — M25562 Pain in left knee: Secondary | ICD-10-CM

## 2018-09-10 DIAGNOSIS — M79642 Pain in left hand: Secondary | ICD-10-CM | POA: Diagnosis not present

## 2018-09-10 DIAGNOSIS — I700 Atherosclerosis of aorta: Secondary | ICD-10-CM

## 2018-09-10 DIAGNOSIS — Z8639 Personal history of other endocrine, nutritional and metabolic disease: Secondary | ICD-10-CM

## 2018-09-10 DIAGNOSIS — R768 Other specified abnormal immunological findings in serum: Secondary | ICD-10-CM

## 2018-09-10 DIAGNOSIS — M1712 Unilateral primary osteoarthritis, left knee: Secondary | ICD-10-CM | POA: Diagnosis not present

## 2018-09-10 DIAGNOSIS — M1711 Unilateral primary osteoarthritis, right knee: Secondary | ICD-10-CM

## 2018-09-10 DIAGNOSIS — M79641 Pain in right hand: Secondary | ICD-10-CM

## 2018-09-10 DIAGNOSIS — I5032 Chronic diastolic (congestive) heart failure: Secondary | ICD-10-CM

## 2018-09-10 DIAGNOSIS — Z8719 Personal history of other diseases of the digestive system: Secondary | ICD-10-CM

## 2018-09-10 DIAGNOSIS — M19042 Primary osteoarthritis, left hand: Secondary | ICD-10-CM

## 2018-09-10 DIAGNOSIS — K222 Esophageal obstruction: Secondary | ICD-10-CM | POA: Diagnosis not present

## 2018-09-10 DIAGNOSIS — M19041 Primary osteoarthritis, right hand: Secondary | ICD-10-CM | POA: Diagnosis not present

## 2018-09-10 DIAGNOSIS — E890 Postprocedural hypothyroidism: Secondary | ICD-10-CM

## 2018-09-10 DIAGNOSIS — I10 Essential (primary) hypertension: Secondary | ICD-10-CM

## 2018-09-10 DIAGNOSIS — Z8261 Family history of arthritis: Secondary | ICD-10-CM

## 2018-09-10 NOTE — Patient Instructions (Signed)
Knee Exercises Ask your health care provider which exercises are safe for you. Do exercises exactly as told by your health care provider and adjust them as directed. It is normal to feel mild stretching, pulling, tightness, or discomfort as you do these exercises, but you should stop right away if you feel sudden pain or your pain gets worse.Do not begin these exercises until told by your health care provider. STRETCHING AND RANGE OF MOTION EXERCISES These exercises warm up your muscles and joints and improve the movement and flexibility of your knee. These exercises also help to relieve pain, numbness, and tingling. Exercise A: Knee Extension, Prone 1. Lie on your abdomen on a bed. 2. Place your left / right knee just beyond the edge of the surface so your knee is not on the bed. You can put a towel under your left / right thigh just above your knee for comfort. 3. Relax your leg muscles and allow gravity to straighten your knee. You should feel a stretch behind your left / right knee. 4. Hold this position for __________ seconds. 5. Scoot up so your knee is supported between repetitions. Repeat __________ times. Complete this stretch __________ times a day. Exercise B: Knee Flexion, Active  1. Lie on your back with both knees straight. If this causes back discomfort, bend your left / right knee so your foot is flat on the floor. 2. Slowly slide your left / right heel back toward your buttocks until you feel a gentle stretch in the front of your knee or thigh. 3. Hold this position for __________ seconds. 4. Slowly slide your left / right heel back to the starting position. Repeat __________ times. Complete this exercise __________ times a day. Exercise C: Quadriceps, Prone  1. Lie on your abdomen on a firm surface, such as a bed or padded floor. 2. Bend your left / right knee and hold your ankle. If you cannot reach your ankle or pant leg, loop a belt around your foot and grab the belt  instead. 3. Gently pull your heel toward your buttocks. Your knee should not slide out to the side. You should feel a stretch in the front of your thigh and knee. 4. Hold this position for __________ seconds. Repeat __________ times. Complete this stretch __________ times a day. Exercise D: Hamstring, Supine 1. Lie on your back. 2. Loop a belt or towel over the ball of your left / right foot. The ball of your foot is on the walking surface, right under your toes. 3. Straighten your left / right knee and slowly pull on the belt to raise your leg until you feel a gentle stretch behind your knee. ? Do not let your left / right knee bend while you do this. ? Keep your other leg flat on the floor. 4. Hold this position for __________ seconds. Repeat __________ times. Complete this stretch __________ times a day. STRENGTHENING EXERCISES These exercises build strength and endurance in your knee. Endurance is the ability to use your muscles for a long time, even after they get tired. Exercise E: Quadriceps, Isometric  1. Lie on your back with your left / right leg extended and your other knee bent. Put a rolled towel or small pillow under your knee if told by your health care provider. 2. Slowly tense the muscles in the front of your left / right thigh. You should see your kneecap slide up toward your hip or see increased dimpling just above the knee. This   motion will push the back of the knee toward the floor. 3. For __________ seconds, keep the muscle as tight as you can without increasing your pain. 4. Relax the muscles slowly and completely. Repeat __________ times. Complete this exercise __________ times a day. Exercise F: Straight Leg Raises - Quadriceps 1. Lie on your back with your left / right leg extended and your other knee bent. 2. Tense the muscles in the front of your left / right thigh. You should see your kneecap slide up or see increased dimpling just above the knee. Your thigh may  even shake a bit. 3. Keep these muscles tight as you raise your leg 4-6 inches (10-15 cm) off the floor. Do not let your knee bend. 4. Hold this position for __________ seconds. 5. Keep these muscles tense as you lower your leg. 6. Relax your muscles slowly and completely after each repetition. Repeat __________ times. Complete this exercise __________ times a day. Exercise G: Hamstring, Isometric 1. Lie on your back on a firm surface. 2. Bend your left / right knee approximately __________ degrees. 3. Dig your left / right heel into the surface as if you are trying to pull it toward your buttocks. Tighten the muscles in the back of your thighs to dig as hard as you can without increasing any pain. 4. Hold this position for __________ seconds. 5. Release the tension gradually and allow your muscles to relax completely for __________ seconds after each repetition. Repeat __________ times. Complete this exercise __________ times a day. Exercise H: Hamstring Curls  If told by your health care provider, do this exercise while wearing ankle weights. Begin with __________ weights. Then increase the weight by 1 lb (0.5 kg) increments. Do not wear ankle weights that are more than __________. 1. Lie on your abdomen with your legs straight. 2. Bend your left / right knee as far as you can without feeling pain. Keep your hips flat against the floor. 3. Hold this position for __________ seconds. 4. Slowly lower your leg to the starting position.  Repeat __________ times. Complete this exercise __________ times a day. Exercise I: Squats (Quadriceps) 1. Stand in front of a table, with your feet and knees pointing straight ahead. You may rest your hands on the table for balance but not for support. 2. Slowly bend your knees and lower your hips like you are going to sit in a chair. ? Keep your weight over your heels, not over your toes. ? Keep your lower legs upright so they are parallel with the table  legs. ? Do not let your hips go lower than your knees. ? Do not bend lower than told by your health care provider. ? If your knee pain increases, do not bend as low. 3. Hold the squat position for __________ seconds. 4. Slowly push with your legs to return to standing. Do not use your hands to pull yourself to standing. Repeat __________ times. Complete this exercise __________ times a day. Exercise J: Wall Slides (Quadriceps)  1. Lean your back against a smooth wall or door while you walk your feet out 18-24 inches (46-61 cm) from it. 2. Place your feet hip-width apart. 3. Slowly slide down the wall or door until your knees bend __________ degrees. Keep your knees over your heels, not over your toes. Keep your knees in line with your hips. 4. Hold for __________ seconds. Repeat __________ times. Complete this exercise __________ times a day. Exercise K: Straight Leg Raises -   Hip Abductors 1. Lie on your side with your left / right leg in the top position. Lie so your head, shoulder, knee, and hip line up. You may bend your bottom knee to help you keep your balance. 2. Roll your hips slightly forward so your hips are stacked directly over each other and your left / right knee is facing forward. 3. Leading with your heel, lift your top leg 4-6 inches (10-15 cm). You should feel the muscles in your outer hip lifting. ? Do not let your foot drift forward. ? Do not let your knee roll toward the ceiling. 4. Hold this position for __________ seconds. 5. Slowly return your leg to the starting position. 6. Let your muscles relax completely after each repetition. Repeat __________ times. Complete this exercise __________ times a day. Exercise L: Straight Leg Raises - Hip Extensors 1. Lie on your abdomen on a firm surface. You can put a pillow under your hips if that is more comfortable. 2. Tense the muscles in your buttocks and lift your left / right leg about 4-6 inches (10-15 cm). Keep your knee  straight as you lift your leg. 3. Hold this position for __________ seconds. 4. Slowly lower your leg to the starting position. 5. Let your leg relax completely after each repetition. Repeat __________ times. Complete this exercise __________ times a day. This information is not intended to replace advice given to you by your health care provider. Make sure you discuss any questions you have with your health care provider. Document Released: 04/26/2005 Document Revised: 03/06/2016 Document Reviewed: 04/18/2015 Elsevier Interactive Patient Education  2018 [employer] Hand Exercises Hand exercises can be helpful to almost anyone. These exercises can strengthen the hands, improve flexibility and movement, and increase blood flow to the hands. These results can make work and daily tasks easier. Hand exercises can be especially helpful for people who have joint pain from arthritis or have nerve damage from overuse (carpal tunnel syndrome). These exercises can also help people who have injured a hand. Most of these hand exercises are fairly gentle stretching routines. You can do them often throughout the day. Still, it is a good idea to ask your health care provider which exercises would be best for you. Warming your hands before exercise may help to reduce stiffness. You can do this with gentle massage or by placing your hands in warm water for 15 minutes. Also, make sure you pay attention to your level of hand pain as you begin an exercise routine. Exercises Knuckle bend Repeat this exercise 5-10 times with each hand. 1. Stand or sit with your arm, hand, and all five fingers pointed straight up. Make sure your wrist is straight. 2. Gently and slowly bend your fingers down and inward until the tips of your fingers are touching the tops of your palm. 3. Hold this position for a few seconds. 4. Extend your fingers out to their original position, all pointing straight up again. Finger fan Repeat this  exercise 5-10 times with each hand. 1. Hold your arm and hand out in front of you. Keep your wrist straight. 2. Squeeze your hand into a fist. 3. Hold this position for a few seconds. 4. Fan out, or spread apart, your hand and fingers as much as possible, stretching every joint fully. Tabletop Repeat this exercise 5-10 times with each hand. 1. Stand or sit with your arm, hand, and all five fingers pointed straight up. Make sure your wrist is straight. 2. Gently   and slowly bend your fingers at the knuckles where they meet the hand until your hand is making an upside-down L shape. Your fingers should form a tabletop. 3. Hold this position for a few seconds. 4. Extend your fingers out to their original position, all pointing straight up again. Making Os Repeat this exercise 5-10 times with each hand. 1. Stand or sit with your arm, hand, and all five fingers pointed straight up. Make sure your wrist is straight. 2. Make an O shape by touching your pointer finger to your thumb. Hold for a few seconds. Then open your hand wide. 3. Repeat this motion with each finger on your hand. Table spread Repeat this exercise 5-10 times with each hand. 1. Place your hand on a table with your palm facing down. Make sure your wrist is straight. 2. Spread your fingers out as much as possible. Hold this position for a few seconds. 3. Slide your fingers back together again. Hold for a few seconds. Ball grip Repeat this exercise 10-15 times with each hand. 1. Hold a tennis ball or another soft ball in your hand. 2. While slowly increasing pressure, squeeze the ball as hard as possible. 3. Squeeze as hard as you can for 3-5 seconds. 4. Relax and repeat.  Wrist curls Repeat this exercise 10-15 times with each hand. 1. Sit in a chair that has armrests. 2. Hold a light weight in your hand, such as a dumbbell that weighs 1-3 pounds (0.5-1.4 kg). Ask your health care provider what weight would be best for you. 3.  Rest your hand just over the end of the chair arm with your palm facing up. 4. Gently pivot your wrist up and down while holding the weight. Do not twist your wrist from side to side. Contact a health care provider if:  Your hand pain or discomfort gets much worse when you do an exercise.  Your hand pain or discomfort does not improve within 2 hours after you exercise. If you have any of these problems, stop doing these exercises right away. Do not do them again unless your health care provider says that you can. Get help right away if:  You develop sudden, severe hand pain. If this happens, stop doing these exercises right away. Do not do them again unless your health care provider says that you can. This information is not intended to replace advice given to you by your health care provider. Make sure you discuss any questions you have with your health care provider. Document Released: 05/24/2015 Document Revised: 10/16/2017 Document Reviewed: 12/21/2014 Elsevier Interactive Patient Education  2019 [employer] Inc[employer] Osteoarthritis  Osteoarthritis is a type of arthritis that affects tissue that covers the ends of bones in joints (cartilage). Cartilage acts as a cushion between the bones and helps them move smoothly. Osteoarthritis results when cartilage in the joints gets worn down. Osteoarthritis is sometimes called "wear and tear" arthritis. Osteoarthritis is the most common form of arthritis. It often occurs in older people. It is a condition that gets worse over time (a progressive condition). Joints that are most often affected by this condition are in:  Fingers.  Toes.  Hips.  Knees.  Spine, including neck and lower back. What are the causes? This condition is caused by age-related wearing down of cartilage that covers the ends of bones. What increases the risk? The following factors may make you more likely to develop this condition:  Older age.  Being overweight or obese.   Overuse of  joints, such as in athletes.  Past injury of a joint.  Past surgery on a joint.  Family history of osteoarthritis. What are the signs or symptoms? The main symptoms of this condition are pain, swelling, and stiffness in the joint. The joint may lose its shape over time. Small pieces of bone or cartilage may break off and float inside of the joint, which may cause more pain and damage to the joint. Small deposits of bone (osteophytes) may grow on the edges of the joint. Other symptoms may include:  A grating or scraping feeling inside the joint when you move it.  Popping or creaking sounds when you move. Symptoms may affect one or more joints. Osteoarthritis in a major joint, such as your knee or hip, can make it painful to walk or exercise. If you have osteoarthritis in your hands, you might not be able to grip items, twist your hand, or control small movements of your hands and fingers (fine motor skills). How is this diagnosed? This condition may be diagnosed based on:  Your medical history.  A physical exam.  Your symptoms.  X-rays of the affected joint(s).  Blood tests to rule out other types of arthritis. How is this treated? There is no cure for this condition, but treatment can help to control pain and improve joint function. Treatment plans may include:  A prescribed exercise program that allows for rest and joint relief. You may work with a physical therapist.  A weight control plan.  Pain relief techniques, such as: ? Applying heat and cold to the joint. ? Electric pulses delivered to nerve endings under the skin (transcutaneous electrical nerve stimulation, or TENS). ? Massage. ? Certain nutritional supplements.  NSAIDs or prescription medicines to help relieve pain.  Medicine to help relieve pain and inflammation (corticosteroids). This can be given by mouth (orally) or as an injection.  Assistive devices, such as a brace, wrap, splint, specialized  glove, or cane.  Surgery, such as: ? An osteotomy. This is done to reposition the bones and relieve pain or to remove loose pieces of bone and cartilage. ? Joint replacement surgery. You may need this surgery if you have very bad (advanced) osteoarthritis. Follow these instructions at home: Activity  Rest your affected joints as directed by your health care provider.  Do not drive or use heavy machinery while taking prescription pain medicine.  Exercise as directed. Your health care provider or physical therapist may recommend specific types of exercise, such as: ? Strengthening exercises. These are done to strengthen the muscles that support joints that are affected by arthritis. They can be performed with weights or with exercise bands to add resistance. ? Aerobic activities. These are exercises, such as brisk walking or water aerobics, that get your heart pumping. ? Range-of-motion activities. These keep your joints easy to move. ? Balance and agility exercises. Managing pain, stiffness, and swelling      If directed, apply heat to the affected area as often as told by your health care provider. Use the heat source that your health care provider recommends, such as a moist heat pack or a heating pad. ? If you have a removable assistive device, remove it as told by your health care provider. ? Place a towel between your skin and the heat source. If your health care provider tells you to keep the assistive device on while you apply heat, place a towel between the assistive device and the heat source. ? Leave the heat on  for 20-30 minutes. ? Remove the heat if your skin turns bright red. This is especially important if you are unable to feel pain, heat, or cold. You may have a greater risk of getting burned.  If directed, put ice on the affected joint: ? If you have a removable assistive device, remove it as told by your health care provider. ? Put ice in a plastic bag. ? Place a towel  between your skin and the bag. If your health care provider tells you to keep the assistive device on during icing, place a towel between the assistive device and the bag. ? Leave the ice on for 20 minutes, 2-3 times a day. General instructions  Take over-the-counter and prescription medicines only as told by your health care provider.  Maintain a healthy weight. Follow instructions from your health care provider for weight control. These may include dietary restrictions.  Do not use any products that contain nicotine or tobacco, such as cigarettes and e-cigarettes. These can delay bone healing. If you need help quitting, ask your health care provider.  Use assistive devices as directed by your health care provider.  Keep all follow-up visits as told by your health care provider. This is important. Where to find more information  [employer] of Arthritis and Musculoskeletal and Skin Diseases: www.niams.SouthExposed.es  [employer] on Aging: http://kim-miller.com/  American College of Rheumatology: www.rheumatology.org Contact a health care provider if:  Your skin turns red.  You develop a rash.  You have pain that gets worse.  You have a fever along with joint or muscle aches. Get help right away if:  You lose a lot of weight.  You suddenly lose your appetite.  You have night sweats. Summary  Osteoarthritis is a type of arthritis that affects tissue covering the ends of bones in joints (cartilage).  This condition is caused by age-related wearing down of cartilage that covers the ends of bones.  The main symptom of this condition is pain, swelling, and stiffness in the joint.  There is no cure for this condition, but treatment can help to control pain and improve joint function. This information is not intended to replace advice given to you by your health care provider. Make sure you discuss any questions you have with your health care provider. Document Released:  06/12/2005 Document Revised: 03/19/2017 Document Reviewed: 02/14/2016 Elsevier Interactive Patient Education  2019 [employer].

## 2018-09-11 ENCOUNTER — Other Ambulatory Visit: Payer: Self-pay | Admitting: Family Medicine

## 2018-09-13 ENCOUNTER — Telehealth: Payer: Self-pay | Admitting: Cardiovascular Disease

## 2018-09-13 NOTE — Telephone Encounter (Signed)
Returned call to patient. She states that she was told by her Rheumatologist that she had a diagnosis of Chronic Diastolic Heart Failure. Made patient aware that she had an echo done on 08/21/17 that showed grade 1 diastolic dysfunction and it looked like the diagnosis was associated to her chart when she was in the hospital for Afib.

## 2018-09-13 NOTE — Telephone Encounter (Signed)
New message    Patient states that she was advised by her rheumatologist that she had congestive heart failure in going over her records. The patient states that she was not aware of this and would like a call to discuss this information.

## 2018-09-16 NOTE — Telephone Encounter (Signed)
Anne [name] reports that her meloxicam was stopped due to the Eliquis therapy I will send to our pharmacists and see if she can safely  be on a lower dose of meloxicam

## 2018-09-16 NOTE — Telephone Encounter (Signed)
It is very common to develop echo evidence of diastolic CHF with age.  This can be considered to be a fairly common finding in women her age. She should avoid salt, keep her BP well controlled.  We can make medication adjustments based on symptoms ( ie adding diuretics, or other antihypertensives. )

## 2018-09-17 NOTE — Telephone Encounter (Signed)
Would recommend limiting NSAID use as much as possible due to concurrent diagnoses of HTN, CAD, and anticoagulation therapy. Recommend that she use the lowest dose possible for shortest duration of time and would encourage more Tylenol use for pain instead.

## 2018-09-18 ENCOUNTER — Other Ambulatory Visit: Payer: Self-pay | Admitting: Physician Assistant

## 2018-09-18 NOTE — Telephone Encounter (Signed)
Age 68, weight 81kg, SCr 0.9 checked 08/20/18, last OV 05/27/18

## 2018-09-18 NOTE — Telephone Encounter (Signed)
I spoke to Anne [name] pharmacist has reviewed It will be OK for Anne [name] to take a low dose Meloxicam ( 7.5 mg a day )  and try to take it intermittantly along with her eliquis. She will watch for HTN and any bleeding   Will ask [name], MD to call in a script for meloxicam 15 mg a day and Anne [name] will take 1/2 tablet a day  ( Ep[facility] says that the 15 mg tabs are $33 per month but the 7.5 mg tabs are $395 per months)

## 2018-09-19 MED ORDER — MELOXICAM 15 MG PO TABS: 7.5000 mg | ORAL_TABLET | Freq: Every day | ORAL | 3 refills | Status: DC | PRN

## 2018-09-19 NOTE — Telephone Encounter (Signed)
No problem.  Thank you!  She will be excited to hear this!  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

## 2018-09-19 NOTE — Telephone Encounter (Signed)
Called pt to notify her of medication recommendation. LVM informing her of RX being sent.[name], Lahoma Crocker, CMA

## 2018-10-09 ENCOUNTER — Ambulatory Visit: Payer: Self-pay | Admitting: Rheumatology

## 2018-10-21 ENCOUNTER — Other Ambulatory Visit: Payer: Self-pay

## 2018-10-21 MED ORDER — MELOXICAM 7.5 MG PO TABS: 7.5000 mg | ORAL_TABLET | Freq: Every day | ORAL | 1 refills | Status: DC

## 2018-12-02 ENCOUNTER — Ambulatory Visit: Payer: Medicare Other | Admitting: Physician Assistant

## 2018-12-03 ENCOUNTER — Encounter: Payer: Self-pay | Admitting: Cardiovascular Disease

## 2018-12-03 ENCOUNTER — Telehealth (INDEPENDENT_AMBULATORY_CARE_PROVIDER_SITE_OTHER): Payer: Medicare Other | Admitting: Cardiovascular Disease

## 2018-12-03 ENCOUNTER — Other Ambulatory Visit: Payer: Self-pay

## 2018-12-03 VITALS — BP 120/67 | HR 79 | Ht 65.0 in | Wt 175.0 lb

## 2018-12-03 DIAGNOSIS — R000 Tachycardia, unspecified: Secondary | ICD-10-CM

## 2018-12-03 DIAGNOSIS — I480 Paroxysmal atrial fibrillation: Secondary | ICD-10-CM

## 2018-12-03 DIAGNOSIS — Z7189 Other specified counseling: Secondary | ICD-10-CM

## 2018-12-03 NOTE — Patient Instructions (Signed)
Medication Instructions:  Your physician recommends that you continue on your current medications as directed. Please refer to the Current Medication list given to you today.  If you need a refill on your cardiac medications before your next appointment, please call your pharmacy.    Lab work: None Ordered    Testing/Procedures: Your physician has requested that you have a carotid duplex. This test is an ultrasound of the carotid arteries in your neck. It looks at blood flow through these arteries that supply the brain with blood. Allow one hour for this exam. There are no restrictions or special instructions.    Follow-Up: At [facility], you and your health needs are our priority.  As part of our continuing mission to provide you with exceptional heart care, we have created designated Provider Care Teams.  These Care Teams include your primary Cardiologist (physician) and Advanced Practice Providers (APPs -  Physician Assistants and Nurse Practitioners) who all work together to provide you with the care you need, when you need it. You will need a follow up appointment in:  6 months.  Please call our office 2 months in advance to schedule this appointment.  You may see Philip Nahser, MD or one of the following Advanced Practice Providers on your designated Care Team: Scott Weaver, PA-C Vin Bhagat, PA-C . Janine Hammond, NP   

## 2018-12-03 NOTE — Progress Notes (Signed)
Virtual Visit via Video Note   This visit type was conducted due to national recommendations for restrictions regarding the COVID-19 Pandemic (e.g. social distancing) in an effort to limit this patient's exposure and mitigate transmission in our community.  Due to her co-morbid illnesses, this patient is at least at moderate risk for complications without adequate follow up.  This format is felt to be most appropriate for this patient at this time.  All issues noted in this document were discussed and addressed.  A limited physical exam was performed with this format.  Please refer to the patient's chart for her consent to telehealth for [facility].   Date:  12/03/2018   ID:  Anne [name], DOB 21-Nov-1950, MRN [id number]  Patient Location: Home Provider Location: Home  PCP:  [name], MD  Cardiologist:  Mertie Moores, MD  Electrophysiologist:  None   Anne [name] is a 68 y.o. female with a hx of paroxysmal atrial fibrillation, hypertension, hyperlipidemia, hypothyroidism with prior thyroid thyroidectomy.    I met her in March , 2019 in the hospital when she was admitted with rapid AFib. The Afib Spontaneously converted.  She had a mild troponin bump.     Her TSH was very low and it was thought that she may have been over replaced with her Synthroid. Sleep study in June, 2019 was negative for obstructive sleep apnea.  He was recently seen by Richardson Dopp, PA in July for further evaluation of rapid atrial fibrillation.   CHADS2-VASc=4(age x1, female, HTN, aortic atherosclerosis).  Dec. 20, 2019 [name] is seen with husband, Clair Gulling   No further episodes of Afib Uses a Kardia monitor.   Husband also has Afib  Has a hissing sound in her right ear ,  Has seen ENT.    Evaluation Performed:  Follow-Up Visit  Chief Complaint:  Follow up atrial fib   December 03, 2018    Anne [name] is a 68 y.o. female with 68 year old female with a history of atrial  fibrillation.  Rates are fibrillation seems to be paroxysmal.  She uses her Kardia monitor on occasion  Saw her 6 months ago  Had a sleep study.   Has episodes of mildy elevated HR,  Usually around 100-105. Scott tried increasing toprol to 75 mg a day which made her fatigued. [name]  She went back to the  50 Mg tabs. She takes 1/2 tablet on occasions when her HR is fast    The patient does not have symptoms concerning for COVID-19 infection (fever, chills, cough, or new shortness of breath).    Past Medical History:  Diagnosis Date  . Acid reflux   . Aortic atherosclerosis ([place]) 04/28/2013   CT 08/2017  . Arthritis    "joints, hands" (08/30/2017)  . Chronic lower back pain   . Disc herniation    neck and low back  . Family history of adverse reaction to anesthesia    "daughter w/PONV"  . Follicular cystitis   . Hiatal hernia   . High cholesterol   . Hyperlipidemia    ELEV CHOLESTEROL  . Hypothyroidism   . Osteopenia 2014   T score -1.6 FRAX 13%/1%  . PAF (paroxysmal atrial fibrillation) ([place]) 08/30/2017   Echo 2/19: EF 55-60, normal wall motion, grade 1 diastolic dysfunction, mild PI, trivial TR //  Nuc 3/19: no ischemia or scar, EF 80 // CHADS2-VASc: 3 // Eliquis 5 bid   . Schatzki's ring    Past Surgical History:  Procedure Laterality Date  . BREAST BIOPSY Bilateral 1997   "excisional bx both benign"  . BREAST CYST ASPIRATION Right ~ 1993  . CATARACT EXTRACTION W/ INTRAOCULAR LENS IMPLANT Left 2011  . CATARACT EXTRACTION W/ INTRAOCULAR LENS IMPLANT Right 12/20/2012   Dr. Haynes Bast, OD  . COLONOSCOPY WITH ESOPHAGOGASTRODUODENOSCOPY (EGD)  "several"   "? dilated esophagus" (08/30/2017)  . DILATION AND CURETTAGE OF UTERUS  1996  . EYE MUSCLE SURGERY Bilateral 1992  . HERNIA REPAIR    . HYSTEROSCOPY    . LAPAROSCOPIC CHOLECYSTECTOMY  09/28/2011  . OOPHORECTOMY Right 04/2000   LAP RSO/LYSIS OF ADHESIONS  . THYROIDECTOMY  1976  . TUBAL LIGATION  1976  . VARICOSE VEIN  SURGERY Bilateral 1972  . VENTRAL HERNIA REPAIR  06/23/13   Dr. Franz Dell     Current Meds  Medication Sig  . CALCIUM PO Take 1 tablet by mouth daily.   . Cholecalciferol (VITAMIN D) 2000 UNITS tablet Take 2,000 Units by mouth daily.    [name] ELIQUIS 5 MG TABS tablet TAKE 1 TABLET BY MOUTH TWICE A DAY  . estradiol (ESTRACE) 0.1 MG/GM vaginal cream Place 4.03 Applicatorfuls vaginally as needed. For vaginal irritation  . meloxicam (MOBIC) 7.5 MG tablet Take 1 tablet (7.5 mg total) by mouth daily.  . metoprolol succinate (TOPROL-XL) 50 MG 24 hr tablet Take 50 mg by mouth daily. Take with or immediately following a meal.  . Multiple Vitamin (MULTIVITAMIN) tablet Take 1 tablet by mouth daily.    [name] omeprazole (PRILOSEC) 40 MG capsule TAKE 1 CAPSULE (40 MG TOTAL) BY MOUTH DAILY.  . simvastatin (ZOCOR) 40 MG tablet TAKE 1 TABLET BY MOUTH EVERY DAY  . SYNTHROID 75 MCG tablet Take 1 tablet (75 mcg total) by mouth daily before breakfast.     Allergies:   Amoxicillin-pot clavulanate; Amoxicillin-pot clavulanate; Ceftriaxone sodium; Codeine; Molds & smuts; and Zofran [ondansetron hcl]   Social History   Tobacco Use  . Smoking status: Former Smoker    Packs/day: 0.50    Years: 18.00    Pack years: 9.00    Types: Cigarettes    Last attempt to quit: 01/27/1979    Years since quitting: 39.8  . Smokeless tobacco: Never Used  Substance Use Topics  . Alcohol use: Yes    Comment: 08/30/2017 "might have glass of wine twice/year"  . Drug use: No     Family Hx: The patient's family history includes Anuerysm in her father; Arthritis in her daughter and sister; Breast cancer in her sister; Cancer in her sister; Cancer (age of onset: 67) in her brother; Heart attack (age of onset: 81) in her father; Heart disease in her father; Hyperparathyroidism in her daughter; Hypertension in her mother and sister; Leukemia in her brother; Lupus in her sister; Melanoma in her brother; Prostate cancer in her brother;  Pulmonary fibrosis in her brother; Rheum arthritis in her sister; Stroke in her sister.  ROS:   Please see the history of present illness.     All other systems reviewed and are negative.   Prior CV studies:   The following studies were reviewed today:    Labs/Other Tests and Data Reviewed:    EKG:  No ECG reviewed.  Recent Labs: 08/20/2018: ALT 21; BUN 8; Creat 0.90; Potassium 4.1; Sodium 141   Recent Lipid Panel Lab Results  Component Value Date/Time   CHOL 185 08/20/2018 09:47 AM   TRIG 120 08/20/2018 09:47 AM   HDL 48 (L) 08/20/2018 09:47 AM  CHOLHDL 3.9 08/20/2018 09:47 AM   LDLCALC 114 (H) 08/20/2018 09:47 AM    Wt Readings from Last 3 Encounters:  12/03/18 175 lb (79.4 kg)  09/10/18 178 lb 6.4 oz (80.9 kg)  08/20/18 176 lb (79.8 kg)     Objective:    Vital Signs:  BP 120/67 (BP Location: Left Arm, Patient Position: Sitting, Cuff Size: Normal)   Pulse 79   Ht _0  (1.651 m)   Wt 175 lb (79.4 kg)   LMP 09/08/2000   BMI 29.12 kg/m      ASSESSMENT & PLAN:    1. PAF :   Has rare episodes of PAF.  Most of her episodes of episodes of tachycardia are sinus tach.     2.  Sinus tach :  Continue extra metoprolol XL 1/2 tab as needed for tachycardia.  3.  Pulsatile tinnitus :   Will get a carotid duplex   Will see APP in 6 months .  COVID-19 Education: The signs and symptoms of COVID-19 were discussed with the patient and how to seek care for testing (follow up with PCP or arrange E-visit).  The importance of social distancing was discussed today.  Time:   Today, I have spent  18  minutes with the patient with telehealth technology discussing the above problems.     Medication Adjustments/Labs and Tests Ordered: Current medicines are reviewed at length with the patient today.  Concerns regarding medicines are outlined above.   Tests Ordered: No orders of the defined types were placed in this encounter.   Medication Changes: No orders of the defined  types were placed in this encounter.   Disposition:  Follow up in 6 month(s)  Signed, Mertie Moores, MD  12/03/2018 2:40 PM    [facility]

## 2018-12-05 ENCOUNTER — Ambulatory Visit (HOSPITAL_COMMUNITY)
Admission: RE | Admit: 2018-12-05 | Discharge: 2018-12-05 | Disposition: A | Discharge status: Home / Self Care | Payer: Medicare Other | Source: Ambulatory Visit | Attending: Cardiology | Admitting: Cardiology

## 2018-12-05 ENCOUNTER — Other Ambulatory Visit: Payer: Self-pay

## 2018-12-05 DIAGNOSIS — R000 Tachycardia, unspecified: Secondary | ICD-10-CM | POA: Diagnosis not present

## 2018-12-09 ENCOUNTER — Telehealth: Payer: Self-pay | Admitting: *Deleted

## 2018-12-09 DIAGNOSIS — I779 Disorder of arteries and arterioles, unspecified: Secondary | ICD-10-CM

## 2018-12-09 NOTE — Telephone Encounter (Signed)
Pt has been notified of carotid results and is agreeable to repeat in 2 yrs.

## 2018-12-09 NOTE — Telephone Encounter (Signed)
-----   Message from Thayer Headings, MD sent at 12/06/2018  3:38 PM EDT ----- Mild carotid artery disease.   Recheck in 2 years.

## 2018-12-16 ENCOUNTER — Other Ambulatory Visit: Payer: Self-pay | Admitting: Family Medicine

## 2018-12-27 ENCOUNTER — Other Ambulatory Visit: Payer: Self-pay | Admitting: Physician Assistant

## 2018-12-30 NOTE — Telephone Encounter (Signed)
Eliquis 5mg  refill request received; pt is 68 yrs old, wt-80.9kg, Crea-0.90 on 08/20/2018, last seen by Telemedicine visit by Dr. Acie Fredrickson on 12/03/2018; will send in refill to requested pharmacy.

## 2019-01-10 ENCOUNTER — Other Ambulatory Visit: Payer: Self-pay | Admitting: Family Medicine

## 2019-01-10 DIAGNOSIS — Z1231 Encounter for screening mammogram for malignant neoplasm of breast: Secondary | ICD-10-CM

## 2019-01-17 ENCOUNTER — Other Ambulatory Visit: Payer: Self-pay | Admitting: Family Medicine

## 2019-01-17 DIAGNOSIS — E039 Hypothyroidism, unspecified: Secondary | ICD-10-CM

## 2019-01-28 ENCOUNTER — Telehealth: Payer: Self-pay | Admitting: Family Medicine

## 2019-01-28 DIAGNOSIS — I480 Paroxysmal atrial fibrillation: Secondary | ICD-10-CM

## 2019-01-28 DIAGNOSIS — E039 Hypothyroidism, unspecified: Secondary | ICD-10-CM

## 2019-01-28 NOTE — Telephone Encounter (Signed)
Pt's appointment has been rescheduled to August with(you will be at [facility] on 8/25). Patient wants to know if you will be doing  a lab order so that she can get her medication.

## 2019-02-03 NOTE — Telephone Encounter (Signed)
Labs ordered. She can go at her convenience.

## 2019-02-03 NOTE — Telephone Encounter (Signed)
Pt advised.

## 2019-02-04 ENCOUNTER — Telehealth: Payer: Self-pay

## 2019-02-04 DIAGNOSIS — E039 Hypothyroidism, unspecified: Secondary | ICD-10-CM

## 2019-02-04 NOTE — Telephone Encounter (Signed)
Thank you :)

## 2019-02-04 NOTE — Telephone Encounter (Signed)
Perfect thank you!

## 2019-02-04 NOTE — Telephone Encounter (Signed)
Anne [name] called and wanted to make sure there is an order for TSH and Free T4. I changed the order. Please advise if not appropriate.

## 2019-02-10 DIAGNOSIS — E039 Hypothyroidism, unspecified: Secondary | ICD-10-CM | POA: Diagnosis not present

## 2019-02-11 LAB — BASIC METABOLIC PANEL WITH GFR
BUN: 10 mg/dL (ref 7–25)
CO2: 28 mmol/L (ref 20–32)
Calcium: 9.3 mg/dL (ref 8.6–10.4)
Chloride: 104 mmol/L (ref 98–110)
Creat: 0.82 mg/dL (ref 0.60–0.93)
GFR, Est African American: 84 mL/min/{1.73_m2} (ref >=60)
GFR, Est Non African American: 72 mL/min/{1.73_m2} (ref >=60)
Glucose, Bld: 111 mg/dL — ABNORMAL HIGH (ref 65–99)
Potassium: 4.1 mmol/L (ref 3.5–5.3)
Sodium: 140 mmol/L (ref 135–146)

## 2019-02-11 LAB — TSH+FREE T4: TSH W/REFLEX TO FT4: 1.21 mIU/L (ref 0.40–4.50)

## 2019-02-18 ENCOUNTER — Ambulatory Visit: Payer: Medicare Other | Admitting: Family Medicine

## 2019-02-19 ENCOUNTER — Encounter: Payer: Self-pay | Admitting: Family Medicine

## 2019-02-19 ENCOUNTER — Ambulatory Visit (INDEPENDENT_AMBULATORY_CARE_PROVIDER_SITE_OTHER): Payer: Medicare Other | Admitting: Family Medicine

## 2019-02-19 ENCOUNTER — Other Ambulatory Visit: Payer: Self-pay

## 2019-02-19 VITALS — BP 111/68 | HR 85 | Ht 65.0 in | Wt 179.0 lb

## 2019-02-19 DIAGNOSIS — E039 Hypothyroidism, unspecified: Secondary | ICD-10-CM | POA: Diagnosis not present

## 2019-02-19 DIAGNOSIS — I480 Paroxysmal atrial fibrillation: Secondary | ICD-10-CM

## 2019-02-19 DIAGNOSIS — I700 Atherosclerosis of aorta: Secondary | ICD-10-CM

## 2019-02-19 DIAGNOSIS — M7989 Other specified soft tissue disorders: Secondary | ICD-10-CM

## 2019-02-19 DIAGNOSIS — I10 Essential (primary) hypertension: Secondary | ICD-10-CM

## 2019-02-19 DIAGNOSIS — R252 Cramp and spasm: Secondary | ICD-10-CM | POA: Diagnosis not present

## 2019-02-19 DIAGNOSIS — R7309 Other abnormal glucose: Secondary | ICD-10-CM | POA: Diagnosis not present

## 2019-02-19 DIAGNOSIS — I5032 Chronic diastolic (congestive) heart failure: Secondary | ICD-10-CM | POA: Diagnosis not present

## 2019-02-19 LAB — POCT GLYCOSYLATED HEMOGLOBIN (HGB A1C): Hemoglobin A1C: 5.3 % (ref 4.0–5.6)

## 2019-02-19 NOTE — Progress Notes (Signed)
Pt reports some red splotches bilaterally on her lower legs, and swelling on the L. She believes that this is caused by the eliquis. She has been walking more and is experiencing numbness in both feet.   She also stated that she has

## 2019-02-19 NOTE — Assessment & Plan Note (Signed)
Continue with statin therapy.  ?

## 2019-02-19 NOTE — Progress Notes (Addendum)
Established Patient Office Visit  Subjective:  Patient ID: Anne [name], female    DOB: [birth year]  Age: 68 y.o. MRN: [id number]  CC:  Chief Complaint  Patient presents with  . Hypothyroidism    HPI Anne [name] presents for   Hypertension- Pt denies chest pain, SOB, dizziness, or heart palpitations.  Taking meds as directed w/o problems.  Denies medication side effects.  She did let me know she has been walking more for exercise for the last 2 weeks it has been daily. Her husband needed to lose weight so they have been walking together.   She has notices some redness and swelling in her left leg that comes and goes and did want to discuss that today. It is not painful.  She has noticed some numbness and tingling in her feet as well. She wants to have her magnesium checked.    Follow-up paroxysmal atrial fibrillation-she is currently on Eliquis and doing well. Has been waking up around 5-6 AM with increased heart rate and feeling her heart beat in her ears.  HR in the 90-100 range.  Had spoken to Cardiology about it.    Chronic diastolic heart failure-no signs of volume overload.  No shortness of breath or chest pain.  Aortic atherosclerosis-again no recent chest pain or shortness of breath.  She is currently on simvastatin and reports she is taking it regularly without any side effects or myalgias.  Hypothyroidism - Taking medication regularly in the AM away from food and vitamins, etc. No recent change to skin, hair, or energy levels.   Past Medical History:  Diagnosis Date  . Acid reflux   . Aortic atherosclerosis ([place]) 04/28/2013   CT 08/2017  . Arthritis    "joints, hands" (08/30/2017)  . Chronic lower back pain   . Disc herniation    neck and low back  . Family history of adverse reaction to anesthesia    "daughter w/PONV"  . Follicular cystitis   . Hiatal hernia   . High cholesterol   . Hyperlipidemia    ELEV CHOLESTEROL  . Hypothyroidism   . Osteopenia 2014    T score -1.6 FRAX 13%/1%  . PAF (paroxysmal atrial fibrillation) ([place]) 08/30/2017   Echo 2/19: EF 55-60, normal wall motion, grade 1 diastolic dysfunction, mild PI, trivial TR //  Nuc 3/19: no ischemia or scar, EF 80 // CHADS2-VASc: 3 // Eliquis 5 bid   . Schatzki's ring     Past Surgical History:  Procedure Laterality Date  . BREAST BIOPSY Bilateral 1997   "excisional bx both benign"  . BREAST CYST ASPIRATION Right ~ 1993  . CATARACT EXTRACTION W/ INTRAOCULAR LENS IMPLANT Left 2011  . CATARACT EXTRACTION W/ INTRAOCULAR LENS IMPLANT Right 12/20/2012   Dr. Haynes Bast, OD  . COLONOSCOPY WITH ESOPHAGOGASTRODUODENOSCOPY (EGD)  "several"   "? dilated esophagus" (08/30/2017)  . DILATION AND CURETTAGE OF UTERUS  1996  . EYE MUSCLE SURGERY Bilateral 1992  . HERNIA REPAIR    . HYSTEROSCOPY    . LAPAROSCOPIC CHOLECYSTECTOMY  09/28/2011  . OOPHORECTOMY Right 04/2000   LAP RSO/LYSIS OF ADHESIONS  . THYROIDECTOMY  1976  . TUBAL LIGATION  1976  . VARICOSE VEIN SURGERY Bilateral 1972  . VENTRAL HERNIA REPAIR  06/23/13   Dr. Franz Dell    Family History  Problem Relation Age of Onset  . Cancer Brother 71       melanoma  . Melanoma Brother   . Prostate cancer Brother   .  Leukemia Brother   . Pulmonary fibrosis Brother   . Stroke Sister   . Hypertension Sister   . Arthritis Sister   . Lupus Sister   . Cancer Sister   . Breast cancer Sister        Age 99  . Rheum arthritis Sister   . Arthritis Daughter        Rheumatoid  . Hyperparathyroidism Daughter   . Heart attack Father 21  . Heart disease Father   . Anuerysm Father   . Hypertension Mother     Social History   Socioeconomic History  . Marital status: Married    Spouse name: [name]  . Number of children: 1  . Years of education: Not on file  . Highest education level: Not on file  Occupational History  . Not on file  Social Needs  . Financial resource strain: Not on file  . Food insecurity    Worry: Not on  file    Inability: Not on file  . Transportation needs    Medical: Not on file    Non-medical: Not on file  Tobacco Use  . Smoking status: Former Smoker    Packs/day: 0.50    Years: 18.00    Pack years: 9.00    Types: Cigarettes    Quit date: 01/27/1979    Years since quitting: 40.0  . Smokeless tobacco: Never Used  Substance and Sexual Activity  . Alcohol use: Yes    Comment: 08/30/2017 "might have glass of wine twice/year"  . Drug use: No  . Sexual activity: Not on file  Lifestyle  . Physical activity    Days per week: Not on file    Minutes per session: Not on file  . Stress: Not on file  Relationships  . Social Herbalist on phone: Not on file    Gets together: Not on file    Attends religious service: Not on file    Active member of club or organization: Not on file    Attends meetings of clubs or organizations: Not on file    Relationship status: Not on file  . Intimate partner violence    Fear of current or ex partner: Not on file    Emotionally abused: Not on file    Physically abused: Not on file    Forced sexual activity: Not on file  Other Topics Concern  . Not on file  Social History Narrative   4 brothers and 6 sisters. All 4 brother deceased.  2 sisters deceased.  No regular exercise. Still working.      Outpatient Medications Prior to Visit  Medication Sig Dispense Refill  . CALCIUM PO Take 1 tablet by mouth daily.     . Cholecalciferol (VITAMIN D) 2000 UNITS tablet Take 2,000 Units by mouth daily.      [name] ELIQUIS 5 MG TABS tablet TAKE 1 TABLET BY MOUTH TWICE A DAY 180 tablet 1  . estradiol (ESTRACE) 0.1 MG/GM vaginal cream Place [id number] Applicatorfuls vaginally as needed. For vaginal irritation 42.5 g 2  . meloxicam (MOBIC) 7.5 MG tablet Take 1 tablet (7.5 mg total) by mouth daily. 90 tablet 1  . metoprolol succinate (TOPROL-XL) 50 MG 24 hr tablet Take 50 mg by mouth daily. Take with or immediately following a meal.    . Multiple Vitamin  (MULTIVITAMIN) tablet Take 1 tablet by mouth daily.      [name] omeprazole (PRILOSEC) 40 MG capsule TAKE 1 CAPSULE (40 MG  TOTAL) BY MOUTH DAILY. 90 capsule 1  . simvastatin (ZOCOR) 40 MG tablet TAKE 1 TABLET BY MOUTH EVERY DAY 90 tablet 1  . SYNTHROID 75 MCG tablet TAKE 1 TABLET (75 MCG TOTAL) BY MOUTH DAILY BEFORE BREAKFAST. 90 tablet 2   No facility-administered medications prior to visit.     Allergies  Allergen Reactions  . Amoxicillin-Pot Clavulanate     REACTION: blisters in colon  . Amoxicillin-Pot Clavulanate Other (See Comments)    Colon blisters Blistered colon  . Ceftriaxone Sodium     REACTION: throat swells (ROCEPHIN)  . Codeine     REACTION: nausea  . Molds & Smuts   . Zofran [Ondansetron Hcl]     Headaches    ROS Review of Systems    Objective:    Physical Exam  Constitutional: She is oriented to person, place, and time. She appears well-developed and well-nourished.  HENT:  Head: Normocephalic and atraumatic.  Cardiovascular: Normal rate, regular rhythm and normal heart sounds.  Pulmonary/Chest: Effort normal and breath sounds normal.  Musculoskeletal:        General: No edema.     Comments: Trace edema of the left lower leg.  Some mild erythema.   Neurological: She is alert and oriented to person, place, and time.  Skin: Skin is warm and dry.  Psychiatric: She has a normal mood and affect. Her behavior is normal.    BP 111/68   Pulse 85   Ht 5\' 5"  (1.651 m)   Wt 179 lb (81.2 kg)   LMP 09/08/2000   SpO2 100%   BMI 29.79 kg/m  Wt Readings from Last 3 Encounters:  02/19/19 179 lb (81.2 kg)  12/03/18 175 lb (79.4 kg)  09/10/18 178 lb 6.4 oz (80.9 kg)     Health Maintenance Due  Topic Date Due  . DEXA SCAN  05/30/2018    There are no preventive care reminders to display for this patient.  Lab Results  Component Value Date   TSH 1.190 09/25/2017   Lab Results  Component Value Date   WBC 5.7 09/25/2017   HGB 14.2 09/25/2017   HCT 42.1  09/25/2017   MCV 85 09/25/2017   PLT 194 09/25/2017   Lab Results  Component Value Date   NA 140 02/10/2019   K 4.1 02/10/2019   CO2 28 02/10/2019   GLUCOSE 111 (H) 02/10/2019   BUN 10 02/10/2019   CREATININE 0.82 02/10/2019   BILITOT 0.7 08/20/2018   ALKPHOS 81 08/30/2017   AST 19 08/20/2018   ALT 21 08/20/2018   PROT 7.0 08/20/2018   ALBUMIN 4.2 08/30/2017   CALCIUM 9.3 02/10/2019   ANIONGAP 13 08/30/2017   Lab Results  Component Value Date   CHOL 185 08/20/2018   Lab Results  Component Value Date   HDL 48 (L) 08/20/2018   Lab Results  Component Value Date   LDLCALC 114 (H) 08/20/2018   Lab Results  Component Value Date   TRIG 120 08/20/2018   Lab Results  Component Value Date   CHOLHDL 3.9 08/20/2018   Lab Results  Component Value Date   HGBA1C 5.3 02/19/2019      Assessment & Plan:   Problem List Items Addressed This Visit      Cardiovascular and Mediastinum   Paroxysmal atrial fibrillation ([place])    Stable on Eliquis      Relevant Orders   Magnesium   Essential hypertension    Well controlled. Continue current regimen. Follow up in  6 mo      Relevant Orders   Magnesium   Chronic diastolic (congestive) heart failure (HCC)    Stable.  Asymptomatic.  No signs of volume overload.      Relevant Orders   Magnesium   Aortic atherosclerosis ([place]) - Primary    Continue with statin therapy.      Relevant Orders   Magnesium     Endocrine   Hypothyroidism   Relevant Orders   Magnesium    Other Visit Diagnoses    Abnormal glucose       Relevant Orders   Magnesium   POCT glycosylated hemoglobin (Hb A1C) (Completed)   Leg cramps       Relevant Orders   Magnesium   Left leg swelling         Left leg swelling - we dicussed likely venous stasis as cause.  Discussed compression stocking and regular exercise.   Leg cramping - will check magnesium.    No orders of the defined types were placed in this encounter.   Follow-up: Return  in about 6 months (around 08/22/2019) for bp and thyroid.    Beatrice Lecher, MD

## 2019-02-19 NOTE — Assessment & Plan Note (Signed)
Well controlled. Continue current regimen. Follow up in  6 mo  

## 2019-02-19 NOTE — Assessment & Plan Note (Signed)
Stable.  Asymptomatic.  No signs of volume overload.

## 2019-02-19 NOTE — Assessment & Plan Note (Signed)
Stable on Eliquis

## 2019-02-20 LAB — MAGNESIUM: Magnesium: 2.1 mg/dL (ref 1.5–2.5)

## 2019-02-24 ENCOUNTER — Other Ambulatory Visit: Payer: Self-pay

## 2019-02-24 ENCOUNTER — Ambulatory Visit
Admission: RE | Admit: 2019-02-24 | Discharge: 2019-02-24 | Disposition: A | Discharge status: Home / Self Care | Payer: Medicare Other | Source: Ambulatory Visit | Attending: Family Medicine | Admitting: Family Medicine

## 2019-02-24 DIAGNOSIS — Z1231 Encounter for screening mammogram for malignant neoplasm of breast: Secondary | ICD-10-CM | POA: Diagnosis not present

## 2019-02-26 ENCOUNTER — Ambulatory Visit (INDEPENDENT_AMBULATORY_CARE_PROVIDER_SITE_OTHER): Payer: Medicare Other | Admitting: Rheumatology

## 2019-02-26 ENCOUNTER — Ambulatory Visit: Payer: Self-pay

## 2019-02-26 ENCOUNTER — Other Ambulatory Visit: Payer: Self-pay

## 2019-02-26 DIAGNOSIS — M79642 Pain in left hand: Secondary | ICD-10-CM

## 2019-02-26 DIAGNOSIS — M79641 Pain in right hand: Secondary | ICD-10-CM | POA: Diagnosis not present

## 2019-02-26 NOTE — Progress Notes (Signed)
Office Visit Note  Patient: Anne [name]             Date of Birth: 12-18-1950           MRN: [id number]             PCP: [name], MD Referring: [name], * Visit Date: 03/11/2019 Occupation: @GUAROCC @  Subjective:  Left knee joint pain and joint swelling   History of Present Illness: Anne [name] is a 68 y.o. female with history of anti-CCP positive, osteoarthritis, and DDD.  She has a known family history of rheumatoid arthritis. She has been experiencing left knee joint pain intermittently.  She denies any joint swelling. She reports she has experiences pedal edema in the left LE if she stands for prolonged periods of time. She states the edema resolves if she develops the left LE. She continues to have triggering of the right middle and left ring fingers. She does not want cortisone injections at this time. She has occasional lower back pain, dependent on her level of activity. She denies any other joint pain or joint swelling at this time.   Activities of Daily Living:  Patient reports morning stiffness for 20  minutes.   Patient Denies nocturnal pain.  Difficulty dressing/grooming: Denies Difficulty climbing stairs: Reports Difficulty getting out of chair: Denies Difficulty using hands for taps, buttons, cutlery, and/or writing: Denies  Review of Systems  Constitutional: Negative for fatigue.  HENT: Positive for mouth dryness. Negative for mouth sores and nose dryness.   Eyes: Positive for dryness. Negative for pain and visual disturbance.  Respiratory: Negative for cough, hemoptysis, shortness of breath and difficulty breathing.   Cardiovascular: Positive for swelling in legs/feet. Negative for chest pain, palpitations and hypertension.  Gastrointestinal: Negative for blood in stool, constipation and diarrhea.  Endocrine: Negative for increased urination.  Genitourinary: Negative for painful urination and involuntary urination.   Musculoskeletal: Positive for arthralgias, joint pain, morning stiffness and muscle tenderness. Negative for joint swelling, myalgias, muscle weakness and myalgias.  Skin: Negative for color change, pallor, rash, hair loss, nodules/bumps, skin tightness, ulcers and sensitivity to sunlight.  Allergic/Immunologic: Negative for susceptible to infections.  Neurological: Negative for dizziness, numbness, headaches and weakness.  Hematological: Negative for bruising/bleeding tendency and swollen glands.  Psychiatric/Behavioral: Negative for depressed mood and sleep disturbance. The patient is not nervous/anxious.     PMFS History:  Patient Active Problem List   Diagnosis Date Noted  . Essential hypertension 12/25/2017  . GERD (gastroesophageal reflux disease) 08/30/2017  . Chronic diastolic (congestive) heart failure ([place]) 08/30/2017  . Paroxysmal atrial fibrillation ([place]) 08/30/2017  . Flushing 07/30/2017  . Paresthesia of both hands 07/30/2017  . 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 Aortic atherosclerosis ([place]) 04/28/2013  . Schatzki's ring 10/16/2012  . Spinal stenosis of lumbar region 08/16/2012  . Vaginal atrophy 11/14/2011  . Disc herniation   . Osteopenia   . PERSONAL HISTORY OF ALLERGY TO LATEX 12/23/2009  . UNSPECIFIED DISEASE OF PHARYNX 12/16/2008  . FATIGUE 11/10/2008  . Intermittent palpitations 11/10/2008  . Hypothyroidism 09/22/2008  . Hyperlipidemia 09/22/2008  . ALLERGIC RHINITIS 09/22/2008    Past Medical History:  Diagnosis Date  . Acid reflux   . Aortic atherosclerosis ([place]) 04/28/2013   CT 08/2017  . Arthritis    "joints, hands" (08/30/2017)  . Chronic lower back pain   . Disc herniation    neck and low back  . Family history of adverse reaction to anesthesia    "  daughter w/PONV"  . Follicular cystitis   . Hiatal hernia   . High cholesterol   . Hyperlipidemia    ELEV CHOLESTEROL  . Hypothyroidism   . Osteopenia 2014   T score -1.6 FRAX 13%/1%  . PAF  (paroxysmal atrial fibrillation) ([place]) 08/30/2017   Echo 2/19: EF 55-60, normal wall motion, grade 1 diastolic dysfunction, mild PI, trivial TR //  Nuc 3/19: no ischemia or scar, EF 80 // CHADS2-VASc: 3 // Eliquis 5 bid   . Schatzki's ring     Family History  Problem Relation Age of Onset  . Cancer Brother 82       melanoma  . Melanoma Brother   . Prostate cancer Brother   . Leukemia Brother   . Pulmonary fibrosis Brother   . Stroke Sister   . Hypertension Sister   . Arthritis Sister   . Lupus Sister   . Cancer Sister   . Breast cancer Sister        Age 78  . Rheum arthritis Sister   . Arthritis Daughter        Rheumatoid  . Hyperparathyroidism Daughter   . Heart attack Father 49  . Heart disease Father   . Anuerysm Father   . Hypertension Mother    Past Surgical History:  Procedure Laterality Date  . BREAST BIOPSY Bilateral 1997   "excisional bx both benign"  . BREAST CYST ASPIRATION Right ~ 1993  . CATARACT EXTRACTION W/ INTRAOCULAR LENS IMPLANT Left 2011  . CATARACT EXTRACTION W/ INTRAOCULAR LENS IMPLANT Right 12/20/2012   Dr. Haynes Bast, OD  . COLONOSCOPY WITH ESOPHAGOGASTRODUODENOSCOPY (EGD)  "several"   "? dilated esophagus" (08/30/2017)  . DILATION AND CURETTAGE OF UTERUS  1996  . EYE MUSCLE SURGERY Bilateral 1992  . HERNIA REPAIR    . HYSTEROSCOPY    . LAPAROSCOPIC CHOLECYSTECTOMY  09/28/2011  . OOPHORECTOMY Right 04/2000   LAP RSO/LYSIS OF ADHESIONS  . THYROIDECTOMY  1976  . TUBAL LIGATION  1976  . VARICOSE VEIN SURGERY Bilateral 1972  . VENTRAL HERNIA REPAIR  06/23/13   Dr. Franz Dell   Social History   Social History Narrative   4 brothers and 6 sisters. All 4 brother deceased.  2 sisters deceased.  No regular exercise. Still working.     Immunization History  Administered Date(s) Administered  . Influenza Split 06/02/2011, 05/03/2012  . Influenza Whole 04/16/2009  . Influenza,inj,Quad PF,6+ Mos 05/01/2013, 04/17/2014  . Pneumococcal  Conjugate-13 10/17/2013  . Pneumococcal Polysaccharide-23 02/04/2016  . Td 01/21/2010  . Zoster 08/18/2011     Objective: Vital Signs: BP 110/64 (BP Location: Left Arm, Patient Position: Sitting, Cuff Size: Normal)   Pulse 73   Resp 16   Ht 5\' 5"  (1.651 m)   Wt 182 lb (82.6 kg)   LMP 09/08/2000   BMI 30.29 kg/m    Physical Exam Vitals signs and nursing note reviewed.  Constitutional:      Appearance: She is well-developed.  HENT:     Head: Normocephalic and atraumatic.  Eyes:     Conjunctiva/sclera: Conjunctivae normal.  Neck:     Musculoskeletal: Normal range of motion.  Cardiovascular:     Rate and Rhythm: Normal rate and regular rhythm.     Heart sounds: Normal heart sounds.  Pulmonary:     Effort: Pulmonary effort is normal.     Breath sounds: Normal breath sounds.  Abdominal:     General: Bowel sounds are normal.     Palpations: Abdomen  is soft.  Lymphadenopathy:     Cervical: No cervical adenopathy.  Skin:    General: Skin is warm and dry.     Capillary Refill: Capillary refill takes less than 2 seconds.  Neurological:     Mental Status: She is alert and oriented to person, place, and time.  Psychiatric:        Behavior: Behavior normal.      Musculoskeletal Exam: C-spine, thoracic spine, and lumbar spine good ROM.  No midline spinal tenderness.  No SI joint tenderness.  Shoulder joints, elbow joints, wrist joints, MCPs, PIPs, and DIPs good ROM with no synovitis. Right middle trigger finger.  Left ring trigger finger noted.  Hip joints good ROM with no discomfort.  Knee joints, ankle joints, MTPs, PIPs, and DIPs good ROM with no synovitis.  No warmth or effusion of knee joints noted.  No tenderness or swelling of ankle joints.     CDAI Exam: CDAI Score: - Patient Global: -; Provider Global: - Swollen: -; Tender: - Joint Exam   No joint exam has been documented for this visit   There is currently no information documented on the homunculus. Go to the  Rheumatology activity and complete the homunculus joint exam.  Investigation: No additional findings.  Imaging: [place] Extrem Up Bilat Comp  Result Date: 02/26/2019 Ultrasound examination of bilateral hands was performed per EULAR recommendations. Using 12 MHz transducer, grayscale and power Doppler bilateral second, third, and fifth MCP joints and bilateral wrist joints both dorsal and volar aspects were evaluated to look for synovitis or tenosynovitis. The findings were there was no synovitis or tenosynovitis on ultrasound examination. Right median nerve was bifid,  0.06 cm squares which was within normal limits and left median nerve was bifid, 0.09 cm squares which was within normal limits. Impression: Ultrasound examination of bilateral hands did not show any synovitis.  Bilateral median nerves are within normal limits.  Mm 3d Screen Breast Bilateral  Result Date: 02/25/2019 CLINICAL DATA:  Screening. EXAM: DIGITAL SCREENING BILATERAL MAMMOGRAM WITH TOMO AND CAD COMPARISON:  Previous exam(s). ACR Breast Density Category c: The breast tissue is heterogeneously dense, which may obscure small masses. FINDINGS: There are no findings suspicious for malignancy. Images were processed with CAD. IMPRESSION: No mammographic evidence of malignancy. A result letter of this screening mammogram will be mailed directly to the patient. RECOMMENDATION: Screening mammogram in one year. (Code:SM-B-01Y) BI-RADS CATEGORY  1: Negative. Electronically Signed   By: Evangeline Dakin M.D.   On: 02/25/2019 14:13    Recent Labs: Lab Results  Component Value Date   WBC 5.7 09/25/2017   HGB 14.2 09/25/2017   PLT 194 09/25/2017   NA 140 02/10/2019   K 4.1 02/10/2019   CL 104 02/10/2019   CO2 28 02/10/2019   GLUCOSE 111 (H) 02/10/2019   BUN 10 02/10/2019   CREATININE 0.82 02/10/2019   BILITOT 0.7 08/20/2018   ALKPHOS 81 08/30/2017   AST 19 08/20/2018   ALT 21 08/20/2018   PROT 7.0 08/20/2018   ALBUMIN 4.2 08/30/2017    CALCIUM 9.3 02/10/2019   GFRAA 84 02/10/2019  08/20/18: ANA 1:80 cytoplasmic, 1:40 NH, TSH 2.35, RF<14, Sed rate 11, CCP 128, uric acid 4.2  Speciality Comments: No specialty comments available.  Procedures:  No procedures performed Allergies: Amoxicillin-pot clavulanate, Amoxicillin-pot clavulanate, Ceftriaxone sodium, Codeine, Molds & smuts, and Zofran [ondansetron hcl]   Assessment / Plan:     Visit Diagnoses: Positive anti-CCP test - ANA 1:80 cytoplasmic, 1:40 NH, RF  negative, positive family history of rheumatoid arthritis: She has no clinical features of autoimmune disease at this time.  She has no active synovitis.  She has intermittent left knee joint pain but no warmth or effusion was noted on examination today.  We discussed that if she develops any increased joint pain or joint swelling to notify us.  She will follow-up in 1 year.  Primary osteoarthritis of both hands: She has PIP and DIP synovial thickening consistent with osteoarthritis of both hands.  She has no tenderness or synovitis on exam.  She has complete fist formation bilaterally.  She will be unable to take much anti-inflammatories due to being on long-term Eliquis.  Primary osteoarthritis of both knees - Bilateral mild osteoarthritis and moderate chondromalacia patella: She has good range of motion with no discomfort.  No warmth or effusion was noted.  She experiences intermittent discomfort in the left knee joint but has no inflammation on exam.  Trigger finger, left ring finger: She has tenderness on exam. She has been experiencing a locking sensation in the morning.  She declined a cortisone injection.  Trigger finger, right middle finger: She has tenderness and has been experiencing locking first thing in the morning.  She declined an injection at this time.  DDD (degenerative disc disease), cervical: She has good ROM with no discomfort. No symptoms of radiculopathy.   Spinal stenosis of lumbar region, unspecified  whether neurogenic claudication present: She has occasional discomfort dependent on her level of activity.   Other medical conditions are listed as follows:   Chronic diastolic (congestive) heart failure (HCC)  Essential hypertension  Paroxysmal atrial fibrillation (HCC)  Aortic atherosclerosis (HCC)  Postsurgical hypothyroidism  History of gastroesophageal reflux (GERD)  History of hyperlipidemia  Former smoker  Family history of rheumatoid arthritis  Orders: No orders of the defined types were placed in this encounter.  No orders of the defined types were placed in this encounter.    Follow-Up Instructions: Return in 1 year (on 03/10/2020) for Osteoarthritis.   Ofilia Neas, PA-C   I examined and evaluated the patient with Hazel Sams PA.  Patient has positive anti-CCP antibody.  Association of anti-CCP with rheumatoid arthritis was discussed at length.  I advised her to contact me in case she develops any new symptoms.  The plan of care was discussed as noted above.  Bo Merino, MD Note - This record has been created using Editor, commissioning.  Chart creation errors have been sought, but may not always  have been located. Such creation errors do not reflect on  the standard of medical care.

## 2019-03-11 ENCOUNTER — Encounter: Payer: Self-pay | Admitting: Rheumatology

## 2019-03-11 ENCOUNTER — Other Ambulatory Visit: Payer: Self-pay

## 2019-03-11 ENCOUNTER — Ambulatory Visit (INDEPENDENT_AMBULATORY_CARE_PROVIDER_SITE_OTHER): Payer: Medicare Other | Admitting: Rheumatology

## 2019-03-11 VITALS — BP 110/64 | HR 73 | Resp 16 | Ht 65.0 in | Wt 182.0 lb

## 2019-03-11 DIAGNOSIS — M19042 Primary osteoarthritis, left hand: Secondary | ICD-10-CM

## 2019-03-11 DIAGNOSIS — Z8639 Personal history of other endocrine, nutritional and metabolic disease: Secondary | ICD-10-CM

## 2019-03-11 DIAGNOSIS — Z87891 Personal history of nicotine dependence: Secondary | ICD-10-CM

## 2019-03-11 DIAGNOSIS — M65332 Trigger finger, left middle finger: Secondary | ICD-10-CM | POA: Diagnosis not present

## 2019-03-11 DIAGNOSIS — M5030 Other cervical disc degeneration, unspecified cervical region: Secondary | ICD-10-CM | POA: Diagnosis not present

## 2019-03-11 DIAGNOSIS — M65331 Trigger finger, right middle finger: Secondary | ICD-10-CM

## 2019-03-11 DIAGNOSIS — M48061 Spinal stenosis, lumbar region without neurogenic claudication: Secondary | ICD-10-CM | POA: Diagnosis not present

## 2019-03-11 DIAGNOSIS — I10 Essential (primary) hypertension: Secondary | ICD-10-CM

## 2019-03-11 DIAGNOSIS — M19041 Primary osteoarthritis, right hand: Secondary | ICD-10-CM

## 2019-03-11 DIAGNOSIS — R768 Other specified abnormal immunological findings in serum: Secondary | ICD-10-CM

## 2019-03-11 DIAGNOSIS — M65342 Trigger finger, left ring finger: Secondary | ICD-10-CM

## 2019-03-11 DIAGNOSIS — Z8719 Personal history of other diseases of the digestive system: Secondary | ICD-10-CM

## 2019-03-11 DIAGNOSIS — E890 Postprocedural hypothyroidism: Secondary | ICD-10-CM

## 2019-03-11 DIAGNOSIS — I700 Atherosclerosis of aorta: Secondary | ICD-10-CM

## 2019-03-11 DIAGNOSIS — I480 Paroxysmal atrial fibrillation: Secondary | ICD-10-CM | POA: Diagnosis not present

## 2019-03-11 DIAGNOSIS — Z8261 Family history of arthritis: Secondary | ICD-10-CM

## 2019-03-11 DIAGNOSIS — M170 Bilateral primary osteoarthritis of knee: Secondary | ICD-10-CM | POA: Diagnosis not present

## 2019-03-11 DIAGNOSIS — I5032 Chronic diastolic (congestive) heart failure: Secondary | ICD-10-CM | POA: Diagnosis not present

## 2019-03-11 DIAGNOSIS — M65321 Trigger finger, right index finger: Secondary | ICD-10-CM

## 2019-03-12 ENCOUNTER — Other Ambulatory Visit: Payer: Self-pay | Admitting: Physician Assistant

## 2019-03-25 ENCOUNTER — Telehealth: Payer: Self-pay | Admitting: Cardiovascular Disease

## 2019-03-25 NOTE — Telephone Encounter (Signed)
Follow Up:     Pt is returning call, she does not know who called her.

## 2019-03-25 NOTE — Telephone Encounter (Signed)
I spoke with pt who states she is returning a call.  She thinks it was Lorre Nick that called her. Pt does not have any questions but states she is due for follow up with Dr. Acie Fredrickson in December.  I scheduled this for December 8,2020 at 2:00. I told pt I would send message to [facility] and she would call her back if she had any questions.

## 2019-04-14 ENCOUNTER — Other Ambulatory Visit: Payer: Self-pay | Admitting: Family Medicine

## 2019-04-16 ENCOUNTER — Telehealth: Payer: Self-pay | Admitting: Rheumatology

## 2019-04-16 NOTE — Telephone Encounter (Signed)
Patient called stating she received a letter from [facility] stating her ultrasound appointment on 14-Mar-2019 was coded incorrectly.  Patient states there is an I beside the first code [id number]) which means the patient is enrolled in hospice care which patient states is not correct.   There is an H beside the second code ($360.00) which states the information provided does not support the need for this many services in the same time frame.  Patient is requesting the codes be corrected and resent to Medicare ASAP.

## 2019-05-13 ENCOUNTER — Other Ambulatory Visit: Payer: Self-pay | Admitting: Physician Assistant

## 2019-06-03 ENCOUNTER — Encounter: Payer: Self-pay | Admitting: Cardiovascular Disease

## 2019-06-03 ENCOUNTER — Ambulatory Visit (INDEPENDENT_AMBULATORY_CARE_PROVIDER_SITE_OTHER): Payer: Medicare Other | Admitting: Cardiovascular Disease

## 2019-06-03 ENCOUNTER — Other Ambulatory Visit: Payer: Self-pay

## 2019-06-03 VITALS — BP 122/62 | HR 66 | Ht 65.0 in | Wt 177.0 lb

## 2019-06-03 DIAGNOSIS — I480 Paroxysmal atrial fibrillation: Secondary | ICD-10-CM | POA: Diagnosis not present

## 2019-06-03 DIAGNOSIS — I779 Disorder of arteries and arterioles, unspecified: Secondary | ICD-10-CM

## 2019-06-03 DIAGNOSIS — I10 Essential (primary) hypertension: Secondary | ICD-10-CM | POA: Diagnosis not present

## 2019-06-03 DIAGNOSIS — R5383 Other fatigue: Secondary | ICD-10-CM

## 2019-06-03 NOTE — Progress Notes (Signed)
Cardiology Office Note:    Date:  06/03/2019   ID:  Jaionna, Weisse 1951-06-14, MRN [id number]  PCP:  [name], MD  Cardiologist:  Mertie Moores, MD  Electrophysiologist:  None   Referring MD: [name], *   Chief Complaint  Patient presents with  . Congestive Heart Failure     History of Present Illness:    Anne [name] is a 68 y.o. female with a hx of paroxysmal atrial fibrillation, hypertension, hyperlipidemia, hypothyroidism with prior thyroid thyroidectomy.    I met her in March , 2019 in the hospital when she was admitted with rapid AFib. The Afib Spontaneously converted.  She had a mild troponin bump.     Her TSH was very low and it was thought that she may have been over replaced with her Synthroid. Sleep study in June, 2019 was negative for obstructive sleep apnea.  He was recently seen by Richardson Dopp, PA in July for further evaluation of rapid atrial fibrillation.   CHADS2-VASc=4 (age x1, female, HTN, aortic atherosclerosis).   Dec. 20, 2019 Elaynah is seen with husband, Clair Gulling   No further episodes of Afib Uses a Kardia monitor.   Husband also has Afib  Has a hissing sound in her right ear ,  Has seen ENT.   June 03, 2019:   Anne [name] is seen today for follow-up of her paroxysmal atrial fibrillation and chronic diastolic congestive heart failure. No CP or dyspnea No significant palpitations Has remained in NSR - has PAF,  Remains on Eliquis   Past Medical History:  Diagnosis Date  . Acid reflux   . Aortic atherosclerosis ([place]) 04/28/2013   CT 08/2017  . Arthritis    "joints, hands" (08/30/2017)  . Chronic lower back pain   . Disc herniation    neck and low back  . Family history of adverse reaction to anesthesia    "daughter w/PONV"  . Follicular cystitis   . Hiatal hernia   . High cholesterol   . Hyperlipidemia    ELEV CHOLESTEROL  . Hypothyroidism   . Osteopenia 2014   T score -1.6 FRAX 13%/1%  . PAF  (paroxysmal atrial fibrillation) ([place]) 08/30/2017   Echo 2/19: EF 55-60, normal wall motion, grade 1 diastolic dysfunction, mild PI, trivial TR //  Nuc 3/19: no ischemia or scar, EF 80 // CHADS2-VASc: 3 // Eliquis 5 bid   . Schatzki's ring     Past Surgical History:  Procedure Laterality Date  . BREAST BIOPSY Bilateral 1997   "excisional bx both benign"  . BREAST CYST ASPIRATION Right ~ 1993  . CATARACT EXTRACTION W/ INTRAOCULAR LENS IMPLANT Left 2011  . CATARACT EXTRACTION W/ INTRAOCULAR LENS IMPLANT Right 12/20/2012   Dr. Haynes Bast, OD  . COLONOSCOPY WITH ESOPHAGOGASTRODUODENOSCOPY (EGD)  "several"   "? dilated esophagus" (08/30/2017)  . DILATION AND CURETTAGE OF UTERUS  1996  . EYE MUSCLE SURGERY Bilateral 1992  . HERNIA REPAIR    . HYSTEROSCOPY    . LAPAROSCOPIC CHOLECYSTECTOMY  09/28/2011  . OOPHORECTOMY Right 04/2000   LAP RSO/LYSIS OF ADHESIONS  . THYROIDECTOMY  1976  . TUBAL LIGATION  1976  . VARICOSE VEIN SURGERY Bilateral 1972  . VENTRAL HERNIA REPAIR  06/23/13   Dr. Franz Dell    Current Medications: Current Meds  Medication Sig  . CALCIUM PO Take 1 tablet by mouth daily.   . Cholecalciferol (VITAMIN D) 2000 UNITS tablet Take 2,000 Units by mouth daily.    [name]  ELIQUIS 5 MG TABS tablet TAKE 1 TABLET BY MOUTH TWICE A DAY  . estradiol (ESTRACE) 0.1 MG/GM vaginal cream Place 0.25 Applicatorfuls vaginally as needed. For vaginal irritation  . meloxicam (MOBIC) 7.5 MG tablet TAKE 1 TABLET BY MOUTH EVERY DAY  . metoprolol succinate (TOPROL-XL) 50 MG 24 hr tablet TAKE 1 AND 1/2 TABS DAILY TO = 75 MG DAILY  . Multiple Vitamin (MULTIVITAMIN) tablet Take 1 tablet by mouth daily.    [name] omeprazole (PRILOSEC) 40 MG capsule TAKE 1 CAPSULE (40 MG TOTAL) BY MOUTH DAILY.  . simvastatin (ZOCOR) 40 MG tablet TAKE 1 TABLET BY MOUTH EVERY DAY  . SYNTHROID 75 MCG tablet TAKE 1 TABLET (75 MCG TOTAL) BY MOUTH DAILY BEFORE BREAKFAST.     Allergies:   Amoxicillin-pot clavulanate,  Amoxicillin-pot clavulanate, Ceftriaxone sodium, Codeine, Molds & smuts, and Zofran [ondansetron hcl]   Social History   Socioeconomic History  . Marital status: Married    Spouse name: [name]  . Number of children: 1  . Years of education: Not on file  . Highest education level: Not on file  Occupational History  . Not on file  Social Needs  . Financial resource strain: Not on file  . Food insecurity    Worry: Not on file    Inability: Not on file  . Transportation needs    Medical: Not on file    Non-medical: Not on file  Tobacco Use  . Smoking status: Former Smoker    Packs/day: 0.10    Years: 18.00    Pack years: 1.80    Types: Cigarettes    Quit date: 01/27/1979    Years since quitting: 40.3  . Smokeless tobacco: Never Used  Substance and Sexual Activity  . Alcohol use: Yes    Comment: 08/30/2017 "might have glass of wine twice/year"  . Drug use: No  . Sexual activity: Not on file  Lifestyle  . Physical activity    Days per week: Not on file    Minutes per session: Not on file  . Stress: Not on file  Relationships  . Social Herbalist on phone: Not on file    Gets together: Not on file    Attends religious service: Not on file    Active member of club or organization: Not on file    Attends meetings of clubs or organizations: Not on file    Relationship status: Not on file  Other Topics Concern  . Not on file  Social History Narrative   4 brothers and 6 sisters. All 4 brother deceased.  2 sisters deceased.  No regular exercise. Still working.       Family History: The patient's family history includes Anuerysm in her father; Arthritis in her daughter and sister; Breast cancer in her sister; Cancer in her sister; Cancer (age of onset: 33) in her brother; Heart attack (age of onset: 100) in her father; Heart disease in her father; Hyperparathyroidism in her daughter; Hypertension in her mother and sister; Leukemia in her brother; Lupus in her sister;  Melanoma in her brother; Prostate cancer in her brother; Pulmonary fibrosis in her brother; Rheum arthritis in her sister; Stroke in her sister.  ROS:   Please see the history of present illness.     All other systems reviewed and are negative.  EKGs/Labs/Other Studies Reviewed:    The following studies were reviewed today:   EKG:    Recent Labs: 08/20/2018: ALT 21 02/10/2019: BUN 10; Creat  0.82; Potassium 4.1; Sodium 140 02/19/2019: Magnesium 2.1  Recent Lipid Panel    Component Value Date/Time   CHOL 185 08/20/2018 0947   TRIG 120 08/20/2018 0947   HDL 48 (L) 08/20/2018 0947   CHOLHDL 3.9 08/20/2018 0947   VLDL 20 08/31/2017 0233   LDLCALC 114 (H) 08/20/2018 0947    Physical Exam:    Physical Exam: Blood pressure 122/62, pulse 66, height 5' 5"  (1.651 m), weight 177 lb (80.3 kg), last menstrual period 09/08/2000, SpO2 99 %.  GEN:  Well nourished, , elderly female,  HEENT: Normal NECK: No JVD; No carotid bruits LYMPHATICS: No lymphadenopathy CARDIAC: RRR , no murmurs, rubs, gallops RESPIRATORY:  Clear to auscultation without rales, wheezing or rhonchi  ABDOMEN: Soft, non-tender, non-distended MUSCULOSKELETAL:  No edema; No deformity  SKIN: Warm and dry NEUROLOGIC:  Alert and oriented x 3  EKG: June 03, 2019, normal sinus rhythm at 66.  Normal EKG.  ASSESSMENT:    1. Fatigue, unspecified type   2. Essential hypertension   3. Mild carotid artery disease (HCC)   4. Paroxysmal atrial fibrillation ([place])    PLAN:    In order of problems listed above:  1.  Paroxysmal Atrial fib:   No evidence of recurrent AF. Cont current meds. Cont Eliquis    2.  Hypothyroidism:  Managed by her primary MD    3.   Fatigue:   TSH looks good.   Will check CBC .    Medication Adjustments/Labs and Tests Ordered: Current medicines are reviewed at length with the patient today.  Concerns regarding medicines are outlined above.  Orders Placed This Encounter  Procedures  . CBC   . EKG 12-Lead   No orders of the defined types were placed in this encounter.   Patient Instructions  Medication Instructions:  Your physician recommends that you continue on your current medications as directed. Please refer to the Current Medication list given to you today.  *If you need a refill on your cardiac medications before your next appointment, please call your pharmacy*  Lab Work: TODAY - CBC  If you have labs (blood work) drawn today and your tests are completely normal, you will receive your results only by: [name] MyChart Message (if you have MyChart) OR . A paper copy in the mail If you have any lab test that is abnormal or we need to change your treatment, we will call you to review the results.   Testing/Procedures: None Ordered   Follow-Up: At [facility], you and your health needs are our priority.  As part of our continuing mission to provide you with exceptional heart care, we have created designated Provider Care Teams.  These Care Teams include your primary Cardiologist (physician) and Advanced Practice Providers (APPs -  Physician Assistants and Nurse Practitioners) who all work together to provide you with the care you need, when you need it.  Your next appointment:   1 year(s)  The format for your next appointment:   In Person  Provider:   You may see Mertie Moores, MD or one of the following Advanced Practice Providers on your designated Care Team:    Richardson Dopp, PA-C  [place], [place]  [name], [place]       Signed, Mertie Moores, MD  06/03/2019 5:38 PM    [place]

## 2019-06-03 NOTE — Patient Instructions (Signed)
Medication Instructions:  Your physician recommends that you continue on your current medications as directed. Please refer to the Current Medication list given to you today.  *If you need a refill on your cardiac medications before your next appointment, please call your pharmacy*  Lab Work: TODAY - CBC  If you have labs (blood work) drawn today and your tests are completely normal, you will receive your results only by: [name] MyChart Message (if you have MyChart) OR . A paper copy in the mail If you have any lab test that is abnormal or we need to change your treatment, we will call you to review the results.   Testing/Procedures: None Ordered   Follow-Up: At [facility], you and your health needs are our priority.  As part of our continuing mission to provide you with exceptional heart care, we have created designated Provider Care Teams.  These Care Teams include your primary Cardiologist (physician) and Advanced Practice Providers (APPs -  Physician Assistants and Nurse Practitioners) who all work together to provide you with the care you need, when you need it.  Your next appointment:   1 year(s)  The format for your next appointment:   In Person  Provider:   You may see Mertie Moores, MD or one of the following Advanced Practice Providers on your designated Care Team:    Richardson Dopp, PA-C  [place], [place]  [name], [place]

## 2019-06-04 LAB — CBC
Hematocrit: 41.6 % (ref 34.0–46.6)
Hemoglobin: 13.8 g/dL (ref 11.1–15.9)
MCH: 28.8 pg (ref 26.6–33.0)
MCHC: 33.2 g/dL (ref 31.5–35.7)
MCV: 87 fL (ref 79–97)
Platelets: 192 10*3/uL (ref 150–450)
RBC: 4.8 x10E6/uL (ref 3.77–5.28)
RDW: 13.4 % (ref 11.7–15.4)
WBC: 6 10*3/uL (ref 3.4–10.8)

## 2019-06-30 ENCOUNTER — Other Ambulatory Visit: Payer: Self-pay | Admitting: Cardiovascular Disease

## 2019-06-30 NOTE — Telephone Encounter (Signed)
Pt last saw Dr Acie Fredrickson 06/03/19, last labs 02/10/19 Creat 0.82, age 69, weight 80.3kg, based on specified criteria pt is on appropriate dosage of Eliquis 5mg  BID.  Will refill rx.

## 2019-07-21 ENCOUNTER — Other Ambulatory Visit: Payer: Self-pay | Admitting: Family Medicine

## 2019-07-21 DIAGNOSIS — E039 Hypothyroidism, unspecified: Secondary | ICD-10-CM

## 2019-07-23 ENCOUNTER — Telehealth: Payer: Self-pay | Admitting: Family Medicine

## 2019-07-23 NOTE — Telephone Encounter (Signed)
Please call pt. We refilled her mobic and forgot she is on eliquis. She really shouldn't be on a blood thinner and taking and NSAID. I would recommend she stop her Mobic and take Tylenol instead.

## 2019-07-24 NOTE — Telephone Encounter (Signed)
LVM w/recommendations and advised that she rtn call if she has questions.Elouise Munroe, [place]

## 2019-07-25 NOTE — Telephone Encounter (Signed)
Can Dariella continue to take the Mobic?

## 2019-07-25 NOTE — Telephone Encounter (Signed)
Anne [name] called back and states she was told by [place] that Dr Acie Fredrickson spoke with a pharmacist and he gave the ok for a low dose of the Mobic. So on September 19, 2018 a prescription for Mobic 15 mg to take 0.5 tablet daily as needed for pain. It was then switch to 7.5 mg to take once daily as needed for pain.      Called pt to notify her of medication recommendation. LVM informing her of RX being sent.[name], Lahoma Crocker, CMA         10:57 AM  Teddy Spike, CMA attempted to contact Jacklynn Ganong (Left Message)       8:49 AM [name], MD routed this conversation to Nahser, Wonda Cheng, MD . Teddy Spike, CMA  [name], MD     8:48 AM Note   No problem.  Thank you!  She will be excited to hear this!  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      September 18, 2018 Nahser, Wonda Cheng, MD to [name], MD     4:12 PM Note   I spoke to Anne [name] pharmacist has reviewed It will be OK for Anne [name] to take a low dose Meloxicam ( 7.5 mg a day )  and try to take it intermittantly along with her eliquis. She will watch for HTN and any bleeding   Will ask [name], MD to call in a script for meloxicam 15 mg a day and Anne [name] will take 1/2 tablet a day  ( [facility] says that the 15 mg tabs are $33 per month but the 7.5 mg tabs are $395 per months)

## 2019-07-25 NOTE — Telephone Encounter (Signed)
Your awesome Ang!!!!

## 2019-07-25 NOTE — Telephone Encounter (Signed)
Fantasha advised. She wanted me to tell Dr Madilyn Fireman "thank you for keeping up on her."

## 2019-07-25 NOTE — Telephone Encounter (Signed)
Yes, thank you.

## 2019-07-28 NOTE — Progress Notes (Signed)
Subjective:   Anne [name] is a 69 y.o. female who presents for Medicare Annual (Subsequent) preventive examination.  Review of Systems:  No ROS.  Medicare Wellness Virtual Visit.  Visual/audio telehealth visit, UTA vital signs.   See social history for additional risk factors.    Cardiac Risk Factors include: advanced age (>20men, >55 women);hypertension Sleep patterns: Getting 6 hours of sleep a night. Wakes up 1 time to void during the night. Wakes up and feels rested and other times feels sluggish.  Home Safety/Smoke Alarms: Feels safe in home. Smoke alarms in place.  Living environment; Lives with husband in a 1 story home and no stairs in or around the home. Shower is a step over tub combo and grab bars in place. Seat Belt Safety/Bike Helmet: Wears seat belt.   Female:   Pap- Aged out      Mammo- UTD       Dexa scan- ordered while in visit      CCS- UTD     Objective:     Vitals: BP (!) 125/54   Pulse 70   Ht 5\' 5"  (1.651 m)   Wt 175 lb (79.4 kg)   LMP 09/08/2000   SpO2 97%   BMI 29.12 kg/m   Body mass index is 29.12 kg/m.  Advanced Directives 08/04/2019 12/06/2017 08/30/2017 08/02/2017 01/25/2015 11/11/2014 09/29/2013  Does Patient Have a Medical Advance Directive? No No No No No No Patient does not have advance directive;Patient would like information  Would patient like information on creating a medical advance directive? No - Patient declined No - Patient declined No - Patient declined No - Patient declined No - patient declined information - Advance directive packet given    Tobacco Social History   Tobacco Use  Smoking Status Former Smoker  . Packs/day: 0.10  . Years: 18.00  . Pack years: 1.80  . Types: Cigarettes  . Quit date: 01/27/1979  . Years since quitting: 40.5  Smokeless Tobacco Never Used     Counseling given: No   Clinical Intake:  Pre-visit preparation completed: Yes  Pain : No/denies pain     Nutritional Risks: None Diabetes:  No  How often do you need to have someone help you when you read instructions, pamphlets, or other written materials from your doctor or pharmacy?: 1 - Never What is the last grade level you completed in school?: 10  Interpreter Needed?: No  Information entered by :: Orlie Dakin, LPN  Past Medical History:  Diagnosis Date  . Acid reflux   . Aortic atherosclerosis ([place]) 04/28/2013   CT 08/2017  . Arthritis    "joints, hands" (08/30/2017)  . Chronic lower back pain   . Disc herniation    neck and low back  . Family history of adverse reaction to anesthesia    "daughter w/PONV"  . Follicular cystitis   . Hiatal hernia   . High cholesterol   . Hyperlipidemia    ELEV CHOLESTEROL  . Hypothyroidism   . Osteopenia 2014   T score -1.6 FRAX 13%/1%  . PAF (paroxysmal atrial fibrillation) ([place]) 08/30/2017   Echo 2/19: EF 55-60, normal wall motion, grade 1 diastolic dysfunction, mild PI, trivial TR //  Nuc 3/19: no ischemia or scar, EF 80 // CHADS2-VASc: 3 // Eliquis 5 bid   . Schatzki's ring    Past Surgical History:  Procedure Laterality Date  . BREAST BIOPSY Bilateral 1997   "excisional bx both benign"  . BREAST CYST ASPIRATION Right ~  1993  . CATARACT EXTRACTION W/ INTRAOCULAR LENS IMPLANT Left 2011  . CATARACT EXTRACTION W/ INTRAOCULAR LENS IMPLANT Right 12/20/2012   Dr. Haynes Bast, OD  . COLONOSCOPY WITH ESOPHAGOGASTRODUODENOSCOPY (EGD)  "several"   "? dilated esophagus" (08/30/2017)  . DILATION AND CURETTAGE OF UTERUS  1996  . EYE MUSCLE SURGERY Bilateral 1992  . HERNIA REPAIR    . HYSTEROSCOPY    . LAPAROSCOPIC CHOLECYSTECTOMY  09/28/2011  . OOPHORECTOMY Right 04/2000   LAP RSO/LYSIS OF ADHESIONS  . THYROIDECTOMY  1976  . TUBAL LIGATION  1976  . VARICOSE VEIN SURGERY Bilateral 1972  . VENTRAL HERNIA REPAIR  06/23/13   Dr. Franz Dell   Family History  Problem Relation Age of Onset  . Cancer Brother 76       melanoma  . Melanoma Brother   . Prostate cancer Brother    . Leukemia Brother   . Pulmonary fibrosis Brother   . Stroke Sister   . Hypertension Sister   . Arthritis Sister   . Lupus Sister   . Cancer Sister   . Breast cancer Sister        Age 14  . Rheum arthritis Sister   . Arthritis Daughter        Rheumatoid  . Hyperparathyroidism Daughter   . Heart attack Father 100  . Heart disease Father   . Anuerysm Father   . Hypertension Mother    Social History   Socioeconomic History  . Marital status: Married    Spouse name: [name]  . Number of children: 1  . Years of education: 76  . Highest education level: GED or equivalent  Occupational History  . Occupation: Teaching laboratory technician    Comment: retired  Tobacco Use  . Smoking status: Former Smoker    Packs/day: 0.10    Years: 18.00    Pack years: 1.80    Types: Cigarettes    Quit date: 01/27/1979    Years since quitting: 40.5  . Smokeless tobacco: Never Used  Substance and Sexual Activity  . Alcohol use: Not Currently  . Drug use: No  . Sexual activity: Not Currently    Partners: Male    Birth control/protection: Post-menopausal  Other Topics Concern  . Not on file  Social History Narrative   4 brothers and 6 sisters. All 4 brother deceased.  2 sisters deceased.  No regular exercise. Still working.     Social Determinants of Health   Financial Resource Strain:   . Difficulty of Paying Living Expenses: Not on file  Food Insecurity:   . Worried About Charity fundraiser in the Last Year: Not on file  . Ran Out of Food in the Last Year: Not on file  Transportation Needs:   . Lack of Transportation (Medical): Not on file  . Lack of Transportation (Non-Medical): Not on file  Physical Activity:   . Days of Exercise per Week: Not on file  . Minutes of Exercise per Session: Not on file  Stress:   . Feeling of Stress : Not on file  Social Connections:   . Frequency of Communication with Friends and Family: Not on file  . Frequency of Social Gatherings with Friends and Family: Not  on file  . Attends Religious Services: Not on file  . Active Member of Clubs or Organizations: Not on file  . Attends Archivist Meetings: Not on file  . Marital Status: Not on file    Outpatient Encounter Medications as of  08/04/2019  Medication Sig  . CALCIUM PO Take 1 tablet by mouth daily.   . Cholecalciferol (VITAMIN D) 2000 UNITS tablet Take 2,000 Units by mouth daily.    [name] ELIQUIS 5 MG TABS tablet TAKE 1 TABLET BY MOUTH TWICE A DAY  . estradiol (ESTRACE) 0.1 MG/GM vaginal cream Place [id number] Applicatorfuls vaginally as needed. For vaginal irritation  . meloxicam (MOBIC) 7.5 MG tablet TAKE 1 TABLET BY MOUTH EVERY DAY  . metoprolol succinate (TOPROL-XL) 50 MG 24 hr tablet TAKE 1 AND 1/2 TABS DAILY TO = 75 MG DAILY  . Multiple Vitamin (MULTIVITAMIN) tablet Take 1 tablet by mouth daily.    [name] omeprazole (PRILOSEC) 40 MG capsule TAKE 1 CAPSULE (40 MG TOTAL) BY MOUTH DAILY.  . simvastatin (ZOCOR) 40 MG tablet TAKE 1 TABLET BY MOUTH EVERY DAY  . SYNTHROID 75 MCG tablet TAKE 1 TABLET (75 MCG TOTAL) BY MOUTH DAILY BEFORE BREAKFAST.   No facility-administered encounter medications on file as of 08/04/2019.    Activities of Daily Living In your present state of health, do you have any difficulty performing the following activities: 08/04/2019  Hearing? N  Vision? N  Difficulty concentrating or making decisions? N  Walking or climbing stairs? Y  Comment going up stairs causes left knee pain  Dressing or bathing? N  Doing errands, shopping? N  Preparing Food and eating ? N  Using the Toilet? N  In the past six months, have you accidently leaked urine? N  Do you have problems with loss of bowel control? N  Managing your Medications? N  Housekeeping or managing your Housekeeping? N  Some recent data might be hidden    Patient Care Team: [name], MD as PCP - General (Family Medicine) Nahser, Wonda Cheng, MD as PCP - Cardiology (Cardiology) Gwendel Hanson, MD as  Referring Physician (Urology) Phineas Real Belinda Block, MD as Consulting Physician (Gynecology) Jyl Heinz, MD as Referring Physician (Endocrinology)    Assessment:   This is a routine wellness examination for Adel.Physical assessment deferred to PCP.   Exercise Activities and Dietary recommendations Current Exercise Habits: Home exercise routine, Type of exercise: walking, Time (Minutes): 45, Frequency (Times/Week): 5, Weekly Exercise (Minutes/Week): 225, Intensity: Mild, Exercise limited by: None identified Diet Eats a healthy diet of vegetables, fruits and proteins. Eats a low sugar diet  Breakfast: Toast and raisens, cereal Lunch: salad Dinner: Meat and vegetable, chicken does not eat a lot of red meat. Drinks 6 bottles of water daily.      Goals    . Weight (lb) < 200 lb (90.7 kg)     Patient states would ike to loose 20 lbs.       Fall Risk Fall Risk  08/04/2019 02/19/2019 04/22/2018 02/08/2017 02/04/2016  Falls in the past year? 0 0 No No No  Number falls in past yr: - 0 - - -  Injury with Fall? - 0 - - -  Risk for fall due to : No Fall Risks - - - -  Follow up Falls prevention discussed - - - -   Is the patient's home free of loose throw rugs in walkways, pet beds, electrical cords, etc?   yes      Grab bars in the bathroom? yes      Handrails on the stairs?   no      Adequate lighting?   yes   Depression Screen PHQ 2/9 Scores 08/04/2019 02/19/2019 04/22/2018 09/06/2017  PHQ - 2 Score 0  0 0 0  PHQ- 9 Score - - - 0     Cognitive Function     6CIT Screen 08/04/2019 02/08/2017  What Year? 0 points 0 points  What month? 0 points 0 points  What time? 0 points 0 points  Count back from 20 0 points 0 points  Months in reverse 0 points 0 points  Repeat phrase 0 points 0 points  Total Score 0 0    Immunization History  Administered Date(s) Administered  . Influenza Split 06/02/2011, 05/03/2012  . Influenza Whole 04/16/2009  . Influenza,inj,Quad PF,6+ Mos  05/01/2013, 04/17/2014  . Pneumococcal Conjugate-13 10/17/2013  . Pneumococcal Polysaccharide-23 02/04/2016  . Td 01/21/2010  . Zoster 08/18/2011    Screening Tests Health Maintenance  Topic Date Due  . DEXA SCAN  05/30/2018  . INFLUENZA VACCINE  01/25/2019  . TETANUS/TDAP  01/22/2020  . MAMMOGRAM  02/23/2021  . COLONOSCOPY  02/09/2023  . Hepatitis C Screening  Completed  . PNA vac Low Risk Adult  Completed       Plan:    Please schedule your next medicare wellness visit with me in 1 yr.  Ms. Arts , Thank you for taking time to come for your Medicare Wellness Visit. I appreciate your ongoing commitment to your health goals. Please review the following plan we discussed and let me know if I can assist you in the future.  Continue doing brain stimulating activities (puzzles, reading, adult coloring books, staying active) to keep memory sharp.    These are the goals we discussed: Goals    . Weight (lb) < 200 lb (90.7 kg)     Patient states would ike to loose 20 lbs.       This is a list of the screening recommended for you and due dates:  Health Maintenance  Topic Date Due  . DEXA scan (bone density measurement)  05/30/2018  . Flu Shot  01/25/2019  . Tetanus Vaccine  01/22/2020  . Mammogram  02/23/2021  . Colon Cancer Screening  02/09/2023  .  Hepatitis C: One time screening is recommended by Center for Disease Control  (CDC) for  adults born from [birth year] through 1967.   Completed  . Pneumonia vaccines  Completed     These are the goals we discussed: Goals    . Weight (lb) < 200 lb (90.7 kg)     Patient states would ike to loose 20 lbs.       This is a list of the screening recommended for you and due dates:  Health Maintenance  Topic Date Due  . DEXA scan (bone density measurement)  05/30/2018  . Flu Shot  01/25/2019  . Tetanus Vaccine  01/22/2020  . Mammogram  02/23/2021  . Colon Cancer Screening  02/09/2023  .  Hepatitis C: One time screening is  recommended by Center for Disease Control  (CDC) for  adults born from [birth year] through 1967.   Completed  . Pneumonia vaccines  Completed      I have personally reviewed and noted the following in the patient's chart:   . Medical and social history . Use of alcohol, tobacco or illicit drugs  . Current medications and supplements . Functional ability and status . Nutritional status . Physical activity . Advanced directives . List of other physicians . Hospitalizations, surgeries, and ER visits in previous 12 months . Vitals . Screenings to include cognitive, depression, and falls . Referrals and appointments  In addition, I have reviewed and  discussed with patient certain preventive protocols, quality metrics, and best practice recommendations. A written personalized care plan for preventive services as well as general preventive health recommendations were provided to patient.     Joanne Chars, LPN  [id number]

## 2019-08-04 ENCOUNTER — Ambulatory Visit (INDEPENDENT_AMBULATORY_CARE_PROVIDER_SITE_OTHER): Payer: Medicare Other | Admitting: *Deleted

## 2019-08-04 VITALS — BP 125/54 | HR 70 | Ht 65.0 in | Wt 175.0 lb

## 2019-08-04 DIAGNOSIS — Z13820 Encounter for screening for osteoporosis: Secondary | ICD-10-CM | POA: Diagnosis not present

## 2019-08-04 DIAGNOSIS — Z0000 Encounter for general adult medical examination without abnormal findings: Secondary | ICD-10-CM

## 2019-08-04 DIAGNOSIS — Z780 Asymptomatic menopausal state: Secondary | ICD-10-CM | POA: Diagnosis not present

## 2019-08-04 NOTE — Patient Instructions (Signed)
Please schedule your next medicare wellness visit with me in 1 yr.  Anne [name] , Thank you for taking time to come for your Medicare Wellness Visit. I appreciate your ongoing commitment to your health goals. Please review the following plan we discussed and let me know if I can assist you in the future.  Continue doing brain stimulating activities (puzzles, reading, adult coloring books, staying active) to keep memory  Please schedule your next medicare wellness visit with me in 1 yr.  These are the goals we discussed: Goals    . Weight (lb) < 200 lb (90.7 kg)     Patient states would ike to loose 20 lbs.

## 2019-08-06 ENCOUNTER — Ambulatory Visit (INDEPENDENT_AMBULATORY_CARE_PROVIDER_SITE_OTHER): Payer: Medicare Other

## 2019-08-06 ENCOUNTER — Other Ambulatory Visit: Payer: Self-pay

## 2019-08-06 DIAGNOSIS — Z780 Asymptomatic menopausal state: Secondary | ICD-10-CM

## 2019-08-06 DIAGNOSIS — Z13820 Encounter for screening for osteoporosis: Secondary | ICD-10-CM

## 2019-08-06 DIAGNOSIS — M8589 Other specified disorders of bone density and structure, multiple sites: Secondary | ICD-10-CM | POA: Diagnosis not present

## 2019-08-25 ENCOUNTER — Encounter: Payer: Self-pay | Admitting: Family Medicine

## 2019-08-25 ENCOUNTER — Ambulatory Visit (INDEPENDENT_AMBULATORY_CARE_PROVIDER_SITE_OTHER): Payer: Medicare Other | Admitting: Family Medicine

## 2019-08-25 ENCOUNTER — Other Ambulatory Visit: Payer: Self-pay

## 2019-08-25 VITALS — BP 121/52 | HR 75 | Ht 65.0 in | Wt 175.0 lb

## 2019-08-25 DIAGNOSIS — E890 Postprocedural hypothyroidism: Secondary | ICD-10-CM

## 2019-08-25 DIAGNOSIS — I10 Essential (primary) hypertension: Secondary | ICD-10-CM | POA: Diagnosis not present

## 2019-08-25 DIAGNOSIS — M5412 Radiculopathy, cervical region: Secondary | ICD-10-CM | POA: Diagnosis not present

## 2019-08-25 DIAGNOSIS — M542 Cervicalgia: Secondary | ICD-10-CM

## 2019-08-25 LAB — COMPLETE METABOLIC PANEL WITH GFR
AG Ratio: 1.8 (calc) (ref 1.0–2.5)
ALT: 17 U/L (ref 6–29)
AST: 18 U/L (ref 10–35)
Albumin: 4.4 g/dL (ref 3.6–5.1)
Alkaline phosphatase (APISO): 70 U/L (ref 37–153)
BUN: 12 mg/dL (ref 7–25)
CO2: 28 mmol/L (ref 20–32)
Calcium: 9.4 mg/dL (ref 8.6–10.4)
Chloride: 105 mmol/L (ref 98–110)
Creat: 0.9 mg/dL (ref 0.60–0.93)
GFR, Est African American: 75 mL/min/{1.73_m2} (ref >=60)
GFR, Est Non African American: 65 mL/min/{1.73_m2} (ref >=60)
Globulin: 2.4 g/dL (calc) (ref 1.9–3.7)
Glucose, Bld: 119 mg/dL — ABNORMAL HIGH (ref 65–99)
Potassium: 4.5 mmol/L (ref 3.5–5.3)
Sodium: 138 mmol/L (ref 135–146)
Total Bilirubin: 0.7 mg/dL (ref 0.2–1.2)
Total Protein: 6.8 g/dL (ref 6.1–8.1)

## 2019-08-25 LAB — LIPID PANEL
Cholesterol: 146 mg/dL (ref <200)
HDL: 43 mg/dL — ABNORMAL LOW (ref >=50)
LDL Cholesterol (Calc): 81 mg/dL (calc)
Non-HDL Cholesterol (Calc): 103 mg/dL (calc) (ref <130)
Total CHOL/HDL Ratio: 3.4 (calc) (ref <5.0)
Triglycerides: 120 mg/dL (ref <150)

## 2019-08-25 LAB — TSH: TSH: 0.46 mIU/L (ref 0.40–4.50)

## 2019-08-25 LAB — T3, FREE: T3, Free: 3.1 pg/mL (ref 2.3–4.2)

## 2019-08-25 LAB — T4, FREE: Free T4: 1.3 ng/dL (ref 0.8–1.8)

## 2019-08-25 NOTE — Assessment & Plan Note (Addendum)
Due to recheck TSH.  F/u in 6 months.

## 2019-08-25 NOTE — Progress Notes (Signed)
Established Patient Office Visit  Subjective:  Patient ID: Anne [name], female    DOB: Jul 14, 1950  Age: 69 y.o. MRN: [id number]  CC:  Chief Complaint  Patient presents with  . Hypothyroidism    HPI [name] presents for   Hypertension- Pt denies chest pain, SOB, dizziness, or heart palpitations.  Taking meds as directed w/o problems.  Denies medication side effects.    Hypothyroidism - Taking medication regularly in the AM away from food and vitamins, etc. No recent change to skin, hair, or energy levels.  He also complains of numbness and tingling down both arms.  She had a similar complaint back in October 2019 which started initially in her neck and occasionally would radiate up into her face and then down into her arms.  We did x-ray imaging at that point which indicated some foraminal narrowing and anterolisthesis.  Recommendation was for formal physical therapy.   Past Medical History:  Diagnosis Date  . Acid reflux   . Aortic atherosclerosis ([place]) 04/28/2013   CT 08/2017  . Arthritis    "joints, hands" (08/30/2017)  . Chronic lower back pain   . Disc herniation    neck and low back  . Family history of adverse reaction to anesthesia    "daughter w/PONV"  . Follicular cystitis   . Hiatal hernia   . High cholesterol   . Hyperlipidemia    ELEV CHOLESTEROL  . Hypothyroidism   . Osteopenia 2014   T score -1.6 FRAX 13%/1%  . PAF (paroxysmal atrial fibrillation) ([place]) 08/30/2017   Echo 2/19: EF 55-60, normal wall motion, grade 1 diastolic dysfunction, mild PI, trivial TR //  Nuc 3/19: no ischemia or scar, EF 80 // CHADS2-VASc: 3 // Eliquis 5 bid   . Schatzki's ring     Past Surgical History:  Procedure Laterality Date  . BREAST BIOPSY Bilateral 1997   "excisional bx both benign"  . BREAST CYST ASPIRATION Right ~ 1993  . CATARACT EXTRACTION W/ INTRAOCULAR LENS IMPLANT Left 2011  . CATARACT EXTRACTION W/ INTRAOCULAR LENS IMPLANT Right 12/20/2012    Dr. Haynes Bast, OD  . COLONOSCOPY WITH ESOPHAGOGASTRODUODENOSCOPY (EGD)  "several"   "? dilated esophagus" (08/30/2017)  . DILATION AND CURETTAGE OF UTERUS  1996  . EYE MUSCLE SURGERY Bilateral 1992  . HERNIA REPAIR    . HYSTEROSCOPY    . LAPAROSCOPIC CHOLECYSTECTOMY  09/28/2011  . OOPHORECTOMY Right 04/2000   LAP RSO/LYSIS OF ADHESIONS  . THYROIDECTOMY  1976  . TUBAL LIGATION  1976  . VARICOSE VEIN SURGERY Bilateral 1972  . VENTRAL HERNIA REPAIR  06/23/13   Dr. Franz Dell    Family History  Problem Relation Age of Onset  . Cancer Brother 29       melanoma  . Melanoma Brother   . Prostate cancer Brother   . Leukemia Brother   . Pulmonary fibrosis Brother   . Stroke Sister   . Hypertension Sister   . Arthritis Sister   . Lupus Sister   . Cancer Sister   . Breast cancer Sister        Age 42  . Rheum arthritis Sister   . Arthritis Daughter        Rheumatoid  . Hyperparathyroidism Daughter   . Heart attack Father 40  . Heart disease Father   . Anuerysm Father   . Hypertension Mother     Social History   Socioeconomic History  . Marital status: Married  Spouse name: [name]  . Number of children: 1  . Years of education: 43  . Highest education level: GED or equivalent  Occupational History  . Occupation: Teaching laboratory technician    Comment: retired  Tobacco Use  . Smoking status: Former Smoker    Packs/day: 0.10    Years: 18.00    Pack years: 1.80    Types: Cigarettes    Quit date: 01/27/1979    Years since quitting: 40.6  . Smokeless tobacco: Never Used  Substance and Sexual Activity  . Alcohol use: Not Currently  . Drug use: No  . Sexual activity: Not Currently    Partners: Male    Birth control/protection: Post-menopausal  Other Topics Concern  . Not on file  Social History Narrative   4 brothers and 6 sisters. All 4 brother deceased.  2 sisters deceased.  No regular exercise. Still working.     Social Determinants of Health   Financial Resource  Strain:   . Difficulty of Paying Living Expenses: Not on file  Food Insecurity:   . Worried About Charity fundraiser in the Last Year: Not on file  . Ran Out of Food in the Last Year: Not on file  Transportation Needs:   . Lack of Transportation (Medical): Not on file  . Lack of Transportation (Non-Medical): Not on file  Physical Activity:   . Days of Exercise per Week: Not on file  . Minutes of Exercise per Session: Not on file  Stress:   . Feeling of Stress : Not on file  Social Connections:   . Frequency of Communication with Friends and Family: Not on file  . Frequency of Social Gatherings with Friends and Family: Not on file  . Attends Religious Services: Not on file  . Active Member of Clubs or Organizations: Not on file  . Attends Archivist Meetings: Not on file  . Marital Status: Not on file  Intimate Partner Violence:   . Fear of Current or Ex-Partner: Not on file  . Emotionally Abused: Not on file  . Physically Abused: Not on file  . Sexually Abused: Not on file    Outpatient Medications Prior to Visit  Medication Sig Dispense Refill  . CALCIUM PO Take 1 tablet by mouth daily.     . Cholecalciferol (VITAMIN D) 2000 UNITS tablet Take 2,000 Units by mouth daily.      [name] ELIQUIS 5 MG TABS tablet TAKE 1 TABLET BY MOUTH TWICE A DAY 180 tablet 1  . estradiol (ESTRACE) 0.1 MG/GM vaginal cream Place [id number] Applicatorfuls vaginally as needed. For vaginal irritation 42.5 g 2  . meloxicam (MOBIC) 7.5 MG tablet TAKE 1 TABLET BY MOUTH EVERY DAY 90 tablet 1  . metoprolol succinate (TOPROL-XL) 50 MG 24 hr tablet TAKE 1 AND 1/2 TABS DAILY TO = 75 MG DAILY 135 tablet 3  . Multiple Vitamin (MULTIVITAMIN) tablet Take 1 tablet by mouth daily.      [name] omeprazole (PRILOSEC) 40 MG capsule TAKE 1 CAPSULE (40 MG TOTAL) BY MOUTH DAILY. 90 capsule 1  . simvastatin (ZOCOR) 40 MG tablet TAKE 1 TABLET BY MOUTH EVERY DAY 90 tablet 3  . SYNTHROID 75 MCG tablet TAKE 1 TABLET (75 MCG TOTAL) BY  MOUTH DAILY BEFORE BREAKFAST. 90 tablet 2   No facility-administered medications prior to visit.    Allergies  Allergen Reactions  . Amoxicillin-Pot Clavulanate     REACTION: blisters in colon  . Amoxicillin-Pot Clavulanate Other (See Comments)  Colon blisters Blistered colon  . Ceftriaxone Sodium     REACTION: throat swells (ROCEPHIN)  . Codeine     REACTION: nausea  . Molds & Smuts   . Zofran [Ondansetron Hcl]     Headaches    ROS Review of Systems    Objective:    Physical Exam  Constitutional: She is oriented to person, place, and time. She appears well-developed and well-nourished.  HENT:  Head: Normocephalic and atraumatic.  Cardiovascular: Normal rate, regular rhythm and normal heart sounds.  Pulmonary/Chest: Effort normal and breath sounds normal.  Musculoskeletal:     Comments: Normal cervical flexion.  Decreased extension.  Slightly decreased rotation to the right compared to the left.  Nontender cervical spine.  Does have a palpable lump just in the upper cervical area to the left.  Neurological: She is alert and oriented to person, place, and time.  Skin: Skin is warm and dry.  Psychiatric: She has a normal mood and affect. Her behavior is normal.    BP (!) 121/52   Pulse 75   Ht 5\' 5"  (1.651 m)   Wt 175 lb (79.4 kg)   LMP 09/08/2000   SpO2 100%   BMI 29.12 kg/m  Wt Readings from Last 3 Encounters:  08/25/19 175 lb (79.4 kg)  08/04/19 175 lb (79.4 kg)  06/03/19 177 lb (80.3 kg)     There are no preventive care reminders to display for this patient.  There are no preventive care reminders to display for this patient.  Lab Results  Component Value Date   TSH 1.190 09/25/2017   Lab Results  Component Value Date   WBC 6.0 06/03/2019   HGB 13.8 06/03/2019   HCT 41.6 06/03/2019   MCV 87 06/03/2019   PLT 192 06/03/2019   Lab Results  Component Value Date   NA 140 02/10/2019   K 4.1 02/10/2019   CO2 28 02/10/2019   GLUCOSE 111 (H)  02/10/2019   BUN 10 02/10/2019   CREATININE 0.82 02/10/2019   BILITOT 0.7 08/20/2018   ALKPHOS 81 08/30/2017   AST 19 08/20/2018   ALT 21 08/20/2018   PROT 7.0 08/20/2018   ALBUMIN 4.2 08/30/2017   CALCIUM 9.3 02/10/2019   ANIONGAP 13 08/30/2017   Lab Results  Component Value Date   CHOL 185 08/20/2018   Lab Results  Component Value Date   HDL 48 (L) 08/20/2018   Lab Results  Component Value Date   LDLCALC 114 (H) 08/20/2018   Lab Results  Component Value Date   TRIG 120 08/20/2018   Lab Results  Component Value Date   CHOLHDL 3.9 08/20/2018   Lab Results  Component Value Date   HGBA1C 5.3 02/19/2019      Assessment & Plan:   Problem List Items Addressed This Visit      Cardiovascular and Mediastinum   Essential hypertension - Primary    Well controlled. Continue current regimen. Follow up in  6 mo      Relevant Orders   COMPLETE METABOLIC PANEL WITH GFR   Lipid panel   TSH     Endocrine   Postsurgical hypothyroidism    Due to recheck TSH.  F/u in 6 months.        Relevant Orders   TSH   T3, free   T4, free     Nervous and Auditory   Cervical radiculopathy    Waking up at night with numbness going into her arms and face.  She  gets relief after she sits up and it seems to occur only at night.  I think that this is likely a pinched nerve that is likely just related from her cervical spine.  X-ray from October 2019 confirmed some foraminal stenosis as well as some anterior listhesis.  At the time she never started physical therapy because that is when she was diagnosed with A. fib.  We will go ahead and place referral today.  If not improving she will follow back up and let us know and we can consider additional imaging etc. if needed at that time.      Relevant Orders   Ambulatory referral to Physical Therapy    Other Visit Diagnoses    Cervical pain       Relevant Orders   Ambulatory referral to Physical Therapy      No orders of the defined  types were placed in this encounter.   Follow-up: Return in about 6 weeks (around 10/06/2019) for F/U on neck and numbness.    Beatrice Lecher, MD

## 2019-08-25 NOTE — Assessment & Plan Note (Signed)
Waking up at night with numbness going into her arms and face.  She gets relief after she sits up and it seems to occur only at night.  I think that this is likely a pinched nerve that is likely just related from her cervical spine.  X-ray from October 2019 confirmed some foraminal stenosis as well as some anterior listhesis.  At the time she never started physical therapy because that is when she was diagnosed with A. fib.  We will go ahead and place referral today.  If not improving she will follow back up and let us know and we can consider additional imaging etc. if needed at that time.

## 2019-08-25 NOTE — Assessment & Plan Note (Signed)
Well controlled. Continue current regimen. Follow up in  6 mo  

## 2019-08-27 ENCOUNTER — Other Ambulatory Visit: Payer: Self-pay

## 2019-08-27 ENCOUNTER — Ambulatory Visit (INDEPENDENT_AMBULATORY_CARE_PROVIDER_SITE_OTHER): Payer: Medicare Other | Admitting: Rehabilitative and Restorative Service Providers"

## 2019-08-27 DIAGNOSIS — R293 Abnormal posture: Secondary | ICD-10-CM

## 2019-08-27 DIAGNOSIS — R29818 Other symptoms and signs involving the nervous system: Secondary | ICD-10-CM | POA: Diagnosis not present

## 2019-08-27 DIAGNOSIS — M542 Cervicalgia: Secondary | ICD-10-CM

## 2019-08-27 NOTE — Patient Instructions (Signed)
Access Code: [email or web address]  URL: [email or web address]  Date: 08/27/2019  Prepared by: Rudell Cobb   Exercises Standing Scapular Retraction - 10 reps - 1 sets - 2x daily - 7x weekly

## 2019-08-27 NOTE — Therapy (Signed)
[facility]  [address] [place], [place], [address] Phone: [phone]   Fax:  [phone]  Physical Therapy Evaluation  Patient Details  Name: Anne [name] MRN: [id number] Date of Birth: [birth year] Referring Provider (PT): Beatrice Lecher, MD   Encounter Date: 08/27/2019  PT End of Session - 08/27/19 2042    Visit Number  1    Number of Visits  12    Date for PT Re-Evaluation  10/08/19    PT Start Time  1020    PT Stop Time  1100    PT Time Calculation (min)  40 min    Activity Tolerance  Patient tolerated treatment well;Patient limited by pain    Behavior During Therapy  [facility] for tasks assessed/performed       Past Medical History:  Diagnosis Date  . Acid reflux   . Aortic atherosclerosis ([place]) 04/28/2013   CT 08/2017  . Arthritis    "joints, hands" (08/30/2017)  . Chronic lower back pain   . Disc herniation    neck and low back  . Family history of adverse reaction to anesthesia    "daughter w/PONV"  . Follicular cystitis   . Hiatal hernia   . High cholesterol   . Hyperlipidemia    ELEV CHOLESTEROL  . Hypothyroidism   . Osteopenia 2014   T score -1.6 FRAX 13%/1%  . PAF (paroxysmal atrial fibrillation) ([place]) 08/30/2017   Echo 2/19: EF 55-60, normal wall motion, grade 1 diastolic dysfunction, mild PI, trivial TR //  Nuc 3/19: no ischemia or scar, EF 80 // CHADS2-VASc: 3 // Eliquis 5 bid   . Schatzki's ring     Past Surgical History:  Procedure Laterality Date  . BREAST BIOPSY Bilateral 1997   "excisional bx both benign"  . BREAST CYST ASPIRATION Right ~ 1993  . CATARACT EXTRACTION W/ INTRAOCULAR LENS IMPLANT Left 2011  . CATARACT EXTRACTION W/ INTRAOCULAR LENS IMPLANT Right 12/20/2012   Dr. Haynes Bast, OD  . COLONOSCOPY WITH ESOPHAGOGASTRODUODENOSCOPY (EGD)  "several"   "? dilated esophagus" (08/30/2017)  . DILATION AND CURETTAGE OF UTERUS  1996  . EYE MUSCLE SURGERY Bilateral 1992  .  HERNIA REPAIR    . HYSTEROSCOPY    . LAPAROSCOPIC CHOLECYSTECTOMY  09/28/2011  . OOPHORECTOMY Right 04/2000   LAP RSO/LYSIS OF ADHESIONS  . THYROIDECTOMY  1976  . TUBAL LIGATION  1976  . VARICOSE VEIN SURGERY Bilateral 1972  . VENTRAL HERNIA REPAIR  06/23/13   Dr. Franz Dell    There were no vitals filed for this visit.   Subjective Assessment - 08/27/19 1024    Subjective  The patient reports that she had onset of neck pain approximately 18 months ago.  She did not seek treatment for it at that time due to new diagnosis of A-fib and needing to attend to that diagnosis.  She felt pain slowly improved.  A few weeks ago, she noted waking with tingling in the posterior aspect of her neck with tingling into her arms.  She sleeps on her side and wakes up on her back. The tingling stops within a couple of hours of moving to the chair from the bed.   This occurs nightly with patient sleeping a couple of hours before waking with tingling.  In addition to these sensations, she also reports presence of floaters in her eyes (with eyes open or closed).  She denies h/o migraines.  She denies difficulties with speech during these episodes.  Diagnostic tests  From 03/2018  IMPRESSION:1. 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 Slight anterolisthesis of C3 on C4 and C4 on C5 by 2 mm mostlikely degenerative in origin.3. Mild foraminal narrowing at C3-4 and C6-7.    Patient Stated Goals  Sleep through the night.    Currently in Pain?  Yes    Pain Location  Neck    Pain Orientation  Medial    Pain Descriptors / Indicators  Tingling   tension in her lower neck   Pain Radiating Towards  shoulders and into hands    Pain Onset  More than a month ago    Pain Frequency  Intermittent    Aggravating Factors   sleeping, jaws and eyes/lips can feel numb    Pain Relieving Factors  getting out of bed and sitting up         [facility] PT Assessment - 08/27/19 1028      Assessment   Medical Diagnosis  cervical pain,  cervical radiculopathy    Referring Provider (PT)  Beatrice Lecher, MD    Onset Date/Surgical Date  08/25/19    Hand Dominance  Right    Prior Therapy  none      Precautions   Precautions  Other (comment)    Precaution Comments  PRECAUTIONS:  patient notes difficulty swallowing from thickening saliva (could this be cervical dysphagia due to c-spine ligamentous weakness?), patient has abnormal smooth pursuits (may be attributed to [place] of graves opthalmopathy?)      Restrictions   Weight Bearing Restrictions  No      Balance Screen   Has the patient fallen in the past 6 months  No    Has the patient had a decrease in activity level because of a fear of falling?   No    Is the patient reluctant to leave their home because of a fear of falling?   No      Home Environment   Living Environment  Private residence    Living Arrangements  Spouse/significant other    Type of [place]  One level      Prior Function   Level of Independence  Independent      Observation/Other Assessments   Focus on Therapeutic Outcomes (FOTO)   62% (38% limited)      Sensation   Light Touch  Impaired Detail    Light Touch Impaired Details  Impaired RUE;Impaired LUE   intermittent tingling in bilateral UEs to hands     ROM / Strength   AROM / PROM / Strength  AROM;Strength      AROM   Overall AROM   Deficits    AROM Assessment Site  Cervical;Shoulder    Right/Left Shoulder  Right;Left   bilateral shoulder AROM is WNLs   Cervical Flexion  30    Cervical Extension  30   feels like bones are "rubbing" with this position   Cervical - Right Side Bend  22    Cervical - Left Side Bend  15    Cervical - Right Rotation  45   in supine   Cervical - Left Rotation  30   supine with pain     Strength   Overall Strength Comments  UE strength:  5/5 shoulder flexion/abduction, bilateral elbow flexion.  Bilateral LE strength 4/5, bilateral knee flexion/extension 5/5, ankle DF 5/5       Palpation   Spinal mobility  hypomobility cervicothoracic junction, hypomobility PA T1,  T2, tightness L paraspinals, L scalenes, upper trapezius L hypertrophy    Palpation comment  tenderness to palpation cervicothoracic junction and scalenes           Vestibular Assessment - 08/27/19 1029      Vestibular Assessment   General Observation  The patient notes at onset she heard a tick in her ear and she now has a "hissing" in her right ear (had assessed by ENT 1.5 years ago).        Oculomotor Exam   Oculomotor Alignment  Normal    Smooth Pursuits  --   abnormal finding:  midline to R gaze, L eye adducts   Comment  Asymmetry noted with smooth pursuits to the right side.  Patient has "natural monovision" reporting prior assessment from opthalmologist.            Objective measurements completed on examination: See above findings.      [facility] PT Treatment/Exercise - 08/27/19 1056      Exercises   Exercises  Neck      Neck Exercises: Standing   Neck Retraction  5 reps    Neck Retraction Limitations  painful in standing    Other Standing Exercises  scapular retraction      Neck Exercises: Supine   Neck Retraction  5 reps    Neck Retraction Limitations  pain rates 3/10 after exercise, did not add for HEP    Cervical Rotation  Right;Left    Cervical Rotation Limitations  painful       Neck Exercises: Prone   Axial Exension  5 reps    Other Prone Exercise  prone on elbows             PT Education - 08/27/19 2041    Education Details  HEP initiated    Person(s) Educated  Patient    Methods  Explanation;Demonstration;Handout    Comprehension  Verbalized understanding;Returned demonstration          PT Long Term Goals - 08/27/19 2058      PT LONG TERM GOAL #1   Title  The patient will be indep with HEP for neck stabilization, strengthening and flexibility.    Time  6    Period  Weeks    Target Date  10/08/19      PT LONG TERM GOAL #2   Title  The  patient will reduce functional limitation per FOTO from 38% to < or equal to 30%.    Time  6    Period  Weeks    Target Date  10/08/19      PT LONG TERM GOAL #3   Title  The patient will report waking with bilateral arm/hand numbness < 1x/week to reduce frequency of sensory changes and less sleep disturbance.    Time  6    Period  Weeks    Target Date  10/08/19      PT LONG TERM GOAL #4   Title  The patient will demonstrate sleeping positions to demonstrate neutral spine.    Time  6    Period  Weeks    Target Date  10/08/19      PT LONG TERM GOAL #5   Title  The patient will improve L rotation supine to > or equal to 45 degrees.    Baseline  30 degrees    Time  6    Period  Weeks    Target Date  10/08/19  Plan - 08/27/19 2101    Clinical Impression Statement  The patient is a 69 yo female with 18 month h/o neck pain with worsening sensation of numbness and tingling in her UEs into her hands while sleeping (night time or during a nap).  The patient also noes other symptoms associated with these episodes of visual floaters, being able to hear her eye movement, occasional tinnitus.  She presents to PT with impairments of decreased neck rotation and sidebending to the left, pain with movement, postural rounding through shoulders, hypomobility of the spine, and sensory changes at night.  Impairments are leading to sleep disturbances.  The patient notes further tightness in anterior neck musculature with retraction therefore not provided with HEP.    Personal Factors and Comorbidities  Comorbidity 1;Comorbidity 2    Comorbidities  graves disease, a-fib    Examination-Activity Limitations  Sleep    Stability/Clinical Decision Making  Stable/Uncomplicated    Clinical Decision Making  Low    Rehab Potential  Good    PT Frequency  2x / week    PT Duration  6 weeks    PT Treatment/Interventions  ADLs/Self Care Home Management;Iontophoresis 4mg /ml Dexamethasone;Moist  Heat;Ultrasound;Electrical Stimulation;Neuromuscular re-education;Therapeutic exercise;Therapeutic activities;Patient/family education;Taping;Dry needling;Manual techniques    PT Next Visit Plan  work on cervical retraction (reducing firing of SCM), postural stretching (thoracic opening on towel roll), scapular retraction strengthening  PRECAUTIONS:  patient notes difficulty swallowing from thickening saliva (could this be cervical dysphagia due to c-spine ligamentous weakness?), patient has abnormal smooth pursuits (may be attributed to [place] of graves opthalmopathy?)    PT Home Exercise Plan  Access Code: [email or web address]    Consulted and Agree with Plan of Care  Patient       Patient will benefit from skilled therapeutic intervention in order to improve the following deficits and impairments:  Decreased range of motion, Impaired sensation, Postural dysfunction, Hypomobility, Impaired flexibility, Pain  Visit Diagnosis: Cervicalgia  Abnormal posture  Other symptoms and signs involving the nervous system     Problem List Patient Active Problem List   Diagnosis Date Noted  . Cervical radiculopathy 08/25/2019  . History of esophagogastroduodenoscopy (EGD) 02/08/2018  . Colon cancer screening 02/08/2018  . Essential hypertension 12/25/2017  . GERD (gastroesophageal reflux disease) 08/30/2017  . Chronic diastolic (congestive) heart failure ([place]) 08/30/2017  . Paroxysmal atrial fibrillation ([place]) 08/30/2017  . [place] 07/30/2017  . Paresthesia of both hands 07/30/2017  . 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 Aortic atherosclerosis ([place]) 04/28/2013  . S/P cataract extraction 01/16/2013  . Pseudophakia of both eyes 12/20/2012  . Schatzki's ring 10/16/2012  . Spinal stenosis of lumbar region 08/16/2012  . Vaginal atrophy 11/14/2011  . Graves' disease 05/05/2011  . Disc herniation   . Osteopenia   . PERSONAL HISTORY OF ALLERGY TO LATEX 12/23/2009  . UNSPECIFIED DISEASE OF PHARYNX  12/16/2008  . FATIGUE 11/10/2008  . Intermittent palpitations 11/10/2008  . Hypothyroidism 09/22/2008  . Hyperlipidemia 09/22/2008  . ALLERGIC RHINITIS 09/22/2008    Taurus Willis, PT 08/27/2019, 9:27 PM  [facility] [place], [place], [address] Phone: [phone]   Fax:  [phone]  Name: Beverli Sundberg MRN: [id number] Date of Birth: 1950/10/29

## 2019-08-28 ENCOUNTER — Encounter: Payer: Self-pay | Admitting: Family Medicine

## 2019-08-28 ENCOUNTER — Other Ambulatory Visit: Payer: Self-pay | Admitting: Neurology

## 2019-08-28 ENCOUNTER — Encounter: Payer: Self-pay | Admitting: Neurology

## 2019-08-28 DIAGNOSIS — E039 Hypothyroidism, unspecified: Secondary | ICD-10-CM

## 2019-08-29 ENCOUNTER — Encounter: Payer: Self-pay | Admitting: Rehabilitative and Restorative Service Providers"

## 2019-08-29 ENCOUNTER — Ambulatory Visit (INDEPENDENT_AMBULATORY_CARE_PROVIDER_SITE_OTHER): Payer: Medicare Other | Admitting: Rehabilitative and Restorative Service Providers"

## 2019-08-29 ENCOUNTER — Other Ambulatory Visit: Payer: Self-pay | Admitting: *Deleted

## 2019-08-29 ENCOUNTER — Other Ambulatory Visit: Payer: Self-pay

## 2019-08-29 DIAGNOSIS — M542 Cervicalgia: Secondary | ICD-10-CM | POA: Diagnosis not present

## 2019-08-29 DIAGNOSIS — E039 Hypothyroidism, unspecified: Secondary | ICD-10-CM

## 2019-08-29 DIAGNOSIS — R293 Abnormal posture: Secondary | ICD-10-CM

## 2019-08-29 DIAGNOSIS — R29818 Other symptoms and signs involving the nervous system: Secondary | ICD-10-CM | POA: Diagnosis not present

## 2019-08-29 NOTE — Patient Instructions (Signed)
Access Code: [email or web address]  URL: [email or web address]  Date: 08/29/2019  Prepared by: Rudell Cobb   Exercises Standing Scapular Retraction - 10 reps - 1 sets - 2x daily - 7x weekly Doorway Pec Stretch at 60 Degrees Abduction with Arm Straight - 3 reps - 1 sets - 15-20 seconds hold - 2x daily - 7x weekly Seated Gentle Upper Trapezius Stretch - 3 reps - 1 sets - 15 second hold - 1-2x daily - 7x weekly Supine Chin Tuck - 10 reps - 1 sets - 3 seconds hold - 2x daily - 7x weekly Supine Thoracic Mobilization Towel Roll Vertical with Arm Stretch - 1 reps - 1 sets - 2 minutes hold - 2x daily - 7x weekly

## 2019-08-29 NOTE — Therapy (Signed)
[place], [place], [address] Phone: [phone]   Fax:  [phone]  Physical Therapy Treatment  Patient Details  Name: Anne [name] MRN: [id number] Date of Birth: [birth year] Referring Provider (PT): Beatrice Lecher, MD   Encounter Date: 08/29/2019  PT End of Session - 08/29/19 1300    Visit Number  2    Number of Visits  12    Date for PT Re-Evaluation  10/08/19    PT Start Time  1108    PT Stop Time  1146    PT Time Calculation (min)  38 min    Activity Tolerance  Patient tolerated treatment well;Patient limited by pain    Behavior During Therapy  [facility] for tasks assessed/performed       Past Medical History:  Diagnosis Date  . Acid reflux   . Aortic atherosclerosis ([place]) 04/28/2013   CT 08/2017  . Arthritis    "joints, hands" (08/30/2017)  . Chronic lower back pain   . Disc herniation    neck and low back  . Family history of adverse reaction to anesthesia    "daughter w/PONV"  . Follicular cystitis   . Hiatal hernia   . High cholesterol   . Hyperlipidemia    ELEV CHOLESTEROL  . Hypothyroidism   . Osteopenia 2014   T score -1.6 FRAX 13%/1%  . PAF (paroxysmal atrial fibrillation) ([place]) 08/30/2017   Echo 2/19: EF 55-60, normal wall motion, grade 1 diastolic dysfunction, mild PI, trivial TR //  Nuc 3/19: no ischemia or scar, EF 80 // CHADS2-VASc: 3 // Eliquis 5 bid   . Schatzki's ring     Past Surgical History:  Procedure Laterality Date  . BREAST BIOPSY Bilateral 1997   "excisional bx both benign"  . BREAST CYST ASPIRATION Right ~ 1993  . CATARACT EXTRACTION W/ INTRAOCULAR LENS IMPLANT Left 2011  . CATARACT EXTRACTION W/ INTRAOCULAR LENS IMPLANT Right 12/20/2012   Dr. Haynes Bast, OD  . COLONOSCOPY WITH ESOPHAGOGASTRODUODENOSCOPY (EGD)  "several"   "? dilated esophagus" (08/30/2017)  . DILATION AND CURETTAGE OF UTERUS  1996  . EYE MUSCLE SURGERY Bilateral 1992  .  HERNIA REPAIR    . HYSTEROSCOPY    . LAPAROSCOPIC CHOLECYSTECTOMY  09/28/2011  . OOPHORECTOMY Right 04/2000   LAP RSO/LYSIS OF ADHESIONS  . THYROIDECTOMY  1976  . TUBAL LIGATION  1976  . VARICOSE VEIN SURGERY Bilateral 1972  . VENTRAL HERNIA REPAIR  06/23/13   Dr. Franz Dell    There were no vitals filed for this visit.  Subjective Assessment - 08/29/19 1110    Subjective  The patient has 2/10 discomfort in her neck.  She continues to wake nightly with numbness and tingling sensations.  She reports her thyroid level is elevated and she wonders if this could contribute to these sensations.    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    Diagnostic tests  From 03/2018  IMPRESSION:1. 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 Slight anterolisthesis of C3 on C4 and C4 on C5 by 2 mm mostlikely degenerative in origin.3. Mild foraminal narrowing at C3-4 and C6-7.    Patient Stated Goals  Sleep through the night.    Currently in Pain?  Yes    Pain Score  2     Pain Location  Neck    Pain Orientation  Medial         [facility] PT Assessment -  08/29/19 1617      Special Tests   Other special tests  sharp purcer and alar ligament testing WNLs                   [facility] Adult PT Treatment/Exercise - 08/29/19 1128      Exercises   Exercises  Neck      Neck Exercises: Standing   Other Standing Exercises  doorway pec stretch    Other Standing Exercises  standing upper trapezius stretch seated      Neck Exercises: Supine   Cervical Isometrics  Right lateral flexion;Left lateral flexion    Cervical Isometrics Limitations  with manual resistance from PT x 3 reps during lateral sidebending for improved ROM    Neck Retraction  5 reps    Neck Retraction Limitations  with minimal contraction and cues to reduce anterior SCM firing    Lateral Flexion  Right;Left    Lateral Flexion Limitations  with passive overpressure    Other Supine  Exercise  towel roll stretch supine with thoracic extension and anterior chest opening.      Neck Exercises: Sidelying   Lateral Flexion  5 reps    Lateral Flexion Limitations  some discomfort during R and L; performed with small amplitude motion      Manual Therapy   Manual Therapy  Soft tissue mobilization;Scapular mobilization;Joint mobilization    Manual therapy comments  to improve mobility    Joint Mobilization  sidelying grade I-II mid to low c-spine lateral glides L>R and R>L    Soft tissue mobilization  supine and sidelying upper trapezius STM, L scalenes     Scapular Mobilization  sidelying scapular mobiization R and L sides for ROM, and to reduce muscle tightness             PT Education - 08/29/19 1259    Education Details  Updated HEP    Person(s) Educated  Patient    Methods  Explanation;Demonstration;Handout    Comprehension  Verbalized understanding;Returned demonstration          PT Long Term Goals - 08/27/19 2058      PT LONG TERM GOAL #1   Title  The patient will be indep with HEP for neck stabilization, strengthening and flexibility.    Time  6    Period  Weeks    Target Date  10/08/19      PT LONG TERM GOAL #2   Title  The patient will reduce functional limitation per FOTO from 38% to < or equal to 30%.    Time  6    Period  Weeks    Target Date  10/08/19      PT LONG TERM GOAL #3   Title  The patient will report waking with bilateral arm/hand numbness < 1x/week to reduce frequency of sensory changes and less sleep disturbance.    Time  6    Period  Weeks    Target Date  10/08/19      PT LONG TERM GOAL #4   Title  The patient will demonstrate sleeping positions to demonstrate neutral spine.    Time  6    Period  Weeks    Target Date  10/08/19      PT LONG TERM GOAL #5   Title  The patient will improve L rotation supine to > or equal to 45 degrees.    Baseline  30 degrees    Time  6    Period  Weeks    Target Date  10/08/19             Plan - 08/29/19 1615    Clinical Impression Statement  The patient was able to describe h/o eye procedure that explains abnormal smooth pursuit finding at evaluation.  She also reports thyroid level abnormalities, which she notes may contribute to some tingling in extremities.  PT addressing postural muscle tightness, neck tightness, hypomobility of joints and strengthening.  PT to continue working to [employer].    Rehab Potential  Good    PT Frequency  2x / week    PT Duration  6 weeks    PT Treatment/Interventions  ADLs/Self Care Home Management;Iontophoresis 4mg /ml Dexamethasone;Moist Heat;Ultrasound;Electrical Stimulation;Neuromuscular re-education;Therapeutic exercise;Therapeutic activities;Patient/family education;Taping;Dry needling;Manual techniques    PT Next Visit Plan  work on cervical retraction (reducing firing of SCM), scapular retraction strengthening, postural stretching, neck ROM, flexibility    PT Home Exercise Plan  Access Code: [email or web address]    Consulted and Agree with Plan of Care  Patient       Patient will benefit from skilled therapeutic intervention in order to improve the following deficits and impairments:  Decreased range of motion, Impaired sensation, Postural dysfunction, Hypomobility, Impaired flexibility, Pain  Visit Diagnosis: Cervicalgia  Abnormal posture  Other symptoms and signs involving the nervous system     Problem List Patient Active Problem List   Diagnosis Date Noted  . Cervical radiculopathy 08/25/2019  . History of esophagogastroduodenoscopy (EGD) 02/08/2018  . Colon cancer screening 02/08/2018  . Essential hypertension 12/25/2017  . GERD (gastroesophageal reflux disease) 08/30/2017  . Chronic diastolic (congestive) heart failure ([place]) 08/30/2017  . Paroxysmal atrial fibrillation ([place]) 08/30/2017  . Flushing 07/30/2017  . Paresthesia of both hands 07/30/2017  . 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 Aortic atherosclerosis  ([place]) 04/28/2013  . S/P cataract extraction 01/16/2013  . Pseudophakia of both eyes 12/20/2012  . Schatzki's ring 10/16/2012  . Spinal stenosis of lumbar region 08/16/2012  . Vaginal atrophy 11/14/2011  . Graves' disease 05/05/2011  . Disc herniation   . Osteopenia   . PERSONAL HISTORY OF ALLERGY TO LATEX 12/23/2009  . UNSPECIFIED DISEASE OF PHARYNX 12/16/2008  . FATIGUE 11/10/2008  . Intermittent palpitations 11/10/2008  . Hypothyroidism 09/22/2008  . Hyperlipidemia 09/22/2008  . ALLERGIC RHINITIS 09/22/2008    Vincent Ehrler, PT 08/29/2019, 4:19 PM  [facility] [place], [place], [address] Phone: [phone]   Fax:  [phone]  Name: Anne [name] MRN: [id number] Date of Birth: Jul 01, 1950

## 2019-09-01 ENCOUNTER — Ambulatory Visit (INDEPENDENT_AMBULATORY_CARE_PROVIDER_SITE_OTHER): Payer: Medicare Other | Admitting: Physical Therapy

## 2019-09-01 ENCOUNTER — Other Ambulatory Visit: Payer: Self-pay

## 2019-09-01 ENCOUNTER — Encounter: Payer: Self-pay | Admitting: Physical Therapy

## 2019-09-01 DIAGNOSIS — R29818 Other symptoms and signs involving the nervous system: Secondary | ICD-10-CM

## 2019-09-01 DIAGNOSIS — R293 Abnormal posture: Secondary | ICD-10-CM

## 2019-09-01 DIAGNOSIS — M542 Cervicalgia: Secondary | ICD-10-CM | POA: Diagnosis not present

## 2019-09-01 NOTE — Patient Instructions (Addendum)
Kinesiology tape What is kinesiology tape?  There are many brands of kinesiology tape.  KTape, [employer], [employer], Dynamic tape, to name a few. It is an elasticized tape designed to support the body's natural healing process. This tape provides stability and support to muscles and joints without restricting motion. It can also help decrease swelling in the area of application. How does it work? The tape microscopically lifts and decompresses the skin to allow for drainage of lymph (swelling) to flow away from area, reducing inflammation.  The tape has the ability to help re-educate the neuromuscular system by targeting specific receptors in the skin.  The presence of the tape increases the body's awareness of posture and body mechanics.  Do not use with: . Open wounds . Skin lesions . Adhesive allergies Safe removal of the tape: In some rare cases, mild/moderate skin irritation can occur.  This can include redness, itchiness, or hives. If this occurs, immediately remove tape and consult your primary care physician if symptoms are severe or do not resolve within 2 days.  To remove tape safely, hold nearby skin with one hand and gentle roll tape down with other hand.  You can apply oil or conditioner to tape while in shower prior to removal to loosen adhesive.  DO NOT swiftly rip tape off like a band-aid, as this could cause skin tears and additional skin irritation.   Access Code: [email or web address]  URL: [email or web address]  Date: 09/01/2019  Prepared by: Kerin Perna   Exercises  Standing Scapular Retraction - 10 reps - 1 sets - 2x daily - 7x weekly  Doorway Pec Stretch at 60 Degrees Abduction with Arm Straight - 3 reps - 1 sets - 15-20 seconds hold - 2x daily - 7x weekly  Seated Gentle Upper Trapezius Stretch - 3 reps - 1 sets - 15 second hold - 1-2x daily - 7x weekly  Supine Chin Tuck - 10 reps - 1 sets - 3 seconds hold - 2x daily - 7x weekly  Supine Thoracic Mobilization  Towel Roll Vertical with Arm Stretch - 1 reps - 1 sets - 2 minutes hold - 2x daily - 7x weekly  W's - this is a stick up position - 10 reps - 1 sets - 5 hold - 1x daily - 7x weekly  Doorway Pec Stretch at 90 Degrees Abduction - 2 reps - 1 sets - 15 hold - 1x daily - 7x weekly

## 2019-09-01 NOTE — Therapy (Signed)
[place], [place], [address] Phone: [phone]   Fax:  [phone]  Physical Therapy Treatment  Patient Details  Name: Anne [name] MRN: [id number] Date of Birth: 1950/08/16 Referring Provider (PT): Beatrice Lecher, MD   Encounter Date: 09/01/2019  PT End of Session - 09/01/19 1351    Visit Number  3    Number of Visits  12    Date for PT Re-Evaluation  10/08/19    PT Start Time  1350    PT Stop Time  1429    PT Time Calculation (min)  39 min    Activity Tolerance  Patient tolerated treatment well    Behavior During Therapy  [facility] for tasks assessed/performed       Past Medical History:  Diagnosis Date  . Acid reflux   . Aortic atherosclerosis ([place]) 04/28/2013   CT 08/2017  . Arthritis    "joints, hands" (08/30/2017)  . Chronic lower back pain   . Disc herniation    neck and low back  . Family history of adverse reaction to anesthesia    "daughter w/PONV"  . Follicular cystitis   . Hiatal hernia   . High cholesterol   . Hyperlipidemia    ELEV CHOLESTEROL  . Hypothyroidism   . Osteopenia 2014   T score -1.6 FRAX 13%/1%  . PAF (paroxysmal atrial fibrillation) ([place]) 08/30/2017   Echo 2/19: EF 55-60, normal wall motion, grade 1 diastolic dysfunction, mild PI, trivial TR //  Nuc 3/19: no ischemia or scar, EF 80 // CHADS2-VASc: 3 // Eliquis 5 bid   . Schatzki's ring     Past Surgical History:  Procedure Laterality Date  . BREAST BIOPSY Bilateral 1997   "excisional bx both benign"  . BREAST CYST ASPIRATION Right ~ 1993  . CATARACT EXTRACTION W/ INTRAOCULAR LENS IMPLANT Left 2011  . CATARACT EXTRACTION W/ INTRAOCULAR LENS IMPLANT Right 12/20/2012   Dr. Haynes Bast, OD  . COLONOSCOPY WITH ESOPHAGOGASTRODUODENOSCOPY (EGD)  "several"   "? dilated esophagus" (08/30/2017)  . DILATION AND CURETTAGE OF UTERUS  1996  . EYE MUSCLE SURGERY Bilateral 1992  . HERNIA REPAIR    .  HYSTEROSCOPY    . LAPAROSCOPIC CHOLECYSTECTOMY  09/28/2011  . OOPHORECTOMY Right 04/2000   LAP RSO/LYSIS OF ADHESIONS  . THYROIDECTOMY  1976  . TUBAL LIGATION  1976  . VARICOSE VEIN SURGERY Bilateral 1972  . VENTRAL HERNIA REPAIR  06/23/13   Dr. Franz Dell    There were no vitals filed for this visit.  Subjective Assessment - 09/01/19 1351    Subjective  Pt reports she is sore in her neck. She has continued tingling into face and into arms after sleeping.    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    Diagnostic tests  From 03/2018  IMPRESSION:1. 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 Slight anterolisthesis of C3 on C4 and C4 on C5 by 2 mm mostlikely degenerative in origin.3. Mild foraminal narrowing at C3-4 and C6-7.    Patient Stated Goals  Sleep through the night.    Currently in Pain?  Yes    Pain Score  2     Pain Location  Neck    Pain Orientation  Right;Left    Pain Descriptors / Indicators  Sore         [facility] PT Assessment - 09/01/19 0001      Assessment   Medical  Diagnosis  cervical pain, cervical radiculopathy    Referring Provider (PT)  Beatrice Lecher, MD    Onset Date/Surgical Date  08/25/19    Hand Dominance  Right    Prior Therapy  none      AROM   Cervical Flexion  53    Cervical Extension  32    Cervical - Right Side Bend  32    Cervical - Left Side Bend  23    Cervical - Right Rotation  45    Cervical - Left Rotation  48      [facility] Adult PT Treatment/Exercise - 09/01/19 0001      Self-Care   Self-Care  Other Self-Care Comments    Other Self-Care Comments   pt instructed in self massage to Rt SCM; pt returned demo with cues.       Neck Exercises: Seated   Cervical Rotation  Right;Left;5 reps   with 3 chin nods each rep, each direction   Lateral Flexion  Right;Left   2 reps, 15 sec hold stretch   W Back  5 reps   5 sec hold   Other Seated Exercise  L's x 5 sec hold x 5 reps       Neck Exercises: Supine   Neck Retraction  10 reps;3 secs    Neck Retraction Limitations  cues for form, to dial back contraction     Cervical Rotation  Right;Left   2 reps; switched to seated    Other Supine Exercise  trial of hooklying on pool noodle; not tolerated.   Hooklying:  10 snow angels.     Other Supine Exercise  scap retraction x 5 sec hold x 5 reps      Manual Therapy   Manual therapy comments  I strip of sensitive skin Rock tape place perpendicular to spine over C7/T1 with 20% stretch with head in neutral, to decompress tissue and increase postural awareness.     Soft tissue mobilization  IASTM to bilat cervical paraspinals, upper/mid trap and levator - to decrease fascial restrictions and improve ROM.        Neck Exercises: Stretches   Other Neck Stretches  midlevel doorway stretch x 20 sec x 2 reps             PT Education - 09/01/19 1429    Education Details  HEP, ktape info    Person(s) Educated  Patient    Methods  Explanation;Handout;Verbal cues;Tactile cues;Demonstration    Comprehension  Verbalized understanding;Returned demonstration          PT Long Term Goals - 09/01/19 1648      PT LONG TERM GOAL #1   Title  The patient will be indep with HEP for neck stabilization, strengthening and flexibility.    Time  6    Period  Weeks    Status  On-going      PT LONG TERM GOAL #2   Title  The patient will reduce functional limitation per FOTO from 38% to < or equal to 30%.    Time  6    Period  Weeks    Status  On-going      PT LONG TERM GOAL #3   Title  The patient will report waking with bilateral arm/hand numbness < 1x/week to reduce frequency of sensory changes and less sleep disturbance.    Time  6    Period  Weeks    Status  On-going      PT LONG  TERM GOAL #4   Title  The patient will demonstrate sleeping positions to demonstrate neutral spine.    Time  6    Period  Weeks    Status  On-going      PT LONG TERM GOAL #5   Title  The  patient will improve L rotation supine to > or equal to 45 degrees.    Time  6    Period  Weeks    Status  Achieved            Plan - 09/01/19 1415    Clinical Impression Statement  Pt required some cues to dial back contraction of chin tuck in supine; improved tolerance after cues.  Pt has met LTG#5 with improved cervical rotation. She reported reduction of pain with supine exercises; tolerated all exercises without increase in neck pain.  Trial of sensitive skin Rock tape applied to neck.    Rehab Potential  Good    PT Frequency  2x / week    PT Duration  6 weeks    PT Treatment/Interventions  ADLs/Self Care Home Management;Iontophoresis 89m/ml Dexamethasone;Moist Heat;Ultrasound;Electrical Stimulation;Neuromuscular re-education;Therapeutic exercise;Therapeutic activities;Patient/family education;Taping;Dry needling;Manual techniques    PT Next Visit Plan  assess response to new exercises, continue neck ROM and postural strengthening.    PT Home Exercise Plan  Access Code: [email or web address]    Consulted and Agree with Plan of Care  Patient       Patient will benefit from skilled therapeutic intervention in order to improve the following deficits and impairments:  Decreased range of motion, Impaired sensation, Postural dysfunction, Hypomobility, Impaired flexibility, Pain  Visit Diagnosis: Cervicalgia  Abnormal posture  Other symptoms and signs involving the nervous system     Problem List Patient Active Problem List   Diagnosis Date Noted  . Cervical radiculopathy 08/25/2019  . History of esophagogastroduodenoscopy (EGD) 02/08/2018  . Colon cancer screening 02/08/2018  . Essential hypertension 12/25/2017  . GERD (gastroesophageal reflux disease) 08/30/2017  . Chronic diastolic [place]congestive) heart failure ([place] 08/30/2017  . [place] ([place] 08/30/2017  . [place] 07/30/2017  . Paresthesia of both hands 07/30/2017  . Postsurgical hypothyroidism 07/30/2017   . Aortic atherosclerosis ([place] 04/28/2013  . S/P cataract extraction 01/16/2013  . Pseudophakia of both eyes 12/20/2012  . Schatzki's ring 10/16/2012  . Spinal stenosis of lumbar region 08/16/2012  . Vaginal atrophy 11/14/2011  . Graves' disease 05/05/2011  . Disc herniation   . Osteopenia   . PERSONAL HISTORY OF ALLERGY TO LATEX 12/23/2009  . UNSPECIFIED DISEASE OF PHARYNX 12/16/2008  . FATIGUE 11/10/2008  . Intermittent palpitations 11/10/2008  . Hypothyroidism 09/22/2008  . Hyperlipidemia 09/22/2008  . ALLERGIC RHINITIS 09/22/2008   JKerin Perna PTA 09/01/19 4:49 PM  [place] [address]Phone: [phone]  Fax:  [phone] Name: SThresa DozierMRN: [id number] of Birth: 311/19/1952

## 2019-09-04 ENCOUNTER — Encounter: Payer: Self-pay | Admitting: Physical Therapy

## 2019-09-04 ENCOUNTER — Other Ambulatory Visit: Payer: Self-pay

## 2019-09-04 ENCOUNTER — Ambulatory Visit (INDEPENDENT_AMBULATORY_CARE_PROVIDER_SITE_OTHER): Payer: Medicare Other | Admitting: Physical Therapy

## 2019-09-04 DIAGNOSIS — R293 Abnormal posture: Secondary | ICD-10-CM | POA: Diagnosis not present

## 2019-09-04 DIAGNOSIS — M542 Cervicalgia: Secondary | ICD-10-CM | POA: Diagnosis not present

## 2019-09-04 DIAGNOSIS — R29818 Other symptoms and signs involving the nervous system: Secondary | ICD-10-CM

## 2019-09-04 NOTE — Therapy (Signed)
[place], [place], [address] Phone: [phone]   Fax:  [phone]  Physical Therapy Treatment  Patient Details  Name: Anne [name] MRN: [id number] Date of Birth: 14-Dec-1950 Referring Provider (PT): Beatrice Lecher, MD   Encounter Date: 09/04/2019  PT End of Session - 09/04/19 1118    Visit Number  4    Number of Visits  12    Date for PT Re-Evaluation  10/08/19    PT Start Time  [id number]    PT Stop Time  1143    PT Time Calculation (min)  38 min    Activity Tolerance  Patient tolerated treatment well;No increased pain    Behavior During Therapy  WFL for tasks assessed/performed       Past Medical History:  Diagnosis Date  . Acid reflux   . Aortic atherosclerosis ([place]) 04/28/2013   CT 08/2017  . Arthritis    "joints, hands" (08/30/2017)  . Chronic lower back pain   . Disc herniation    neck and low back  . Family history of adverse reaction to anesthesia    "daughter w/PONV"  . Follicular cystitis   . Hiatal hernia   . High cholesterol   . Hyperlipidemia    ELEV CHOLESTEROL  . Hypothyroidism   . Osteopenia 2014   T score -1.6 FRAX 13%/1%  . PAF (paroxysmal atrial fibrillation) ([place]) 08/30/2017   Echo 2/19: EF 55-60, normal wall motion, grade 1 diastolic dysfunction, mild PI, trivial TR //  Nuc 3/19: no ischemia or scar, EF 80 // CHADS2-VASc: 3 // Eliquis 5 bid   . Schatzki's ring     Past Surgical History:  Procedure Laterality Date  . BREAST BIOPSY Bilateral 1997   "excisional bx both benign"  . BREAST CYST ASPIRATION Right ~ 1993  . CATARACT EXTRACTION W/ INTRAOCULAR LENS IMPLANT Left 2011  . CATARACT EXTRACTION W/ INTRAOCULAR LENS IMPLANT Right 12/20/2012   Dr. Haynes Bast, OD  . COLONOSCOPY WITH ESOPHAGOGASTRODUODENOSCOPY (EGD)  "several"   "? dilated esophagus" (08/30/2017)  . DILATION AND CURETTAGE OF UTERUS  1996  . EYE MUSCLE SURGERY Bilateral 1992  . HERNIA  REPAIR    . HYSTEROSCOPY    . LAPAROSCOPIC CHOLECYSTECTOMY  09/28/2011  . OOPHORECTOMY Right 04/2000   LAP RSO/LYSIS OF ADHESIONS  . THYROIDECTOMY  1976  . TUBAL LIGATION  1976  . VARICOSE VEIN SURGERY Bilateral 1972  . VENTRAL HERNIA REPAIR  06/23/13   Dr. Franz Dell    There were no vitals filed for this visit.  Subjective Assessment - 09/04/19 1107    Subjective  Pt reports she had a "rough night"; woke up at 4am with a little pain and tingling into BUE.  After she was up a while, the symptoms resolved.  She said the tape felt alright the first day, but became itchy on day 2.  Husband was able to remove it without any irritation.    Patient Stated Goals  Sleep through the night.    Currently in Pain?  No/denies    Pain Score  0-No pain         [facility] PT Assessment - 09/04/19 0001      Assessment   Medical Diagnosis  cervical pain, cervical radiculopathy    Referring Provider (PT)  Beatrice Lecher, MD    Onset Date/Surgical Date  08/25/19    Hand Dominance  Right    Prior Therapy  none  [facility] Adult PT Treatment/Exercise - 09/04/19 0001      Neck Exercises: Machines for Strengthening   UBE (Upper Arm Bike)  L1: 1 min forward. 1 min backward (standing )      Neck Exercises: Seated   Cervical Rotation  Right;Left;5 reps   with 3 chin nods each rep, each direction   Lateral Flexion  Right;Left   2 reps, 15 sec hold stretch   W Back  5 reps   5 sec hold   Other Seated Exercise  L's x 5 sec hold x 5 reps      Neck Exercises: Supine   Neck Retraction  10 reps;3 secs    Neck Retraction Limitations  cues for form, to dial back contraction     Shoulder Flexion  Both;10 reps   band between hands.      Neck Exercises: Sidelying   Lateral Flexion  Left;Right;5 reps    Lateral Flexion Limitations  some discomfrt and report of popping in neck.     Other Sidelying Exercise  open book x 5 reps each side.       Manual Therapy   Manual therapy comments  I strip of  sensitive skin Rock tape place perpendicular to spine over C7/T1 with 20% stretch with head in neutral, to decompress tissue and increase postural awareness.     Soft tissue mobilization  IASTM to bilat cervical paraspinals, upper/mid trap and levator - to decrease fascial restrictions and improve ROM.        Neck Exercises: Stretches   Other Neck Stretches  midlevel doorway stretch x 20 sec x 2 reps;  unilateral high doorway stretch x 20 sec x 2 reps each side.                   PT Long Term Goals - 09/01/19 1648      PT LONG TERM GOAL #1   Title  The patient will be indep with HEP for neck stabilization, strengthening and flexibility.    Time  6    Period  Weeks    Status  On-going      PT LONG TERM GOAL #2   Title  The patient will reduce functional limitation per FOTO from 38% to < or equal to 30%.    Time  6    Period  Weeks    Status  On-going      PT LONG TERM GOAL #3   Title  The patient will report waking with bilateral arm/hand numbness < 1x/week to reduce frequency of sensory changes and less sleep disturbance.    Time  6    Period  Weeks    Status  On-going      PT LONG TERM GOAL #4   Title  The patient will demonstrate sleeping positions to demonstrate neutral spine.    Time  6    Period  Weeks    Status  On-going      PT LONG TERM GOAL #5   Title  The patient will improve L rotation supine to > or equal to 45 degrees.    Time  6    Period  Weeks    Status  Achieved            Plan - 09/04/19 1255    Clinical Impression Statement  Pt progressing towards LTG#3 with less frequency and duration of tingling sensation in face and arms upon waking.  She tolerated all exercises well, without report of  increased neck pain.  2nd trial of sensitive skin Rock tape applied after IASTM.    Rehab Potential  Good    PT Frequency  2x / week    PT Duration  6 weeks    PT Treatment/Interventions  ADLs/Self Care Home Management;Iontophoresis 4mg /ml  Dexamethasone;Moist Heat;Ultrasound;Electrical Stimulation;Neuromuscular re-education;Therapeutic exercise;Therapeutic activities;Patient/family education;Taping;Dry needling;Manual techniques    PT Next Visit Plan  assess response to new exercises, continue neck ROM and postural strengthening.    PT Home Exercise Plan  Access Code: [email or web address]    Consulted and Agree with Plan of Care  Patient       Patient will benefit from skilled therapeutic intervention in order to improve the following deficits and impairments:  Decreased range of motion, Impaired sensation, Postural dysfunction, Hypomobility, Impaired flexibility, Pain  Visit Diagnosis: Cervicalgia  Abnormal posture  Other symptoms and signs involving the nervous system     Problem List Patient Active Problem List   Diagnosis Date Noted  . Cervical radiculopathy 08/25/2019  . History of esophagogastroduodenoscopy (EGD) 02/08/2018  . Colon cancer screening 02/08/2018  . Essential hypertension 12/25/2017  . GERD (gastroesophageal reflux disease) 08/30/2017  . Chronic diastolic (congestive) heart failure ([place]) 08/30/2017  . Paroxysmal atrial fibrillation ([place]) 08/30/2017  . Flushing 07/30/2017  . Paresthesia of both hands 07/30/2017  . 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 Aortic atherosclerosis ([place]) 04/28/2013  . S/P cataract extraction 01/16/2013  . Pseudophakia of both eyes 12/20/2012  . Schatzki's ring 10/16/2012  . Spinal stenosis of lumbar region 08/16/2012  . Vaginal atrophy 11/14/2011  . Graves' disease 05/05/2011  . Disc herniation   . Osteopenia   . PERSONAL HISTORY OF ALLERGY TO LATEX 12/23/2009  . UNSPECIFIED DISEASE OF PHARYNX 12/16/2008  . FATIGUE 11/10/2008  . Intermittent palpitations 11/10/2008  . Hypothyroidism 09/22/2008  . Hyperlipidemia 09/22/2008  . ALLERGIC RHINITIS 09/22/2008   Kerin Perna, PTA 09/04/19 12:57 PM  [place], [place], [address] Phone: [phone]   Fax:  [phone]  Name: Makinzi Kemmerling MRN: [id number] Date of Birth: 1950/09/27

## 2019-09-08 ENCOUNTER — Ambulatory Visit (INDEPENDENT_AMBULATORY_CARE_PROVIDER_SITE_OTHER): Payer: Medicare Other | Admitting: Rehabilitative and Restorative Service Providers"

## 2019-09-08 ENCOUNTER — Encounter: Payer: Self-pay | Admitting: Rehabilitative and Restorative Service Providers"

## 2019-09-08 ENCOUNTER — Other Ambulatory Visit: Payer: Self-pay

## 2019-09-08 DIAGNOSIS — M542 Cervicalgia: Secondary | ICD-10-CM | POA: Diagnosis not present

## 2019-09-08 DIAGNOSIS — R29818 Other symptoms and signs involving the nervous system: Secondary | ICD-10-CM | POA: Diagnosis not present

## 2019-09-08 DIAGNOSIS — R293 Abnormal posture: Secondary | ICD-10-CM | POA: Diagnosis not present

## 2019-09-08 NOTE — Therapy (Signed)
[place], [place], [address] Phone: [phone]   Fax:  [phone]  Physical Therapy Treatment  Patient Details  Name: Anne [name] MRN: [id number] Date of Birth: 26-Nov-1950 Referring Provider (PT): Beatrice Lecher, MD   Encounter Date: 09/08/2019  PT End of Session - 09/08/19 1103    Visit Number  5    Number of Visits  12    Date for PT Re-Evaluation  10/08/19    PT Start Time  1101    PT Stop Time  1149    PT Time Calculation (min)  48 min    Activity Tolerance  Patient tolerated treatment well       Past Medical History:  Diagnosis Date  . Acid reflux   . Aortic atherosclerosis ([place]) 04/28/2013   CT 08/2017  . Arthritis    "joints, hands" (08/30/2017)  . Chronic lower back pain   . Disc herniation    neck and low back  . Family history of adverse reaction to anesthesia    "daughter w/PONV"  . Follicular cystitis   . Hiatal hernia   . High cholesterol   . Hyperlipidemia    ELEV CHOLESTEROL  . Hypothyroidism   . Osteopenia 2014   T score -1.6 FRAX 13%/1%  . PAF (paroxysmal atrial fibrillation) ([place]) 08/30/2017   Echo 2/19: EF 55-60, normal wall motion, grade 1 diastolic dysfunction, mild PI, trivial TR //  Nuc 3/19: no ischemia or scar, EF 80 // CHADS2-VASc: 3 // Eliquis 5 bid   . Schatzki's ring     Past Surgical History:  Procedure Laterality Date  . BREAST BIOPSY Bilateral 1997   "excisional bx both benign"  . BREAST CYST ASPIRATION Right ~ 1993  . CATARACT EXTRACTION W/ INTRAOCULAR LENS IMPLANT Left 2011  . CATARACT EXTRACTION W/ INTRAOCULAR LENS IMPLANT Right 12/20/2012   Dr. Haynes Bast, OD  . COLONOSCOPY WITH ESOPHAGOGASTRODUODENOSCOPY (EGD)  "several"   "? dilated esophagus" (08/30/2017)  . DILATION AND CURETTAGE OF UTERUS  1996  . EYE MUSCLE SURGERY Bilateral 1992  . HERNIA REPAIR    . HYSTEROSCOPY    . LAPAROSCOPIC CHOLECYSTECTOMY  09/28/2011  .  OOPHORECTOMY Right 04/2000   LAP RSO/LYSIS OF ADHESIONS  . THYROIDECTOMY  1976  . TUBAL LIGATION  1976  . VARICOSE VEIN SURGERY Bilateral 1972  . VENTRAL HERNIA REPAIR  06/23/13   Dr. Franz Dell    There were no vitals filed for this visit.  Subjective Assessment - 09/08/19 1104    Subjective  Patient reports that she has some pain through the neck and shoulder area today. Big problem is tingling into the neck and arms.    Currently in Pain?  Yes    Pain Score  2     Pain Location  Neck    Pain Orientation  Right;Left    Pain Descriptors / Indicators  Sore    Pain Type  Chronic pain    Pain Radiating Towards  shoulders into hands    Pain Onset  More than a month ago    Pain Frequency  Intermittent    Aggravating Factors   sleeping; numbness in the jaws/lips/eyes    Pain Relieving Factors  getting out of bed and sitting up                       [facility] Adult PT Treatment/Exercise - 09/08/19 0001      Neck Exercises: Machines  for Strengthening   UBE (Upper Arm Bike)  L1:  forward. backward (standing ) 3 min total       Neck Exercises: Supine   Neck Retraction  10 reps;5 secs      Moist Heat Therapy   Number Minutes Moist Heat  15 Minutes    Moist Heat Location  Cervical      Electrical Stimulation   Electrical Stimulation Location  bilat cervical paraspinals     Electrical Stimulation Action  TENS     Electrical Stimulation Parameters  to tolerance    Electrical Stimulation Goals  Pain;Tone      Manual Therapy   Manual therapy comments  pt supine supported hooklying     Joint Mobilization  CP cervical mobs Grade I/II    Soft tissue mobilization  deep tissue work through the ant/lat/posterior crervical spine       Neck Exercises: Stretches   Other Neck Stretches  3 way doorway stretch 30 sec x 2 each position              PT Education - 09/08/19 1134    Education Details  DN; TENS    Person(s) Educated  Patient    Methods   Explanation;Handout          PT Long Term Goals - 09/01/19 1648      PT LONG TERM GOAL #1   Title  The patient will be indep with HEP for neck stabilization, strengthening and flexibility.    Time  6    Period  Weeks    Status  On-going      PT LONG TERM GOAL #2   Title  The patient will reduce functional limitation per FOTO from 38% to < or equal to 30%.    Time  6    Period  Weeks    Status  On-going      PT LONG TERM GOAL #3   Title  The patient will report waking with bilateral arm/hand numbness < 1x/week to reduce frequency of sensory changes and less sleep disturbance.    Time  6    Period  Weeks    Status  On-going      PT LONG TERM GOAL #4   Title  The patient will demonstrate sleeping positions to demonstrate neutral spine.    Time  6    Period  Weeks    Status  On-going      PT LONG TERM GOAL #5   Title  The patient will improve L rotation supine to > or equal to 45 degrees.    Time  6    Period  Weeks    Status  Achieved            Plan - 09/08/19 1144    Clinical Impression Statement  Persistent intermittent radicular numbness and tingling bilat UE's; jaws; face at night. She does have muscular tightness through the ant/lat/post cervical musculature. Good response to manual tractioin. Noted tightness and tenderness to palpation through the scalp/skull. Trial of TENS unit for possible use at home. May benefit from DN and trial of mechanical traction for home(gentle constant pull)    Rehab Potential  Good    PT Frequency  2x / week    PT Duration  6 weeks    PT Treatment/Interventions  ADLs/Self Care Home Management;Iontophoresis 4mg /ml Dexamethasone;Moist Heat;Ultrasound;Electrical Stimulation;Neuromuscular re-education;Therapeutic exercise;Therapeutic activities;Patient/family education;Taping;Dry needling;Manual techniques    PT Next Visit Plan  continue postural educatin/correction; progress  exercises  as indicated; assess response to TENS unit for  home use; consider trial of DN and/or mechanical traction(gentle pull/constant), continue neck ROM and postural strengthening.    PT Home Exercise Plan  Access Code: [email or web address]    Consulted and Agree with Plan of Care  Patient       Patient will benefit from skilled therapeutic intervention in order to improve the following deficits and impairments:     Visit Diagnosis: Cervicalgia  Abnormal posture  Other symptoms and signs involving the nervous system     Problem List Patient Active Problem List   Diagnosis Date Noted  . Cervical radiculopathy 08/25/2019  . History of esophagogastroduodenoscopy (EGD) 02/08/2018  . Colon cancer screening 02/08/2018  . Essential hypertension 12/25/2017  . GERD (gastroesophageal reflux disease) 08/30/2017  . Chronic diastolic (congestive) heart failure ([place]) 08/30/2017  . Paroxysmal atrial fibrillation ([place]) 08/30/2017  . Flushing 07/30/2017  . Paresthesia of both hands 07/30/2017  . 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 Aortic atherosclerosis ([place]) 04/28/2013  . S/P cataract extraction 01/16/2013  . Pseudophakia of both eyes 12/20/2012  . Schatzki's ring 10/16/2012  . Spinal stenosis of lumbar region 08/16/2012  . Vaginal atrophy 11/14/2011  . Graves' disease 05/05/2011  . Disc herniation   . Osteopenia   . PERSONAL HISTORY OF ALLERGY TO LATEX 12/23/2009  . UNSPECIFIED DISEASE OF PHARYNX 12/16/2008  . FATIGUE 11/10/2008  . Intermittent palpitations 11/10/2008  . Hypothyroidism 09/22/2008  . Hyperlipidemia 09/22/2008  . ALLERGIC RHINITIS 09/22/2008    Anne [name] PT, MPH  09/08/2019, 1:33 PM  [facility] [place], [place], [address] Phone: [phone]   Fax:  [phone]  Name: Anne [name] MRN: [id number] Date of Birth: 1951-05-19

## 2019-09-08 NOTE — Patient Instructions (Signed)
Trigger Point Dry Needling  . What is Trigger Point Dry Needling (DN)? o DN is a physical therapy technique used to treat muscle pain and dysfunction. Specifically, DN helps deactivate muscle trigger points (muscle knots).  o A thin filiform needle is used to penetrate the skin and stimulate the underlying trigger point. The goal is for a local twitch response (LTR) to occur and for the trigger point to relax. No medication of any kind is injected during the procedure.   . What Does Trigger Point Dry Needling Feel Like?  o The procedure feels different for each individual patient. Some patients report that they do not actually feel the needle enter the skin and overall the process is not painful. Very mild bleeding may occur. However, many patients feel a deep cramping in the muscle in which the needle was inserted. This is the local twitch response.   [name] How Will I feel after the treatment? o Soreness is normal, and the onset of soreness may not occur for a few hours. Typically this soreness does not last longer than two days.  o Bruising is uncommon, however; ice can be used to decrease any possible bruising.  o In rare cases feeling tired or nauseous after the treatment is normal. In addition, your symptoms may get worse before they get better, this period will typically not last longer than 24 hours.   . What Can I do After My Treatment? o Increase your hydration by drinking more water for the next 24 hours. o You may place ice or heat on the areas treated that have become sore, however, do not use heat on inflamed or bruised areas. Heat often brings more relief post needling. o You can continue your regular activities, but vigorous activity is not recommended initially after the treatment for 24 hours. o DN is best combined with other physical therapy such as strengthening, stretching, and other therapies.    TENS UNIT: This is helpful for muscle pain and spasm.   Search and Purchase a  TENS 7000 2nd edition at www.tenspros.com. It should be less than $30.     TENS unit instructions: Do not shower or bathe with the unit on Turn the unit off before removing electrodes or batteries If the electrodes lose stickiness add a drop of water to the electrodes after they are disconnected from the unit and place on plastic sheet. If you continued to have difficulty, call the TENS unit company to purchase more electrodes. Do not apply lotion on the skin area prior to use. Make sure the skin is clean and dry as this will help prolong the life of the electrodes. After use, always check skin for unusual red areas, rash or other skin difficulties. If there are any skin problems, does not apply electrodes to the same area. Never remove the electrodes from the unit by pulling the wires. Do not use the TENS unit or electrodes other than as directed. Do not change electrode placement without consultating your therapist or physician. Keep 2 fingers with between each electrode.

## 2019-09-11 ENCOUNTER — Ambulatory Visit (INDEPENDENT_AMBULATORY_CARE_PROVIDER_SITE_OTHER): Payer: Medicare Other | Admitting: Physical Therapy

## 2019-09-11 ENCOUNTER — Encounter: Payer: Self-pay | Admitting: Physical Therapy

## 2019-09-11 ENCOUNTER — Other Ambulatory Visit: Payer: Self-pay

## 2019-09-11 DIAGNOSIS — R293 Abnormal posture: Secondary | ICD-10-CM | POA: Diagnosis not present

## 2019-09-11 DIAGNOSIS — M542 Cervicalgia: Secondary | ICD-10-CM

## 2019-09-11 DIAGNOSIS — R29818 Other symptoms and signs involving the nervous system: Secondary | ICD-10-CM | POA: Diagnosis not present

## 2019-09-11 NOTE — Patient Instructions (Signed)
Access Code: [email or web address]: [email or web address]: 03/18/2021Prepared by: [place]  Standing Scapular Retraction - 2 x daily - 7 x weekly - 5 reps - 1 sets  Doorway Pec Stretch at 60 Degrees Abduction with Arm Straight - 2 x daily - 7 x weekly - 3 reps - 1 sets - 15-20 seconds hold  Seated Gentle Upper Trapezius Stretch - 1-2 x daily - 7 x weekly - 3 reps - 1 sets - 15 second hold  Supine Chin Tuck - 2 x daily - 7 x weekly - 10 reps - 1 sets - 3 seconds hold  Supine Thoracic Mobilization Towel Roll Vertical with Arm Stretch - 2 x daily - 7 x weekly - 1 reps - 1 sets - 2 minutes hold  Doorway Pec Stretch at 90 Degrees Abduction - 1 x daily - 7 x weekly - 2 reps - 1 sets - 15 hold  Standing Row with Anchored Resistance - 1 x daily - 7 x weekly - 2 sets - 10 reps  Shoulder Extension with Resistance - 1 x daily - 7 x weekly - 2 sets - 10 reps  Shoulder External Rotation and Scapular Retraction with Resistance - 1 x daily - 7 x weekly - 2 sets - 10 reps

## 2019-09-11 NOTE — Therapy (Signed)
[place], [place], [address] Phone: [phone]   Fax:  [phone]  Physical Therapy Treatment  Patient Details  Name: Anne [name] MRN: [id number] Date of Birth: 09-15-1950 Referring Provider (PT): Beatrice Lecher, MD   Encounter Date: 09/11/2019  PT End of Session - 09/11/19 1101    Visit Number  6    Number of Visits  12    Date for PT Re-Evaluation  10/08/19    PT Start Time  1101    PT Stop Time  1141    PT Time Calculation (min)  40 min    Activity Tolerance  Patient tolerated treatment well       Past Medical History:  Diagnosis Date  . Acid reflux   . Aortic atherosclerosis ([place]) 04/28/2013   CT 08/2017  . Arthritis    "joints, hands" (08/30/2017)  . Chronic lower back pain   . Disc herniation    neck and low back  . Family history of adverse reaction to anesthesia    "daughter w/PONV"  . Follicular cystitis   . Hiatal hernia   . High cholesterol   . Hyperlipidemia    ELEV CHOLESTEROL  . Hypothyroidism   . Osteopenia 2014   T score -1.6 FRAX 13%/1%  . PAF (paroxysmal atrial fibrillation) ([place]) 08/30/2017   Echo 2/19: EF 55-60, normal wall motion, grade 1 diastolic dysfunction, mild PI, trivial TR //  Nuc 3/19: no ischemia or scar, EF 80 // CHADS2-VASc: 3 // Eliquis 5 bid   . Schatzki's ring     Past Surgical History:  Procedure Laterality Date  . BREAST BIOPSY Bilateral 1997   "excisional bx both benign"  . BREAST CYST ASPIRATION Right ~ 1993  . CATARACT EXTRACTION W/ INTRAOCULAR LENS IMPLANT Left 2011  . CATARACT EXTRACTION W/ INTRAOCULAR LENS IMPLANT Right 12/20/2012   Dr. Haynes Bast, OD  . COLONOSCOPY WITH ESOPHAGOGASTRODUODENOSCOPY (EGD)  "several"   "? dilated esophagus" (08/30/2017)  . DILATION AND CURETTAGE OF UTERUS  1996  . EYE MUSCLE SURGERY Bilateral 1992  . HERNIA REPAIR    . HYSTEROSCOPY    . LAPAROSCOPIC CHOLECYSTECTOMY  09/28/2011  .  OOPHORECTOMY Right 04/2000   LAP RSO/LYSIS OF ADHESIONS  . THYROIDECTOMY  1976  . TUBAL LIGATION  1976  . VARICOSE VEIN SURGERY Bilateral 1972  . VENTRAL HERNIA REPAIR  06/23/13   Dr. Franz Dell    There were no vitals filed for this visit.  Subjective Assessment - 09/11/19 1101    Subjective  Pt reports her neck felt very relaxed with no pain remainder of day. Continues to wake early in morning with tingling in arms/neck.  No new changes.    Diagnostic tests  From 03/2018  IMPRESSION:1. 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 Slight anterolisthesis of C3 on C4 and C4 on C5 by 2 mm mostlikely degenerative in origin.3. Mild foraminal narrowing at C3-4 and C6-7.    Patient Stated Goals  Sleep through the night.    Currently in Pain?  No/denies    Pain Score  0-No pain         [facility] PT Assessment - 09/11/19 0001      Assessment   Medical Diagnosis  cervical pain, cervical radiculopathy    Referring Provider (PT)  Beatrice Lecher, MD    Onset Date/Surgical Date  08/25/19    Hand Dominance  Right    Prior Therapy  none      [facility] Adult  PT Treatment/Exercise - 09/11/19 0001      Neck Exercises: Machines for Strengthening   UBE (Upper Arm Bike)  L2:  forward. backward (standing ) 3 min total       Neck Exercises: Standing   Other Standing Exercises  W's with back against noodle x 5 reps, 5 sec (with axial ext), L's x 5 sec x 5 reps   chin tucks x 5 sec x 5 reps    Other Standing Exercises  resisted row, shoulder ext, and bilat shoulder ER with yellow band x 10 each       Neck Exercises: Seated   Cervical Rotation  Right;Left;5 reps    Lateral Flexion  Right;Left   2 reps, 15 sec hold stretch     Neck Exercises: Prone   Axial Exension  5 reps      Manual Therapy   Manual Therapy  Soft tissue mobilization;Myofascial release;Manual Traction    Manual therapy comments  pt supine supported hooklying     Soft tissue mobilization  STM work through posterior cervical  musculature, upper trap, scalenes, levator scapulae    Myofascial Release  MFR to Lt/Rt scalenes and platysma     Manual Traction  gentle cervical traction held 30 sec x 5 reps       Neck Exercises: Stretches   Other Neck Stretches  3 way doorway stretch 30 sec x 2 each position              PT Education - 09/11/19 1254    Education Details  HEP, issued yellow band    Person(s) Educated  Patient    Methods  Explanation;Handout;Demonstration;Verbal cues;Tactile cues    Comprehension  Verbalized understanding;Returned demonstration          PT Long Term Goals - 09/01/19 1648      PT LONG TERM GOAL #1   Title  The patient will be indep with HEP for neck stabilization, strengthening and flexibility.    Time  6    Period  Weeks    Status  On-going      PT LONG TERM GOAL #2   Title  The patient will reduce functional limitation per FOTO from 38% to < or equal to 30%.    Time  6    Period  Weeks    Status  On-going      PT LONG TERM GOAL #3   Title  The patient will report waking with bilateral arm/hand numbness < 1x/week to reduce frequency of sensory changes and less sleep disturbance.    Time  6    Period  Weeks    Status  On-going      PT LONG TERM GOAL #4   Title  The patient will demonstrate sleeping positions to demonstrate neutral spine.    Time  6    Period  Weeks    Status  On-going      PT LONG TERM GOAL #5   Title  The patient will improve L rotation supine to > or equal to 45 degrees.    Time  6    Period  Weeks    Status  Achieved            Plan - 09/11/19 1249    Clinical Impression Statement  Noted tightness in Lt upper traps and Rt suboccipital musculature.  Good response to manual work and traction.  Pt tolerated new light resistance exercises without any difficulty or symptoms.  Progressing towards LTGs.  Rehab Potential  Good    PT Frequency  2x / week    PT Duration  6 weeks    PT Treatment/Interventions  ADLs/Self Care Home  Management;Iontophoresis 4mg /ml Dexamethasone;Moist Heat;Ultrasound;Electrical Stimulation;Neuromuscular re-education;Therapeutic exercise;Therapeutic activities;Patient/family education;Taping;Dry needling;Manual techniques    PT Next Visit Plan  continue postural educatin/correction; consider trial of DN and/or mechanical traction(gentle pull/constant), continue neck ROM and postural strengthening.    PT Home Exercise Plan  Access Code: [email or web address]    Consulted and Agree with Plan of Care  Patient       Patient will benefit from skilled therapeutic intervention in order to improve the following deficits and impairments:  Decreased range of motion, Impaired sensation, Postural dysfunction, Hypomobility, Impaired flexibility, Pain  Visit Diagnosis: Cervicalgia  Abnormal posture  Other symptoms and signs involving the nervous system     Problem List Patient Active Problem List   Diagnosis Date Noted  . Cervical radiculopathy 08/25/2019  . History of esophagogastroduodenoscopy (EGD) 02/08/2018  . Colon cancer screening 02/08/2018  . Essential hypertension 12/25/2017  . GERD (gastroesophageal reflux disease) 08/30/2017  . Chronic diastolic (congestive) heart failure ([place]) 08/30/2017  . Paroxysmal atrial fibrillation ([place]) 08/30/2017  . Flushing 07/30/2017  . Paresthesia of both hands 07/30/2017  . 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 Aortic atherosclerosis ([place]) 04/28/2013  . S/P cataract extraction 01/16/2013  . Pseudophakia of both eyes 12/20/2012  . Schatzki's ring 10/16/2012  . Spinal stenosis of lumbar region 08/16/2012  . Vaginal atrophy 11/14/2011  . Graves' disease 05/05/2011  . Disc herniation   . Osteopenia   . PERSONAL HISTORY OF ALLERGY TO LATEX 12/23/2009  . UNSPECIFIED DISEASE OF PHARYNX 12/16/2008  . FATIGUE 11/10/2008  . Intermittent palpitations 11/10/2008  . Hypothyroidism 09/22/2008  . Hyperlipidemia 09/22/2008  . ALLERGIC RHINITIS 09/22/2008    Kerin Perna, PTA 09/11/19 1:00 PM  [place], [place], [address] Phone: [phone]   Fax:  [phone]  Name: Tayanna Brumit MRN: [id number] Date of Birth: 02/27/1951

## 2019-09-15 ENCOUNTER — Other Ambulatory Visit: Payer: Self-pay

## 2019-09-15 ENCOUNTER — Ambulatory Visit (INDEPENDENT_AMBULATORY_CARE_PROVIDER_SITE_OTHER): Payer: Medicare Other | Admitting: Physical Therapy

## 2019-09-15 DIAGNOSIS — M542 Cervicalgia: Secondary | ICD-10-CM | POA: Diagnosis not present

## 2019-09-15 DIAGNOSIS — R293 Abnormal posture: Secondary | ICD-10-CM

## 2019-09-15 DIAGNOSIS — R29818 Other symptoms and signs involving the nervous system: Secondary | ICD-10-CM | POA: Diagnosis not present

## 2019-09-15 NOTE — Therapy (Signed)
[place], [place], [address] Phone: [phone]   Fax:  [phone]  Physical Therapy Treatment  Patient Details  Name: Anne [name] MRN: [id number] Date of Birth: 1951/03/28 Referring Provider (PT): Beatrice Lecher, MD   Encounter Date: 09/15/2019  PT End of Session - 09/15/19 1148    Visit Number  7    Number of Visits  12    Date for PT Re-Evaluation  10/08/19    PT Start Time  1104    PT Stop Time  1140    PT Time Calculation (min)  36 min    Activity Tolerance  Patient tolerated treatment well    Behavior During Therapy  [facility] for tasks assessed/performed       Past Medical History:  Diagnosis Date  . Acid reflux   . Aortic atherosclerosis ([place]) 04/28/2013   CT 08/2017  . Arthritis    "joints, hands" (08/30/2017)  . Chronic lower back pain   . Disc herniation    neck and low back  . Family history of adverse reaction to anesthesia    "daughter w/PONV"  . Follicular cystitis   . Hiatal hernia   . High cholesterol   . Hyperlipidemia    ELEV CHOLESTEROL  . Hypothyroidism   . Osteopenia 2014   T score -1.6 FRAX 13%/1%  . PAF (paroxysmal atrial fibrillation) ([place]) 08/30/2017   Echo 2/19: EF 55-60, normal wall motion, grade 1 diastolic dysfunction, mild PI, trivial TR //  Nuc 3/19: no ischemia or scar, EF 80 // CHADS2-VASc: 3 // Eliquis 5 bid   . Schatzki's ring     Past Surgical History:  Procedure Laterality Date  . BREAST BIOPSY Bilateral 1997   "excisional bx both benign"  . BREAST CYST ASPIRATION Right ~ 1993  . CATARACT EXTRACTION W/ INTRAOCULAR LENS IMPLANT Left 2011  . CATARACT EXTRACTION W/ INTRAOCULAR LENS IMPLANT Right 12/20/2012   Dr. Haynes Bast, OD  . COLONOSCOPY WITH ESOPHAGOGASTRODUODENOSCOPY (EGD)  "several"   "? dilated esophagus" (08/30/2017)  . DILATION AND CURETTAGE OF UTERUS  1996  . EYE MUSCLE SURGERY Bilateral 1992  . HERNIA REPAIR    .  HYSTEROSCOPY    . LAPAROSCOPIC CHOLECYSTECTOMY  09/28/2011  . OOPHORECTOMY Right 04/2000   LAP RSO/LYSIS OF ADHESIONS  . THYROIDECTOMY  1976  . TUBAL LIGATION  1976  . VARICOSE VEIN SURGERY Bilateral 1972  . VENTRAL HERNIA REPAIR  06/23/13   Dr. Franz Dell    There were no vitals filed for this visit.  Subjective Assessment - 09/15/19 1237    Subjective  Pt reports she didn't sleep well, since she is a mouth breather at night and her mouth was drying out all night. No change in tingling in neck and arms upon waking.    Diagnostic tests  From 03/2018  IMPRESSION:1. 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 Slight anterolisthesis of C3 on C4 and C4 on C5 by 2 mm mostlikely degenerative in origin.3. Mild foraminal narrowing at C3-4 and C6-7.    Patient Stated Goals  Sleep through the night.    Currently in Pain?  No/denies    Pain Score  0-No pain         [facility] PT Assessment - 09/15/19 0001      Assessment   Medical Diagnosis  cervical pain, cervical radiculopathy    Referring Provider (PT)  Beatrice Lecher, MD    Onset Date/Surgical Date  08/25/19    Hand Dominance  Right    Prior Therapy  none      AROM   Cervical Flexion  52    Cervical Extension  45    Cervical - Right Side Bend  28    Cervical - Left Side Bend  25    Cervical - Right Rotation  50    Cervical - Left Rotation  45       [facility] Adult PT Treatment/Exercise - 09/15/19 0001      Neck Exercises: Seated   Cervical Rotation  Right;Left;5 reps    Lateral Flexion  Right;Left   2 reps, 15 sec hold stretch   Other Seated Exercise  resisted rows x 10 with yellow band, x 10 with red band; bilat ER with red band x 10,  bilat shoulder ext with yellow band x 10 - cue      Neck Exercises: Supine   Neck Retraction  5 reps;3 secs    Other Supine Exercise  single knee to chest x 15 sec x 2 reps each side.       Neck Exercises: Sidelying   Other Sidelying Exercise  open book x 5 reps each side.       Neck Exercises:  Prone   Axial Exension  5 reps    Axial Extension Limitations  increased low back pain, stopped and stretched knee to chest       Manual Therapy   Manual Therapy  Passive ROM;Soft tissue mobilization;Myofascial release    Soft tissue mobilization  STM work through posterior cervical musculature, upper trap, scalenes, levator scapulae    Myofascial Release  MFR to Lt/Rt scalenes and platysma, suboccipital release     Passive ROM  Lt lateral flexion and rotation, flexion, flexion with rotation.     Manual Traction  gentle cervical traction - pt tenses; stopped.       Neck Exercises: Stretches   Other Neck Stretches  midlevel doorway stretch x 15 sec x 3 reps                   PT Long Term Goals - 09/15/19 1150      PT LONG TERM GOAL #1   Title  The patient will be indep with HEP for neck stabilization, strengthening and flexibility.    Time  6    Period  Weeks    Status  On-going      PT LONG TERM GOAL #2   Title  The patient will reduce functional limitation per FOTO from 38% to < or equal to 30%.    Time  6    Period  Weeks    Status  On-going      PT LONG TERM GOAL #3   Title  The patient will report waking with bilateral arm/hand numbness < 1x/week to reduce frequency of sensory changes and less sleep disturbance.    Time  6    Period  Weeks    Status  On-going      PT LONG TERM GOAL #4   Title  The patient will demonstrate sleeping positions to demonstrate neutral spine.    Time  6    Period  Weeks    Status  On-going      PT LONG TERM GOAL #5   Title  The patient will improve L rotation supine to > or equal to 45 degrees.    Time  6    Period  Weeks    Status  Achieved  Plan - 09/15/19 1159    Clinical Impression Statement  Pt continues with limited cervical ROM.  She was unable to tolerate axial extension due to low back pain with lifting head.  LBP resolved with change in position and single knee to chest stretch.  Pt making very  gradual progress towards LTGs.    Rehab Potential  Good    PT Frequency  2x / week    PT Duration  6 weeks    PT Treatment/Interventions  ADLs/Self Care Home Management;Iontophoresis 4mg /ml Dexamethasone;Moist Heat;Ultrasound;Electrical Stimulation;Neuromuscular re-education;Therapeutic exercise;Therapeutic activities;Patient/family education;Taping;Dry needling;Manual techniques    PT Next Visit Plan  manual therapy and postural strengthening.    PT Home Exercise Plan  Access Code: [email or web address]    Consulted and Agree with Plan of Care  Patient       Patient will benefit from skilled therapeutic intervention in order to improve the following deficits and impairments:  Decreased range of motion, Impaired sensation, Postural dysfunction, Hypomobility, Impaired flexibility, Pain  Visit Diagnosis: Cervicalgia  Abnormal posture  Other symptoms and signs involving the nervous system     Problem List Patient Active Problem List   Diagnosis Date Noted  . Cervical radiculopathy 08/25/2019  . History of esophagogastroduodenoscopy (EGD) 02/08/2018  . Colon cancer screening 02/08/2018  . Essential hypertension 12/25/2017  . GERD (gastroesophageal reflux disease) 08/30/2017  . Chronic diastolic (congestive) heart failure ([place]) 08/30/2017  . Paroxysmal atrial fibrillation ([place]) 08/30/2017  . Flushing 07/30/2017  . Paresthesia of both hands 07/30/2017  . 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 Aortic atherosclerosis ([place]) 04/28/2013  . S/P cataract extraction 01/16/2013  . Pseudophakia of both eyes 12/20/2012  . Schatzki's ring 10/16/2012  . Spinal stenosis of lumbar region 08/16/2012  . Vaginal atrophy 11/14/2011  . Graves' disease 05/05/2011  . Disc herniation   . Osteopenia   . PERSONAL HISTORY OF ALLERGY TO LATEX 12/23/2009  . UNSPECIFIED DISEASE OF PHARYNX 12/16/2008  . FATIGUE 11/10/2008  . Intermittent palpitations 11/10/2008  . Hypothyroidism 09/22/2008  . Hyperlipidemia  09/22/2008  . ALLERGIC RHINITIS 09/22/2008   Kerin Perna, PTA 09/15/19 12:54 PM  [place], [place], [address] Phone: [phone]   Fax:  [phone]  Name: Anne [name] MRN: [id number] Date of Birth: 05-02-1951

## 2019-09-18 ENCOUNTER — Other Ambulatory Visit: Payer: Self-pay

## 2019-09-18 ENCOUNTER — Encounter: Payer: Self-pay | Admitting: Rehabilitative and Restorative Service Providers"

## 2019-09-18 ENCOUNTER — Ambulatory Visit (INDEPENDENT_AMBULATORY_CARE_PROVIDER_SITE_OTHER): Payer: Medicare Other | Admitting: Rehabilitative and Restorative Service Providers"

## 2019-09-18 DIAGNOSIS — M542 Cervicalgia: Secondary | ICD-10-CM

## 2019-09-18 DIAGNOSIS — R29818 Other symptoms and signs involving the nervous system: Secondary | ICD-10-CM | POA: Diagnosis not present

## 2019-09-18 DIAGNOSIS — R293 Abnormal posture: Secondary | ICD-10-CM | POA: Diagnosis not present

## 2019-09-18 IMAGING — DX DG CHEST 2V
2 series · 2 of 2 positions shown · non-contrast
Comparison: None.

CLINICAL DATA: 66 year-old female was dx w/ sinusitis and treated
w/ abx but continues to have a cough

EXAM:
CHEST  2 VIEW

[chest pa]
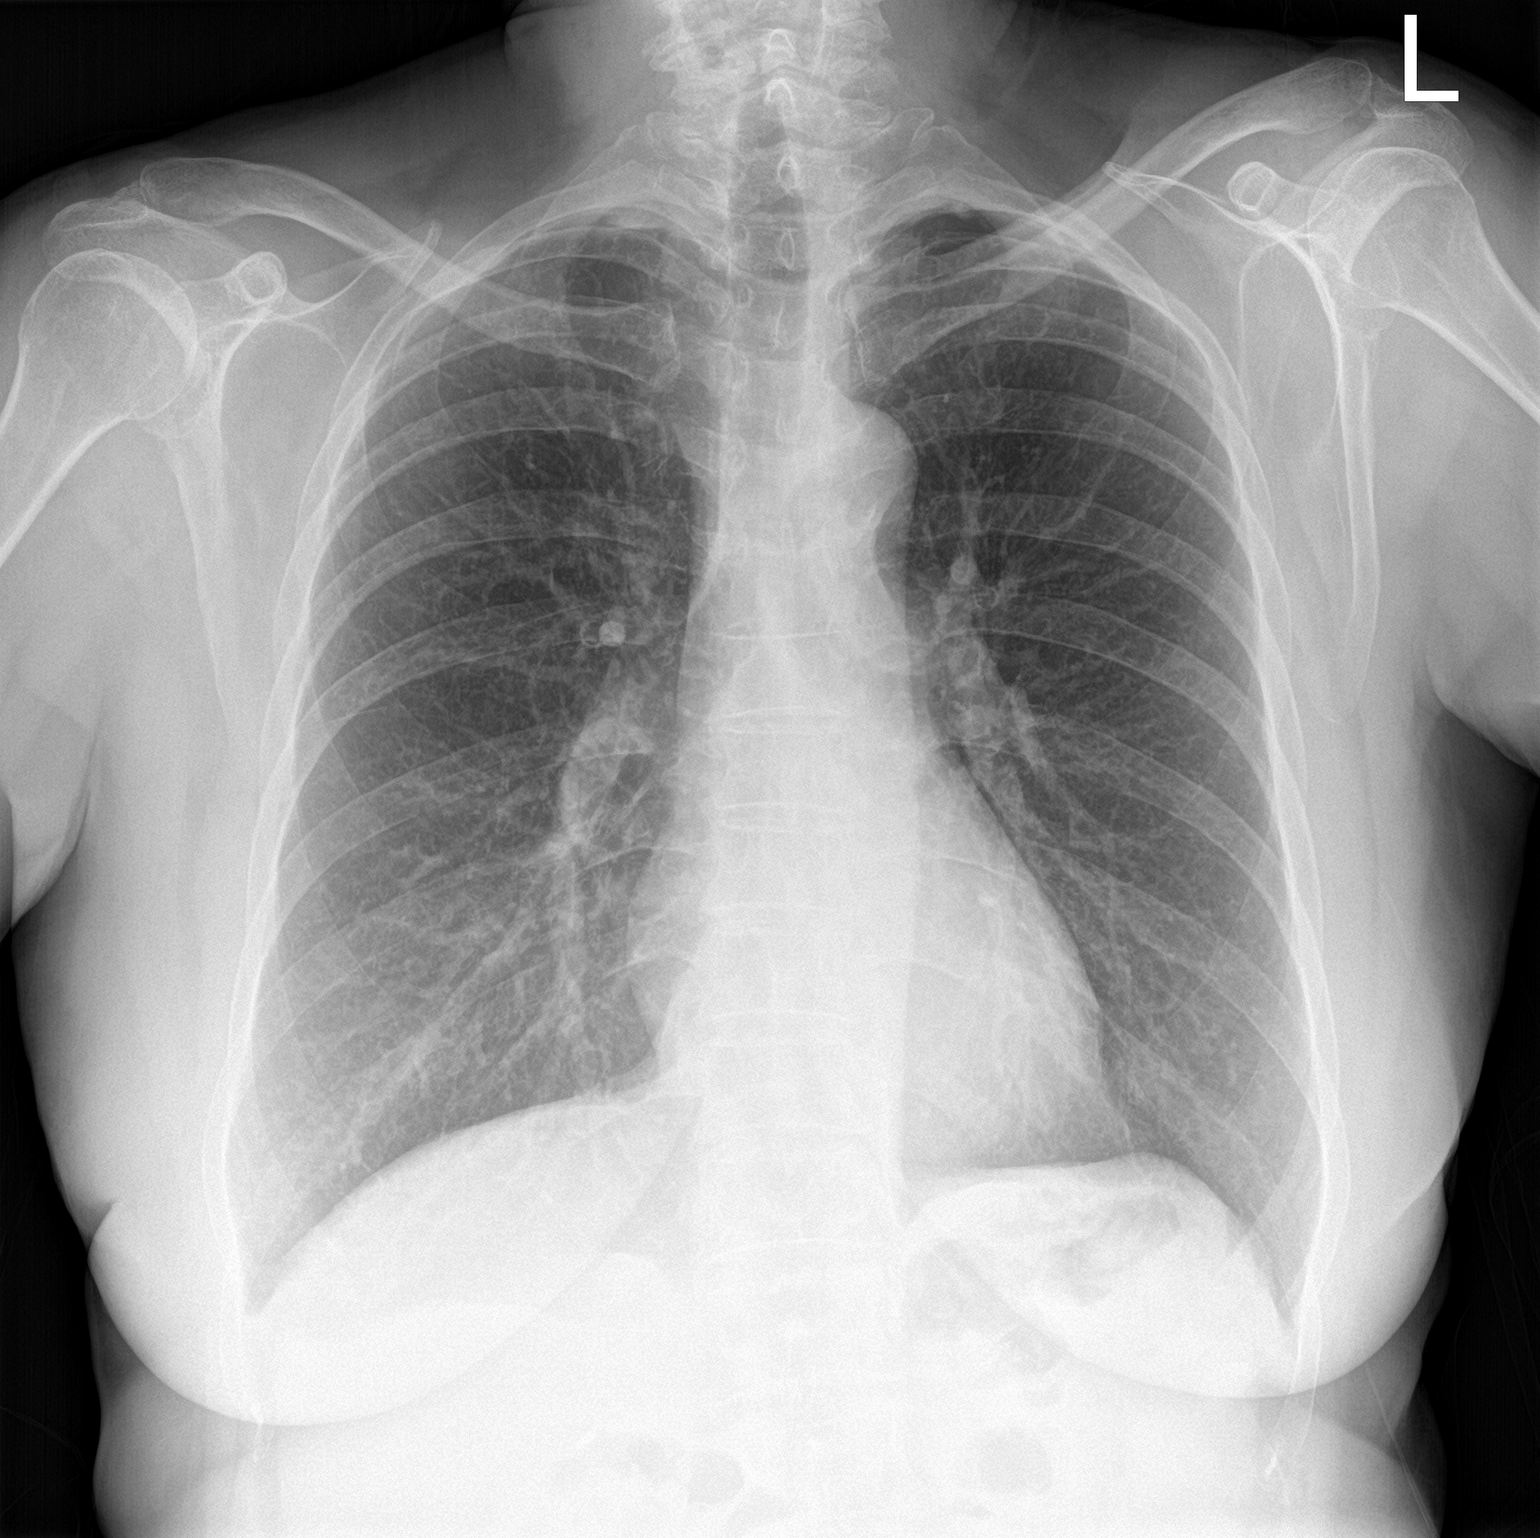

[chest lat]
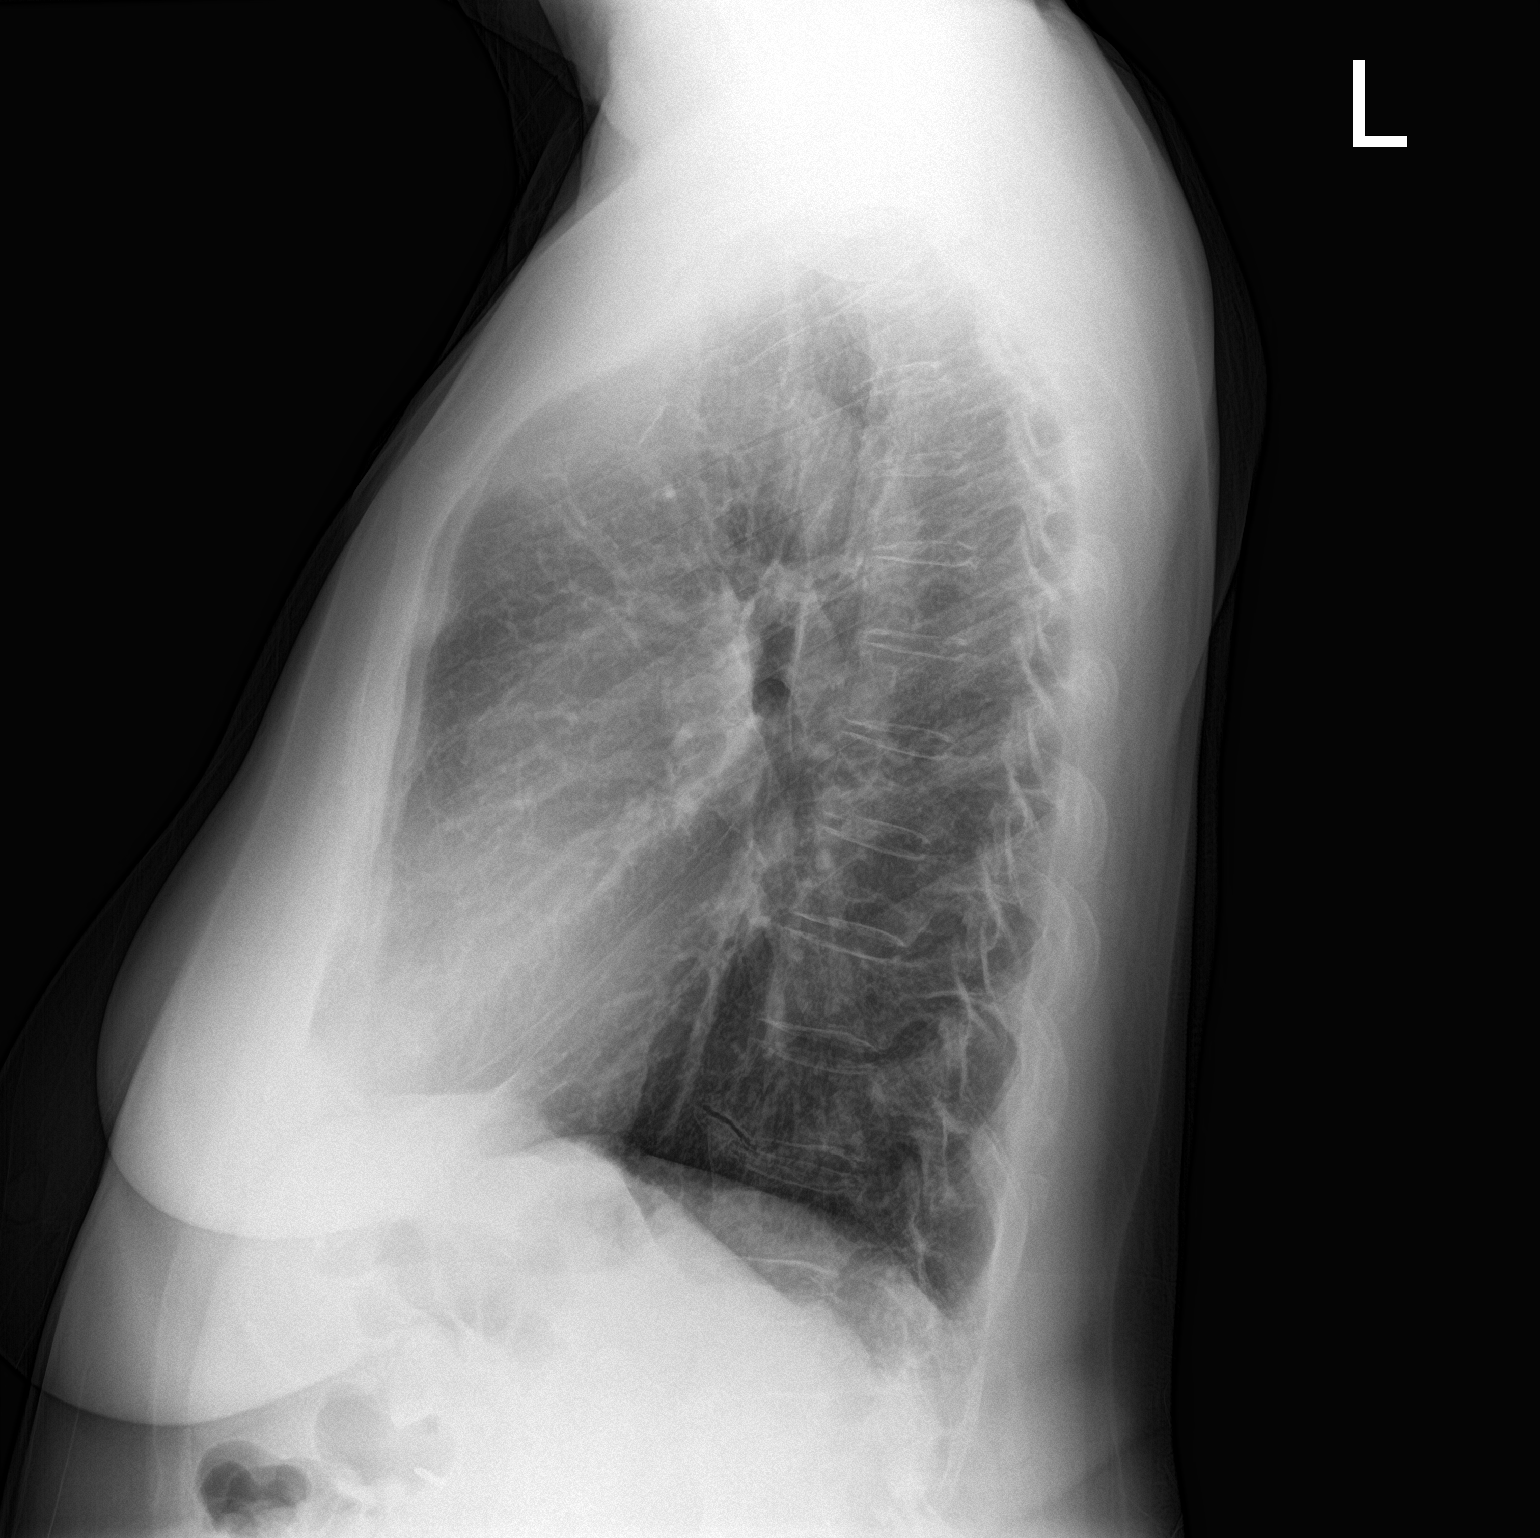

[2 of 2 positions shown; findings below may reference images not displayed]

FINDINGS: Heart size and mediastinal contours are within normal limits. Lungs
are clear. No pleural effusion or pneumothorax seen. Osseous
structures about the chest are unremarkable.
IMPRESSION: No active cardiopulmonary disease. No evidence of pneumonia or
pulmonary edema.

## 2019-09-18 NOTE — Patient Instructions (Signed)
Access Code: [email or web address] URL: [email or web address] Date: 09/18/2019 Prepared by: Rudell Cobb  Exercises Supine Chin Tuck - 2 x daily - 7 x weekly - 10 reps - 1 sets - 3 seconds hold Supine Thoracic Mobilization Towel Roll Vertical with Arm Stretch - 2 x daily - 7 x weekly - 1 reps - 1 sets - 2 minutes hold Seated Gentle Upper Trapezius Stretch - 1-2 x daily - 7 x weekly - 3 reps - 1 sets - 15 second hold Standing Scapular Retraction - 2 x daily - 7 x weekly - 5 reps - 1 sets Doorway Pec Stretch at 60 Degrees Abduction with Arm Straight - 2 x daily - 7 x weekly - 3 reps - 1 sets - 15-20 seconds hold Doorway Pec Stretch at 90 Degrees Abduction - 1 x daily - 7 x weekly - 2 reps - 1 sets - 15 hold Standing Row with Anchored Resistance - 1 x daily - 7 x weekly - 2 sets - 10 reps Shoulder Extension with Resistance - 1 x daily - 7 x weekly - 2 sets - 10 reps Shoulder External Rotation and Scapular Retraction with Resistance - 1 x daily - 7 x weekly - 2 sets - 10 reps Quadruped Alternating Arm Lift - 2 x daily - 7 x weekly - 1 sets - 10 reps Prone Press Up - 2 x daily - 7 x weekly - 1 sets - 10 reps

## 2019-09-18 NOTE — Therapy (Signed)
[place], [place], [address] Phone: [phone]   Fax:  [phone]  Physical Therapy Treatment  Patient Details  Name: Anne [name] MRN: [id number] Date of Birth: 1951/03/02 Referring Provider (PT): Beatrice Lecher, MD   Encounter Date: 09/18/2019  PT End of Session - 09/18/19 1105    Visit Number  8    Number of Visits  12    Date for PT Re-Evaluation  10/08/19    PT Start Time  1100    PT Stop Time  1148    PT Time Calculation (min)  48 min    Activity Tolerance  Patient tolerated treatment well    Behavior During Therapy  [facility] for tasks assessed/performed       Past Medical History:  Diagnosis Date  . Acid reflux   . Aortic atherosclerosis ([place]) 04/28/2013   CT 08/2017  . Arthritis    "joints, hands" (08/30/2017)  . Chronic lower back pain   . Disc herniation    neck and low back  . Family history of adverse reaction to anesthesia    "daughter w/PONV"  . Follicular cystitis   . Hiatal hernia   . High cholesterol   . Hyperlipidemia    ELEV CHOLESTEROL  . Hypothyroidism   . Osteopenia 2014   T score -1.6 FRAX 13%/1%  . PAF (paroxysmal atrial fibrillation) ([place]) 08/30/2017   Echo 2/19: EF 55-60, normal wall motion, grade 1 diastolic dysfunction, mild PI, trivial TR //  Nuc 3/19: no ischemia or scar, EF 80 // CHADS2-VASc: 3 // Eliquis 5 bid   . Schatzki's ring     Past Surgical History:  Procedure Laterality Date  . BREAST BIOPSY Bilateral 1997   "excisional bx both benign"  . BREAST CYST ASPIRATION Right ~ 1993  . CATARACT EXTRACTION W/ INTRAOCULAR LENS IMPLANT Left 2011  . CATARACT EXTRACTION W/ INTRAOCULAR LENS IMPLANT Right 12/20/2012   Dr. Haynes Bast, OD  . COLONOSCOPY WITH ESOPHAGOGASTRODUODENOSCOPY (EGD)  "several"   "? dilated esophagus" (08/30/2017)  . DILATION AND CURETTAGE OF UTERUS  1996  . EYE MUSCLE SURGERY Bilateral 1992  . HERNIA REPAIR    .  HYSTEROSCOPY    . LAPAROSCOPIC CHOLECYSTECTOMY  09/28/2011  . OOPHORECTOMY Right 04/2000   LAP RSO/LYSIS OF ADHESIONS  . THYROIDECTOMY  1976  . TUBAL LIGATION  1976  . VARICOSE VEIN SURGERY Bilateral 1972  . VENTRAL HERNIA REPAIR  06/23/13   Dr. Franz Dell    There were no vitals filed for this visit.  Subjective Assessment - 09/18/19 1101    Subjective  The patient reports she is continuing with numbness and tingling in her jaw, neck, shoulders radiating into the fingers.  It happens almost every night estimating 5x/week.  Symptoms last for one hour once she gets up.  She cannot stay in bed b/c it feels intense.  She feels more energized in her arms and feels like she is moving better.    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    Diagnostic tests  From 03/2018  IMPRESSION:1. 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 Slight anterolisthesis of C3 on C4 and C4 on C5 by 2 mm mostlikely degenerative in origin.3. Mild foraminal narrowing at C3-4 and C6-7.    Patient Stated Goals  Sleep through the night.    Currently in Pain?  No/denies    Pain Descriptors / Indicators  Tingling;Numbness  not pain                      [facility] Adult PT Treatment/Exercise - 09/18/19 1107      Exercises   Exercises  Neck      Neck Exercises: Machines for Strengthening   UBE (Upper Arm Bike)  L2: 2.75 forward, 1.25 backwards      Neck Exercises: Standing   Other Standing Exercises  Wall press up x 10 reps    Other Standing Exercises  Elbow propping with rotation R and L sides      Neck Exercises: Seated   Cervical Isometrics  Right rotation;Left rotation;3 secs;5 reps    Cervical Isometrics Limitations  feels more pressure going to R than L    Neck Retraction  10 reps    W Back  10 reps      Neck Exercises: Prone   Other Prone Exercise  Quadriped UE alternating shoulder flexion x 10 reps with core engagement and postural cues.     Other Prone Exercise  Prone press ups for thoracic extension      Manual Therapy   Manual Therapy  Soft tissue mobilization;Joint mobilization    Joint Mobilization  thoracic (mid and upper) PA, rotation and lateral gliding grade I-II due to pain in thoracic region with gentle pressure    Soft tissue mobilization  STM through suboccipitals and bilateral scalenes             PT Education - 09/18/19 1147    Education Details  HEP    Person(s) Educated  Patient    Methods  Explanation;Demonstration;Handout    Comprehension  Verbalized understanding;Returned demonstration          PT Long Term Goals - 09/15/19 1150      PT LONG TERM GOAL #1   Title  The patient will be indep with HEP for neck stabilization, strengthening and flexibility.    Time  6    Period  Weeks    Status  On-going      PT LONG TERM GOAL #2   Title  The patient will reduce functional limitation per FOTO from 38% to < or equal to 30%.    Time  6    Period  Weeks    Status  On-going      PT LONG TERM GOAL #3   Title  The patient will report waking with bilateral arm/hand numbness < 1x/week to reduce frequency of sensory changes and less sleep disturbance.    Time  6    Period  Weeks    Status  On-going      PT LONG TERM GOAL #4   Title  The patient will demonstrate sleeping positions to demonstrate neutral spine.    Time  6    Period  Weeks    Status  On-going      PT LONG TERM GOAL #5   Title  The patient will improve L rotation supine to > or equal to 45 degrees.    Time  6    Period  Weeks    Status  Achieved            Plan - 09/18/19 1105    Clinical Impression Statement  Although patient reports greater strength and postural improvement, she continues with night waking.  This appears multi-factorial as she notes difficulty with dry mouth due to mouth breathing and also a sensation of nose feeling "closed off" when lies  down supine.  PT discussed side sleepng and using a body  pillow behind her back to help her avoid supine lying since this seems to make mouth breathing and sensation of nose closing off worse.  Patient to work on HEP for strengthening and return in 2 weeks where we will reassess and determine need for further visits or d/c.    Rehab Potential  Good    PT Frequency  2x / week    PT Duration  6 weeks    PT Treatment/Interventions  ADLs/Self Care Home Management;Iontophoresis 4mg /ml Dexamethasone;Moist Heat;Ultrasound;Electrical Stimulation;Neuromuscular re-education;Therapeutic exercise;Therapeutic activities;Patient/family education;Taping;Dry needling;Manual techniques    PT Next Visit Plan  check LTGs and FOTO; manual therapy and postural strengthening.    PT Home Exercise Plan  Access Code: [email or web address]    Consulted and Agree with Plan of Care  Patient       Patient will benefit from skilled therapeutic intervention in order to improve the following deficits and impairments:  Decreased range of motion, Impaired sensation, Postural dysfunction, Hypomobility, Impaired flexibility, Pain  Visit Diagnosis: Abnormal posture  Cervicalgia  Other symptoms and signs involving the nervous system     Problem List Patient Active Problem List   Diagnosis Date Noted  . Cervical radiculopathy 08/25/2019  . History of esophagogastroduodenoscopy (EGD) 02/08/2018  . Colon cancer screening 02/08/2018  . Essential hypertension 12/25/2017  . GERD (gastroesophageal reflux disease) 08/30/2017  . Chronic diastolic (congestive) heart failure ([place]) 08/30/2017  . Paroxysmal atrial fibrillation ([place]) 08/30/2017  . Flushing 07/30/2017  . Paresthesia of both hands 07/30/2017  . 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 Aortic atherosclerosis ([place]) 04/28/2013  . S/P cataract extraction 01/16/2013  . Pseudophakia of both eyes 12/20/2012  . Schatzki's ring 10/16/2012  . Spinal stenosis of lumbar region 08/16/2012  . Vaginal atrophy 11/14/2011  . Graves' disease  05/05/2011  . Disc herniation   . Osteopenia   . PERSONAL HISTORY OF ALLERGY TO LATEX 12/23/2009  . UNSPECIFIED DISEASE OF PHARYNX 12/16/2008  . FATIGUE 11/10/2008  . Intermittent palpitations 11/10/2008  . Hypothyroidism 09/22/2008  . Hyperlipidemia 09/22/2008  . ALLERGIC RHINITIS 09/22/2008    Rosalina Dingwall, PT 09/18/2019, 1:25 PM  [facility] [place], [place], [address] Phone: [phone]   Fax:  [phone]  Name: Anne [name] MRN: [id number] Date of Birth: 07/07/1950

## 2019-10-02 ENCOUNTER — Ambulatory Visit (INDEPENDENT_AMBULATORY_CARE_PROVIDER_SITE_OTHER): Payer: Medicare Other | Admitting: Rehabilitative and Restorative Service Providers"

## 2019-10-02 ENCOUNTER — Other Ambulatory Visit: Payer: Self-pay

## 2019-10-02 DIAGNOSIS — M542 Cervicalgia: Secondary | ICD-10-CM

## 2019-10-02 DIAGNOSIS — R29818 Other symptoms and signs involving the nervous system: Secondary | ICD-10-CM | POA: Diagnosis not present

## 2019-10-02 DIAGNOSIS — R293 Abnormal posture: Secondary | ICD-10-CM | POA: Diagnosis not present

## 2019-10-02 NOTE — Therapy (Signed)
[place], [place], [address] Phone: [phone]   Fax:  [phone]  Physical Therapy Treatment and Discharge Summary  Patient Details  Name: Anne [name] MRN: [id number] Date of Birth: 04-18-1951 Referring Provider (PT): Beatrice Lecher, MD   Encounter Date: 10/02/2019  PT End of Session - 10/02/19 1114    Visit Number  9    Number of Visits  12    Date for PT Re-Evaluation  10/08/19    PT Start Time  1101    PT Stop Time  1142    PT Time Calculation (min)  41 min    Activity Tolerance  Patient tolerated treatment well    Behavior During Therapy  [facility] for tasks assessed/performed       Past Medical History:  Diagnosis Date  . Acid reflux   . Aortic atherosclerosis ([place]) 04/28/2013   CT 08/2017  . Arthritis    "joints, hands" (08/30/2017)  . Chronic lower back pain   . Disc herniation    neck and low back  . Family history of adverse reaction to anesthesia    "daughter w/PONV"  . Follicular cystitis   . Hiatal hernia   . High cholesterol   . Hyperlipidemia    ELEV CHOLESTEROL  . Hypothyroidism   . Osteopenia 2014   T score -1.6 FRAX 13%/1%  . PAF (paroxysmal atrial fibrillation) ([place]) 08/30/2017   Echo 2/19: EF 55-60, normal wall motion, grade 1 diastolic dysfunction, mild PI, trivial TR //  Nuc 3/19: no ischemia or scar, EF 80 // CHADS2-VASc: 3 // Eliquis 5 bid   . Schatzki's ring     Past Surgical History:  Procedure Laterality Date  . BREAST BIOPSY Bilateral 1997   "excisional bx both benign"  . BREAST CYST ASPIRATION Right ~ 1993  . CATARACT EXTRACTION W/ INTRAOCULAR LENS IMPLANT Left 2011  . CATARACT EXTRACTION W/ INTRAOCULAR LENS IMPLANT Right 12/20/2012   Dr. Haynes Bast, OD  . COLONOSCOPY WITH ESOPHAGOGASTRODUODENOSCOPY (EGD)  "several"   "? dilated esophagus" (08/30/2017)  . DILATION AND CURETTAGE OF UTERUS  1996  . EYE MUSCLE SURGERY Bilateral 1992  .  HERNIA REPAIR    . HYSTEROSCOPY    . LAPAROSCOPIC CHOLECYSTECTOMY  09/28/2011  . OOPHORECTOMY Right 04/2000   LAP RSO/LYSIS OF ADHESIONS  . THYROIDECTOMY  1976  . TUBAL LIGATION  1976  . VARICOSE VEIN SURGERY Bilateral 1972  . VENTRAL HERNIA REPAIR  06/23/13   Dr. Franz Dell    There were no vitals filed for this visit.  Subjective Assessment - 10/02/19 1103    Subjective  The patient reports she did not tolerate the prone press up or the quadriped exercise because of her knees and low back.    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    Diagnostic tests  From 03/2018  IMPRESSION:1. 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 Slight anterolisthesis of C3 on C4 and C4 on C5 by 2 mm mostlikely degenerative in origin.3. Mild foraminal narrowing at C3-4 and C6-7.    Patient Stated Goals  Sleep through the night.    Currently in Pain?  No/denies         [facility] PT Assessment - 10/02/19 1120      ROM / Strength   AROM / PROM / Strength  AROM      AROM   Cervical - Right Rotation  75  Cervical - Left Rotation  68                   [facility] Adult PT Treatment/Exercise - 10/02/19 1101      Self-Care   Self-Care  Other Self-Care Comments    Other Self-Care Comments   discussed at length home positioning and patient able to comfortably get to sleep, but continues to wake after 4 hours.  Tingling and difficulty swallowing + dry mouth are present even when sleeping in recliner.      Exercises   Exercises  Neck      Neck Exercises: Machines for Strengthening   UBE (Upper Arm Bike)  L2:  2 minutes forward/ 1 minute backwards      Neck Exercises: Standing   Other Standing Exercises  scapular retraction x 10 reps,     Other Standing Exercises  door way stretch 60 degrees and 90 degrees      Neck Exercises: Supine   Neck Retraction  5 reps;3 secs    Other Supine Exercise  towel roll stretch supine for thoracic extension              PT Education - 10/02/19 1302    Education Details  reviewed current HEP for d/c and removed prone press up and quadriped    Person(s) Educated  Patient    Methods  Explanation;Demonstration;Handout    Comprehension  Returned demonstration;Verbalized understanding          PT Long Term Goals - 10/02/19 1111      PT LONG TERM GOAL #1   Title  The patient will be indep with HEP for neck stabilization, strengthening and flexibility.    Time  6    Period  Weeks    Status  Achieved      PT LONG TERM GOAL #2   Title  The patient will reduce functional limitation per FOTO from 38% to < or equal to 30%.    Baseline  scores 76% (24% limited)    Time  6    Period  Weeks    Status  Achieved      PT LONG TERM GOAL #3   Title  The patient will report waking with bilateral arm/hand numbness < 1x/week to reduce frequency of sensory changes and less sleep disturbance.    Baseline  this symtpoms continues every night; appears multi-factorial in nature    Time  6    Period  Weeks    Status  Not Met      PT LONG TERM GOAL #4   Title  The patient will demonstrate sleeping positions to demonstrate neutral spine.    Time  6    Period  Weeks    Status  Achieved      PT LONG TERM GOAL #5   Title  The patient will improve L rotation supine to > or equal to 45 degrees.    Baseline  improved seated AROM rotation L to 70 degrees and R to 68 degrees    Time  6    Period  Weeks    Status  Achieved            Plan - 10/02/19 1108    Clinical Impression Statement  The patient met 4 out of 5 long term goals.  She reports "I can tell I have more range of motion" and also notes posture improved.  She continues with cc of night waking with symptoms of dry mouth, difficulty  swallowing, and tingling in both arms and hands.  She does not have neck pain during the day.  She feels sick to her stomach in those moments and it passes within minutes.  Tingling occurs behind her ears,  into her jaws, into her chest and ribs, into her shoulders and into her arms and hands.  The patient notes no change in night waking and tingling in extremities with exercises and physical therapy.    PT Treatment/Interventions  ADLs/Self Care Home Management;Iontophoresis 56m/ml Dexamethasone;Moist Heat;Ultrasound;Electrical Stimulation;Neuromuscular re-education;Therapeutic exercise;Therapeutic activities;Patient/family education;Taping;Dry needling;Manual techniques    PT Next Visit Plan  discharge today with HEP    PT Home Exercise Plan  Access Code: [email or web address]    Consulted and Agree with Plan of Care  Patient       Patient will benefit from skilled therapeutic intervention in order to improve the following deficits and impairments:  Decreased range of motion, Impaired sensation, Postural dysfunction, Hypomobility, Impaired flexibility, Pain  Visit Diagnosis: Abnormal posture  Cervicalgia  Other symptoms and signs involving the nervous system  PHYSICAL THERAPY DISCHARGE SUMMARY  Visits from Start of Care: 9  Current functional level related to goals / functional outcomes: See goals above   Remaining deficits: No pain during day or night, however continues with sensory change of tingling that wakes her at night combined with difficulty swallowing and dry mouth, with some nausea associated with these episodes.   Education / Equipment: HEP, posture  Plan: Patient agrees to discharge.  Patient goals were met. Patient is being discharged due to meeting the stated rehab goals.  ?????        Problem List Patient Active Problem List   Diagnosis Date Noted  . Cervical radiculopathy 08/25/2019  . History of esophagogastroduodenoscopy (EGD) 02/08/2018  . Colon cancer screening 02/08/2018  . Essential hypertension 12/25/2017  . GERD (gastroesophageal reflux disease) 08/30/2017  . Chronic diastolic (congestive) heart failure ([place] 08/30/2017  . Paroxysmal atrial fibrillation ([place]  08/30/2017  . Flushing 07/30/2017  . Paresthesia of both hands 07/30/2017  . 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 Aortic atherosclerosis ([place] 04/28/2013  . S/P cataract extraction 01/16/2013  . Pseudophakia of both eyes 12/20/2012  . Schatzki's ring 10/16/2012  . Spinal stenosis of lumbar region 08/16/2012  . Vaginal atrophy 11/14/2011  . Graves' disease 05/05/2011  . Disc herniation   . Osteopenia   . PERSONAL HISTORY OF ALLERGY TO LATEX 12/23/2009  . UNSPECIFIED DISEASE OF PHARYNX 12/16/2008  . FATIGUE 11/10/2008  . Intermittent palpitations 11/10/2008  . Hypothyroidism 09/22/2008  . Hyperlipidemia 09/22/2008  . ALLERGIC RHINITIS 09/22/2008    Thank you for the referral of this patient. CRudell Cobb MPT  [facility] 10/02/2019, 1:06 PM  [facility] [address]Phone: [phone]  Fax:  [phone] Name: SCorena TilsonMRN: [id number] of Birth: [birth year]

## 2019-12-09 DIAGNOSIS — E039 Hypothyroidism, unspecified: Secondary | ICD-10-CM | POA: Diagnosis not present

## 2019-12-09 LAB — TSH: TSH: 0.85 mIU/L (ref 0.40–4.50)

## 2019-12-10 NOTE — Progress Notes (Signed)
All labs are normal. 

## 2020-01-22 ENCOUNTER — Encounter: Payer: Self-pay | Admitting: Family Medicine

## 2020-01-22 ENCOUNTER — Ambulatory Visit (INDEPENDENT_AMBULATORY_CARE_PROVIDER_SITE_OTHER): Payer: Medicare Other | Admitting: Family Medicine

## 2020-01-22 ENCOUNTER — Other Ambulatory Visit: Payer: Self-pay

## 2020-01-22 ENCOUNTER — Other Ambulatory Visit: Payer: Self-pay | Admitting: Family Medicine

## 2020-01-22 DIAGNOSIS — L219 Seborrheic dermatitis, unspecified: Secondary | ICD-10-CM | POA: Diagnosis not present

## 2020-01-22 DIAGNOSIS — Z1231 Encounter for screening mammogram for malignant neoplasm of breast: Secondary | ICD-10-CM

## 2020-01-22 MED ORDER — CLOBETASOL PROPIONATE 0.05 % EX SHAM: MEDICATED_SHAMPOO | CUTANEOUS | 0 refills | Status: DC

## 2020-01-22 NOTE — Progress Notes (Signed)
Anne [name] - 69 y.o. female MRN [id number]  Date of birth: 05-May-1951  Subjective Chief Complaint  Patient presents with  . Hair/Scalp Problem    HPI Anne [name] is a 69 y.o. female here today with complaint of scalp irritation.  This started a few weeks ago.  Having some itching and mild pain.  She also has some itching in her ears.  She denies hair loss, swelling or drainage of the scalp.  She has not changed hair products recently.    ROS:  A comprehensive ROS was completed and negative except as noted per HPI  Allergies  Allergen Reactions  . Amoxicillin-Pot Clavulanate     REACTION: blisters in colon (Augmentin)  . Ceftriaxone Sodium     REACTION: throat swells (ROCEPHIN)  . Cephalexin   . Codeine     REACTION: nausea  . Molds & Smuts   . Zofran [Ondansetron Hcl]     Headaches    Past Medical History:  Diagnosis Date  . Acid reflux   . Aortic atherosclerosis ([place]) 04/28/2013   CT 08/2017  . Arthritis    "joints, hands" (08/30/2017)  . Chronic lower back pain   . Disc herniation    neck and low back  . Family history of adverse reaction to anesthesia    "daughter w/PONV"  . Follicular cystitis   . Hiatal hernia   . High cholesterol   . Hyperlipidemia    ELEV CHOLESTEROL  . Hypothyroidism   . Osteopenia 2014   T score -1.6 FRAX 13%/1%  . PAF (paroxysmal atrial fibrillation) ([place]) 08/30/2017   Echo 2/19: EF 55-60, normal wall motion, grade 1 diastolic dysfunction, mild PI, trivial TR //  Nuc 3/19: no ischemia or scar, EF 80 // CHADS2-VASc: 3 // Eliquis 5 bid   . Schatzki's ring     Past Surgical History:  Procedure Laterality Date  . BREAST BIOPSY Bilateral 1997   "excisional bx both benign"  . BREAST CYST ASPIRATION Right ~ 1993  . CATARACT EXTRACTION W/ INTRAOCULAR LENS IMPLANT Left 2011  . CATARACT EXTRACTION W/ INTRAOCULAR LENS IMPLANT Right 12/20/2012   Dr. Haynes Bast, OD  . COLONOSCOPY WITH ESOPHAGOGASTRODUODENOSCOPY (EGD)   "several"   "? dilated esophagus" (08/30/2017)  . DILATION AND CURETTAGE OF UTERUS  1996  . EYE MUSCLE SURGERY Bilateral 1992  . HERNIA REPAIR    . HYSTEROSCOPY    . LAPAROSCOPIC CHOLECYSTECTOMY  09/28/2011  . OOPHORECTOMY Right 04/2000   LAP RSO/LYSIS OF ADHESIONS  . THYROIDECTOMY  1976  . TUBAL LIGATION  1976  . VARICOSE VEIN SURGERY Bilateral 1972  . VENTRAL HERNIA REPAIR  06/23/13   Dr. Franz Dell    Social History   Socioeconomic History  . Marital status: Married    Spouse name: [name]  . Number of children: 1  . Years of education: 26  . Highest education level: GED or equivalent  Occupational History  . Occupation: Teaching laboratory technician    Comment: retired  Tobacco Use  . Smoking status: Former Smoker    Packs/day: 0.10    Years: 18.00    Pack years: 1.80    Types: Cigarettes    Quit date: 01/27/1979    Years since quitting: 41.0  . Smokeless tobacco: Never Used  Vaping Use  . Vaping Use: Never used  Substance and Sexual Activity  . Alcohol use: Not Currently  . Drug use: No  . Sexual activity: Not Currently    Partners: Male  Birth control/protection: Post-menopausal  Other Topics Concern  . Not on file  Social History Narrative   4 brothers and 6 sisters. All 4 brother deceased.  2 sisters deceased.  No regular exercise. Still working.     Social Determinants of Health   Financial Resource Strain:   . Difficulty of Paying Living Expenses:   Food Insecurity:   . Worried About Charity fundraiser in the Last Year:   . Arboriculturist in the Last Year:   Transportation Needs:   . Film/video editor (Medical):   [name] Lack of Transportation (Non-Medical):   Physical Activity:   . Days of Exercise per Week:   . Minutes of Exercise per Session:   Stress:   . Feeling of Stress :   Social Connections:   . Frequency of Communication with Friends and Family:   . Frequency of Social Gatherings with Friends and Family:   . Attends Religious Services:   .  Active Member of Clubs or Organizations:   . Attends Archivist Meetings:   [name] Marital Status:     Family History  Problem Relation Age of Onset  . Cancer Brother 27       melanoma  . Melanoma Brother   . Prostate cancer Brother   . Leukemia Brother   . Pulmonary fibrosis Brother   . Stroke Sister   . Hypertension Sister   . Arthritis Sister   . Lupus Sister   . Cancer Sister   . Breast cancer Sister        Age 27  . Rheum arthritis Sister   . Arthritis Daughter        Rheumatoid  . Hyperparathyroidism Daughter   . Heart attack Father 47  . Heart disease Father   . Anuerysm Father   . Hypertension Mother     Health Maintenance  Topic Date Due  . COVID-19 Vaccine (1) Never done  . TETANUS/TDAP  01/22/2020  . INFLUENZA VACCINE  01/25/2020  . MAMMOGRAM  02/23/2021  . COLONOSCOPY  02/09/2023  . DEXA SCAN  08/05/2024  . Hepatitis C Screening  Completed  . PNA vac Low Risk Adult  Completed     ----------------------------------------------------------------------------------------------------------------------------------------------------------------------------------------------------------------- Physical Exam BP (!) 119/59 (BP Location: Left Arm, Patient Position: Sitting, Cuff Size: Normal)   Pulse 77   Ht 5' 4.96" (1.65 m)   Wt 174 lb 8 oz (79.2 kg)   LMP 09/08/2000   SpO2 99%   BMI 29.07 kg/m   Physical Exam Constitutional:      Appearance: Normal appearance.  HENT:     Head: Normocephalic and atraumatic.  Skin:    Comments: A few erythematous patches with some light flaking/scaling along the scalp.  There is no swelling of drainage.    Neurological:     General: No focal deficit present.     Mental Status: She is alert.  Psychiatric:        Mood and Affect: Mood normal.        Behavior: Behavior normal.      ------------------------------------------------------------------------------------------------------------------------------------------------------------------------------------------------------------------- Assessment and Plan  Seborrheic dermatitis of scalp Seborrheic dermatitis vs eczema of the scalp.  Recent TSH looks ok.  Start clobetasol shampoo daily.  May use up to 4 weeks but I asked that she let us know if not improving within a couple of weeks.  She may use a small amount of OTC hydrocortisone cream on the outside of the ear canal as needed as well.  Meds ordered this encounter  Medications  . Clobetasol Propionate 0.05 % shampoo    Sig: Apply thin film to dry scalp once daily leave in place for 15 minutes, then add water, lather, and rinse thoroughly.  Use up to 4 weeks.    Dispense:  118 mL    Refill:  0    No follow-ups on file.    This visit occurred during the SARS-CoV-2 public health emergency.  Safety protocols were in place, including screening questions prior to the visit, additional usage of staff PPE, and extensive cleaning of exam room while observing appropriate contact time as indicated for disinfecting solutions.

## 2020-01-22 NOTE — Patient Instructions (Signed)
Seborrheic Dermatitis, Adult Seborrheic dermatitis is a skin disease that causes red, scaly patches. It usually occurs on the scalp, and it is often called dandruff. The patches may appear on other parts of the body. Skin patches tend to appear where there are many oil glands in the skin. Areas of the body that are commonly affected include:  Scalp.  Skin folds of the body.  Ears.  Eyebrows.  Neck.  Face.  Armpits.  The bearded area of men's faces. The condition may come and go for no known reason, and it is often long-lasting (chronic). What are the causes? The cause of this condition is not known. What increases the risk? This condition is more likely to develop in people who:  Have certain conditions, such as: ? HIV (human immunodeficiency virus). ? AIDS (acquired immunodeficiency syndrome). ? Parkinson disease. ? Mood disorders, such as depression.  Are 40-60 years old. What are the signs or symptoms? Symptoms of this condition include:  Thick scales on the scalp.  Redness on the face or in the armpits.  Skin that is flaky. The flakes may be white or yellow.  Skin that seems oily or dry but is not helped with moisturizers.  Itching or burning in the affected areas. How is this diagnosed? This condition is diagnosed with a medical history and physical exam. A sample of your skin may be tested (skin biopsy). You may need to see a skin specialist (dermatologist). How is this treated? There is no cure for this condition, but treatment can help to manage the symptoms. You may get treatment to remove scales, lower the risk of skin infection, and reduce swelling or itching. Treatment may include:  Creams that reduce swelling and irritation (steroids).  Creams that reduce skin yeast.  Medicated shampoo, soaps, moisturizing creams, or ointments.  Medicated moisturizing creams or ointments. Follow these instructions at home:  Apply over-the-counter and prescription  medicines only as told by your health care provider.  Use any medicated shampoo, soaps, skin creams, or ointments only as told by your health care provider.  Keep all follow-up visits as told by your health care provider. This is important. Contact a health care provider if:  Your symptoms do not improve with treatment.  Your symptoms get worse.  You have new symptoms. This information is not intended to replace advice given to you by your health care provider. Make sure you discuss any questions you have with your health care provider. Document Revised: 05/25/2017 Document Reviewed: 09/30/2015 Elsevier Patient Education  2020 Elsevier Inc.  

## 2020-01-22 NOTE — Assessment & Plan Note (Signed)
Seborrheic dermatitis vs eczema of the scalp.  Recent TSH looks ok.  Start clobetasol shampoo daily.  May use up to 4 weeks but I asked that she let us know if not improving within a couple of weeks.  She may use a small amount of OTC hydrocortisone cream on the outside of the ear canal as needed as well.

## 2020-02-24 NOTE — Progress Notes (Signed)
Office Visit Note  Patient: Anne [name]             Date of Birth: Jan 23, 1951           MRN: [id number]             PCP: [name], MD Referring: [name], * Visit Date: 03/09/2020 Occupation: @GUAROCC @  Subjective:  Joint Pain (right middle finger, left ring finger)   History of Present Illness: Adwoa Axe is a 69 y.o. female with history of osteoarthritis and degenerative disc disease.  She states she continues to have pain and stiffness in her hands and her knee joints.  She is complaining about right middle finger and left ring finger triggering.  She states the pain is not bad enough to do anything at this point.  She continues to have some lower back discomfort which is tolerable.  She denies any joint swelling.  Activities of Daily Living:  Patient reports morning stiffness for 45 minutes.   Patient Denies nocturnal pain.  Difficulty dressing/grooming: Denies Difficulty climbing stairs: Reports Difficulty getting out of chair: Denies Difficulty using hands for taps, buttons, cutlery, and/or writing: Denies  Review of Systems  Constitutional: Negative for fatigue.  HENT: Negative for mouth sores, mouth dryness and nose dryness.   Eyes: Negative for pain, visual disturbance and dryness.  Respiratory: Negative for shortness of breath and difficulty breathing.   Cardiovascular: Negative for chest pain and swelling in legs/feet.  Gastrointestinal: Negative for constipation and diarrhea.  Endocrine: Negative for increased urination.  Genitourinary: Negative for difficulty urinating.  Musculoskeletal: Positive for arthralgias, joint pain and morning stiffness. Negative for myalgias, muscle weakness, muscle tenderness and myalgias.  Skin: Negative for color change, rash and redness.  Allergic/Immunologic: Negative for susceptible to infections.  Neurological: Negative for dizziness and headaches.  Hematological: Positive for  bruising/bleeding tendency.  Psychiatric/Behavioral: Negative for confusion and sleep disturbance.    PMFS History:  Patient Active Problem List   Diagnosis Date Noted   Seborrheic dermatitis of scalp 01/22/2020   Cervical radiculopathy 08/25/2019   History of esophagogastroduodenoscopy (EGD) 02/08/2018   Colon cancer screening 02/08/2018   Essential hypertension 12/25/2017   GERD (gastroesophageal reflux disease) 08/30/2017   Chronic diastolic (congestive) heart failure ([place]) 08/30/2017   Paroxysmal atrial fibrillation ([place]) 08/30/2017   Flushing 07/30/2017   Paresthesia of both hands 07/30/2017   Postsurgical hypothyroidism 07/30/2017   Aortic atherosclerosis ([place]) 04/28/2013   S/P cataract extraction 01/16/2013   Pseudophakia of both eyes 12/20/2012   Schatzki's ring 10/16/2012   Spinal stenosis of lumbar region 08/16/2012   Vaginal atrophy 11/14/2011   Graves' disease 05/05/2011   Disc herniation    Osteopenia    PERSONAL HISTORY OF ALLERGY TO LATEX 12/23/2009   UNSPECIFIED DISEASE OF PHARYNX 12/16/2008   FATIGUE 11/10/2008   Intermittent palpitations 11/10/2008   Hypothyroidism 09/22/2008   Hyperlipidemia 09/22/2008   ALLERGIC RHINITIS 09/22/2008    Past Medical History:  Diagnosis Date   Acid reflux    Aortic atherosclerosis ([place]) 04/28/2013   CT 08/2017   Arthritis    "joints, hands" (08/30/2017)   Chronic lower back pain    Disc herniation    neck and low back   Family history of adverse reaction to anesthesia    "daughter w/PONV"   Follicular cystitis    Hiatal hernia    High cholesterol    Hyperlipidemia    ELEV CHOLESTEROL   Hypothyroidism    Osteopenia 2014  T score -1.6 FRAX 13%/1%   PAF (paroxysmal atrial fibrillation) ([place]) 08/30/2017   Echo 2/19: EF 55-60, normal wall motion, grade 1 diastolic dysfunction, mild PI, trivial TR //  Nuc 3/19: no ischemia or scar, EF 97 // CHADS2-VASc: 3 // Eliquis 5 bid     Schatzki's ring     Family History  Problem Relation Age of Onset   Cancer Brother 37       melanoma   Melanoma Brother    Prostate cancer Brother    Leukemia Brother    Pulmonary fibrosis Brother    Stroke Sister    Hypertension Sister    Arthritis Sister    Lupus Sister    Cancer Sister    Breast cancer Sister        Age 65   Rheum arthritis Sister    Arthritis Daughter        Rheumatoid   Hyperparathyroidism Daughter    Heart attack Father 14   Heart disease Father    Anuerysm Father    Hypertension Mother    Past Surgical History:  Procedure Laterality Date   BREAST BIOPSY Bilateral 1997   "excisional bx both benign"   BREAST CYST ASPIRATION Right ~ [place] IMPLANT Left 2011   CATARACT EXTRACTION W/ INTRAOCULAR LENS IMPLANT Right 12/20/2012   Dr. Haynes Bast, OD   COLONOSCOPY WITH ESOPHAGOGASTRODUODENOSCOPY (EGD)  "several"   "? dilated esophagus" (08/30/2017)   DILATION AND CURETTAGE OF UTERUS  1996   EYE MUSCLE SURGERY Bilateral 1992   HERNIA REPAIR     HYSTEROSCOPY     LAPAROSCOPIC CHOLECYSTECTOMY  09/28/2011   OOPHORECTOMY Right 04/2000   LAP RSO/LYSIS OF ADHESIONS   THYROIDECTOMY  1976   TUBAL LIGATION  1976   VARICOSE VEIN SURGERY Bilateral 1972   VENTRAL HERNIA REPAIR  06/23/13   Dr. Franz Dell   Social History   Social History Narrative   4 brothers and 6 sisters. All 4 brother deceased.  2 sisters deceased.  No regular exercise. Still working.     Immunization History  Administered Date(s) Administered   Influenza Split 06/02/2011, 05/03/2012   Influenza Whole 04/16/2009   Influenza,inj,Quad PF,6+ Mos 05/01/2013, 04/17/2014   Pneumococcal Conjugate-13 10/17/2013   Pneumococcal Polysaccharide-23 02/04/2016   Td 01/21/2010   Zoster 08/18/2011     Objective: Vital Signs: BP 120/73 (BP Location: Left Arm, Patient Position: Sitting, Cuff Size: Small)    Pulse 76     Resp 12    Ht 5\' 5"  (1.651 m)    Wt 172 lb (78 kg)    LMP 09/08/2000    BMI 28.62 kg/m    Physical Exam Vitals and nursing note reviewed.  Constitutional:      Appearance: She is well-developed.  HENT:     Head: Normocephalic and atraumatic.  Eyes:     Conjunctiva/sclera: Conjunctivae normal.  Cardiovascular:     Rate and Rhythm: Normal rate and regular rhythm.     Heart sounds: Normal heart sounds.  Pulmonary:     Effort: Pulmonary effort is normal.     Breath sounds: Normal breath sounds.  Abdominal:     General: Bowel sounds are normal.     Palpations: Abdomen is soft.  Musculoskeletal:     Cervical back: Normal range of motion.  Lymphadenopathy:     Cervical: No cervical adenopathy.  Skin:    General: Skin is warm and dry.     Capillary Refill:  Capillary refill takes less than 2 seconds.  Neurological:     Mental Status: She is alert and oriented to person, place, and time.  Psychiatric:        Behavior: Behavior normal.      Musculoskeletal Exam: C-spine thoracic and lumbar spine with good range of motion with no discomfort.  Shoulder joints, elbow joints, wrist joints with good range of motion.  She has DIP and PIP thickening.  Right middle trigger finger and left ring trigger finger.  Hip joints, knee joints in good range of motion.  No tenderness was noted over ankles or MTPs.  No synovitis noted on my examination.  CDAI Exam: CDAI Score: -- Patient Global: --; Provider Global: -- Swollen: --; Tender: -- Joint Exam 03/09/2020   No joint exam has been documented for this visit   There is currently no information documented on the homunculus. Go to the Rheumatology activity and complete the homunculus joint exam.  Investigation: No additional findings.  Imaging: MM 3D SCREEN BREAST BILATERAL  Result Date: 02/27/2020 CLINICAL DATA:  Screening. EXAM: DIGITAL SCREENING BILATERAL MAMMOGRAM WITH TOMO AND CAD COMPARISON:  Previous exam(s). ACR Breast Density  Category c: The breast tissue is heterogeneously dense, which may obscure small masses. FINDINGS: There are no findings suspicious for malignancy. Images were processed with CAD. IMPRESSION: No mammographic evidence of malignancy. A result letter of this screening mammogram will be mailed directly to the patient. RECOMMENDATION: Screening mammogram in one year. (Code:SM-B-01Y) BI-RADS CATEGORY  1: Negative. Electronically Signed   By: Lillia Mountain M.D.   On: 02/27/2020 10:28    Recent Labs: Lab Results  Component Value Date   WBC 6.0 06/03/2019   HGB 13.8 06/03/2019   PLT 192 06/03/2019   NA 138 08/25/2019   K 4.5 08/25/2019   CL 105 08/25/2019   CO2 28 08/25/2019   GLUCOSE 119 (H) 08/25/2019   BUN 12 08/25/2019   CREATININE 0.90 08/25/2019   BILITOT 0.7 08/25/2019   ALKPHOS 81 08/30/2017   AST 18 08/25/2019   ALT 17 08/25/2019   PROT 6.8 08/25/2019   ALBUMIN 4.2 08/30/2017   CALCIUM 9.4 08/25/2019   GFRAA 75 08/25/2019    Speciality Comments: No specialty comments available.  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  She is supposed to notify me if she develops any increased joint pain or swelling.  Primary osteoarthritis of both hands-she has PIP and DIP thickening with no synovitis.  Joint protection was discussed.  A handout on hand exercises was given.  Primary osteoarthritis of both knees - Bilateral mild osteoarthritis and moderate chondromalacia patella: Muscle strengthening was discussed.  Trigger finger, left ring finger-I offered cortisone injection but she declined.  Use of topical Voltaren gel was discussed.  Trigger finger, right middle  finger-she does not want a cortisone injection at this point.  She will contact us when she is ready.  DDD (degenerative disc disease), cervical-the pain is currently manageable.  Spinal stenosis of lumbar region, unspecified whether neurogenic claudication present  Essential hypertension  Chronic diastolic (congestive) heart failure (HCC)  History of gastroesophageal reflux (GERD)  Postsurgical hypothyroidism  Paroxysmal atrial fibrillation (H[facility])  Aortic atherosclerosis ([place])  History of hyperlipidemia  Family history of rheumatoid arthritis  Former smoker  Osteopenia of multiple sites - February 10, 2021The BMD measured at Forearm Radius 33% is 0.675 g/cm2 with a T-scoreof -2.3.  I reviewed her bone density.  Use of calcium vitamin D and resistive exercises were discussed.  Educated about COVID-19 virus infection-patient does not want to get COVID-19 vaccine.  The risk of not getting vaccines were discussed at length.  Use of mask, social distancing and hand hygiene was emphasized.  Also discussed that she may qualify for use of Covid 19 antibodies infusion if she develops COVID-19 infection.  Orders: No orders of the defined types were placed in this encounter.  No orders of the defined types were placed in this encounter.   Follow-Up Instructions: Return in about 1 year (around 03/09/2021) for Osteoarthritis, +CCP,TF.   Bo Merino, MD  Note - This record has been created using Editor, commissioning.  Chart creation errors have been sought, but may not always  have been located. Such creation errors do not reflect on  the standard of medical care.

## 2020-02-25 ENCOUNTER — Other Ambulatory Visit: Payer: Self-pay

## 2020-02-25 ENCOUNTER — Ambulatory Visit
Admission: RE | Admit: 2020-02-25 | Discharge: 2020-02-25 | Disposition: A | Discharge status: Home / Self Care | Payer: Medicare Other | Source: Ambulatory Visit | Attending: Family Medicine | Admitting: Family Medicine

## 2020-02-25 DIAGNOSIS — Z1231 Encounter for screening mammogram for malignant neoplasm of breast: Secondary | ICD-10-CM

## 2020-03-08 ENCOUNTER — Telehealth: Payer: Self-pay | Admitting: Family Medicine

## 2020-03-08 ENCOUNTER — Other Ambulatory Visit: Payer: Self-pay | Admitting: Family Medicine

## 2020-03-08 NOTE — Telephone Encounter (Signed)
Call pt: please let her know she can't take melodic and Eliquis. I have removed meloxicam from her med list.  She cannot take IBU ro Aleve with her Eliquis as well.

## 2020-03-09 ENCOUNTER — Other Ambulatory Visit: Payer: Self-pay

## 2020-03-09 ENCOUNTER — Encounter: Payer: Self-pay | Admitting: Rheumatology

## 2020-03-09 ENCOUNTER — Ambulatory Visit (INDEPENDENT_AMBULATORY_CARE_PROVIDER_SITE_OTHER): Payer: Medicare Other | Admitting: Rheumatology

## 2020-03-09 VITALS — BP 120/73 | HR 76 | Resp 12 | Ht 65.0 in | Wt 172.0 lb

## 2020-03-09 DIAGNOSIS — Z8261 Family history of arthritis: Secondary | ICD-10-CM

## 2020-03-09 DIAGNOSIS — M65331 Trigger finger, right middle finger: Secondary | ICD-10-CM

## 2020-03-09 DIAGNOSIS — I5032 Chronic diastolic (congestive) heart failure: Secondary | ICD-10-CM

## 2020-03-09 DIAGNOSIS — I10 Essential (primary) hypertension: Secondary | ICD-10-CM

## 2020-03-09 DIAGNOSIS — M5030 Other cervical disc degeneration, unspecified cervical region: Secondary | ICD-10-CM | POA: Diagnosis not present

## 2020-03-09 DIAGNOSIS — Z8639 Personal history of other endocrine, nutritional and metabolic disease: Secondary | ICD-10-CM

## 2020-03-09 DIAGNOSIS — E890 Postprocedural hypothyroidism: Secondary | ICD-10-CM | POA: Diagnosis not present

## 2020-03-09 DIAGNOSIS — M19042 Primary osteoarthritis, left hand: Secondary | ICD-10-CM

## 2020-03-09 DIAGNOSIS — M48061 Spinal stenosis, lumbar region without neurogenic claudication: Secondary | ICD-10-CM | POA: Diagnosis not present

## 2020-03-09 DIAGNOSIS — M8589 Other specified disorders of bone density and structure, multiple sites: Secondary | ICD-10-CM

## 2020-03-09 DIAGNOSIS — M65342 Trigger finger, left ring finger: Secondary | ICD-10-CM

## 2020-03-09 DIAGNOSIS — Z8719 Personal history of other diseases of the digestive system: Secondary | ICD-10-CM | POA: Diagnosis not present

## 2020-03-09 DIAGNOSIS — M170 Bilateral primary osteoarthritis of knee: Secondary | ICD-10-CM

## 2020-03-09 DIAGNOSIS — I700 Atherosclerosis of aorta: Secondary | ICD-10-CM

## 2020-03-09 DIAGNOSIS — R768 Other specified abnormal immunological findings in serum: Secondary | ICD-10-CM | POA: Diagnosis not present

## 2020-03-09 DIAGNOSIS — M19041 Primary osteoarthritis, right hand: Secondary | ICD-10-CM

## 2020-03-09 DIAGNOSIS — Z87891 Personal history of nicotine dependence: Secondary | ICD-10-CM

## 2020-03-09 DIAGNOSIS — Z7189 Other specified counseling: Secondary | ICD-10-CM

## 2020-03-09 DIAGNOSIS — I480 Paroxysmal atrial fibrillation: Secondary | ICD-10-CM

## 2020-03-09 NOTE — Patient Instructions (Signed)
Hand Exercises Hand exercises can be helpful for almost anyone. These exercises can strengthen the hands, improve flexibility and movement, and increase blood flow to the hands. These results can make work and daily tasks easier. Hand exercises can be especially helpful for people who have joint pain from arthritis or have nerve damage from overuse (carpal tunnel syndrome). These exercises can also help people who have injured a hand. Exercises Most of these hand exercises are gentle stretching and motion exercises. It is usually safe to do them often throughout the day. Warming up your hands before exercise may help to reduce stiffness. You can do this with gentle massage or by placing your hands in warm water for 10-15 minutes. It is normal to feel some stretching, pulling, tightness, or mild discomfort as you begin new exercises. This will gradually improve. Stop an exercise right away if you feel sudden, severe pain or your pain gets worse. Ask your health care provider which exercises are best for you. Knuckle bend or "claw" fist 1. Stand or sit with your arm, hand, and all five fingers pointed straight up. Make sure to keep your wrist straight during the exercise. 2. Gently bend your fingers down toward your palm until the tips of your fingers are touching the top of your palm. Keep your big knuckle straight and just bend the small knuckles in your fingers. 3. Hold this position for __________ seconds. 4. Straighten (extend) your fingers back to the starting position. Repeat this exercise 5-10 times with each hand. Full finger fist 1. Stand or sit with your arm, hand, and all five fingers pointed straight up. Make sure to keep your wrist straight during the exercise. 2. Gently bend your fingers into your palm until the tips of your fingers are touching the middle of your palm. 3. Hold this position for __________ seconds. 4. Extend your fingers back to the starting position, stretching every  joint fully. Repeat this exercise 5-10 times with each hand. Straight fist 1. Stand or sit with your arm, hand, and all five fingers pointed straight up. Make sure to keep your wrist straight during the exercise. 2. Gently bend your fingers at the big knuckle, where your fingers meet your hand, and the middle knuckle. Keep the knuckle at the tips of your fingers straight and try to touch the bottom of your palm. 3. Hold this position for __________ seconds. 4. Extend your fingers back to the starting position, stretching every joint fully. Repeat this exercise 5-10 times with each hand. Tabletop 1. Stand or sit with your arm, hand, and all five fingers pointed straight up. Make sure to keep your wrist straight during the exercise. 2. Gently bend your fingers at the big knuckle, where your fingers meet your hand, as far down as you can while keeping the small knuckles in your fingers straight. Think of forming a tabletop with your fingers. 3. Hold this position for __________ seconds. 4. Extend your fingers back to the starting position, stretching every joint fully. Repeat this exercise 5-10 times with each hand. Finger spread 1. Place your hand flat on a table with your palm facing down. Make sure your wrist stays straight as you do this exercise. 2. Spread your fingers and thumb apart from each other as far as you can until you feel a gentle stretch. Hold this position for __________ seconds. 3. Bring your fingers and thumb tight together again. Hold this position for __________ seconds. Repeat this exercise 5-10 times with each hand.   Making circles 1. Stand or sit with your arm, hand, and all five fingers pointed straight up. Make sure to keep your wrist straight during the exercise. 2. Make a circle by touching the tip of your thumb to the tip of your index finger. 3. Hold for __________ seconds. Then open your hand wide. 4. Repeat this motion with your thumb and each finger on your  hand. Repeat this exercise 5-10 times with each hand. Thumb motion 1. Sit with your forearm resting on a table and your wrist straight. Your thumb should be facing up toward the ceiling. Keep your fingers relaxed as you move your thumb. 2. Lift your thumb up as high as you can toward the ceiling. Hold for __________ seconds. 3. Bend your thumb across your palm as far as you can, reaching the tip of your thumb for the small finger (pinkie) side of your palm. Hold for __________ seconds. Repeat this exercise 5-10 times with each hand. Grip strengthening  1. Hold a stress ball or other soft ball in the middle of your hand. 2. Slowly increase the pressure, squeezing the ball as much as you can without causing pain. Think of bringing the tips of your fingers into the middle of your palm. All of your finger joints should bend when doing this exercise. 3. Hold your squeeze for __________ seconds, then relax. Repeat this exercise 5-10 times with each hand. Contact a health care provider if:  Your hand pain or discomfort gets much worse when you do an exercise.  Your hand pain or discomfort does not improve within 2 hours after you exercise. If you have any of these problems, stop doing these exercises right away. Do not do them again unless your health care provider says that you can. Get help right away if:  You develop sudden, severe hand pain or swelling. If this happens, stop doing these exercises right away. Do not do them again unless your health care provider says that you can. This information is not intended to replace advice given to you by your health care provider. Make sure you discuss any questions you have with your health care provider. Document Revised: 10/03/2018 Document Reviewed: 06/13/2018 Elsevier Patient Education  2020 Elsevier Inc.  

## 2020-03-10 MED ORDER — MELOXICAM 7.5 MG PO TABS: 7.5000 mg | ORAL_TABLET | Freq: Every day | ORAL | 1 refills | Status: DC

## 2020-03-10 NOTE — Telephone Encounter (Signed)
Okay, I had forgotten about this.  Medication refill.  Just remember to try to use sparingly.

## 2020-03-10 NOTE — Telephone Encounter (Signed)
Called and advised pt of recommendations.  She stated that Dr. Cathie Olden had said that it would be ok for her to take this. She stated that he said she can take 7.5 mg. This was approved by him over 1 yr ago and she stated that he spoke with Dr. Madilyn Fireman about this.   I looked back in her records and found the following phone note:  Anne Marry, MD     8:48 AM Note No problem.  Thank you!  She will be excited to hear this!  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      September 18, 2018 [name], Anne Cheng, MD to Anne Marry, MD     4:12 PM Note I spoke to [name] pharmacist has reviewed It will be OK for [name] to take a low dose Meloxicam ( 7.5 mg a day )  and try to take it intermittantly along with her eliquis. She will watch for HTN and any bleeding   Will ask Anne Marry, MD to call in a script for meloxicam 15 mg a day and [name] will take 1/2 tablet a day  ( [facility] says that the 15 mg tabs are $33 per month but the 7.5 mg tabs are $395 per months)

## 2020-03-11 NOTE — Telephone Encounter (Signed)
Left a detailed vm msg for patient regarding meloxicam rx refill. Pt aware of provider's recommendation while taking the rx. Direct call back info provided.

## 2020-03-22 IMAGING — DX DG RIBS W/ CHEST 3+V*L*
3 series · 3 of 3 positions shown · non-contrast
Comparison: CT chest and chest x-ray dated August 30, 2017.

CLINICAL DATA: Anterior mid to left lateral chest pain for the past
week after being hit by an elevator door.

EXAM:
LEFT RIBS AND CHEST - 3+ VIEW

[chest pa]
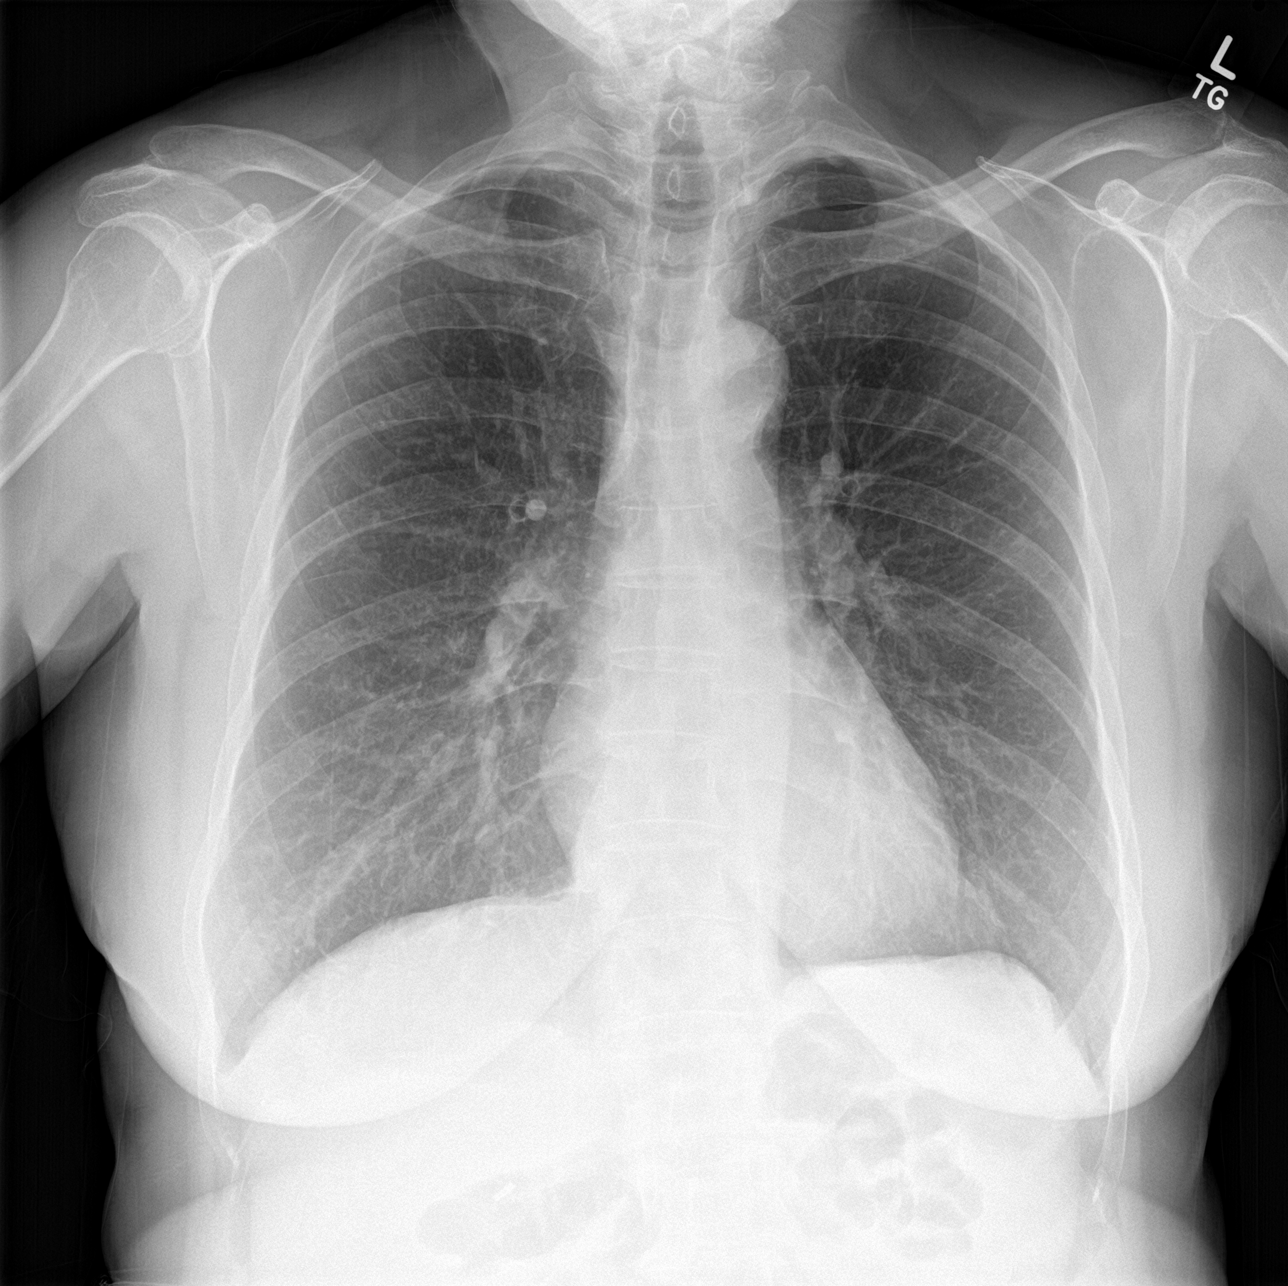

[rib pa (1 of 2)]
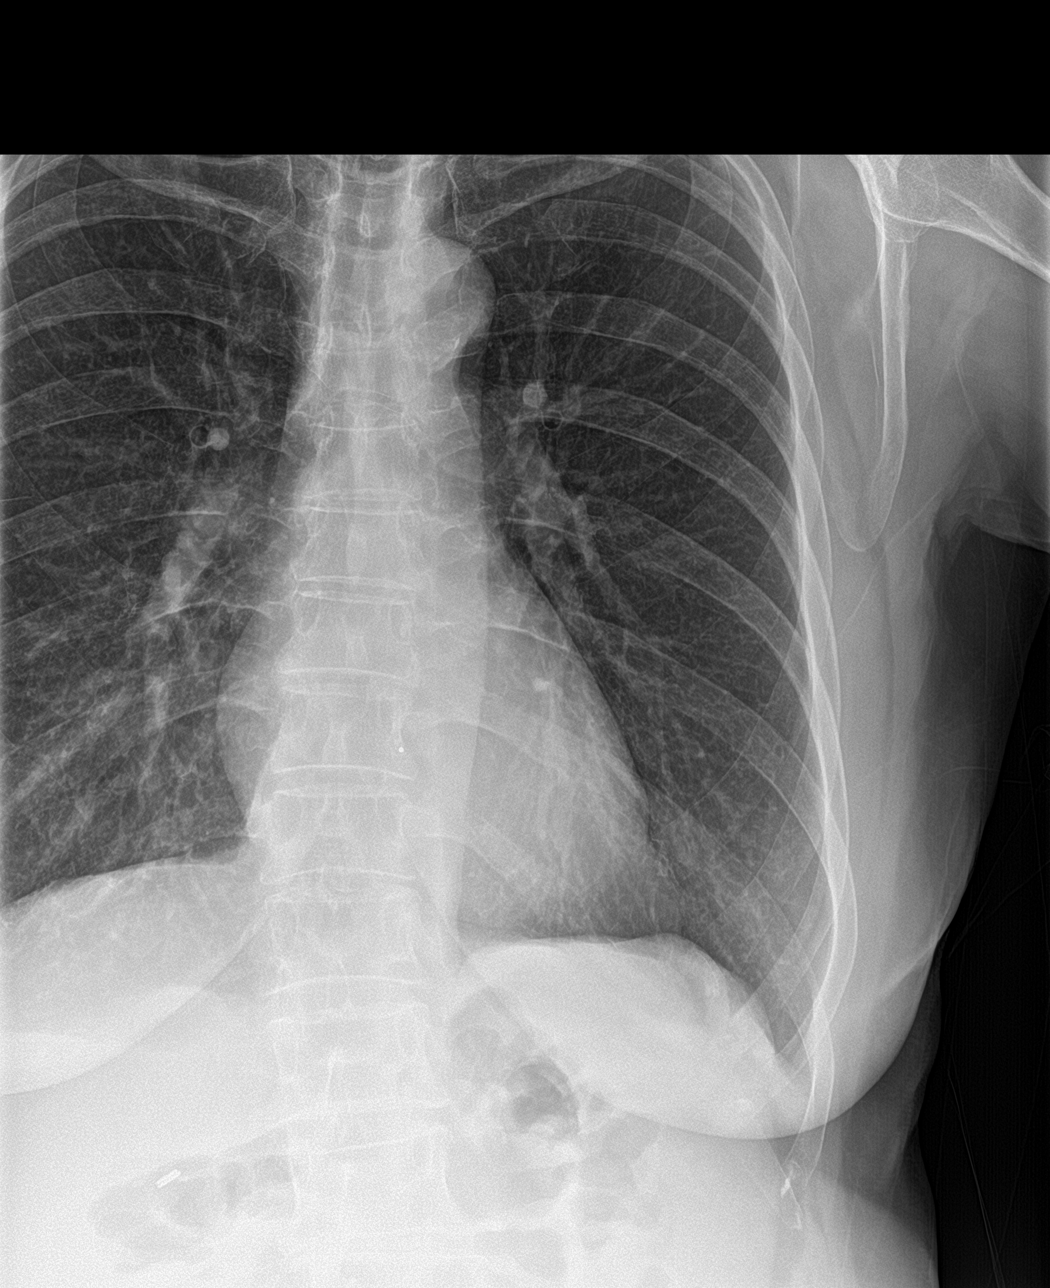

[rib pa (2 of 2)]
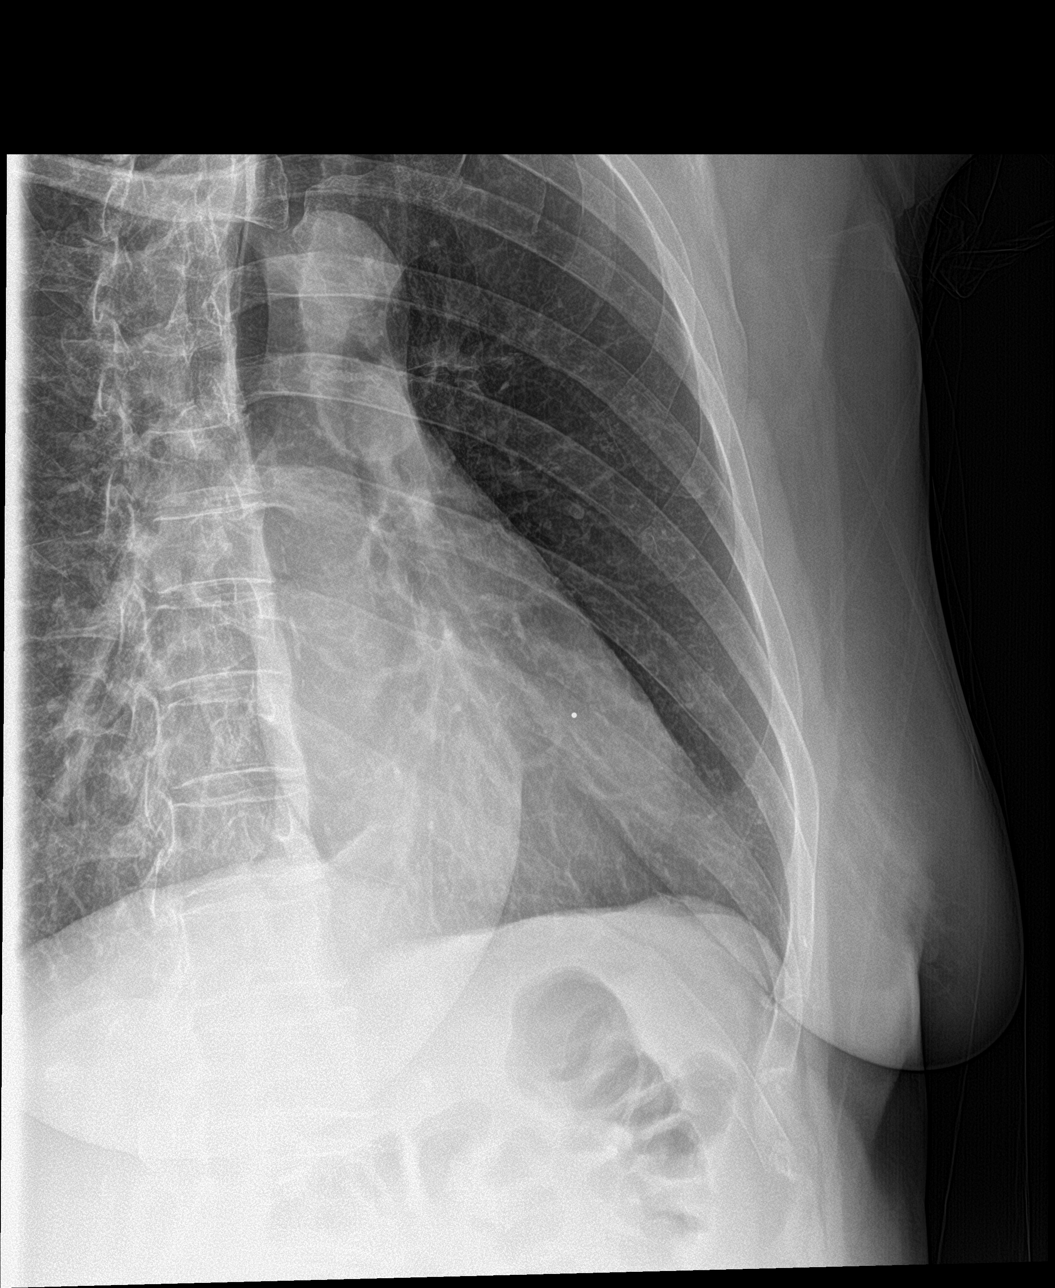

[3 of 3 positions shown; findings below may reference images not displayed]

FINDINGS: No fracture or other bone lesions are seen involving the ribs. There
is no evidence of pneumothorax or pleural effusion. Both lungs are
clear. Heart size and mediastinal contours are within normal limits.
IMPRESSION: Negative.

## 2020-03-29 ENCOUNTER — Encounter: Payer: Self-pay | Admitting: Family Medicine

## 2020-03-29 ENCOUNTER — Other Ambulatory Visit: Payer: Self-pay

## 2020-03-29 ENCOUNTER — Other Ambulatory Visit: Payer: Self-pay | Admitting: Cardiovascular Disease

## 2020-03-29 ENCOUNTER — Ambulatory Visit (INDEPENDENT_AMBULATORY_CARE_PROVIDER_SITE_OTHER): Payer: Medicare Other | Admitting: Family Medicine

## 2020-03-29 VITALS — BP 109/60 | HR 65 | Ht 65.0 in | Wt 173.0 lb

## 2020-03-29 DIAGNOSIS — L219 Seborrheic dermatitis, unspecified: Secondary | ICD-10-CM | POA: Diagnosis not present

## 2020-03-29 DIAGNOSIS — R682 Dry mouth, unspecified: Secondary | ICD-10-CM | POA: Diagnosis not present

## 2020-03-29 DIAGNOSIS — E890 Postprocedural hypothyroidism: Secondary | ICD-10-CM

## 2020-03-29 DIAGNOSIS — I10 Essential (primary) hypertension: Secondary | ICD-10-CM

## 2020-03-29 DIAGNOSIS — E039 Hypothyroidism, unspecified: Secondary | ICD-10-CM

## 2020-03-29 NOTE — Assessment & Plan Note (Signed)
Due to recheck TSH and make any adjustments needed.  Continue current regimen.

## 2020-03-29 NOTE — Assessment & Plan Note (Signed)
Unclear etiology at this point it seems really to affect her mostly at night which means it is less likely to be Sjogren's though she does interestingly have a positive anti-CCP so we will also check for Sjogren's on blood work.  Also recommend referral to ENT for further work-up I am not sure if she could have some nasal issues or blockage that might be causing her to mouth breathe more at night.  Fortunately she is negative for sleep apnea and has even tried allergy covers on her pillows.

## 2020-03-29 NOTE — Assessment & Plan Note (Signed)
Well controlled. Continue current regimen. Follow up in  6 mo  

## 2020-03-29 NOTE — Telephone Encounter (Signed)
Prescription refill request for Eliquis received.  Last office visit: N[place], 06/03/2019 Scr: 0.90, 08/26/2019 Age: 69 y.o. Weight: 78 kg   Prescription refill sent.

## 2020-03-29 NOTE — Progress Notes (Signed)
Established Patient Office Visit  Subjective:  Patient ID: Anne [name], female    DOB: [birth year]  Age: 69 y.o. MRN: [id number]  CC:  Chief Complaint  Patient presents with  . Hypothyroidism    HPI [name] presents for   Hypothyroidism - Taking medication regularly in the AM away from food and vitamins, etc. No recent change  hair, or energy levels.  He has noticed that her skin seems much more thin and paperlike and sometimes rough.  She says she has noticed it more since she has been on the Eliquis.  More so than being a thyroid issue.  She is particularly concerned about dry mouth that she experiences at night.  She says it gets extremely dry she tends to mouth breathe at night in fact we did do a sleep study in June 2019 which showed no significant obstructive sleep apnea though she did have loud snoring with open mouth breathing.  She says by the time she wakes up in the morning everything will just drain and all the moisture with her mouth.  But yet it takes most of the day for her to actually feel better moisturized and feel like she is getting some relief.  She says it so uncomfortable that at times that the mucous membranes will actually burn.  The tongue itself does not burn.  She has tried putting allergy covers on her pillowcases to see if this would help.  Hypertension- Pt denies chest pain, SOB, dizziness, or heart palpitations.  Taking meds as directed w/o problems.  Denies medication side effects.       Past Medical History:  Diagnosis Date  . Acid reflux   . Aortic atherosclerosis ([place]) 04/28/2013   CT 08/2017  . Arthritis    "joints, hands" (08/30/2017)  . Chronic lower back pain   . Disc herniation    neck and low back  . Family history of adverse reaction to anesthesia    "daughter w/PONV"  . Follicular cystitis   . Hiatal hernia   . High cholesterol   . Hyperlipidemia    ELEV CHOLESTEROL  . Hypothyroidism   . Osteopenia 2014   T  score -1.6 FRAX 13%/1%  . PAF (paroxysmal atrial fibrillation) ([place]) 08/30/2017   Echo 2/19: EF 55-60, normal wall motion, grade 1 diastolic dysfunction, mild PI, trivial TR //  Nuc 3/19: no ischemia or scar, EF 80 // CHADS2-VASc: 3 // Eliquis 5 bid   . Schatzki's ring     Past Surgical History:  Procedure Laterality Date  . BREAST BIOPSY Bilateral 1997   "excisional bx both benign"  . BREAST CYST ASPIRATION Right ~ 1993  . CATARACT EXTRACTION W/ INTRAOCULAR LENS IMPLANT Left 2011  . CATARACT EXTRACTION W/ INTRAOCULAR LENS IMPLANT Right 12/20/2012   Dr. Haynes Bast, OD  . COLONOSCOPY WITH ESOPHAGOGASTRODUODENOSCOPY (EGD)  "several"   "? dilated esophagus" (08/30/2017)  . DILATION AND CURETTAGE OF UTERUS  1996  . EYE MUSCLE SURGERY Bilateral 1992  . HERNIA REPAIR    . HYSTEROSCOPY    . LAPAROSCOPIC CHOLECYSTECTOMY  09/28/2011  . OOPHORECTOMY Right 04/2000   LAP RSO/LYSIS OF ADHESIONS  . THYROIDECTOMY  1976  . TUBAL LIGATION  1976  . VARICOSE VEIN SURGERY Bilateral 1972  . VENTRAL HERNIA REPAIR  06/23/13   Dr. Franz Dell    Family History  Problem Relation Age of Onset  . Cancer Brother 59       melanoma  . Melanoma Brother   .  Prostate cancer Brother   . Leukemia Brother   . Pulmonary fibrosis Brother   . Stroke Sister   . Hypertension Sister   . Arthritis Sister   . Lupus Sister   . Cancer Sister   . Breast cancer Sister        Age 75  . Rheum arthritis Sister   . Arthritis Daughter        Rheumatoid  . Hyperparathyroidism Daughter   . Heart attack Father 53  . Heart disease Father   . Anuerysm Father   . Hypertension Mother     Social History   Socioeconomic History  . Marital status: Married    Spouse name: [name]  . Number of children: 1  . Years of education: 54  . Highest education level: GED or equivalent  Occupational History  . Occupation: Teaching laboratory technician    Comment: retired  Tobacco Use  . Smoking status: Former Smoker    Packs/day: 0.10     Years: 18.00    Pack years: 1.80    Types: Cigarettes    Quit date: 01/27/1979    Years since quitting: 41.1  . Smokeless tobacco: Never Used  Vaping Use  . Vaping Use: Never used  Substance and Sexual Activity  . Alcohol use: Not Currently  . Drug use: No  . Sexual activity: Not Currently    Partners: Male    Birth control/protection: Post-menopausal  Other Topics Concern  . Not on file  Social History Narrative   4 brothers and 6 sisters. All 4 brother deceased.  2 sisters deceased.  No regular exercise. Still working.     Social Determinants of Health   Financial Resource Strain:   . Difficulty of Paying Living Expenses: Not on file  Food Insecurity:   . Worried About Charity fundraiser in the Last Year: Not on file  . Ran Out of Food in the Last Year: Not on file  Transportation Needs:   . Lack of Transportation (Medical): Not on file  . Lack of Transportation (Non-Medical): Not on file  Physical Activity:   . Days of Exercise per Week: Not on file  . Minutes of Exercise per Session: Not on file  Stress:   . Feeling of Stress : Not on file  Social Connections:   . Frequency of Communication with Friends and Family: Not on file  . Frequency of Social Gatherings with Friends and Family: Not on file  . Attends Religious Services: Not on file  . Active Member of Clubs or Organizations: Not on file  . Attends Archivist Meetings: Not on file  . Marital Status: Not on file  Intimate Partner Violence:   . Fear of Current or Ex-Partner: Not on file  . Emotionally Abused: Not on file  . Physically Abused: Not on file  . Sexually Abused: Not on file    Outpatient Medications Prior to Visit  Medication Sig Dispense Refill  . CALCIUM PO Take 1 tablet by mouth daily.     . Cholecalciferol (VITAMIN D) 2000 UNITS tablet Take 2,000 Units by mouth daily.      [name] ELIQUIS 5 MG TABS tablet TAKE 1 TABLET BY MOUTH TWICE A DAY 180 tablet 1  . estradiol (ESTRACE) 0.1 MG/GM  vaginal cream Place 9.03 Applicatorfuls vaginally as needed. For vaginal irritation 42.5 g 2  . meloxicam (MOBIC) 7.5 MG tablet Take 1 tablet (7.5 mg total) by mouth daily. 90 tablet 1  . metoprolol succinate (TOPROL-XL)  50 MG 24 hr tablet TAKE 1 AND 1/2 TABS DAILY TO = 75 MG DAILY 135 tablet 3  . Multiple Vitamin (MULTIVITAMIN) tablet Take 1 tablet by mouth daily.      . Multiple Vitamins-Minerals (ZINC PO) Take 1 tablet by mouth daily.    [name] omeprazole (PRILOSEC) 40 MG capsule TAKE 1 CAPSULE (40 MG TOTAL) BY MOUTH DAILY. 90 capsule 1  . simvastatin (ZOCOR) 40 MG tablet TAKE 1 TABLET BY MOUTH EVERY DAY 90 tablet 3  . SYNTHROID 75 MCG tablet TAKE 1 TABLET (75 MCG TOTAL) BY MOUTH DAILY BEFORE BREAKFAST. 90 tablet 2   No facility-administered medications prior to visit.    Allergies  Allergen Reactions  . Amoxicillin-Pot Clavulanate     REACTION: blisters in colon (Augmentin)  . Ceftriaxone Sodium     REACTION: throat swells (ROCEPHIN)  . Cephalexin   . Codeine     REACTION: nausea  . Molds & Smuts   . Zofran [Ondansetron Hcl]     Headaches    ROS Review of Systems    Objective:    Physical Exam Constitutional:      Appearance: She is well-developed.  HENT:     Head: Normocephalic and atraumatic.  Cardiovascular:     Rate and Rhythm: Normal rate and regular rhythm.     Heart sounds: Normal heart sounds.  Pulmonary:     Effort: Pulmonary effort is normal.     Breath sounds: Normal breath sounds.  Skin:    General: Skin is warm and dry.  Neurological:     Mental Status: She is alert and oriented to person, place, and time.  Psychiatric:        Behavior: Behavior normal.     BP 109/60   Pulse 65   Ht 5\' 5"  (1.651 m)   Wt 173 lb (78.5 kg)   LMP 09/08/2000   SpO2 98%   BMI 28.79 kg/m  Wt Readings from Last 3 Encounters:  03/29/20 173 lb (78.5 kg)  03/09/20 172 lb (78 kg)  01/22/20 174 lb 8 oz (79.2 kg)     There are no preventive care reminders to display  for this patient.  There are no preventive care reminders to display for this patient.  Lab Results  Component Value Date   TSH 0.85 12/09/2019   Lab Results  Component Value Date   WBC 6.0 06/03/2019   HGB 13.8 06/03/2019   HCT 41.6 06/03/2019   MCV 87 06/03/2019   PLT 192 06/03/2019   Lab Results  Component Value Date   NA 138 08/25/2019   K 4.5 08/25/2019   CO2 28 08/25/2019   GLUCOSE 119 (H) 08/25/2019   BUN 12 08/25/2019   CREATININE 0.90 08/25/2019   BILITOT 0.7 08/25/2019   ALKPHOS 81 08/30/2017   AST 18 08/25/2019   ALT 17 08/25/2019   PROT 6.8 08/25/2019   ALBUMIN 4.2 08/30/2017   CALCIUM 9.4 08/25/2019   ANIONGAP 13 08/30/2017   Lab Results  Component Value Date   CHOL 146 08/25/2019   Lab Results  Component Value Date   HDL 43 (L) 08/25/2019   Lab Results  Component Value Date   LDLCALC 81 08/25/2019   Lab Results  Component Value Date   TRIG 120 08/25/2019   Lab Results  Component Value Date   CHOLHDL 3.4 08/25/2019   Lab Results  Component Value Date   HGBA1C 5.3 02/19/2019      Assessment & Plan:   Problem  List Items Addressed This Visit      Cardiovascular and Mediastinum   Essential hypertension    Well controlled. Continue current regimen. Follow up in  81mo        Digestive   Dry mouth    Unclear etiology at this point it seems really to affect her mostly at night which means it is less likely to be Sjogren's though she does interestingly have a positive anti-CCP so we will also check for Sjogren's on blood work.  Also recommend referral to ENT for further work-up I am not sure if she could have some nasal issues or blockage that might be causing her to mouth breathe more at night.  Fortunately she is negative for sleep apnea and has even tried allergy covers on her pillows.      Relevant Orders   Sjogren's syndrome antibods(ssa + ssb)   Ambulatory referral to ENT     Endocrine   Postsurgical hypothyroidism    Hypothyroidism - Primary    Due to recheck TSH and make any adjustments needed.  Continue current regimen.      Relevant Orders   TSH + free T4   T3, free     Musculoskeletal and Integument   Seborrheic dermatitis of scalp   Relevant Orders   Ambulatory referral to Dermatology     declinced flu vaccine.    She also requested dermatology referral for full skin check.  Referral placed today.  No orders of the defined types were placed in this encounter.   Follow-up: Return in about 6 months (around 09/27/2020) for Hypertension and thyroid .    Beatrice Lecher, MD

## 2020-04-16 DIAGNOSIS — E039 Hypothyroidism, unspecified: Secondary | ICD-10-CM | POA: Diagnosis not present

## 2020-04-16 DIAGNOSIS — R682 Dry mouth, unspecified: Secondary | ICD-10-CM | POA: Diagnosis not present

## 2020-04-19 LAB — SJOGREN'S SYNDROME ANTIBODS(SSA + SSB)
SSA (Ro) (ENA) Antibody, IgG: <1 AI
SSB (La) (ENA) Antibody, IgG: <1 AI

## 2020-04-20 ENCOUNTER — Encounter: Payer: Self-pay | Admitting: Family Medicine

## 2020-04-27 ENCOUNTER — Other Ambulatory Visit: Payer: Self-pay

## 2020-04-27 ENCOUNTER — Ambulatory Visit (INDEPENDENT_AMBULATORY_CARE_PROVIDER_SITE_OTHER): Payer: Medicare Other | Admitting: Otolaryngology

## 2020-04-27 ENCOUNTER — Encounter (INDEPENDENT_AMBULATORY_CARE_PROVIDER_SITE_OTHER): Payer: Self-pay | Admitting: Otolaryngology

## 2020-04-27 VITALS — Temp 97.0°F

## 2020-04-27 DIAGNOSIS — J310 Chronic rhinitis: Secondary | ICD-10-CM | POA: Diagnosis not present

## 2020-04-27 DIAGNOSIS — R682 Dry mouth, unspecified: Secondary | ICD-10-CM | POA: Diagnosis not present

## 2020-04-27 NOTE — Progress Notes (Signed)
HPI: Anne [name] is a 69 y.o. female who presents is referred by by her PCP for evaluation of chronic dry mouth that occurs at night when she sleeps.  Patient does really complain of dry mouth during the daytime but the dry mouth frequently wakes her up at night and causes her a lot of discomfort.  She has had a sleep test that did not demonstrate significant sleep apnea but did demonstrate chronic mouth breathing and snoring..  Past Medical History:  Diagnosis Date  . Acid reflux   . Aortic atherosclerosis ([place]) 04/28/2013   CT 08/2017  . Arthritis    "joints, hands" (08/30/2017)  . Chronic lower back pain   . Disc herniation    neck and low back  . Family history of adverse reaction to anesthesia    "daughter w/PONV"  . Follicular cystitis   . Hiatal hernia   . High cholesterol   . Hyperlipidemia    ELEV CHOLESTEROL  . Hypothyroidism   . Osteopenia 2014   T score -1.6 FRAX 13%/1%  . PAF (paroxysmal atrial fibrillation) ([place]) 08/30/2017   Echo 2/19: EF 55-60, normal wall motion, grade 1 diastolic dysfunction, mild PI, trivial TR //  Nuc 3/19: no ischemia or scar, EF 80 // CHADS2-VASc: 3 // Eliquis 5 bid   . Schatzki's ring    Past Surgical History:  Procedure Laterality Date  . BREAST BIOPSY Bilateral 1997   "excisional bx both benign"  . BREAST CYST ASPIRATION Right ~ 1993  . CATARACT EXTRACTION W/ INTRAOCULAR LENS IMPLANT Left 2011  . CATARACT EXTRACTION W/ INTRAOCULAR LENS IMPLANT Right 12/20/2012   Dr. Haynes Bast, OD  . COLONOSCOPY WITH ESOPHAGOGASTRODUODENOSCOPY (EGD)  "several"   "? dilated esophagus" (08/30/2017)  . DILATION AND CURETTAGE OF UTERUS  1996  . EYE MUSCLE SURGERY Bilateral 1992  . HERNIA REPAIR    . HYSTEROSCOPY    . LAPAROSCOPIC CHOLECYSTECTOMY  09/28/2011  . OOPHORECTOMY Right 04/2000   LAP RSO/LYSIS OF ADHESIONS  . THYROIDECTOMY  1976  . TUBAL LIGATION  1976  . VARICOSE VEIN SURGERY Bilateral 1972  . VENTRAL HERNIA REPAIR  06/23/13   Dr.  Franz Dell   Social History   Socioeconomic History  . Marital status: Married    Spouse name: [name]  . Number of children: 1  . Years of education: 58  . Highest education level: GED or equivalent  Occupational History  . Occupation: Teaching laboratory technician    Comment: retired  Tobacco Use  . Smoking status: Former Smoker    Packs/day: 0.10    Years: 18.00    Pack years: 1.80    Types: Cigarettes    Quit date: 01/27/1979    Years since quitting: 41.2  . Smokeless tobacco: Never Used  Vaping Use  . Vaping Use: Never used  Substance and Sexual Activity  . Alcohol use: Not Currently  . Drug use: No  . Sexual activity: Not Currently    Partners: Male    Birth control/protection: Post-menopausal  Other Topics Concern  . Not on file  Social History Narrative   4 brothers and 6 sisters. All 4 brother deceased.  2 sisters deceased.  No regular exercise. Still working.     Social Determinants of Health   Financial Resource Strain:   . Difficulty of Paying Living Expenses: Not on file  Food Insecurity:   . Worried About Charity fundraiser in the Last Year: Not on file  . Ran Out of Food in  the Last Year: Not on file  Transportation Needs:   . Lack of Transportation (Medical): Not on file  . Lack of Transportation (Non-Medical): Not on file  Physical Activity:   . Days of Exercise per Week: Not on file  . Minutes of Exercise per Session: Not on file  Stress:   . Feeling of Stress : Not on file  Social Connections:   . Frequency of Communication with Friends and Family: Not on file  . Frequency of Social Gatherings with Friends and Family: Not on file  . Attends Religious Services: Not on file  . Active Member of Clubs or Organizations: Not on file  . Attends Archivist Meetings: Not on file  . Marital Status: Not on file   Family History  Problem Relation Age of Onset  . Cancer Brother 14       melanoma  . Melanoma Brother   . Prostate cancer Brother   .  Leukemia Brother   . Pulmonary fibrosis Brother   . Stroke Sister   . Hypertension Sister   . Arthritis Sister   . Lupus Sister   . Cancer Sister   . Breast cancer Sister        Age 61  . Rheum arthritis Sister   . Arthritis Daughter        Rheumatoid  . Hyperparathyroidism Daughter   . Heart attack Father 70  . Heart disease Father   . Anuerysm Father   . Hypertension Mother    Allergies  Allergen Reactions  . Amoxicillin-Pot Clavulanate     REACTION: blisters in colon (Augmentin)  . Ceftriaxone Sodium     REACTION: throat swells (ROCEPHIN)  . Cephalexin   . Codeine     REACTION: nausea  . Molds & Smuts   . Zofran [Ondansetron Hcl]     Headaches   Prior to Admission medications   Medication Sig Start Date End Date Taking? Authorizing Provider  CALCIUM PO Take 1 tablet by mouth daily.    Yes [provider]  Cholecalciferol (VITAMIN D) 2000 UNITS tablet Take 2,000 Units by mouth daily.     Yes [provider]  ELIQUIS 5 MG TABS tablet TAKE 1 TABLET BY MOUTH TWICE A DAY 03/29/20  Yes Nahser, Wonda Cheng, MD  estradiol (ESTRACE) 0.1 MG/GM vaginal cream Place 6.19 Applicatorfuls vaginally as needed. For vaginal irritation 06/05/14  Yes Fontaine, Belinda Block, MD  meloxicam (MOBIC) 7.5 MG tablet Take 1 tablet (7.5 mg total) by mouth daily. 03/10/20  Yes [name], MD  metoprolol succinate (TOPROL-XL) 50 MG 24 hr tablet TAKE 1 AND 1/2 TABS DAILY TO = 75 MG DAILY 05/13/19  Yes Richardson Dopp T, PA-C  Multiple Vitamin (MULTIVITAMIN) tablet Take 1 tablet by mouth daily.     Yes [provider]  Multiple Vitamins-Minerals (ZINC PO) Take 1 tablet by mouth daily.   Yes [provider]  omeprazole (PRILOSEC) 40 MG capsule TAKE 1 CAPSULE (40 MG TOTAL) BY MOUTH DAILY. 09/06/15  Yes [name], MD  simvastatin (ZOCOR) 40 MG tablet TAKE 1 TABLET BY MOUTH EVERY DAY 07/21/19  Yes [name], MD  SYNTHROID 75 MCG tablet TAKE 1 TABLET  (75 MCG TOTAL) BY MOUTH DAILY BEFORE BREAKFAST. 07/21/19  Yes [name], MD     Positive ROS: Otherwise negative  All other systems have been reviewed and were otherwise negative with the exception of those mentioned in the HPI and as above.  Physical  Exam: Constitutional: Alert, well-appearing, no acute distress Ears: External ears without lesions or tenderness. Ear canals are clear bilaterally with intact, clear TMs.  Nasal: External nose without lesions. Septum midline with mild rhinitis..  Nasopharyngoscopy endoscopy was performed and on nasal endoscopy nasal passages are clear bilaterally.  Both middle meatus regions are clear with no significant drainage noted.  No polyps noted.  Nasopharynx is clear on both sides. Oral: Lips and gums without lesions. Tongue and palate mucosa without lesions. Posterior oropharynx clear.  No significant postnasal drainage noted.  Small to average sized tonsils and uvula. Neck: No palpable adenopathy or masses Respiratory: Breathing comfortably  Skin: No facial/neck lesions or rash noted.  Procedures  Assessment: She has normal upper airway anatomy on clinical exam with mild rhinitis and no significant nasal obstruction noted. Dry mouth secondary to chronic mouth breathing at night.  Plan: Recommended use of Nasacort 2 sprays each nostril at night as this will help improve her nasal airway.  Also discussed with her concerning using a device to help keep her mouth closed at night and perhaps get a humidifier next to the bed to help provide more humidity in the air which will also help with dry mouth. Also suggested perhaps use of Biotene at night when she goes to bed may help some with dry mouth.   Radene Journey, MD   CC:

## 2020-05-11 DIAGNOSIS — L820 Inflamed seborrheic keratosis: Secondary | ICD-10-CM | POA: Diagnosis not present

## 2020-05-11 DIAGNOSIS — L821 Other seborrheic keratosis: Secondary | ICD-10-CM | POA: Diagnosis not present

## 2020-05-11 DIAGNOSIS — D3610 Benign neoplasm of peripheral nerves and autonomic nervous system, unspecified: Secondary | ICD-10-CM | POA: Diagnosis not present

## 2020-05-11 DIAGNOSIS — L570 Actinic keratosis: Secondary | ICD-10-CM | POA: Diagnosis not present

## 2020-05-11 DIAGNOSIS — L218 Other seborrheic dermatitis: Secondary | ICD-10-CM | POA: Diagnosis not present

## 2020-05-24 ENCOUNTER — Other Ambulatory Visit: Payer: Self-pay | Admitting: Physician Assistant

## 2020-06-09 DIAGNOSIS — Z20822 Contact with and (suspected) exposure to covid-19: Secondary | ICD-10-CM | POA: Diagnosis not present

## 2020-06-24 DIAGNOSIS — E039 Hypothyroidism, unspecified: Secondary | ICD-10-CM | POA: Diagnosis not present

## 2020-06-24 DIAGNOSIS — R682 Dry mouth, unspecified: Secondary | ICD-10-CM | POA: Diagnosis not present

## 2020-06-25 LAB — T3, FREE: T3, Free: 3 pg/mL (ref 2.3–4.2)

## 2020-06-25 LAB — TSH+FREE T4: TSH W/REFLEX TO FT4: 1.71 mIU/L (ref 0.40–4.50)

## 2020-06-27 ENCOUNTER — Encounter: Payer: Self-pay | Admitting: Cardiovascular Disease

## 2020-06-27 NOTE — Progress Notes (Signed)
Visit was cancelled due to weather    This encounter was created in error - please disregard.

## 2020-06-28 ENCOUNTER — Encounter: Payer: Medicare Other | Admitting: Cardiovascular Disease

## 2020-07-26 ENCOUNTER — Encounter: Payer: Self-pay | Admitting: Cardiovascular Disease

## 2020-07-26 ENCOUNTER — Encounter: Payer: Self-pay | Admitting: Family Medicine

## 2020-07-26 ENCOUNTER — Ambulatory Visit (INDEPENDENT_AMBULATORY_CARE_PROVIDER_SITE_OTHER): Payer: Medicare Other | Admitting: Family Medicine

## 2020-07-26 ENCOUNTER — Other Ambulatory Visit: Payer: Self-pay

## 2020-07-26 VITALS — BP 116/57 | HR 87 | Ht 65.0 in | Wt 172.0 lb

## 2020-07-26 DIAGNOSIS — H00015 Hordeolum externum left lower eyelid: Secondary | ICD-10-CM | POA: Diagnosis not present

## 2020-07-26 MED ORDER — ERYTHROMYCIN 5 MG/GM OP OINT: 1.0000 "application " | TOPICAL_OINTMENT | Freq: Three times a day (TID) | OPHTHALMIC | 0 refills | Status: DC | PRN

## 2020-07-26 NOTE — Progress Notes (Signed)
Acute Office Visit  Subjective:    Patient ID: Anne [name], female    DOB: 03/08/1951, 70 y.o.   MRN: [id number]  Chief Complaint  Patient presents with  . Stye    X4 days. She has been using warm compresses x 2 days she stated that the area is sore and tender.     HPI Patient is in today for an eye on her left lower eyelid for about 4 days she is been using some warm compresses she does report that it feels sore and tender.  Reports she had been rubbing her eyes a little bit more frequently than usual  Past Medical History:  Diagnosis Date  . Acid reflux   . Aortic atherosclerosis ([place]) 04/28/2013   CT 08/2017  . Arthritis    "joints, hands" (08/30/2017)  . Chronic lower back pain   . Disc herniation    neck and low back  . Family history of adverse reaction to anesthesia    "daughter w/PONV"  . Follicular cystitis   . Hiatal hernia   . High cholesterol   . Hyperlipidemia    ELEV CHOLESTEROL  . Hypothyroidism   . Osteopenia 2014   T score -1.6 FRAX 13%/1%  . PAF (paroxysmal atrial fibrillation) ([place]) 08/30/2017   Echo 2/19: EF 55-60, normal wall motion, grade 1 diastolic dysfunction, mild PI, trivial TR //  Nuc 3/19: no ischemia or scar, EF 80 // CHADS2-VASc: 3 // Eliquis 5 bid   . Schatzki's ring     Past Surgical History:  Procedure Laterality Date  . BREAST BIOPSY Bilateral 1997   "excisional bx both benign"  . BREAST CYST ASPIRATION Right ~ 1993  . CATARACT EXTRACTION W/ INTRAOCULAR LENS IMPLANT Left 2011  . CATARACT EXTRACTION W/ INTRAOCULAR LENS IMPLANT Right 12/20/2012   Dr. Haynes Bast, OD  . COLONOSCOPY WITH ESOPHAGOGASTRODUODENOSCOPY (EGD)  "several"   "? dilated esophagus" (08/30/2017)  . DILATION AND CURETTAGE OF UTERUS  1996  . EYE MUSCLE SURGERY Bilateral 1992  . HERNIA REPAIR    . HYSTEROSCOPY    . LAPAROSCOPIC CHOLECYSTECTOMY  09/28/2011  . OOPHORECTOMY Right 04/2000   LAP RSO/LYSIS OF ADHESIONS  . THYROIDECTOMY  1976  . TUBAL  LIGATION  1976  . VARICOSE VEIN SURGERY Bilateral 1972  . VENTRAL HERNIA REPAIR  06/23/13   Dr. Franz Dell    Family History  Problem Relation Age of Onset  . Cancer Brother 104       melanoma  . Melanoma Brother   . Prostate cancer Brother   . Leukemia Brother   . Pulmonary fibrosis Brother   . Stroke Sister   . Hypertension Sister   . Arthritis Sister   . Lupus Sister   . Cancer Sister   . Breast cancer Sister        Age 42  . Rheum arthritis Sister   . Arthritis Daughter        Rheumatoid  . Hyperparathyroidism Daughter   . Heart attack Father 66  . Heart disease Father   . Anuerysm Father   . Hypertension Mother     Social History   Socioeconomic History  . Marital status: Married    Spouse name: [name]  . Number of children: 1  . Years of education: 62  . Highest education level: GED or equivalent  Occupational History  . Occupation: Teaching laboratory technician    Comment: retired  Tobacco Use  . Smoking status: Former Smoker    Packs/day:  0.10    Years: 18.00    Pack years: 1.80    Types: Cigarettes    Quit date: 01/27/1979    Years since quitting: 41.5  . Smokeless tobacco: Never Used  Vaping Use  . Vaping Use: Never used  Substance and Sexual Activity  . Alcohol use: Not Currently  . Drug use: No  . Sexual activity: Not Currently    Partners: Male    Birth control/protection: Post-menopausal  Other Topics Concern  . Not on file  Social History Narrative   4 brothers and 6 sisters. All 4 brother deceased.  2 sisters deceased.  No regular exercise. Still working.     Social Determinants of Health   Financial Resource Strain: Not on file  Food Insecurity: Not on file  Transportation Needs: Not on file  Physical Activity: Not on file  Stress: Not on file  Social Connections: Not on file  Intimate Partner Violence: Not on file    Outpatient Medications Prior to Visit  Medication Sig Dispense Refill  . CALCIUM PO Take 1 tablet by mouth daily.     .  Cholecalciferol (VITAMIN D) 2000 UNITS tablet Take 2,000 Units by mouth daily.    [name] ELIQUIS 5 MG TABS tablet TAKE 1 TABLET BY MOUTH TWICE A DAY 180 tablet 1  . estradiol (ESTRACE) 0.1 MG/GM vaginal cream Place 5.36 Applicatorfuls vaginally as needed. For vaginal irritation 42.5 g 2  . meloxicam (MOBIC) 7.5 MG tablet Take 1 tablet (7.5 mg total) by mouth daily. 90 tablet 1  . metoprolol succinate (TOPROL-XL) 50 MG 24 hr tablet TAKE 1 AND 1/2 TABS DAILY TO = 75 MG DAILY 135 tablet 3  . Multiple Vitamin (MULTIVITAMIN) tablet Take 1 tablet by mouth daily.    . Multiple Vitamins-Minerals (ZINC PO) Take 1 capsule by mouth daily.    [name] omeprazole (PRILOSEC) 40 MG capsule TAKE 1 CAPSULE (40 MG TOTAL) BY MOUTH DAILY. 90 capsule 1  . simvastatin (ZOCOR) 40 MG tablet TAKE 1 TABLET BY MOUTH EVERY DAY 90 tablet 3  . SYNTHROID 75 MCG tablet TAKE 1 TABLET (75 MCG TOTAL) BY MOUTH DAILY BEFORE BREAKFAST. 90 tablet 2  . Multiple Vitamins-Minerals (ZINC PO) Take 1 tablet by mouth daily.     No facility-administered medications prior to visit.    Allergies  Allergen Reactions  . Amoxicillin-Pot Clavulanate     REACTION: blisters in colon (Augmentin)  . Ceftriaxone Sodium     REACTION: throat swells (ROCEPHIN)  . Cephalexin   . Codeine     REACTION: nausea  . Molds & Smuts   . Zofran [Ondansetron Hcl]     Headaches    Review of Systems     Objective:    Physical Exam Vitals reviewed.  Constitutional:      Appearance: She is well-developed and well-nourished.  HENT:     Head: Normocephalic and atraumatic.     Comments: Left lower lower lid with mild erythematous papule with white behind the lid.   Eyes:     Extraocular Movements: EOM normal.     Conjunctiva/sclera: Conjunctivae normal.  Cardiovascular:     Rate and Rhythm: Normal rate.  Pulmonary:     Effort: Pulmonary effort is normal.  Skin:    General: Skin is dry.     Coloration: Skin is not pale.  Neurological:     Mental Status:  She is alert and oriented to person, place, and time.  Psychiatric:  Mood and Affect: Mood and affect normal.        Behavior: Behavior normal.     BP (!) 116/57   Pulse 87   Ht 5\' 5"  (1.651 m)   Wt 172 lb (78 kg)   LMP 09/08/2000   SpO2 98%   BMI 28.62 kg/m  Wt Readings from Last 3 Encounters:  07/26/20 172 lb (78 kg)  03/29/20 173 lb (78.5 kg)  03/09/20 172 lb (78 kg)    There are no preventive care reminders to display for this patient.  There are no preventive care reminders to display for this patient.   Lab Results  Component Value Date   TSH 0.85 12/09/2019   Lab Results  Component Value Date   WBC 6.0 06/03/2019   HGB 13.8 06/03/2019   HCT 41.6 06/03/2019   MCV 87 06/03/2019   PLT 192 06/03/2019   Lab Results  Component Value Date   NA 138 08/25/2019   K 4.5 08/25/2019   CO2 28 08/25/2019   GLUCOSE 119 (H) 08/25/2019   BUN 12 08/25/2019   CREATININE 0.90 08/25/2019   BILITOT 0.7 08/25/2019   ALKPHOS 81 08/30/2017   AST 18 08/25/2019   ALT 17 08/25/2019   PROT 6.8 08/25/2019   ALBUMIN 4.2 08/30/2017   CALCIUM 9.4 08/25/2019   ANIONGAP 13 08/30/2017   Lab Results  Component Value Date   CHOL 146 08/25/2019   Lab Results  Component Value Date   HDL 43 (L) 08/25/2019   Lab Results  Component Value Date   LDLCALC 81 08/25/2019   Lab Results  Component Value Date   TRIG 120 08/25/2019   Lab Results  Component Value Date   CHOLHDL 3.4 08/25/2019   Lab Results  Component Value Date   HGBA1C 5.3 02/19/2019       Assessment & Plan:   Problem List Items Addressed This Visit   None   Visit Diagnoses    Hordeolum externum of left lower eyelid    -  Primary     Stye of left lower eyelid-recommend continue with warm compresses and mild pressure.  We will treat with erythromycin ophthalmic ointment as well to help moisturize the eyes.  Ok to try fish oil, as there is some evidence that it might be helpful.  Call if not somewhat  improved over the next 2 weeks.   Meds ordered this encounter  Medications  . erythromycin ophthalmic ointment    Sig: Place 1 application into the left eye 3 (three) times daily as needed.    Dispense:  3.5 g    Refill:  0     Beatrice Lecher, MD

## 2020-07-26 NOTE — Patient Instructions (Signed)

## 2020-07-26 NOTE — Progress Notes (Signed)
Cardiology Office Note:    Date:  07/27/2020   ID:  Anne, [name] 05/17/1951, MRN [id number]  PCP:  Anne Marry, MD  Cardiologist:  Anne Moores, MD  Electrophysiologist:  None   Referring MD: Anne [name], *   Chief Complaint  Patient presents with  . Congestive Heart Failure     Previous notes    Anne [name] is a 70 y.o. female with a hx of paroxysmal atrial fibrillation, hypertension, hyperlipidemia, hypothyroidism with prior thyroid thyroidectomy.    I met her in March , 2019 in the hospital when she was admitted with rapid AFib. The Afib Spontaneously converted.  She had a mild troponin bump.     Her TSH was very low and it was thought that she may have been over replaced with her Synthroid. Sleep study in June, 2019 was negative for obstructive sleep apnea.  He was recently seen by Anne Dopp, PA in July for further evaluation of rapid atrial fibrillation.   CHADS2-VASc=4 (age x1, female, HTN, aortic atherosclerosis).   Dec. 20, 2019 Anne [name] is seen with husband, Anne [name]   No further episodes of Afib Uses a Kardia monitor.   Husband also has Afib  Has a hissing sound in her right ear ,  Has seen ENT.   June 03, 2019:   Anne [name] is seen today for follow-up of her paroxysmal atrial fibrillation and chronic diastolic congestive heart failure. No CP or dyspnea No significant palpitations Has remained in NSR - has PAF,  Remains on Eliquis   Feb. 1, 2022 Anne [name] is seen today for follow up of her PAF and chronic diastolic CHF Is very active.  Works in the yard.   No CP , no dyspnea. Anne [name] is wondering if she needs to continue taking the Eliquis - she  Has not had an episode of Afib since she had the brief episode while in the hospital with hyperthypoidism.  Her PAF was a single episode of    Past Medical History:  Diagnosis Date  . Acid reflux   . Aortic atherosclerosis ([place]) 04/28/2013   CT 08/2017  . Arthritis     "joints, hands" (08/30/2017)  . Chronic lower back pain   . Disc herniation    neck and low back  . Family history of adverse reaction to anesthesia    "daughter w/PONV"  . Follicular cystitis   . Hiatal hernia   . High cholesterol   . Hyperlipidemia    ELEV CHOLESTEROL  . Hypothyroidism   . Osteopenia 2014   T score -1.6 FRAX 13%/1%  . PAF (paroxysmal atrial fibrillation) ([place]) 08/30/2017   Echo 2/19: EF 55-60, normal wall motion, grade 1 diastolic dysfunction, mild PI, trivial TR //  Nuc 3/19: no ischemia or scar, EF 80 // CHADS2-VASc: 3 // Eliquis 5 bid   . Schatzki's ring     Past Surgical History:  Procedure Laterality Date  . BREAST BIOPSY Bilateral 1997   "excisional bx both benign"  . BREAST CYST ASPIRATION Right ~ 1993  . CATARACT EXTRACTION W/ INTRAOCULAR LENS IMPLANT Left 2011  . CATARACT EXTRACTION W/ INTRAOCULAR LENS IMPLANT Right 12/20/2012   Dr. Haynes Bast, OD  . COLONOSCOPY WITH ESOPHAGOGASTRODUODENOSCOPY (EGD)  "several"   "? dilated esophagus" (08/30/2017)  . DILATION AND CURETTAGE OF UTERUS  1996  . EYE MUSCLE SURGERY Bilateral 1992  . HERNIA REPAIR    . HYSTEROSCOPY    . LAPAROSCOPIC CHOLECYSTECTOMY  09/28/2011  . OOPHORECTOMY Right 04/2000  LAP RSO/LYSIS OF ADHESIONS  . THYROIDECTOMY  1976  . TUBAL LIGATION  1976  . VARICOSE VEIN SURGERY Bilateral 1972  . VENTRAL HERNIA REPAIR  06/23/13   Dr. Franz Dell    Current Medications: Current Meds  Medication Sig  . CALCIUM PO Take 1 tablet by mouth daily.   . Cholecalciferol (VITAMIN D) 2000 UNITS tablet Take 2,000 Units by mouth daily.  [name] erythromycin ophthalmic ointment Place 1 application into the left eye 3 (three) times daily as needed.  [name] estradiol (ESTRACE) 0.1 MG/GM vaginal cream Place 1.61 Applicatorfuls vaginally as needed. For vaginal irritation  . meloxicam (MOBIC) 7.5 MG tablet Take 1 tablet (7.5 mg total) by mouth daily.  . metoprolol succinate (TOPROL-XL) 50 MG 24 hr tablet TAKE 1 AND  1/2 TABS DAILY TO = 75 MG DAILY  . Multiple Vitamin (MULTIVITAMIN) tablet Take 1 tablet by mouth daily.  . Multiple Vitamins-Minerals (ZINC PO) Take 1 capsule by mouth daily.  [name] omeprazole (PRILOSEC) 40 MG capsule TAKE 1 CAPSULE (40 MG TOTAL) BY MOUTH DAILY.  . simvastatin (ZOCOR) 40 MG tablet TAKE 1 TABLET BY MOUTH EVERY DAY  . SYNTHROID 75 MCG tablet TAKE 1 TABLET (75 MCG TOTAL) BY MOUTH DAILY BEFORE BREAKFAST.  . [DISCONTINUED] ELIQUIS 5 MG TABS tablet TAKE 1 TABLET BY MOUTH TWICE A DAY     Allergies:   Amoxicillin-pot clavulanate, Ceftriaxone sodium, Cephalexin, Codeine, Molds & smuts, and Zofran [ondansetron hcl]   Social History   Socioeconomic History  . Marital status: Married    Spouse name: Anne [name]  . Number of children: 1  . Years of education: 24  . Highest education level: GED or equivalent  Occupational History  . Occupation: Teaching laboratory technician    Comment: retired  Tobacco Use  . Smoking status: Former Smoker    Packs/day: 0.10    Years: 18.00    Pack years: 1.80    Types: Cigarettes    Quit date: 01/27/1979    Years since quitting: 41.5  . Smokeless tobacco: Never Used  Vaping Use  . Vaping Use: Never used  Substance and Sexual Activity  . Alcohol use: Not Currently  . Drug use: No  . Sexual activity: Not Currently    Partners: Male    Birth control/protection: Post-menopausal  Other Topics Concern  . Not on file  Social History Narrative   4 brothers and 6 sisters. All 4 brother deceased.  2 sisters deceased.  No regular exercise. Still working.     Social Determinants of Health   Financial Resource Strain: Not on file  Food Insecurity: Not on file  Transportation Needs: Not on file  Physical Activity: Not on file  Stress: Not on file  Social Connections: Not on file     Family History: The patient's family history includes Anuerysm in her father; Arthritis in her daughter and sister; Breast cancer in her sister; Cancer in her sister; Cancer (age of  onset: 41) in her brother; Heart attack (age of onset: 27) in her father; Heart disease in her father; Hyperparathyroidism in her daughter; Hypertension in her mother and sister; Leukemia in her brother; Lupus in her sister; Melanoma in her brother; Prostate cancer in her brother; Pulmonary fibrosis in her brother; Rheum arthritis in her sister; Stroke in her sister.  ROS:   Please see the history of present illness.     All other systems reviewed and are negative.  EKGs/Labs/Other Studies Reviewed:    The following studies were reviewed today:  EKG:   Feb. 1, 2022:   NSR at 71.  Is I think we will start back to stop at this time just just continues 5 years ago nurse Welcome  Recent Labs: 08/25/2019: ALT 17; BUN 12; Creat 0.90; Potassium 4.5; Sodium 138 12/09/2019: TSH 0.85  Recent Lipid Panel    Component Value Date/Time   CHOL 146 08/25/2019 1052   TRIG 120 08/25/2019 1052   HDL 43 (L) 08/25/2019 1052   CHOLHDL 3.4 08/25/2019 1052   VLDL 20 08/31/2017 0233   LDLCALC 81 08/25/2019 1052    Physical Exam:    Physical Exam: Blood pressure 122/72, pulse 73, height 5' 5" (1.651 m), weight 171 lb 12.8 oz (77.9 kg), last menstrual period 09/08/2000, SpO2 97 %.  GEN:  Well nourished, well developed in no acute distress HEENT: Normal NECK: No JVD; No carotid bruits LYMPHATICS: No lymphadenopathy CARDIAC: RRR , no murmurs, rubs, gallops RESPIRATORY:  Clear to auscultation without rales, wheezing or rhonchi  ABDOMEN: Soft, non-tender, non-distended MUSCULOSKELETAL:  No edema; No deformity  SKIN: Warm and dry NEUROLOGIC:  Alert and oriented x 3   EKG:  Feb. 1, 2022.   NSR at 73.  No St or T wave changes.    ASSESSMENT:    1. Paroxysmal atrial fibrillation (HCC)    PLAN:    :   Paroxysmal Atrial fib:   She had an episode of atrial fibrillation in the setting of hyperthyroidism.  This occurred several years ago in the hospital.  She has been maintained on Eliquis since that  time.  She has been checking her heart rate and has not had any further episodes of atrial fibrillation.  She wanted to know if she could stop the Eliquis because he has been stable.  At this point I do think that she may discontinue the Eliquis.  She had a single episode of a sick visit that was associated with hyperthyroidism.  She will continue to monitor for A. fib with her Jodelle Red mobile device. She may DC to eliquis   2.  Hypothyroidism:  On replacement    3.   Fatigue:      Medication Adjustments/Labs and Tests Ordered: Current medicines are reviewed at length with the patient today.  Concerns regarding medicines are outlined above.  Orders Placed This Encounter  Procedures  . EKG 12-Lead   No orders of the defined types were placed in this encounter.   Patient Instructions  Medication Instructions:  Your physician has recommended you make the following change in your medication:   STOP TAKING ELIQUIS 5MG.   *If you need a refill on your cardiac medications before your next appointment, please call your pharmacy*   Lab Work: None today    Testing/Procedures: None ordered   Follow-Up: At [facility], you and your health needs are our priority.  As part of our continuing mission to provide you with exceptional heart care, we have created designated Provider Care Teams.  These Care Teams include your primary Cardiologist (physician) and Advanced Practice Providers (APPs -  Physician Assistants and Nurse Practitioners) who all work together to provide you with the care you need, when you need it.  Your next appointment:   1 year(s)  The format for your next appointment:   In Person  Provider:   You may see Anne Moores, MD or one of the following Advanced Practice Providers on your designated Care Team:    Anne Dopp, PA-C  [place], [place]  Signed, Anne Moores, MD  07/27/2020 3:11 PM    [facility]

## 2020-07-27 ENCOUNTER — Ambulatory Visit (INDEPENDENT_AMBULATORY_CARE_PROVIDER_SITE_OTHER): Payer: Medicare Other | Admitting: Cardiovascular Disease

## 2020-07-27 ENCOUNTER — Encounter: Payer: Self-pay | Admitting: Cardiovascular Disease

## 2020-07-27 VITALS — BP 122/72 | HR 73 | Ht 65.0 in | Wt 171.8 lb

## 2020-07-27 DIAGNOSIS — I480 Paroxysmal atrial fibrillation: Secondary | ICD-10-CM

## 2020-07-27 NOTE — Patient Instructions (Signed)
Medication Instructions:  Your physician has recommended you make the following change in your medication:   STOP TAKING ELIQUIS 5MG .   *If you need a refill on your cardiac medications before your next appointment, please call your pharmacy*   Lab Work: None today    Testing/Procedures: None ordered   Follow-Up: At [facility], you and your health needs are our priority.  As part of our continuing mission to provide you with exceptional heart care, we have created designated Provider Care Teams.  These Care Teams include your primary Cardiologist (physician) and Advanced Practice Providers (APPs -  Physician Assistants and Nurse Practitioners) who all work together to provide you with the care you need, when you need it.  Your next appointment:   1 year(s)  The format for your next appointment:   In Person  Provider:   You may see Mertie Moores, MD or one of the following Advanced Practice Providers on your designated Care Team:    Richardson Dopp, PA-C  [place], [place]

## 2020-07-28 ENCOUNTER — Telehealth: Payer: Self-pay

## 2020-07-28 NOTE — Telephone Encounter (Signed)
Anne [name] states the Erythromycin ointment is causing vision problems. She would like to switch to the drops. Please advise.

## 2020-07-28 NOTE — Telephone Encounter (Signed)
Blurry vision is not unusual. Usually last 30-60 minutes.  Just make sure using a thin 1/2 strip ( like squeezing toothpaste) to eye and not more than that or eye can stay gooopy.

## 2020-07-28 NOTE — Telephone Encounter (Signed)
Patient advised.

## 2020-08-05 ENCOUNTER — Other Ambulatory Visit: Payer: Self-pay | Admitting: Family Medicine

## 2020-08-05 DIAGNOSIS — E039 Hypothyroidism, unspecified: Secondary | ICD-10-CM

## 2020-08-09 ENCOUNTER — Other Ambulatory Visit: Payer: Self-pay

## 2020-08-09 ENCOUNTER — Ambulatory Visit (INDEPENDENT_AMBULATORY_CARE_PROVIDER_SITE_OTHER): Payer: Medicare Other | Admitting: Family Medicine

## 2020-08-09 VITALS — BP 124/53 | HR 80 | Temp 98.4°F | Resp 18 | Ht 65.0 in | Wt 174.0 lb

## 2020-08-09 DIAGNOSIS — Z0000 Encounter for general adult medical examination without abnormal findings: Secondary | ICD-10-CM | POA: Diagnosis not present

## 2020-08-09 NOTE — Progress Notes (Signed)
MEDICARE ANNUAL WELLNESS VISIT  08/09/2020  Subjective:  Anne [name] is a 70 y.o. female patient of Metheney, Rene Kocher, MD who had a Medicare Annual Wellness Visit today. Anne [name] is Retired and lives with their spouse. she has 1 child. she reports that she is socially active and does interact with friends/family regularly. she is minimally physically active and enjoys cooking and crossword puzzles.  Patient Care Team: [name], MD as PCP - General (Family Medicine) Nahser, Wonda Cheng, MD as PCP - Cardiology (Cardiology) Gwendel Hanson, MD as Referring Physician (Urology) Phineas Real, Belinda Block, MD (Inactive) as Consulting Physician (Gynecology) Jyl Heinz, MD as Referring Physician (Endocrinology) Vernie Ammons, MD as Referring Physician (Dermatology)  Advanced Directives 08/09/2020 08/04/2019 12/06/2017 08/30/2017 08/02/2017 01/25/2015 11/11/2014  Does Patient Have a Medical Advance Directive? No No No No No No No  Would patient like information on creating a medical advance directive? No - Patient declined No - Patient declined No - Patient declined No - Patient declined No - Patient declined No - patient declined information -    Hospital Utilization Over the Past 12 Months: # of hospitalizations or ER visits: 0 # of surgeries: 0  Review of Systems    Patient reports that her overall health is better when compared to last year.  Review of Systems: History obtained from chart review and the patient  All other systems negative.  Pain Assessment Pain : No/denies pain     Current Medications & Allergies (verified) Allergies as of 08/09/2020      Reactions   Amoxicillin-pot Clavulanate    REACTION: blisters in colon (Augmentin)   Ceftriaxone Sodium    REACTION: throat swells (ROCEPHIN)   Cephalexin    Codeine    REACTION: nausea   Molds & Smuts    Zofran [ondansetron Hcl]    Headaches      Medication List       Accurate as of  August 09, 2020  4:01 PM. If you have any questions, ask your nurse or doctor.        CALCIUM PO Take 1 tablet by mouth daily.   erythromycin ophthalmic ointment Place 1 application into the left eye 3 (three) times daily as needed.   estradiol 0.1 MG/GM vaginal cream Commonly known as: ESTRACE Place 1.60 Applicatorfuls vaginally as needed. For vaginal irritation   meloxicam 7.5 MG tablet Commonly known as: MOBIC Take 1 tablet (7.5 mg total) by mouth daily.   metoprolol succinate 50 MG 24 hr tablet Commonly known as: TOPROL-XL TAKE 1 AND 1/2 TABS DAILY TO = 75 MG DAILY   multivitamin tablet Take 1 tablet by mouth daily.   omeprazole 40 MG capsule Commonly known as: PRILOSEC TAKE 1 CAPSULE (40 MG TOTAL) BY MOUTH DAILY.   simvastatin 40 MG tablet Commonly known as: ZOCOR TAKE 1 TABLET BY MOUTH EVERY DAY   Synthroid 75 MCG tablet Generic drug: levothyroxine TAKE 1 TABLET (75 MCG TOTAL) BY MOUTH DAILY BEFORE BREAKFAST.   Vitamin D 50 MCG (2000 UT) tablet Take 2,000 Units by mouth daily.   ZINC PO Take 1 capsule by mouth daily.       History (reviewed): Past Medical History:  Diagnosis Date  . Acid reflux   . Aortic atherosclerosis ([place]) 04/28/2013   CT 08/2017  . Arthritis    "joints, hands" (08/30/2017)  . Chronic lower back pain   . Disc herniation    neck and low back  . Family history of adverse  reaction to anesthesia    "daughter w/PONV"  . Follicular cystitis   . Hiatal hernia   . High cholesterol   . Hyperlipidemia    ELEV CHOLESTEROL  . Hypothyroidism   . Osteopenia 2014   T score -1.6 FRAX 13%/1%  . PAF (paroxysmal atrial fibrillation) ([place]) 08/30/2017   Echo 2/19: EF 55-60, normal wall motion, grade 1 diastolic dysfunction, mild PI, trivial TR //  Nuc 3/19: no ischemia or scar, EF 80 // CHADS2-VASc: 3 // Eliquis 5 bid   . Schatzki's ring    Past Surgical History:  Procedure Laterality Date  . BREAST BIOPSY Bilateral 1997   "excisional bx  both benign"  . BREAST CYST ASPIRATION Right ~ 1993  . CATARACT EXTRACTION W/ INTRAOCULAR LENS IMPLANT Left 2011  . CATARACT EXTRACTION W/ INTRAOCULAR LENS IMPLANT Right 12/20/2012   Dr. Haynes Bast, OD  . COLONOSCOPY WITH ESOPHAGOGASTRODUODENOSCOPY (EGD)  "several"   "? dilated esophagus" (08/30/2017)  . DILATION AND CURETTAGE OF UTERUS  1996  . EYE MUSCLE SURGERY Bilateral 1992  . HERNIA REPAIR    . HYSTEROSCOPY    . LAPAROSCOPIC CHOLECYSTECTOMY  09/28/2011  . OOPHORECTOMY Right 04/2000   LAP RSO/LYSIS OF ADHESIONS  . THYROIDECTOMY  1976  . TUBAL LIGATION  1976  . VARICOSE VEIN SURGERY Bilateral 1972  . VENTRAL HERNIA REPAIR  06/23/13   Dr. Franz Dell   Family History  Problem Relation Age of Onset  . Cancer Brother 68       melanoma  . Melanoma Brother   . Prostate cancer Brother   . Leukemia Brother   . Pulmonary fibrosis Brother   . Stroke Sister   . Hypertension Sister   . Arthritis Sister   . Lupus Sister   . Cancer Sister   . Breast cancer Sister        Age 64  . Rheum arthritis Sister   . Arthritis Daughter        Rheumatoid  . Hyperparathyroidism Daughter   . Heart attack Father 31  . Heart disease Father   . Anuerysm Father   . Hypertension Mother    Social History   Socioeconomic History  . Marital status: Married    Spouse name: [name]  . Number of children: 1  . Years of education: 78  . Highest education level: GED or equivalent  Occupational History  . Occupation: Teaching laboratory technician    Comment: retired  Tobacco Use  . Smoking status: Former Smoker    Packs/day: 0.10    Years: 18.00    Pack years: 1.80    Types: Cigarettes    Quit date: 01/27/1979    Years since quitting: 41.5  . Smokeless tobacco: Never Used  Vaping Use  . Vaping Use: Never used  Substance and Sexual Activity  . Alcohol use: Yes    Alcohol/week: 1.0 standard drink    Types: 1 Glasses of wine per week    Comment: 2 glasses of wine a month  . Drug use: No  . Sexual  activity: Not Currently    Partners: Male    Birth control/protection: Post-menopausal  Other Topics Concern  . Not on file  Social History Narrative   4 brothers and 6 sisters. All 4 brother deceased.  2 sisters deceased.  Tries to go the park to walk with her husband. Likes to [employer] and do crossword puzzles.   Social Determinants of Health   Financial Resource Strain: Low Risk   . Difficulty of  Paying Living Expenses: Not hard at all  Food Insecurity: No Food Insecurity  . Worried About Charity fundraiser in the Last Year: Never true  . Ran Out of Food in the Last Year: Never true  Transportation Needs: No Transportation Needs  . Lack of Transportation (Medical): No  . Lack of Transportation (Non-Medical): No  Physical Activity: Insufficiently Active  . Days of Exercise per Week: 3 days  . Minutes of Exercise per Session: 20 min  Stress: No Stress Concern Present  . Feeling of Stress : Not at all  Social Connections: Moderately Isolated  . Frequency of Communication with Friends and Family: More than three times a week  . Frequency of Social Gatherings with Friends and Family: More than three times a week  . Attends Religious Services: Never  . Active Member of Clubs or Organizations: No  . Attends Archivist Meetings: Never  . Marital Status: Married    Activities of Daily Living In your present state of health, do you have any difficulty performing the following activities: 08/09/2020  Hearing? N  Vision? N  Difficulty concentrating or making decisions? N  Walking or climbing stairs? N  Dressing or bathing? N  Doing errands, shopping? N  Preparing Food and eating ? N  Using the Toilet? N  In the past six months, have you accidently leaked urine? N  Do you have problems with loss of bowel control? N  Managing your Medications? N  Managing your Finances? N  Housekeeping or managing your Housekeeping? N  Some recent data might be hidden    Patient  Education/Literacy How often do you need to have someone help you when you read instructions, pamphlets, or other written materials from your doctor or pharmacy?: 1 - Never What is the last grade level you completed in school?: GED  Exercise Current Exercise Habits: Home exercise routine, Type of exercise: walking, Time (Minutes): 20, Frequency (Times/Week): 3, Weekly Exercise (Minutes/Week): 60, Intensity: Moderate, Exercise limited by: None identified  Diet Patient reports consuming 3 meals a day and 1 snack(s) a day Patient reports that her primary diet is: Regular Patient reports that she does have regular access to food.   Depression Screen PHQ 2/9 Scores 08/09/2020 08/04/2019 02/19/2019 04/22/2018 09/06/2017 02/08/2017 02/04/2016  PHQ - 2 Score 0 0 0 0 0 0 0  PHQ- 9 Score - - - - 0 - -     Fall Risk Fall Risk  08/09/2020 08/04/2019 02/19/2019 04/22/2018 02/08/2017  Falls in the past year? 0 0 0 No No  Number falls in past yr: 0 - 0 - -  Injury with Fall? 0 - 0 - -  Risk for fall due to : No Fall Risks No Fall Risks - - -  Follow up Falls evaluation completed Falls prevention discussed - - -     Objective:   BP (!) 124/53 (BP Location: Right Arm, Patient Position: Sitting, Cuff Size: Normal)   Pulse 80   Temp 98.4 F (36.9 C) (Oral)   Resp 18   Ht 5\' 5"  (1.651 m)   Wt 174 lb 0.6 oz (78.9 kg)   LMP 09/08/2000   SpO2 97%   BMI 28.96 kg/m   Last Weight  Most recent update: 08/09/2020  3:06 PM   Weight  78.9 kg (174 lb 0.6 oz)            Body mass index is 28.96 kg/m.  Hearing/Vision  . Anne [name] did not have difficulty  with hearing/understanding during the face-to-face interview . Anne [name] did not have difficulty with her vision during the face-to-face interview . Reports that she has had a formal eye exam by an eye care professional within the past year . Reports that she has not had a formal hearing evaluation within the past year  Cognitive Function: 6CIT Screen 08/09/2020  08/04/2019 02/08/2017  What Year? 0 points 0 points 0 points  What month? 0 points 0 points 0 points  What time? 0 points 0 points 0 points  Count back from 20 0 points 0 points 0 points  Months in reverse 2 points 0 points 0 points  Repeat phrase 0 points 0 points 0 points  Total Score 2 0 0    Normal Cognitive Function Screening: Yes (Normal:0-7, Significant for Dysfunction: >8)  Immunization & Health Maintenance Record Immunization History  Administered Date(s) Administered  . Influenza Split 06/02/2011, 05/03/2012  . Influenza Whole 04/16/2009  . Influenza,inj,Quad PF,6+ Mos 05/01/2013, 04/17/2014  . Pneumococcal Conjugate-13 10/17/2013  . Pneumococcal Polysaccharide-23 02/04/2016  . Td 01/21/2010  . Zoster 08/18/2011    Health Maintenance  Topic Date Due  . INFLUENZA VACCINE  09/23/2020 (Originally 01/25/2020)  . TETANUS/TDAP  03/29/2021 (Originally 01/22/2020)  . COVID-19 Vaccine (1) 04/14/2021 (Originally 09/09/1955)  . MAMMOGRAM  02/24/2022  . COLONOSCOPY (Pts 45-35yrs Insurance coverage will need to be confirmed)  02/09/2023  . DEXA SCAN  08/05/2024  . Hepatitis C Screening  Completed  . PNA vac Low Risk Adult  Completed       Assessment  This is a routine wellness examination for [employer].  Health Maintenance: Due or Overdue There are no preventive care reminders to display for this patient.  Anne [name] does not need a referral for Community Assistance: Care Management:   no Social Work:    no Prescription Assistance:  no Nutrition/Diabetes Education:  no   Plan:  Personalized Goals Goals Addressed              This Visit's Progress   .  Patient Stated (pt-stated)        08/09/2020 AWV Goal: Improved Nutrition/Diet  . Patient will verbalize understanding that diet plays an important role in overall health and that a poor diet is a risk factor for many chronic medical conditions.  . Over the next year, patient will improve  self management of their diet by incorporating fewer sweetened foods & beverages. . Patient will utilize available community resources to help with food acquisition if needed (ex: food pantries, [address], etc) . Patient will work with nutrition specialist if a referral was made       Personalized Health Maintenance & Screening Recommendations  Influenza vaccine Td vaccine  Shingrix vaccine Covid Vaccine  Lung Cancer Screening Recommended: no (Low Dose CT Chest recommended if Age 52-80 years, 30 pack-year currently smoking OR have quit w/in past 15 years) Hepatitis C Screening recommended: no HIV Screening recommended: yes  Advanced Directives: Written information was not given per the patient's request.  Referrals & Orders No orders of the defined types were placed in this encounter.   Follow-up Plan . Follow-up with [name], MD as planned . Schedule an appointment at your pharmacy for your tetanus vaccine and shingrix vaccine. . Let us know if you change your mind about the flu vaccine, that can be done in the office.   I have personally reviewed and noted the following in the patient's chart:   . Medical  and social history . Use of alcohol, tobacco or illicit drugs  . Current medications and supplements . Functional ability and status . Nutritional status . Physical activity . Advanced directives . List of other physicians . Hospitalizations, surgeries, and ER visits in previous 12 months . Vitals . Screenings to include cognitive, depression, and falls . Referrals and appointments  In addition, I have reviewed and discussed with patient certain preventive protocols, quality metrics, and best practice recommendations. A written personalized care plan for preventive services as well as general preventive health recommendations were provided to patient.     Anne Gens, RN  08/09/2020

## 2020-08-09 NOTE — Patient Instructions (Addendum)
Health Maintenance, Female Adopting a healthy lifestyle and getting preventive care are important in promoting health and wellness. Ask your health care provider about:  The right schedule for you to have regular tests and exams.  Things you can do on your own to prevent diseases and keep yourself healthy. What should I know about diet, weight, and exercise? Eat a healthy diet  Eat a diet that includes plenty of vegetables, fruits, low-fat dairy products, and lean protein.  Do not eat a lot of foods that are high in solid fats, added sugars, or sodium.   Maintain a healthy weight Body mass index (BMI) is used to identify weight problems. It estimates body fat based on height and weight. Your health care provider can help determine your BMI and help you achieve or maintain a healthy weight. Get regular exercise Get regular exercise. This is one of the most important things you can do for your health. Most adults should:  Exercise for at least 150 minutes each week. The exercise should increase your heart rate and make you sweat (moderate-intensity exercise).  Do strengthening exercises at least twice a week. This is in addition to the moderate-intensity exercise.  Spend less time sitting. Even light physical activity can be beneficial. Watch cholesterol and blood lipids Have your blood tested for lipids and cholesterol at 72 years of age, then have this test every 5 years. Have your cholesterol levels checked more often if:  Your lipid or cholesterol levels are high.  You are older than 72 years of age.  You are at high risk for heart disease. What should I know about cancer screening? Depending on your health history and family history, you may need to have cancer screening at various ages. This may include screening for:  Breast cancer.  Cervical cancer.  Colorectal cancer.  Skin cancer.  Lung cancer. What should I know about heart disease, diabetes, and high blood  pressure? Blood pressure and heart disease  High blood pressure causes heart disease and increases the risk of stroke. This is more likely to develop in people who have high blood pressure readings, are of African descent, or are overweight.  Have your blood pressure checked: ? Every 3-5 years if you are 38-35 years of age. ? Every year if you are 19 years old or older. Diabetes Have regular diabetes screenings. This checks your fasting blood sugar level. Have the screening done:  Once every three years after age 64 if you are at a normal weight and have a low risk for diabetes.  More often and at a younger age if you are overweight or have a high risk for diabetes. What should I know about preventing infection? Hepatitis B If you have a higher risk for hepatitis B, you should be screened for this virus. Talk with your health care provider to find out if you are at risk for hepatitis B infection. Hepatitis C Testing is recommended for:  Everyone born from [birth year] through 1967.  Anyone with known risk factors for hepatitis C. Sexually transmitted infections (STIs)  Get screened for STIs, including gonorrhea and chlamydia, if: ? You are sexually active and are younger than 72 years of age. ? You are older than 72 years of age and your health care provider tells you that you are at risk for this type of infection. ? Your sexual activity has changed since you were last screened, and you are at increased risk for chlamydia or gonorrhea. Ask your health care  provider if you are at risk.  Ask your health care provider about whether you are at high risk for HIV. Your health care provider may recommend a prescription medicine to help prevent HIV infection. If you choose to take medicine to prevent HIV, you should first get tested for HIV. You should then be tested every 3 months for as long as you are taking the medicine. Pregnancy  If you are about to stop having your period (premenopausal) and  you may become pregnant, seek counseling before you get pregnant.  Take 400 to 800 micrograms (mcg) of folic acid every day if you become pregnant.  Ask for birth control (contraception) if you want to prevent pregnancy. Osteoporosis and menopause Osteoporosis is a disease in which the bones lose minerals and strength with aging. This can result in bone fractures. If you are 40 years old or older, or if you are at risk for osteoporosis and fractures, ask your health care provider if you should:  Be screened for bone loss.  Take a calcium or vitamin D supplement to lower your risk of fractures.  Be given hormone replacement therapy (HRT) to treat symptoms of menopause. Follow these instructions at home: Lifestyle  Do not use any products that contain nicotine or tobacco, such as cigarettes, e-cigarettes, and chewing tobacco. If you need help quitting, ask your health care provider.  Do not use street drugs.  Do not share needles.  Ask your health care provider for help if you need support or information about quitting drugs. Alcohol use  Do not drink alcohol if: ? Your health care provider tells you not to drink. ? You are pregnant, may be pregnant, or are planning to become pregnant.  If you drink alcohol: ? Limit how much you use to 0-1 drink a day. ? Limit intake if you are breastfeeding.  Be aware of how much alcohol is in your drink. In the [place] one drink equals one 12 oz bottle of beer (355 mL), one 5 oz glass of wine (148 mL), or one 1 oz glass of hard liquor (44 mL). General instructions  Schedule regular health, dental, and eye exams.  Stay current with your vaccines.  Tell your health care provider if: ? You often feel depressed. ? You have ever been abused or do not feel safe at home. Summary  Adopting a healthy lifestyle and getting preventive care are important in promoting health and wellness.  Follow your health care provider's instructions about healthy  diet, exercising, and getting tested or screened for diseases.  Follow your health care provider's instructions on monitoring your cholesterol and blood pressure. This information is not intended to replace advice given to you by your health care provider. Make sure you discuss any questions you have with your health care provider. Document Revised: 06/05/2018 Document Reviewed: 06/05/2018 Elsevier Patient Education  2021 [place] Maintenance Summary and Written Plan of Care  Anne [name] ,  Thank you for allowing me to perform your Medicare Annual Wellness Visit and for your ongoing commitment to your health.   Health Maintenance & Immunization History Health Maintenance  Topic Date Due  . INFLUENZA VACCINE  09/23/2020 (Originally 01/25/2020)  . TETANUS/TDAP  03/29/2021 (Originally 01/22/2020)  . COVID-19 Vaccine (1) 04/14/2021 (Originally 09/09/1955)  . MAMMOGRAM  02/24/2022  . COLONOSCOPY (Pts 45-41yrs Insurance coverage will need to be confirmed)  02/09/2023  . DEXA SCAN  08/05/2024  . Hepatitis C Screening  Completed  . PNA vac Low Risk Adult  Completed   Immunization History  Administered Date(s) Administered  . Influenza Split 06/02/2011, 05/03/2012  . Influenza Whole 04/16/2009  . Influenza,inj,Quad PF,6+ Mos 05/01/2013, 04/17/2014  . Pneumococcal Conjugate-13 10/17/2013  . Pneumococcal Polysaccharide-23 02/04/2016  . Td 01/21/2010  . Zoster 08/18/2011    These are the patient goals that we discussed: Goals Addressed              This Visit's Progress   .  Patient Stated (pt-stated)        08/09/2020 AWV Goal: Improved Nutrition/Diet  . Patient will verbalize understanding that diet plays an important role in overall health and that a poor diet is a risk factor for many chronic medical conditions.  . Over the next year, patient will improve self management of their diet by incorporating fewer sweetened foods &  beverages. . Patient will utilize available community resources to help with food acquisition if needed (ex: food pantries, [address], etc) . Patient will work with nutrition specialist if a referral was made         This is a list of Health Maintenance Items that are overdue or due now: Influenza vaccine Td vaccine  Shingrix vaccine Covid Vaccine  Orders/Referrals Placed Today: No orders of the defined types were placed in this encounter.  Follow-up Plan . Follow-up with Anne Marry, MD as planned . Schedule an appointment at your pharmacy for your tetanus vaccine and shingrix vaccine. . Let us know if you change your mind about the flu vaccine, that can be done in the office.

## 2020-09-27 ENCOUNTER — Other Ambulatory Visit: Payer: Self-pay

## 2020-09-27 ENCOUNTER — Encounter: Payer: Self-pay | Admitting: Family Medicine

## 2020-09-27 ENCOUNTER — Ambulatory Visit (INDEPENDENT_AMBULATORY_CARE_PROVIDER_SITE_OTHER): Payer: Medicare Other | Admitting: Family Medicine

## 2020-09-27 VITALS — BP 113/55 | HR 75 | Ht 65.0 in | Wt 177.0 lb

## 2020-09-27 DIAGNOSIS — E039 Hypothyroidism, unspecified: Secondary | ICD-10-CM | POA: Diagnosis not present

## 2020-09-27 DIAGNOSIS — I10 Essential (primary) hypertension: Secondary | ICD-10-CM

## 2020-09-27 DIAGNOSIS — I700 Atherosclerosis of aorta: Secondary | ICD-10-CM | POA: Diagnosis not present

## 2020-09-27 DIAGNOSIS — E785 Hyperlipidemia, unspecified: Secondary | ICD-10-CM | POA: Diagnosis not present

## 2020-09-27 DIAGNOSIS — Z8679 Personal history of other diseases of the circulatory system: Secondary | ICD-10-CM | POA: Diagnosis not present

## 2020-09-27 DIAGNOSIS — I5032 Chronic diastolic (congestive) heart failure: Secondary | ICD-10-CM

## 2020-09-27 DIAGNOSIS — E890 Postprocedural hypothyroidism: Secondary | ICD-10-CM

## 2020-09-27 NOTE — Assessment & Plan Note (Signed)
Stable.  No volume overload.

## 2020-09-27 NOTE — Assessment & Plan Note (Signed)
Well controlled. Continue current regimen. Follow up in  6 mo  

## 2020-09-27 NOTE — Assessment & Plan Note (Signed)
No recent CP 

## 2020-09-27 NOTE — Assessment & Plan Note (Signed)
No longer on Eliquis. D/C'd by Cardiology.

## 2020-09-27 NOTE — Patient Instructions (Signed)
Please get your Tdap vaccine.

## 2020-09-27 NOTE — Assessment & Plan Note (Signed)
Due to recheck TSH. 

## 2020-09-27 NOTE — Progress Notes (Signed)
Established Patient Office Visit  Subjective:  Patient ID: Anne [name], female    DOB: 1950-07-04  Age: 70 y.o. MRN: [id number]  CC:  Chief Complaint  Patient presents with  . Hypertension    HPI Anne [name] presents for   Hypertension- Pt denies chest pain, SOB, dizziness, or heart palpitations.  Taking meds as directed w/o problems.  Denies medication side effects.    Follow - up aortic atherosclerosis-  Hypothyroidism - Taking medication regularly in the AM away from food and vitamins, etc. No recent change to skin, hair, or energy levels.  F/U GERD -she is currently on omeprazole daily.  Uses her Nasocort at night.  She feels like her mouth is really dry and feels like her membranes stick together.    She mostly mouth breaths. She was negative for sleep apnea.    Past Medical History:  Diagnosis Date  . Acid reflux   . Aortic atherosclerosis ([place]) 04/28/2013   CT 08/2017  . Arthritis    "joints, hands" (08/30/2017)  . Chronic lower back pain   . Disc herniation    neck and low back  . Family history of adverse reaction to anesthesia    "daughter w/PONV"  . Follicular cystitis   . Hiatal hernia   . High cholesterol   . Hyperlipidemia    ELEV CHOLESTEROL  . Hypothyroidism   . Osteopenia 2014   T score -1.6 FRAX 13%/1%  . PAF (paroxysmal atrial fibrillation) ([place]) 08/30/2017   Echo 2/19: EF 55-60, normal wall motion, grade 1 diastolic dysfunction, mild PI, trivial TR //  Nuc 3/19: no ischemia or scar, EF 80 // CHADS2-VASc: 3 // Eliquis 5 bid   . Schatzki's ring     Past Surgical History:  Procedure Laterality Date  . BREAST BIOPSY Bilateral 1997   "excisional bx both benign"  . BREAST CYST ASPIRATION Right ~ 1993  . CATARACT EXTRACTION W/ INTRAOCULAR LENS IMPLANT Left 2011  . CATARACT EXTRACTION W/ INTRAOCULAR LENS IMPLANT Right 12/20/2012   Dr. Haynes Bast, OD  . COLONOSCOPY WITH ESOPHAGOGASTRODUODENOSCOPY (EGD)  "several"   "? dilated  esophagus" (08/30/2017)  . DILATION AND CURETTAGE OF UTERUS  1996  . EYE MUSCLE SURGERY Bilateral 1992  . HERNIA REPAIR    . HYSTEROSCOPY    . LAPAROSCOPIC CHOLECYSTECTOMY  09/28/2011  . OOPHORECTOMY Right 04/2000   LAP RSO/LYSIS OF ADHESIONS  . THYROIDECTOMY  1976  . TUBAL LIGATION  1976  . VARICOSE VEIN SURGERY Bilateral 1972  . VENTRAL HERNIA REPAIR  06/23/13   Dr. Franz Dell    Family History  Problem Relation Age of Onset  . Cancer Brother 57       melanoma  . Melanoma Brother   . Prostate cancer Brother   . Leukemia Brother   . Pulmonary fibrosis Brother   . Stroke Sister   . Hypertension Sister   . Arthritis Sister   . Lupus Sister   . Cancer Sister   . Breast cancer Sister        Age 49  . Rheum arthritis Sister   . Arthritis Daughter        Rheumatoid  . Hyperparathyroidism Daughter   . Heart attack Father 18  . Heart disease Father   . Anuerysm Father   . Hypertension Mother     Social History   Socioeconomic History  . Marital status: Married    Spouse name: Anne [name]  . Number of children: 1  . Years  of education: 10  . Highest education level: GED or equivalent  Occupational History  . Occupation: Teaching laboratory technician    Comment: retired  Tobacco Use  . Smoking status: Former Smoker    Packs/day: 0.10    Years: 18.00    Pack years: 1.80    Types: Cigarettes    Quit date: 01/27/1979    Years since quitting: 41.6  . Smokeless tobacco: Never Used  Vaping Use  . Vaping Use: Never used  Substance and Sexual Activity  . Alcohol use: Yes    Alcohol/week: 1.0 standard drink    Types: 1 Glasses of wine per week    Comment: 2 glasses of wine a month  . Drug use: No  . Sexual activity: Not Currently    Partners: Male    Birth control/protection: Post-menopausal  Other Topics Concern  . Not on file  Social History Narrative   4 brothers and 6 sisters. All 4 brother deceased.  2 sisters deceased.  Tries to go the park to walk with her husband. Likes to  [employer] and do crossword puzzles.   Social Determinants of Health   Financial Resource Strain: Low Risk   . Difficulty of Paying Living Expenses: Not hard at all  Food Insecurity: No Food Insecurity  . Worried About Charity fundraiser in the Last Year: Never true  . Ran Out of Food in the Last Year: Never true  Transportation Needs: No Transportation Needs  . Lack of Transportation (Medical): No  . Lack of Transportation (Non-Medical): No  Physical Activity: Insufficiently Active  . Days of Exercise per Week: 3 days  . Minutes of Exercise per Session: 20 min  Stress: No Stress Concern Present  . Feeling of Stress : Not at all  Social Connections: Moderately Isolated  . Frequency of Communication with Friends and Family: More than three times a week  . Frequency of Social Gatherings with Friends and Family: More than three times a week  . Attends Religious Services: Never  . Active Member of Clubs or Organizations: No  . Attends Archivist Meetings: Never  . Marital Status: Married  Human resources officer Violence: Not At Risk  . Fear of Current or Ex-Partner: No  . Emotionally Abused: No  . Physically Abused: No  . Sexually Abused: No    Outpatient Medications Prior to Visit  Medication Sig Dispense Refill  . CALCIUM PO Take 1 tablet by mouth daily.     . Cholecalciferol (VITAMIN D) 2000 UNITS tablet Take 2,000 Units by mouth daily.    [name] estradiol (ESTRACE) 0.1 MG/GM vaginal cream Place 3.35 Applicatorfuls vaginally as needed. For vaginal irritation 42.5 g 2  . meloxicam (MOBIC) 7.5 MG tablet Take 1 tablet (7.5 mg total) by mouth daily. 90 tablet 1  . metoprolol succinate (TOPROL-XL) 50 MG 24 hr tablet TAKE 1 AND 1/2 TABS DAILY TO = 75 MG DAILY 135 tablet 3  . Multiple Vitamin (MULTIVITAMIN) tablet Take 1 tablet by mouth daily.    . Multiple Vitamins-Minerals (ZINC PO) Take 1 capsule by mouth daily.    [name] omeprazole (PRILOSEC) 40 MG capsule TAKE 1 CAPSULE (40 MG TOTAL) BY  MOUTH DAILY. 90 capsule 1  . simvastatin (ZOCOR) 40 MG tablet TAKE 1 TABLET BY MOUTH EVERY DAY 90 tablet 3  . SYNTHROID 75 MCG tablet TAKE 1 TABLET (75 MCG TOTAL) BY MOUTH DAILY BEFORE BREAKFAST. 90 tablet 2  . erythromycin ophthalmic ointment Place 1 application into the left eye 3 (  three) times daily as needed. (Patient not taking: Reported on 08/09/2020) 3.5 g 0   No facility-administered medications prior to visit.    Allergies  Allergen Reactions  . Amoxicillin-Pot Clavulanate     REACTION: blisters in colon (Augmentin)  . Ceftriaxone Sodium     REACTION: throat swells (ROCEPHIN)  . Cephalexin   . Codeine     REACTION: nausea  . Molds & Smuts   . Zofran [Ondansetron Hcl]     Headaches    ROS Review of Systems    Objective:    Physical Exam Constitutional:      Appearance: She is well-developed.  HENT:     Head: Normocephalic and atraumatic.  Cardiovascular:     Rate and Rhythm: Normal rate and regular rhythm.     Heart sounds: Normal heart sounds.  Pulmonary:     Effort: Pulmonary effort is normal.     Breath sounds: Normal breath sounds.  Skin:    General: Skin is warm and dry.  Neurological:     Mental Status: She is alert and oriented to person, place, and time.  Psychiatric:        Behavior: Behavior normal.     BP (!) 113/55   Pulse 75   Ht 5\' 5"  (1.651 m)   Wt 177 lb (80.3 kg)   LMP 09/08/2000   SpO2 99%   BMI 29.45 kg/m  Wt Readings from Last 3 Encounters:  09/27/20 177 lb (80.3 kg)  08/09/20 174 lb 0.6 oz (78.9 kg)  07/27/20 171 lb 12.8 oz (77.9 kg)     There are no preventive care reminders to display for this patient.  There are no preventive care reminders to display for this patient.  Lab Results  Component Value Date   TSH 0.85 12/09/2019   Lab Results  Component Value Date   WBC 6.0 06/03/2019   HGB 13.8 06/03/2019   HCT 41.6 06/03/2019   MCV 87 06/03/2019   PLT 192 06/03/2019   Lab Results  Component Value Date   NA  138 08/25/2019   K 4.5 08/25/2019   CO2 28 08/25/2019   GLUCOSE 119 (H) 08/25/2019   BUN 12 08/25/2019   CREATININE 0.90 08/25/2019   BILITOT 0.7 08/25/2019   ALKPHOS 81 08/30/2017   AST 18 08/25/2019   ALT 17 08/25/2019   PROT 6.8 08/25/2019   ALBUMIN 4.2 08/30/2017   CALCIUM 9.4 08/25/2019   ANIONGAP 13 08/30/2017   Lab Results  Component Value Date   CHOL 146 08/25/2019   Lab Results  Component Value Date   HDL 43 (L) 08/25/2019   Lab Results  Component Value Date   LDLCALC 81 08/25/2019   Lab Results  Component Value Date   TRIG 120 08/25/2019   Lab Results  Component Value Date   CHOLHDL 3.4 08/25/2019   Lab Results  Component Value Date   HGBA1C 5.3 02/19/2019      Assessment & Plan:   Problem List Items Addressed This Visit      Cardiovascular and Mediastinum   Essential hypertension - Primary    Well controlled. Continue current regimen. Follow up in  6 mo       Relevant Orders   Lipid panel   CBC   TSH   Lipid Panel w/reflex Direct LDL   COMPLETE METABOLIC PANEL WITH GFR   Chronic diastolic (congestive) heart failure (HCC)    Stable.  No volume overload.  Relevant Orders   Lipid panel   CBC   Aortic atherosclerosis ([place])    No recent CP.        Relevant Orders   Lipid panel   CBC   TSH     Endocrine   Postsurgical hypothyroidism    Due to recheck TSH      Relevant Orders   TSH + free T4   Hypothyroidism   Relevant Orders   TSH   Lipid Panel w/reflex Direct LDL   COMPLETE METABOLIC PANEL WITH GFR     Other   Hyperlipidemia    Due to recheck lipids.        Relevant Orders   Lipid panel   Lipid Panel w/reflex Direct LDL   COMPLETE METABOLIC PANEL WITH GFR   History of atrial fibrillation    No longer on Eliquis. D/C'd by Cardiology.          No orders of the defined types were placed in this encounter.   Follow-up: Return in about 6 months (around 03/29/2021) for thyroid.    Beatrice Lecher, MD

## 2020-09-27 NOTE — Assessment & Plan Note (Signed)
Due to recheck lipids. 

## 2020-09-28 DIAGNOSIS — E785 Hyperlipidemia, unspecified: Secondary | ICD-10-CM | POA: Diagnosis not present

## 2020-09-28 DIAGNOSIS — E890 Postprocedural hypothyroidism: Secondary | ICD-10-CM | POA: Diagnosis not present

## 2020-09-28 DIAGNOSIS — I700 Atherosclerosis of aorta: Secondary | ICD-10-CM | POA: Diagnosis not present

## 2020-09-28 DIAGNOSIS — I10 Essential (primary) hypertension: Secondary | ICD-10-CM | POA: Diagnosis not present

## 2020-09-28 DIAGNOSIS — E039 Hypothyroidism, unspecified: Secondary | ICD-10-CM | POA: Diagnosis not present

## 2020-09-28 DIAGNOSIS — I5032 Chronic diastolic (congestive) heart failure: Secondary | ICD-10-CM | POA: Diagnosis not present

## 2020-09-28 LAB — CBC
HCT: 43.3 % (ref 35.0–45.0)
Hemoglobin: 14 g/dL (ref 11.7–15.5)
MCH: 28.1 pg (ref 27.0–33.0)
MCHC: 32.3 g/dL (ref 32.0–36.0)
MCV: 86.9 fL (ref 80.0–100.0)
MPV: 10.8 fL (ref 7.5–12.5)
Platelets: 182 10*3/uL (ref 140–400)
RBC: 4.98 10*6/uL (ref 3.80–5.10)
RDW: 13.1 % (ref 11.0–15.0)
WBC: 5.3 10*3/uL (ref 3.8–10.8)

## 2020-09-29 LAB — COMPLETE METABOLIC PANEL WITH GFR
AG Ratio: 1.6 (calc) (ref 1.0–2.5)
ALT: 18 U/L (ref 6–29)
AST: 18 U/L (ref 10–35)
Albumin: 4.2 g/dL (ref 3.6–5.1)
Alkaline phosphatase (APISO): 60 U/L (ref 37–153)
BUN/Creatinine Ratio: 18 (calc) (ref 6–22)
BUN: 17 mg/dL (ref 7–25)
CO2: 29 mmol/L (ref 20–32)
Calcium: 9.5 mg/dL (ref 8.6–10.4)
Chloride: 104 mmol/L (ref 98–110)
Creat: 0.94 mg/dL — ABNORMAL HIGH (ref 0.60–0.93)
GFR, Est African American: 70 mL/min/{1.73_m2} (ref >=60)
GFR, Est Non African American: 61 mL/min/{1.73_m2} (ref >=60)
Globulin: 2.6 g/dL (calc) (ref 1.9–3.7)
Glucose, Bld: 121 mg/dL — ABNORMAL HIGH (ref 65–99)
Potassium: 4.4 mmol/L (ref 3.5–5.3)
Sodium: 139 mmol/L (ref 135–146)
Total Bilirubin: 0.7 mg/dL (ref 0.2–1.2)
Total Protein: 6.8 g/dL (ref 6.1–8.1)

## 2020-09-29 LAB — LIPID PANEL W/REFLEX DIRECT LDL
Cholesterol: 167 mg/dL (ref <200)
HDL: 48 mg/dL — ABNORMAL LOW (ref >=50)
LDL Cholesterol (Calc): 96 mg/dL (calc)
Non-HDL Cholesterol (Calc): 119 mg/dL (calc) (ref <130)
Total CHOL/HDL Ratio: 3.5 (calc) (ref <5.0)
Triglycerides: 133 mg/dL (ref <150)

## 2020-09-29 LAB — TSH+FREE T4: TSH W/REFLEX TO FT4: 2.36 mIU/L (ref 0.40–4.50)

## 2020-10-02 ENCOUNTER — Other Ambulatory Visit: Payer: Self-pay | Admitting: Cardiovascular Disease

## 2020-10-04 NOTE — Telephone Encounter (Signed)
Eliquis d/c'ed 07/27/20 - pt only had single episode of afib in setting of hyperthyroidism several years ago. Will deny refill.

## 2020-11-08 ENCOUNTER — Other Ambulatory Visit: Payer: Self-pay | Admitting: Family Medicine

## 2020-11-12 ENCOUNTER — Telehealth: Payer: Self-pay | Admitting: Family Medicine

## 2020-11-12 NOTE — Progress Notes (Signed)
  Chronic Care Management   Outreach Note  11/12/2020 Name: Anne [name] MRN: [id number] DOB: Dec 06, 1950  Referred by: [name], MD Reason for referral : No chief complaint on file.   An unsuccessful telephone outreach was attempted today. The patient was referred to the pharmacist for assistance with care management and care coordination.   Follow Up Plan:   Lauretta Grill Upstream Scheduler

## 2020-11-15 ENCOUNTER — Telehealth: Payer: Self-pay | Admitting: Family Medicine

## 2020-11-15 NOTE — Chronic Care Management (AMB) (Signed)
  Chronic Care Management   Note  11/15/2020 Name: Anne [name] MRN: [id number] DOB: 24-Sep-1950  Anne [name] is a 70 y.o. year old female who is a primary care patient of Madilyn Fireman, Rene Kocher, MD. I reached out to Jacklynn Ganong by phone today in response to a referral sent by Ms. Anne [name]'s PCP, [name], MD.   Anne [name] was given information about Chronic Care Management services today including:  1. CCM service includes personalized support from designated clinical staff supervised by her physician, including individualized plan of care and coordination with other care providers 2. 24/7 contact phone numbers for assistance for urgent and routine care needs. 3. Service will only be billed when office clinical staff spend 20 minutes or more in a month to coordinate care. 4. Only one practitioner may furnish and bill the service in a calendar month. 5. The patient may stop CCM services at any time (effective at the end of the month) by phone call to the office staff.   Patient agreed to services and verbal consent obtained.   Follow up plan:   Lauretta Grill Upstream Scheduler

## 2020-12-09 ENCOUNTER — Telehealth: Payer: Medicare Other

## 2021-01-24 ENCOUNTER — Other Ambulatory Visit: Payer: Self-pay | Admitting: Family Medicine

## 2021-01-24 DIAGNOSIS — Z1231 Encounter for screening mammogram for malignant neoplasm of breast: Secondary | ICD-10-CM

## 2021-01-28 ENCOUNTER — Telehealth: Payer: Self-pay

## 2021-01-28 MED ORDER — MELOXICAM 15 MG PO TABS: 15.0000 mg | ORAL_TABLET | Freq: Every day | ORAL | 1 refills | Status: DC

## 2021-01-28 NOTE — Telephone Encounter (Signed)
Meds ordered this encounter  Medications   meloxicam (MOBIC) 15 MG tablet    Sig: Take 1 tablet (15 mg total) by mouth daily.    Dispense:  90 tablet    Refill:  1

## 2021-01-28 NOTE — Telephone Encounter (Signed)
Pt called stating that she previously was taking meloxicam '15mg'$  for joint and hip pain.  However, when she started the Eliquis, Dr. Acie Fredrickson cut the dosage to 7.'5mg'$ .  Dr. Acie Fredrickson has now [place] and she would like to know if she can go back on '15mg'$  of meloxicam.  If so, pt will need an RX.  Charyl Bigger, CMA

## 2021-01-28 NOTE — Telephone Encounter (Signed)
Pt notified of refill.  Pt stated "you tell her that I love her and I thank her".

## 2021-02-22 NOTE — Progress Notes (Signed)
Office Visit Note  Patient: Anne [name]             Date of Birth: [birth year]           MRN: [id number]             PCP: [name], MD Referring: [name], * Visit Date: 03/08/2021 Occupation: '@GUAROCC'$ @  Subjective:  Pain of the Right Hip (Goes down right leg. Comes and goes.   History of Present Illness: Adileigh Casteneda is a 70 y.o. female with a history of osteoarthritis.  She states recently she has been experiencing pain and discomfort in the right trochanteric area which radiates down to her right leg.  She reports pelvic discomfort when she sleeps on the left side on her hip.  She also has some discomfort in her left knee joint.  She has noticed fluid retention in her left lower extremity.  She is also noticed discoloration in the leg.  She complains of deformities in her bilateral index fingers.  She has not had trigger finger experience recently.  Activities of Daily Living:  Patient reports morning stiffness for 30 minutes.   Patient Reports nocturnal pain.  Difficulty dressing/grooming: Denies Difficulty climbing stairs: Reports Difficulty getting out of chair: Denies Difficulty using hands for taps, buttons, cutlery, and/or writing: Denies  Review of Systems  Constitutional:  Negative for fatigue, night sweats, weight gain and weight loss.  HENT:  Positive for mouth dryness. Negative for mouth sores, trouble swallowing, trouble swallowing and nose dryness.        Due to snoring  Eyes:  Negative for pain, redness, visual disturbance and dryness.  Respiratory:  Negative for cough, shortness of breath and difficulty breathing.   Cardiovascular:  Negative for chest pain, palpitations, hypertension, irregular heartbeat and swelling in legs/feet.  Gastrointestinal:  Negative for blood in stool, constipation and diarrhea.  Endocrine: Negative for increased urination.  Genitourinary:  Negative for vaginal dryness.  Musculoskeletal:   Positive for joint pain, joint pain and morning stiffness. Negative for joint swelling, myalgias, muscle weakness, muscle tenderness and myalgias.  Skin:  Negative for color change, rash, hair loss, skin tightness, ulcers and sensitivity to sunlight.  Allergic/Immunologic: Negative for susceptible to infections.  Neurological:  Negative for dizziness, memory loss, night sweats and weakness.  Hematological:  Negative for swollen glands.  Psychiatric/Behavioral:  Negative for depressed mood and sleep disturbance. The patient is not nervous/anxious.    PMFS History:  Patient Active Problem List   Diagnosis Date Noted   Dry mouth 03/29/2020   Seborrheic dermatitis of scalp 01/22/2020   Cervical radiculopathy 08/25/2019   History of esophagogastroduodenoscopy (EGD) 02/08/2018   Colon cancer screening 02/08/2018   Essential hypertension 12/25/2017   GERD (gastroesophageal reflux disease) 08/30/2017   Chronic diastolic (congestive) heart failure ([place]) 08/30/2017   History of atrial fibrillation 08/30/2017   Flushing 07/30/2017   Paresthesia of both hands 07/30/2017   Postsurgical hypothyroidism 07/30/2017   Aortic atherosclerosis ([place]) 04/28/2013   S/P cataract extraction 01/16/2013   Pseudophakia of both eyes 12/20/2012   Schatzki's ring 10/16/2012   Spinal stenosis of lumbar region 08/16/2012   Vaginal atrophy 11/14/2011   Graves' disease 05/05/2011   Disc herniation    Osteopenia    PERSONAL HISTORY OF ALLERGY TO LATEX 12/23/2009   UNSPECIFIED DISEASE OF PHARYNX 12/16/2008   FATIGUE 11/10/2008   Intermittent palpitations 11/10/2008   Hypothyroidism 09/22/2008   Hyperlipidemia 09/22/2008   ALLERGIC RHINITIS 09/22/2008  Past Medical History:  Diagnosis Date   Acid reflux    Aortic atherosclerosis ([place]) 04/28/2013   CT 08/2017   Arthritis    "joints, hands" (08/30/2017)   Chronic lower back pain    Disc herniation    neck and low back   Family history of adverse reaction to  anesthesia    "daughter w/PONV"   Follicular cystitis    Hiatal hernia    High cholesterol    Hyperlipidemia    ELEV CHOLESTEROL   Hypothyroidism    Osteopenia 2014   T score -1.6 FRAX 13%/1%   PAF (paroxysmal atrial fibrillation) ([place]) 08/30/2017   Echo 2/19: EF 55-60, normal wall motion, grade 1 diastolic dysfunction, mild PI, trivial TR //  Nuc 3/19: no ischemia or scar, EF 31 // CHADS2-VASc: 3 // Eliquis 5 bid    Schatzki's ring     Family History  Problem Relation Age of Onset   Hypertension Mother    Heart attack Father 48   Heart disease Father    Anuerysm Father    Stroke Sister    Hypertension Sister    Arthritis Sister    Lupus Sister    Cancer Sister    Breast cancer Sister        Age 43   Rheum arthritis Sister    Stroke Sister    Cancer Brother 84       melanoma   Melanoma Brother    Prostate cancer Brother    Leukemia Brother    Pulmonary fibrosis Brother    Arthritis Daughter        Rheumatoid   Hyperparathyroidism Daughter    Past Surgical History:  Procedure Laterality Date   BREAST BIOPSY Bilateral 1997   "excisional bx both benign"   BREAST CYST ASPIRATION Right ~ [facility] IMPLANT Left 2011   CATARACT EXTRACTION W/ INTRAOCULAR LENS IMPLANT Right 12/20/2012   Dr. Haynes Bast, OD   COLONOSCOPY WITH ESOPHAGOGASTRODUODENOSCOPY (EGD)  "several"   "? dilated esophagus" (08/30/2017)   DILATION AND CURETTAGE OF UTERUS  1996   EYE MUSCLE SURGERY Bilateral 1992   HERNIA REPAIR     HYSTEROSCOPY     LAPAROSCOPIC CHOLECYSTECTOMY  09/28/2011   OOPHORECTOMY Right 04/2000   LAP RSO/LYSIS OF ADHESIONS   THYROIDECTOMY  1976   TUBAL LIGATION  1976   VARICOSE VEIN SURGERY Bilateral 1972   VENTRAL HERNIA REPAIR  06/23/13   Dr. Franz Dell   Social History   Social History Narrative   4 brothers and 6 sisters. All 4 brother deceased.  2 sisters deceased.  Tries to go the park to walk with her husband. Likes to [employer]  and do crossword puzzles.   Immunization History  Administered Date(s) Administered   Influenza Split 06/02/2011, 05/03/2012   Influenza Whole 04/16/2009   Influenza,inj,Quad PF,6+ Mos 05/01/2013, 04/17/2014   Pneumococcal Conjugate-13 10/17/2013   Pneumococcal Polysaccharide-23 02/04/2016   Td 01/21/2010   Zoster, Live 08/18/2011     Objective: Vital Signs: BP 130/71 (BP Location: Left Arm, Patient Position: Sitting, Cuff Size: Small)   Pulse 67   Resp 12   Ht '5\' 5"'$  (1.651 m)   Wt 182 lb 9.6 oz (82.8 kg)   LMP 09/08/2000   BMI 30.39 kg/m    Physical Exam Vitals and nursing note reviewed.  Constitutional:      Appearance: She is well-developed.  HENT:     Head: Normocephalic and atraumatic.  Eyes:  Conjunctiva/sclera: Conjunctivae normal.  Cardiovascular:     Rate and Rhythm: Normal rate and regular rhythm.     Heart sounds: Normal heart sounds.  Pulmonary:     Effort: Pulmonary effort is normal.     Breath sounds: Normal breath sounds.  Abdominal:     General: Bowel sounds are normal.     Palpations: Abdomen is soft.  Musculoskeletal:     Cervical back: Normal range of motion.  Lymphadenopathy:     Cervical: No cervical adenopathy.  Skin:    General: Skin is warm and dry.     Capillary Refill: Capillary refill takes less than 2 seconds.  Neurological:     Mental Status: She is alert and oriented to person, place, and time.  Psychiatric:        Behavior: Behavior normal.     Musculoskeletal Exam: C-spine was in good range of motion without much discomfort.  Shoulder joints, elbow joints, wrist joints with good range of motion.  She had bilateral PIP and DIP thickening with subluxation of bilateral first DIP joints.  Hip joints with good range of motion.  She had some tenderness over right superior Alica spine and right trochanteric region.  Knee joints and ankle joints with good range of motion.  She had discomfort with range of motion of her lumbar spine  especially on the right paravertebral region.  CDAI Exam: CDAI Score: -- Patient Global: --; Provider Global: -- Swollen: --; Tender: -- Joint Exam 03/08/2021   No joint exam has been documented for this visit   There is currently no information documented on the homunculus. Go to the Rheumatology activity and complete the homunculus joint exam.  Investigation: No additional findings.  Imaging: No results found.  Recent Labs: Lab Results  Component Value Date   WBC 5.3 09/28/2020   HGB 14.0 09/28/2020   PLT 182 09/28/2020   NA 139 09/28/2020   K 4.4 09/28/2020   CL 104 09/28/2020   CO2 29 09/28/2020   GLUCOSE 121 (H) 09/28/2020   BUN 17 09/28/2020   CREATININE 0.94 (H) 09/28/2020   BILITOT 0.7 09/28/2020   ALKPHOS 81 08/30/2017   AST 18 09/28/2020   ALT 18 09/28/2020   PROT 6.8 09/28/2020   ALBUMIN 4.2 08/30/2017   CALCIUM 9.5 09/28/2020   GFRAA 70 09/28/2020    Speciality Comments: No specialty comments available.  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  Primary osteoarthritis of both hands-she has severe osteoarthritis in her hands with subluxation of DIP joints.  Clinical findings are consistent with osteoarthritis.  Joint protection muscle strengthening was discussed.  Primary osteoarthritis of both knees - Bilateral mild osteoarthritis and moderate chondromalacia patella:  Trigger finger, left ring finger-resolved  Trigger finger, right middle finger-resolved  DDD (degenerative disc disease), cervical-she had good range of motion with minimal discomfort.  Spinal stenosis of lumbar region,  unspecified whether neurogenic claudication present-  Chronic right-sided low back pain with right-sided sciatica - Plan: XR Lumbar Spine 2-3 Views-x-ray showed severe levoscoliosis, multilevel spondylosis L4-L5 listhesis and facet joint arthropathy.  X-ray findings were discussed with the patient.  She has been experiencing increased lower back pain and right-sided radiculopathy.  I could not find any x-rays in the system.  Patient states she had radiographic evaluation many years ago.  A handout on lumbar spine exercises was given.[name]  She was referred to physical therapy.  Pain in right hip -she complains of discomfort range of motion of her right knee joint.  No limitation of range of motion was noted.  She has some tenderness over the anterior superior iliac spine and over the right trochanteric bursa.  Plan: XR HIP UNILAT W OR W/O PELVIS 2-3 VIEWS RIGHT.  X-ray of the hip joint was unremarkable.  A handout on IT band stretches was given.  Osteopenia of multiple sites-The BMD measured at Forearm Radius 33% is 0.675 g/cm2 with a T-score of -2.3.  Other medical problems are listed as follows:  Chronic diastolic (congestive) heart failure (HCC)  Paroxysmal atrial fibrillation (HCC)-patient states that she has not had any episode of atrial fibrillation and she was taken off Eliquis by her cardiologist.  Essential hypertension-her blood pressure is normal today.  Aortic atherosclerosis (HCC)  History of hyperlipidemia  History of gastroesophageal reflux (GERD)  Postsurgical hypothyroidism  Family history of rheumatoid arthritis  Former smoker   Orders: Orders Placed This Encounter  Procedures   XR Lumbar Spine 2-3 Views   XR HIP UNILAT W OR W/O PELVIS 2-3 VIEWS RIGHT    No orders of the defined types were placed in this encounter.    Follow-Up Instructions: Return in about 6 months (around 09/05/2021) for Osteoarthritis.   Bo Merino, MD  Note - This record has been  created using Editor, commissioning.  Chart creation errors have been sought, but may not always  have been located. Such creation errors do not reflect on  the standard of medical care.

## 2021-03-08 ENCOUNTER — Ambulatory Visit: Payer: Self-pay

## 2021-03-08 ENCOUNTER — Other Ambulatory Visit: Payer: Self-pay

## 2021-03-08 ENCOUNTER — Ambulatory Visit (INDEPENDENT_AMBULATORY_CARE_PROVIDER_SITE_OTHER): Payer: Medicare Other | Admitting: Rheumatology

## 2021-03-08 ENCOUNTER — Encounter: Payer: Self-pay | Admitting: Rheumatology

## 2021-03-08 VITALS — BP 130/71 | HR 67 | Resp 12 | Ht 65.0 in | Wt 182.6 lb

## 2021-03-08 DIAGNOSIS — M170 Bilateral primary osteoarthritis of knee: Secondary | ICD-10-CM

## 2021-03-08 DIAGNOSIS — Z8261 Family history of arthritis: Secondary | ICD-10-CM

## 2021-03-08 DIAGNOSIS — M48061 Spinal stenosis, lumbar region without neurogenic claudication: Secondary | ICD-10-CM | POA: Diagnosis not present

## 2021-03-08 DIAGNOSIS — Z8719 Personal history of other diseases of the digestive system: Secondary | ICD-10-CM

## 2021-03-08 DIAGNOSIS — E890 Postprocedural hypothyroidism: Secondary | ICD-10-CM

## 2021-03-08 DIAGNOSIS — M5441 Lumbago with sciatica, right side: Secondary | ICD-10-CM

## 2021-03-08 DIAGNOSIS — I10 Essential (primary) hypertension: Secondary | ICD-10-CM | POA: Diagnosis not present

## 2021-03-08 DIAGNOSIS — R7681 Abnormal rheumatoid factor and anti-citrullinated protein antibody without rheumatoid arthritis: Secondary | ICD-10-CM

## 2021-03-08 DIAGNOSIS — M65331 Trigger finger, right middle finger: Secondary | ICD-10-CM

## 2021-03-08 DIAGNOSIS — R768 Other specified abnormal immunological findings in serum: Secondary | ICD-10-CM

## 2021-03-08 DIAGNOSIS — M8589 Other specified disorders of bone density and structure, multiple sites: Secondary | ICD-10-CM | POA: Diagnosis not present

## 2021-03-08 DIAGNOSIS — Z8639 Personal history of other endocrine, nutritional and metabolic disease: Secondary | ICD-10-CM | POA: Diagnosis not present

## 2021-03-08 DIAGNOSIS — I480 Paroxysmal atrial fibrillation: Secondary | ICD-10-CM

## 2021-03-08 DIAGNOSIS — M19041 Primary osteoarthritis, right hand: Secondary | ICD-10-CM

## 2021-03-08 DIAGNOSIS — Z87891 Personal history of nicotine dependence: Secondary | ICD-10-CM

## 2021-03-08 DIAGNOSIS — G8929 Other chronic pain: Secondary | ICD-10-CM | POA: Diagnosis not present

## 2021-03-08 DIAGNOSIS — M19042 Primary osteoarthritis, left hand: Secondary | ICD-10-CM

## 2021-03-08 DIAGNOSIS — I700 Atherosclerosis of aorta: Secondary | ICD-10-CM | POA: Diagnosis not present

## 2021-03-08 DIAGNOSIS — M5030 Other cervical disc degeneration, unspecified cervical region: Secondary | ICD-10-CM | POA: Diagnosis not present

## 2021-03-08 DIAGNOSIS — I5032 Chronic diastolic (congestive) heart failure: Secondary | ICD-10-CM

## 2021-03-08 DIAGNOSIS — M25551 Pain in right hip: Secondary | ICD-10-CM

## 2021-03-08 DIAGNOSIS — M65342 Trigger finger, left ring finger: Secondary | ICD-10-CM

## 2021-03-08 NOTE — Patient Instructions (Addendum)
Back Exercises The following exercises strengthen the muscles that help to support the trunk (torso) and back. They also help to keep the lower back flexible. Doing these exercises can help to prevent or lessen existing low back pain. If you have back pain or discomfort, try doing these exercises 2-3 times each day or as told by your health care provider. As your pain improves, do them once each day, but increase the number of times that you repeat the steps for each exercise (do more repetitions). To prevent the recurrence of back pain, continue to do these exercises once each day or as told by your health care provider. Do exercises exactly as told by your health care provider and adjust them as directed. It is normal to feel mild stretching, pulling, tightness, or discomfort as you do these exercises, but you should stop right away if you feel sudden pain or your pain gets worse. Exercises Single knee to chest Repeat these steps 3-5 times for each leg: Lie on your back on a firm bed or the floor with your legs extended. Bring one knee to your chest. Your other leg should stay extended and in contact with the floor. Hold your knee in place by grabbing your knee or thigh with both hands and hold. Pull on your knee until you feel a gentle stretch in your lower back or buttocks. Hold the stretch for 10-30 seconds. Slowly release and straighten your leg. Pelvic tilt Repeat these steps 5-10 times: Lie on your back on a firm bed or the floor with your legs extended. Bend your knees so they are pointing toward the ceiling and your feet are flat on the floor. Tighten your lower abdominal muscles to press your lower back against the floor. This motion will tilt your pelvis so your tailbone points up toward the ceiling instead of pointing to your feet or the floor. With gentle tension and even breathing, hold this position for 5-10 seconds. Cat-cow Repeat these steps until your lower back becomes more  flexible: Get into a hands-and-knees position on a firm bed or the floor. Keep your hands under your shoulders, and keep your knees under your hips. You may place padding under your knees for comfort. Let your head hang down toward your chest. Contract your abdominal muscles and point your tailbone toward the floor so your lower back becomes rounded like the back of a cat. Hold this position for 5 seconds. Slowly lift your head, let your abdominal muscles relax, and point your tailbone up toward the ceiling so your back forms a sagging arch like the back of a cow. Hold this position for 5 seconds.  Press-ups Repeat these steps 5-10 times: Lie on your abdomen (face-down) on a firm bed or the floor. Place your palms near your head, about shoulder-width apart. Keeping your back as relaxed as possible and keeping your hips on the floor, slowly straighten your arms to raise the top half of your body and lift your shoulders. Do not use your back muscles to raise your upper torso. You may adjust the placement of your hands to make yourself more comfortable. Hold this position for 5 seconds while you keep your back relaxed. Slowly return to lying flat on the floor.  Bridges Repeat these steps 10 times: Lie on your back on a firm bed or the floor. Bend your knees so they are pointing toward the ceiling and your feet are flat on the floor. Your arms should be flat at your   sides, next to your body. Tighten your buttocks muscles and lift your buttocks off the floor until your waist is at almost the same height as your knees. You should feel the muscles working in your buttocks and the back of your thighs. If you do not feel these muscles, slide your feet 1-2 inches (2.5-5 cm) farther away from your buttocks. Hold this position for 3-5 seconds. Slowly lower your hips to the starting position, and allow your buttocks muscles to relax completely. If this exercise is too easy, try doing it with your arms  crossed over your chest. Abdominal crunches Repeat these steps 5-10 times: Lie on your back on a firm bed or the floor with your legs extended. Bend your knees so they are pointing toward the ceiling and your feet are flat on the floor. Cross your arms over your chest. Tip your chin slightly toward your chest without bending your neck. Tighten your abdominal muscles and slowly raise your torso high enough to lift your shoulder blades a tiny bit off the floor. Avoid raising your torso higher than that because it can put too much stress on your lower back and does not help to strengthen your abdominal muscles. Slowly return to your starting position. Back lifts Repeat these steps 5-10 times: Lie on your abdomen (face-down) with your arms at your sides, and rest your forehead on the floor. Tighten the muscles in your legs and your buttocks. Slowly lift your chest off the floor while you keep your hips pressed to the floor. Keep the back of your head in line with the curve in your back. Your eyes should be looking at the floor. Hold this position for 3-5 seconds. Slowly return to your starting position. Contact a health care provider if: Your back pain or discomfort gets much worse when you do an exercise. Your worsening back pain or discomfort does not lessen within 2 hours after you exercise. If you have any of these problems, stop doing these exercises right away. Do not do them again unless your health care provider says that you can. Get help right away if: You develop sudden, severe back pain. If this happens, stop doing the exercises right away. Do not do them again unless your health care provider says that you can. This information is not intended to replace advice given to you by your health care provider. Make sure you discuss any questions you have with your health care provider. Document Revised: 08/25/2020 Document Reviewed: 08/25/2020 Elsevier Patient Education  [place] [facility] Ask your health care provider which exercises are safe for you. Do exercises exactly as told by your health care provider and adjust them as directed. It is normal to feel mild stretching, pulling, tightness, or discomfort as you do these exercises. Stop right away if you feel sudden pain or your pain gets significantly worse. Do not begin these exercises until told by your health care provider. Stretching and range-of-motion exercises These exercises warm up your muscles and joints and improve the movement and flexibility of your hip and pelvis. Quadriceps stretch, prone  Lie on your abdomen (prone position) on a firm surface, such as a bed or padded floor. Bend your left / right knee and reach back to hold your ankle or pant leg. If you cannot reach your ankle or pant leg, loop a belt around your foot and grab the belt instead. Gently pull your heel toward your buttocks. Your knee should not slide out  to the side. You should feel a stretch in the front of your thigh and knee (quadriceps). Hold this position for __________ seconds. Repeat __________ times. Complete this exercise __________ times a day. Iliotibial band stretch An iliotibial band is a strong band of muscle tissue that runs from the outer side of your hip to the outer side of your thigh and knee. Lie on your side with your left / right leg in the top position. Bend both of your knees and grab your left / right ankle. Stretch out your bottom arm to help you balance. Slowly bring your top knee back so your thigh goes behind your trunk. Slowly lower your top leg toward the floor until you feel a gentle stretch on the outside of your left / right hip and thigh. If you do not feel a stretch and your knee will not fall farther, place the heel of your other foot on top of your knee and pull your knee down toward the floor with your foot. Hold this position for __________ seconds. Repeat __________ times.  Complete this exercise __________ times a day. Strengthening exercises These exercises build strength and endurance in your hip and pelvis. Endurance is the ability to use your muscles for a long time, even after they get tired. Straight leg raises, side-lying This exercise strengthens the muscles that rotate the leg at the hip and move it away from your body (hip abductors). Lie on your side with your left / right leg in the top position. Lie so your head, shoulder, hip, and knee line up. You may bend your bottom knee to help you balance. Roll your hips slightly forward so your hips are stacked directly over each other and your left / right knee is facing forward. Tense the muscles in your outer thigh and lift your top leg 4-6 inches (10-15 cm). Hold this position for __________ seconds. Slowly lower your leg to return to the starting position. Let your muscles relax completely before doing another repetition. Repeat __________ times. Complete this exercise __________ times a day. Leg raises, prone This exercise strengthens the muscles that move the hips backward (hip extensors). Lie on your abdomen (prone position) on your bed or a firm surface. You can put a pillow under your hips if that is more comfortable for your lower back. Bend your left / right knee so your foot is straight up in the air. Squeeze your buttocks muscles and lift your left / right thigh off the bed. Do not let your back arch. Tense your thigh muscle as hard as you can without increasing any knee pain. Hold this position for __________ seconds. Slowly lower your leg to return to the starting position and allow it to relax completely. Repeat __________ times. Complete this exercise __________ times a day. Hip hike Stand sideways on a bottom step. Stand on your left / right leg with your other foot unsupported next to the step. You can hold on to a railing or wall for balance if needed. Keep your knees straight and your  torso square. Then lift your left / right hip up toward the ceiling. Slowly let your left / right hip lower toward the floor, past the starting position. Your foot should get closer to the floor. Do not lean or bend your knees. Repeat __________ times. Complete this exercise __________ times a day. This information is not intended to replace advice given to you by your health care provider. Make sure you discuss any questions you  have with your health care provider. Document Revised: 08/20/2019 Document Reviewed: 08/20/2019 Elsevier Patient Education  [place].

## 2021-03-15 ENCOUNTER — Ambulatory Visit
Admission: RE | Admit: 2021-03-15 | Discharge: 2021-03-15 | Disposition: A | Discharge status: Home / Self Care | Payer: Medicare Other | Source: Ambulatory Visit | Attending: Family Medicine | Admitting: Family Medicine

## 2021-03-15 ENCOUNTER — Other Ambulatory Visit: Payer: Self-pay

## 2021-03-15 DIAGNOSIS — Z1231 Encounter for screening mammogram for malignant neoplasm of breast: Secondary | ICD-10-CM | POA: Diagnosis not present

## 2021-03-21 NOTE — Progress Notes (Signed)
Please call patient. Normal mammogram.  Repeat in 1 year.  

## 2021-03-29 ENCOUNTER — Encounter: Payer: Self-pay | Admitting: Family Medicine

## 2021-03-29 ENCOUNTER — Other Ambulatory Visit: Payer: Self-pay

## 2021-03-29 ENCOUNTER — Ambulatory Visit (INDEPENDENT_AMBULATORY_CARE_PROVIDER_SITE_OTHER): Payer: Medicare Other | Admitting: Family Medicine

## 2021-03-29 VITALS — BP 119/62 | HR 73 | Ht 65.0 in | Wt 180.0 lb

## 2021-03-29 DIAGNOSIS — Z8679 Personal history of other diseases of the circulatory system: Secondary | ICD-10-CM

## 2021-03-29 DIAGNOSIS — R0989 Other specified symptoms and signs involving the circulatory and respiratory systems: Secondary | ICD-10-CM | POA: Diagnosis not present

## 2021-03-29 DIAGNOSIS — M48061 Spinal stenosis, lumbar region without neurogenic claudication: Secondary | ICD-10-CM

## 2021-03-29 DIAGNOSIS — E890 Postprocedural hypothyroidism: Secondary | ICD-10-CM

## 2021-03-29 DIAGNOSIS — M6530 Trigger finger, unspecified finger: Secondary | ICD-10-CM | POA: Diagnosis not present

## 2021-03-29 DIAGNOSIS — I10 Essential (primary) hypertension: Secondary | ICD-10-CM | POA: Diagnosis not present

## 2021-03-29 NOTE — Assessment & Plan Note (Signed)
Unfortunately continues to have a globus sensation but I am glad that at least they ruled out any type of major issue with the ENT.  We did discuss that the next option would be to see GI as she just had an endoscopy done about 2 years ago and had a dilatation she was already having some similar symptoms at that time but says that the dilatation really did not help with the chronic throat clearing and dryness that she mostly experiences she does try to keep it more humid in her home to help with that.  Unfortunately I do not have any other more specific remedies for her.

## 2021-03-29 NOTE — Assessment & Plan Note (Signed)
No longer on Eliquis so okay to go back up to a whole tab of the meloxicam.  We will make sure we send over new prescription.

## 2021-03-29 NOTE — Assessment & Plan Note (Signed)
Due to recheck TSH.  She wants to go on another day when she can come in fasting.

## 2021-03-29 NOTE — Assessment & Plan Note (Signed)
Restarting formal physical therapy soon.

## 2021-03-29 NOTE — Progress Notes (Signed)
Established Patient Office Visit  Subjective:  Patient ID: Anne [name], female    DOB: 1950-06-29  Age: 70 y.o. MRN: [id number]  CC:  Chief Complaint  Patient presents with   Hypertension   Hypothyroidism    HPI Anne [name] presents for   Hypertension- Pt denies chest pain, SOB, dizziness, or heart palpitations.  Taking meds as directed w/o problems.  Denies medication side effects.    She wants to go back up omn the mobic for her back. She has seen Rheumatology.    She reports that they did do some x-rays of her hands etc.  They have recommended formal physical therapy for her low back she was initially complaining more of hip pain but they felt like her hip actually looked pretty good.  Hypothyroidism - Taking medication regularly in the AM away from food and vitamins, etc. No recent change to skin, hair, or energy levels.   Past Medical History:  Diagnosis Date   Acid reflux    Aortic atherosclerosis ([place]) 04/28/2013   CT 08/2017   Arthritis    "joints, hands" (08/30/2017)   Chronic lower back pain    Disc herniation    neck and low back   Family history of adverse reaction to anesthesia    "daughter w/PONV"   Follicular cystitis    Hiatal hernia    High cholesterol    Hyperlipidemia    ELEV CHOLESTEROL   Hypothyroidism    Osteopenia 2014   T score -1.6 FRAX 13%/1%   PAF (paroxysmal atrial fibrillation) ([place]) 08/30/2017   Echo 2/19: EF 55-60, normal wall motion, grade 1 diastolic dysfunction, mild PI, trivial TR //  Nuc 3/19: no ischemia or scar, EF 80 // CHADS2-VASc: 3 // Eliquis 5 bid    Schatzki's ring     Past Surgical History:  Procedure Laterality Date   BREAST BIOPSY Bilateral 1997   "excisional bx both benign"   BREAST CYST ASPIRATION Right ~ 1993   CATARACT EXTRACTION W/ INTRAOCULAR LENS IMPLANT Left 2011   CATARACT EXTRACTION W/ INTRAOCULAR LENS IMPLANT Right 12/20/2012   Dr. Haynes Bast, OD   COLONOSCOPY WITH  ESOPHAGOGASTRODUODENOSCOPY (EGD)  "several"   "? dilated esophagus" (08/30/2017)   DILATION AND CURETTAGE OF UTERUS  1996   EYE MUSCLE SURGERY Bilateral 1992   HERNIA REPAIR     HYSTEROSCOPY     LAPAROSCOPIC CHOLECYSTECTOMY  09/28/2011   OOPHORECTOMY Right 04/2000   LAP RSO/LYSIS OF ADHESIONS   THYROIDECTOMY  1976   TUBAL LIGATION  1976   VARICOSE VEIN SURGERY Bilateral 1972   VENTRAL HERNIA REPAIR  06/23/13   Dr. Franz Dell    Family History  Problem Relation Age of Onset   Hypertension Mother    Heart attack Father 54   Heart disease Father    Anuerysm Father    Stroke Sister    Hypertension Sister    Arthritis Sister    Lupus Sister    Cancer Sister    Breast cancer Sister        Age 78   Rheum arthritis Sister    Stroke Sister    Cancer Brother 89       melanoma   Melanoma Brother    Prostate cancer Brother    Leukemia Brother    Pulmonary fibrosis Brother    Arthritis Daughter        Rheumatoid   Hyperparathyroidism Daughter     Social History   Socioeconomic History  Marital status: Married    Spouse name: [name]   Number of children: 1   Years of education: 10   Highest education level: GED or equivalent  Occupational History   Occupation: Teaching laboratory technician    Comment: retired  Tobacco Use   Smoking status: Former    Packs/day: 0.10    Years: 18.00    Pack years: 1.80    Types: Cigarettes    Quit date: 01/27/1979    Years since quitting: 42.1   Smokeless tobacco: Never  Vaping Use   Vaping Use: Never used  Substance and Sexual Activity   Alcohol use: Yes    Alcohol/week: 1.0 standard drink    Types: 1 Glasses of wine per week    Comment: 2 glasses of wine a month   Drug use: No   Sexual activity: Not Currently    Partners: Male    Birth control/protection: Post-menopausal  Other Topics Concern   Not on file  Social History Narrative   4 brothers and 6 sisters. All 4 brother deceased.  2 sisters deceased.  Tries to go the park to walk with  her husband. Likes to [employer] and do crossword puzzles.   Social Determinants of Health   Financial Resource Strain: Low Risk    Difficulty of Paying Living Expenses: Not hard at all  Food Insecurity: No Food Insecurity   Worried About Charity fundraiser in the Last Year: Never true   [place] in the Last Year: Never true  Transportation Needs: No Transportation Needs   Lack of Transportation (Medical): No   Lack of Transportation (Non-Medical): No  Physical Activity: Insufficiently Active   Days of Exercise per Week: 3 days   Minutes of Exercise per Session: 20 min  Stress: No Stress Concern Present   Feeling of Stress : Not at all  Social Connections: Moderately Isolated   Frequency of Communication with Friends and Family: More than three times a week   Frequency of Social Gatherings with Friends and Family: More than three times a week   Attends Religious Services: Never   Marine scientist or Organizations: No   Attends Music therapist: Never   Marital Status: Married  Human resources officer Violence: Not At Risk   Fear of Current or Ex-Partner: No   Emotionally Abused: No   Physically Abused: No   Sexually Abused: No    Outpatient Medications Prior to Visit  Medication Sig Dispense Refill   CALCIUM PO Take 1 tablet by mouth daily.      Cholecalciferol (VITAMIN D) 2000 UNITS tablet Take 2,000 Units by mouth daily.     estradiol (ESTRACE) 0.1 MG/GM vaginal cream Place 8.12 Applicatorfuls vaginally as needed. For vaginal irritation 42.5 g 2   meloxicam (MOBIC) 15 MG tablet Take 1 tablet (15 mg total) by mouth daily. 90 tablet 1   metoprolol succinate (TOPROL-XL) 50 MG 24 hr tablet TAKE 1 AND 1/2 TABS DAILY TO = 75 MG DAILY 135 tablet 3   Multiple Vitamin (MULTIVITAMIN) tablet Take 1 tablet by mouth daily.     omeprazole (PRILOSEC) 40 MG capsule TAKE 1 CAPSULE (40 MG TOTAL) BY MOUTH DAILY. 90 capsule 1   simvastatin (ZOCOR) 40 MG tablet TAKE 1 TABLET BY  MOUTH EVERY DAY 90 tablet 3   SYNTHROID 75 MCG tablet TAKE 1 TABLET (75 MCG TOTAL) BY MOUTH DAILY BEFORE BREAKFAST. 90 tablet 2   No facility-administered medications prior to visit.  Allergies  Allergen Reactions   Amoxicillin-Pot Clavulanate     REACTION: blisters in colon (Augmentin)   Ceftriaxone Sodium     REACTION: throat swells (ROCEPHIN)   Cephalexin    Codeine     REACTION: nausea   Molds & Smuts    Zofran [Ondansetron Hcl]     Headaches    ROS Review of Systems    Objective:    Physical Exam Constitutional:      Appearance: Normal appearance. She is well-developed.  HENT:     Head: Normocephalic and atraumatic.  Cardiovascular:     Rate and Rhythm: Normal rate and regular rhythm.     Heart sounds: Normal heart sounds.  Pulmonary:     Effort: Pulmonary effort is normal.     Breath sounds: Normal breath sounds.  Skin:    General: Skin is warm and dry.  Neurological:     Mental Status: She is alert and oriented to person, place, and time.  Psychiatric:        Behavior: Behavior normal.    BP 119/62   Pulse 73   Ht 5\' 5"  (1.651 m)   Wt 180 lb (81.6 kg)   LMP 09/08/2000   SpO2 100%   BMI 29.95 kg/m  Wt Readings from Last 3 Encounters:  03/29/21 180 lb (81.6 kg)  03/08/21 182 lb 9.6 oz (82.8 kg)  09/27/20 177 lb (80.3 kg)     There are no preventive care reminders to display for this patient.   There are no preventive care reminders to display for this patient.  Lab Results  Component Value Date   TSH 0.85 12/09/2019   Lab Results  Component Value Date   WBC 5.3 09/28/2020   HGB 14.0 09/28/2020   HCT 43.3 09/28/2020   MCV 86.9 09/28/2020   PLT 182 09/28/2020   Lab Results  Component Value Date   NA 139 09/28/2020   K 4.4 09/28/2020   CO2 29 09/28/2020   GLUCOSE 121 (H) 09/28/2020   BUN 17 09/28/2020   CREATININE 0.94 (H) 09/28/2020   BILITOT 0.7 09/28/2020   ALKPHOS 81 08/30/2017   AST 18 09/28/2020   ALT 18 09/28/2020    PROT 6.8 09/28/2020   ALBUMIN 4.2 08/30/2017   CALCIUM 9.5 09/28/2020   ANIONGAP 13 08/30/2017   Lab Results  Component Value Date   CHOL 167 09/28/2020   Lab Results  Component Value Date   HDL 48 (L) 09/28/2020   Lab Results  Component Value Date   LDLCALC 96 09/28/2020   Lab Results  Component Value Date   TRIG 133 09/28/2020   Lab Results  Component Value Date   CHOLHDL 3.5 09/28/2020   Lab Results  Component Value Date   HGBA1C 5.3 02/19/2019      Assessment & Plan:   Problem List Items Addressed This Visit       Cardiovascular and Mediastinum   Essential hypertension - Primary   Relevant Orders   BASIC METABOLIC PANEL WITH GFR     Endocrine   Postsurgical hypothyroidism    Due to recheck TSH.  She wants to go on another day when she can come in fasting.      Relevant Orders   TSH     Other   Spinal stenosis of lumbar region    Restarting formal physical therapy soon.      History of atrial fibrillation    No longer on Eliquis so okay to go back up  to a whole tab of the meloxicam.  We will make sure we send over new prescription.      Globus sensation    Unfortunately continues to have a globus sensation but I am glad that at least they ruled out any type of major issue with the ENT.  We did discuss that the next option would be to see GI as she just had an endoscopy done about 2 years ago and had a dilatation she was already having some similar symptoms at that time but says that the dilatation really did not help with the chronic throat clearing and dryness that she mostly experiences she does try to keep it more humid in her home to help with that.  Unfortunately I do not have any other more specific remedies for her.      Other Visit Diagnoses     Trigger finger, unspecified finger, unspecified laterality           Trigger finger-affects her ring finger on the left and middle finger on the right hand-we did discuss getting in with our  sports med doc for an injection at some point when she would like for symptom relief.  No orders of the defined types were placed in this encounter.   Follow-up: No follow-ups on file.    Beatrice Lecher, MD

## 2021-03-30 ENCOUNTER — Ambulatory Visit (INDEPENDENT_AMBULATORY_CARE_PROVIDER_SITE_OTHER): Payer: Medicare Other | Admitting: Physical Therapy

## 2021-03-30 ENCOUNTER — Encounter: Payer: Self-pay | Admitting: Physical Therapy

## 2021-03-30 DIAGNOSIS — I10 Essential (primary) hypertension: Secondary | ICD-10-CM | POA: Diagnosis not present

## 2021-03-30 DIAGNOSIS — R29818 Other symptoms and signs involving the nervous system: Secondary | ICD-10-CM | POA: Diagnosis not present

## 2021-03-30 DIAGNOSIS — M542 Cervicalgia: Secondary | ICD-10-CM

## 2021-03-30 DIAGNOSIS — E890 Postprocedural hypothyroidism: Secondary | ICD-10-CM | POA: Diagnosis not present

## 2021-03-30 DIAGNOSIS — R293 Abnormal posture: Secondary | ICD-10-CM

## 2021-03-30 NOTE — Therapy (Signed)
[place], [place], [address] Phone: [phone]   Fax:  [phone]  Physical Therapy Evaluation  Patient Details  Name: Anne [name] MRN: [id number] Date of Birth: [birth year] Referring Provider (PT): Bo Merino   Encounter Date: 03/30/2021   PT End of Session - 03/30/21 1014     Visit Number 1    Number of Visits 12    Date for PT Re-Evaluation 05/25/21    Authorization Type medicare    Authorization - Visit Number 1    Progress Note Due on Visit 10    PT Start Time 0935    PT Stop Time 1010    PT Time Calculation (min) 35 min    Activity Tolerance Patient tolerated treatment well    Behavior During Therapy [facility] for tasks assessed/performed             Past Medical History:  Diagnosis Date   Acid reflux    Aortic atherosclerosis ([place]) 04/28/2013   CT 08/2017   Arthritis    "joints, hands" (08/30/2017)   Chronic lower back pain    Disc herniation    neck and low back   Family history of adverse reaction to anesthesia    "daughter w/PONV"   Follicular cystitis    Hiatal hernia    High cholesterol    Hyperlipidemia    ELEV CHOLESTEROL   Hypothyroidism    Osteopenia 2014   T score -1.6 FRAX 13%/1%   PAF (paroxysmal atrial fibrillation) ([place]) 08/30/2017   Echo 2/19: EF 55-60, normal wall motion, grade 1 diastolic dysfunction, mild PI, trivial TR //  Nuc 3/19: no ischemia or scar, EF 80 // CHADS2-VASc: 3 // Eliquis 5 bid    Schatzki's ring     Past Surgical History:  Procedure Laterality Date   BREAST BIOPSY Bilateral 1997   "excisional bx both benign"   BREAST CYST ASPIRATION Right ~ [place] Left 2011   CATARACT EXTRACTION W/ INTRAOCULAR LENS IMPLANT Right 12/20/2012   Dr. Haynes Bast, OD   COLONOSCOPY WITH ESOPHAGOGASTRODUODENOSCOPY (EGD)  "several"   "? dilated esophagus" (08/30/2017)   DILATION AND CURETTAGE OF  UTERUS  1996   EYE MUSCLE SURGERY Bilateral 1992   HERNIA REPAIR     HYSTEROSCOPY     LAPAROSCOPIC CHOLECYSTECTOMY  09/28/2011   OOPHORECTOMY Right 04/2000   LAP RSO/LYSIS OF ADHESIONS   THYROIDECTOMY  1976   TUBAL LIGATION  1976   VARICOSE VEIN SURGERY Bilateral 1972   VENTRAL HERNIA REPAIR  06/23/13   Dr. Franz Dell    There were no vitals filed for this visit.    Subjective Assessment - 03/30/21 0934     Subjective Pt has been having Low back pain going down her Rt LE which has worsened in the past month. Pt saw MD who did x rays of hip and low back which showed stenosis of lumbar spine. MD recommended PT and meloxicam. Pain increases with standing, stair negotiation and prolonged walking, decreases with seated or supine rest.    Pertinent History trigger fingers in bilat hands, lumbar scoliosis    Limitations Standing;Walking    How long can you stand comfortably? 20 minutes    How long can you walk comfortably? 20 minutes    Diagnostic tests x ray shows spinal stenosis and facet arthritis    Patient Stated Goals decrease pain to improve community mobility    Currently in  Pain? Yes    Pain Score 1     Pain Location Hip    Pain Orientation Right    Pain Descriptors / Indicators Pressure    Pain Type Chronic pain    Pain Onset More than a month ago    Pain Frequency Intermittent    Aggravating Factors  prolonged walking, standing    Pain Relieving Factors seated or supine rest                [facility] PT Assessment - 03/30/21 0001       Assessment   Medical Diagnosis lumbar spinal stenosis, Rt sided low back pain with Rt sciatica    Referring Provider (PT) Bo Merino    Onset Date/Surgical Date 02/24/21    Hand Dominance Right    Next MD Visit PRN    Prior Therapy yes for cervical      Precautions   Precautions None      Balance Screen   Has the patient fallen in the past 6 months No    Has the patient had a decrease in activity level because of a  fear of falling?  No    Is the patient reluctant to leave their home because of a fear of falling?  No      Home Environment   Additional Comments 2 steps to enter home, 1 level home      Prior Function   Level of Independence Independent      Observation/Other Assessments   Focus on Therapeutic Outcomes (FOTO)  60      Posture/Postural Control   Posture Comments forward flexed posture in standing      ROM / Strength   AROM / PROM / Strength AROM;Strength      AROM   AROM Assessment Site Lumbar    Lumbar Flexion limited 25%    Lumbar Extension limited 25%    Lumbar - Right Side Bend WFL    Lumbar - Left Side Bend limited 25% - pain    Lumbar - Right Rotation WFL    Lumbar - Left Rotation [facility]      Strength   Strength Assessment Site Hip    Right/Left Hip Right;Left    Right Hip Flexion 4+/5    Right Hip Extension 4/5    Right Hip ABduction 4+/5    Left Hip Flexion 4+/5    Left Hip Extension 4/5    Left Hip ABduction 4+/5      Flexibility   Soft Tissue Assessment /Muscle Length yes    Hamstrings decreased bilat    Quadriceps WFL    Piriformis decreased bilat    Obturator Internus decreased bilat      Palpation   Spinal mobility hypomobile L3-L5    SI assessment  TTP Rt SIJ, hypomobile    Palpation comment TTP with PA mobs L3-5, TTP Rt piriformis and glutes      Special Tests   Other special tests FABER (+) Lt, SLR (-) bilat                        Objective measurements completed on examination: See above findings.       [facility] Adult PT Treatment/Exercise - 03/30/21 0001       Exercises   Exercises Lumbar      Lumbar Exercises: Stretches   Passive Hamstring Stretch Right;Left;2 reps    Passive Hamstring Stretch Limitations seated    Hip Flexor Stretch Right;Left;2  reps    Hip Flexor Stretch Limitations seated    Figure 4 Stretch 2 reps;20 seconds    Figure 4 Stretch Limitations seated      Lumbar Exercises: Supine   Ab Set 5 reps;5  seconds    Clam 10 reps    Clam Limitations red TB    Other Supine Lumbar Exercises ball squeeze 10 x 3 sec                     PT Education - 03/30/21 1007     Education Details PT POC and goals, HEP    Person(s) Educated Patient    Methods Explanation;Demonstration;Handout    Comprehension Returned demonstration;Verbalized understanding                 PT Long Term Goals - 03/30/21 1206       PT LONG TERM GOAL #1   Title Pt will be independent in HEP    Time 6    Period Weeks    Status New    Target Date 05/25/21      PT LONG TERM GOAL #2   Title Pt will improve FOTO to >= 70 to demo improved functional mobility    Time 6    Period Weeks    Status New    Target Date 05/25/21      PT LONG TERM GOAL #3   Title Pt will improve lumbar ROM by 25% in all directions to decrease symptoms with transitions    Time 6    Period Weeks    Status New    Target Date 05/25/21      PT LONG TERM GOAL #4   Title Pt will tolerate standing x 30 minutes with symptoms <= 2/10    Time 6    Period Weeks    Status New    Target Date 05/25/21                    Plan - 03/30/21 1015     Clinical Impression Statement Pt is a 70 y/o female referred for lumbar stenosis who presents with impaired ROM and flexibility, decreased activity tolerance, impaired mobility and decreased core strength. Pt will benefit from skilled PT to address deficits and improve functional mobility    Personal Factors and Comorbidities Comorbidity 2;Age;Time since onset of injury/illness/exacerbation    Examination-Activity Limitations Stand;Stairs;Locomotion Level    Examination-Participation Restrictions Community Activity;Yard Work    Stability/Clinical Decision Making Stable/Uncomplicated    Clinical Decision Making Low    Rehab Potential Good    PT Frequency 2x / week    PT Duration 6 weeks    PT Treatment/Interventions Aquatic Therapy;Cryotherapy;Electrical Stimulation;Moist  Heat;Traction;Iontophoresis 4mg /ml Dexamethasone;Therapeutic activities;Therapeutic exercise;Balance training;Neuromuscular re-education;Stair training;Gait training;Dry needling;Taping;Patient/family education;Manual techniques    PT Next Visit Plan assess HEP, progress core strength and lumbar flexibility    PT Home Exercise Plan [email or web address]    Consulted and Agree with Plan of Care Patient             Patient will benefit from skilled therapeutic intervention in order to improve the following deficits and impairments:  Pain, Impaired flexibility, Decreased activity tolerance, Decreased strength, Decreased mobility, Hypomobility, Increased muscle spasms, Decreased range of motion  Visit Diagnosis: Abnormal posture - Plan: PT plan of care cert/re-cert  Cervicalgia - Plan: PT plan of care cert/re-cert  Other symptoms and signs involving the nervous system - Plan: PT plan of care cert/re-cert  Problem List Patient Active Problem List   Diagnosis Date Noted   Globus sensation 03/29/2021   Dry mouth 03/29/2020   Seborrheic dermatitis of scalp 01/22/2020   Cervical radiculopathy 08/25/2019   History of esophagogastroduodenoscopy (EGD) 02/08/2018   Colon cancer screening 02/08/2018   Essential hypertension 12/25/2017   GERD (gastroesophageal reflux disease) 08/30/2017   Chronic diastolic (congestive) heart failure ([place]) 08/30/2017   History of atrial fibrillation 08/30/2017   Flushing 07/30/2017   Paresthesia of both hands 07/30/2017   Postsurgical hypothyroidism 07/30/2017   Aortic atherosclerosis ([place]) 04/28/2013   S/P cataract extraction 01/16/2013   Pseudophakia of both eyes 12/20/2012   Schatzki's ring 10/16/2012   Spinal stenosis of lumbar region 08/16/2012   Vaginal atrophy 11/14/2011   Graves' disease 05/05/2011   Disc herniation    Osteopenia    PERSONAL HISTORY OF ALLERGY TO LATEX 12/23/2009   UNSPECIFIED DISEASE OF PHARYNX 12/16/2008   FATIGUE 11/10/2008    Intermittent palpitations 11/10/2008   Hypothyroidism 09/22/2008   Hyperlipidemia 09/22/2008   ALLERGIC RHINITIS 09/22/2008    Mylani Gentry, PT 03/30/2021, 12:36 PM  [place] [address] [place], [place], [address] Phone: [phone]   Fax:  [phone]  Name: Tateanna Bach MRN: [id number] Date of Birth: [birth year]

## 2021-03-30 NOTE — Patient Instructions (Signed)
Access Code: [facility] URL: [email or web address] Date: 03/30/2021 Prepared by: Isabelle Course  Exercises Supine Transversus Abdominis Bracing - Hands on Stomach - 1 x daily - 7 x weekly - 2 sets - 10 reps - 5 sec hold Hooklying Clamshell with Resistance - 1 x daily - 7 x weekly - 2 sets - 10 reps Supine Hip Adduction Isometric with Ball - 1 x daily - 7 x weekly - 2 sets - 10 reps - 3 seconds hold Seated Figure 4 Piriformis Stretch - 1 x daily - 7 x weekly - 3 sets - 1 reps - 20-30 sec hold Seated Hamstring Stretch - 1 x daily - 7 x weekly - 3 sets - 1 reps - 20-30 sec hold Seated Hip Flexor Stretch - 1 x daily - 7 x weekly - 3 sets - 1 reps - 20-30 sec hold

## 2021-03-31 LAB — BASIC METABOLIC PANEL WITH GFR
BUN: 14 mg/dL (ref 7–25)
CO2: 28 mmol/L (ref 20–32)
Calcium: 9.4 mg/dL (ref 8.6–10.4)
Chloride: 104 mmol/L (ref 98–110)
Creat: 0.95 mg/dL (ref 0.60–1.00)
Glucose, Bld: 119 mg/dL — ABNORMAL HIGH (ref 65–99)
Potassium: 4.7 mmol/L (ref 3.5–5.3)
Sodium: 139 mmol/L (ref 135–146)
eGFR: 64 mL/min/{1.73_m2} (ref >=60)

## 2021-03-31 LAB — TSH: TSH: 1.79 mIU/L (ref 0.40–4.50)

## 2021-03-31 NOTE — Progress Notes (Signed)
Your lab work is within acceptable range and there are no concerning findings.   ?

## 2021-04-05 ENCOUNTER — Other Ambulatory Visit: Payer: Self-pay

## 2021-04-05 ENCOUNTER — Encounter: Payer: Self-pay | Admitting: Rehabilitative and Restorative Service Providers"

## 2021-04-05 ENCOUNTER — Ambulatory Visit (INDEPENDENT_AMBULATORY_CARE_PROVIDER_SITE_OTHER): Payer: Medicare Other | Admitting: Rehabilitative and Restorative Service Providers"

## 2021-04-05 DIAGNOSIS — R293 Abnormal posture: Secondary | ICD-10-CM

## 2021-04-05 DIAGNOSIS — M5441 Lumbago with sciatica, right side: Secondary | ICD-10-CM | POA: Diagnosis not present

## 2021-04-05 DIAGNOSIS — R29898 Other symptoms and signs involving the musculoskeletal system: Secondary | ICD-10-CM

## 2021-04-05 NOTE — Patient Instructions (Addendum)
Kinesiology tape What is kinesiology tape?  There are many brands of kinesiology tape.  KTape, [employer], [employer], Dynamic tape, to name a few. It is an elasticized tape designed to support the body's natural healing process. This tape provides stability and support to muscles and joints without restricting motion. It can also help decrease swelling in the area of application. How does it work? The tape microscopically lifts and decompresses the skin to allow for drainage of lymph (swelling) to flow away from area, reducing inflammation.  The tape has the ability to help re-educate the neuromuscular system by targeting specific receptors in the skin.  The presence of the tape increases the body's awareness of posture and body mechanics.  Do not use with: Open wounds Skin lesions Adhesive allergies Safe removal of the tape: In some rare cases, mild/moderate skin irritation can occur.  This can include redness, itchiness, or hives. If this occurs, immediately remove tape and consult your primary care physician if symptoms are severe or do not resolve within 2 days.  To remove tape safely, hold nearby skin with one hand and gentle roll tape down with other hand.  You can apply oil or conditioner to tape while in shower prior to removal to loosen adhesive.  DO NOT swiftly rip tape off like a band-aid, as this could cause skin tears and additional skin irritation.   Kinesiology tape What is kinesiology tape?  There are many brands of kinesiology tape.  KTape, [employer], [employer], Dynamic tape, to name a few. It is an elasticized tape designed to support the body's natural healing process. This tape provides stability and support to muscles and joints without restricting motion. It can also help decrease swelling in the area of application. How does it work? The tape microscopically lifts and decompresses the skin to allow for drainage of lymph (swelling) to flow away from area, reducing  inflammation.  The tape has the ability to help re-educate the neuromuscular system by targeting specific receptors in the skin.  The presence of the tape increases the body's awareness of posture and body mechanics.  Do not use with: Open wounds Skin lesions Adhesive allergies Safe removal of the tape: In some rare cases, mild/moderate skin irritation can occur.  This can include redness, itchiness, or hives. If this occurs, immediately remove tape and consult your primary care physician if symptoms are severe or do not resolve within 2 days.  To remove tape safely, hold nearby skin with one hand and gentle roll tape down with other hand.  You can apply oil or conditioner to tape while in shower prior to removal to loosen adhesive.  DO NOT swiftly rip tape off like a band-aid, as this could cause skin tears and additional skin irritation.      Sleeping on Back  Place pillow under knees. A pillow with cervical support and a roll around waist are also helpful. Copyright  [employer]. All rights reserved.  Sleeping on Side Place pillow between knees. Use cervical support under neck and a roll around waist as needed. Copyright  [employer]. All rights reserved.   Sleeping on Stomach   If this is the only desirable sleeping position, place pillow under lower legs, and under stomach or chest as needed.  Posture - Sitting   Sit upright, head facing forward. Try using a roll to support lower back. Keep shoulders relaxed, and avoid rounded back. Keep hips level with knees. Avoid crossing legs for long periods. Stand to Sit /  Sit to Stand   To sit: Bend knees to lower self onto front edge of chair, then scoot back on seat. To stand: Reverse sequence by placing one foot forward, and scoot to front of seat. Use rocking motion to stand up.   Work Height and Reach  Ideal work height is no more than 2 to 4 inches below elbow level when standing, and at elbow level when sitting. Reaching should be limited to  arm's length, with elbows slightly bent.  Bending  Bend at hips and knees, not back. Keep feet shoulder-width apart.    Posture - Standing   Good posture is important. Avoid slouching and forward head thrust. Maintain curve in low back and align ears over shoul- ders, hips over ankles.  Alternating Positions   Alternate tasks and change positions frequently to reduce fatigue and muscle tension. Take rest breaks. Computer Work   Position work to Programmer, multimedia. Use proper work and seat height. Keep shoulders back and down, wrists straight, and elbows at right angles. Use chair that provides full back support. Add footrest and lumbar roll as needed.  Getting Into / Out of Car  Lower self onto seat, scoot back, then bring in one leg at a time. Reverse sequence to get out.  Dressing  Lie on back to pull socks or slacks over feet, or sit and bend leg while keeping back straight.    Housework - Sink  Place one foot on ledge of cabinet under sink when standing at sink for prolonged periods.   Pushing / Pulling  Pushing is preferable to pulling. Keep back in proper alignment, and use leg muscles to do the work.  Deep Squat   Squat and lift with both arms held against upper trunk. Tighten stomach muscles without holding breath. Use smooth movements to avoid jerking.  Avoid Twisting   Avoid twisting or bending back. Pivot around using foot movements, and bend at knees if needed when reaching for articles.  Carrying Luggage   Distribute weight evenly on both sides. Use a cart whenever possible. Do not twist trunk. Move body as a unit.   Lifting Principles Maintain proper posture and head alignment. Slide object as close as possible before lifting. Move obstacles out of the way. Test before lifting; ask for help if too heavy. Tighten stomach muscles without holding breath. Use smooth movements; do not jerk. Use legs to do the work, and pivot with feet. Distribute the  work load symmetrically and close to the center of trunk. Push instead of pull whenever possible.   Ask For Help   Ask for help and delegate to others when possible. Coordinate your movements when lifting together, and maintain the low back curve.  Log Roll   Lying on back, bend left knee and place left arm across chest. Roll all in one movement to the right. Reverse to roll to the left. Always move as one unit. Housework - Sweeping  Use long-handled equipment to avoid stooping.   Housework - Wiping  Position yourself as close as possible to reach work surface. Avoid straining your back.  Laundry - Unloading Wash   To unload small items at bottom of washer, lift leg opposite to arm being used to reach.  [place]ilder close to area to be raked. Use arm movements to do the work. Keep back straight and avoid twisting.     Cart  When reaching into cart with one arm, lift opposite leg to keep back  straight.   Getting Into / Out of Bed  Lower self to lie down on one side by raising legs and lowering head at the same time. Use arms to assist moving without twisting. Bend both knees to roll onto back if desired. To sit up, start from lying on side, and use same move-ments in reverse. Housework - Vacuuming  Hold the vacuum with arm held at side. Step back and forth to move it, keeping head up. Avoid twisting.   Laundry - IT consultant so that bending and twisting can be avoided.   Laundry - Unloading Dryer  Squat down to reach into clothes dryer or use a reacher.  Gardening - Weeding / Probation officer or Kneel. Knee pads may be helpful.                   Access Code: [facility] URL: [email or web address] Date: 04/05/2021 Prepared by: Gillermo Murdoch  Exercises Supine Transversus Abdominis Bracing - Hands on Stomach - 1 x daily - 7 x weekly - 2 sets - 10 reps - 5 sec hold Hooklying Clamshell with Resistance -  1 x daily - 7 x weekly - 2 sets - 10 reps Supine Hip Adduction Isometric with Ball - 1 x daily - 7 x weekly - 2 sets - 10 reps - 3 seconds hold Seated Figure 4 Piriformis Stretch - 1 x daily - 7 x weekly - 3 sets - 1 reps - 20-30 sec hold Seated Hamstring Stretch - 1 x daily - 7 x weekly - 3 sets - 1 reps - 20-30 sec hold Seated Hip Flexor Stretch - 1 x daily - 7 x weekly - 3 sets - 1 reps - 20-30 sec hold Hooklying Hamstring Stretch with Strap - 2 x daily - 7 x weekly - 1 sets - 3 reps - 30 sec hold Supine ITB Stretch with Strap - 2 x daily - 7 x weekly - 1 sets - 3 reps - 30 sec hold Supine Piriformis Stretch with Leg Straight - 2 x daily - 7 x weekly - 1 sets - 3 reps - 30 sec hold Single Leg Stance with Support - 2 x daily - 7 x weekly - 1 sets - 3 reps - 10-20 sec hold Wall Quarter Squat - 2 x daily - 7 x weekly - 1-2 sets - 10 reps - 5-10 sec hold          Last Modified by Everardo All, PT on 04/05/2021 at 11:01 AM

## 2021-04-05 NOTE — Therapy (Signed)
[place], [place], [address] Phone: [phone]   Fax:  [phone]  Physical Therapy Treatment  Patient Details  Name: Anne [name] MRN: [id number] Date of Birth: [birth year] Referring Provider (PT): Bo Merino   Encounter Date: 04/05/2021   PT End of Session - 04/05/21 1303     Visit Number 2    Number of Visits 12    Date for PT Re-Evaluation 05/25/21    Authorization Type medicare    Authorization - Visit Number 2    Progress Note Due on Visit 10    PT Start Time 6010    PT Stop Time 1100    PT Time Calculation (min) 45 min    Activity Tolerance Patient tolerated treatment well             Past Medical History:  Diagnosis Date   Acid reflux    Aortic atherosclerosis ([place]) 04/28/2013   CT 08/2017   Arthritis    "joints, hands" (08/30/2017)   Chronic lower back pain    Disc herniation    neck and low back   Family history of adverse reaction to anesthesia    "daughter w/PONV"   Follicular cystitis    Hiatal hernia    High cholesterol    Hyperlipidemia    ELEV CHOLESTEROL   Hypothyroidism    Osteopenia 2014   T score -1.6 FRAX 13%/1%   PAF (paroxysmal atrial fibrillation) ([place]) 08/30/2017   Echo 2/19: EF 55-60, normal wall motion, grade 1 diastolic dysfunction, mild PI, trivial TR //  Nuc 3/19: no ischemia or scar, EF 80 // CHADS2-VASc: 3 // Eliquis 5 bid    Schatzki's ring     Past Surgical History:  Procedure Laterality Date   BREAST BIOPSY Bilateral 1997   "excisional bx both benign"   BREAST CYST ASPIRATION Right ~ [place] Left 2011   CATARACT EXTRACTION W/ INTRAOCULAR LENS IMPLANT Right 12/20/2012   Dr. Haynes Bast, OD   COLONOSCOPY WITH ESOPHAGOGASTRODUODENOSCOPY (EGD)  "several"   "? dilated esophagus" (08/30/2017)   DILATION AND CURETTAGE OF UTERUS  1996   EYE MUSCLE SURGERY Bilateral 1992   HERNIA  REPAIR     HYSTEROSCOPY     LAPAROSCOPIC CHOLECYSTECTOMY  09/28/2011   OOPHORECTOMY Right 04/2000   LAP RSO/LYSIS OF ADHESIONS   THYROIDECTOMY  1976   TUBAL LIGATION  1976   VARICOSE VEIN SURGERY Bilateral 1972   VENTRAL HERNIA REPAIR  06/23/13   Dr. Franz Dell    There were no vitals filed for this visit.   Subjective Assessment - 04/05/21 1018     Subjective Pain is not radiating down her legs today. Still has the nagging reminder of pain in the LB. Did OK with her exercises but had difficulty getting in and out of the floor. Patient reports some discomfort in Lt knee with Nustep - history of Lt knee pain for the past year. She noticed difficulty getting out of the floor after praying and the knee would hurt. She now steps up steps with Rt foot and then brings the Lt up.    Currently in Pain? Yes    Pain Score 4     Pain Location Back    Pain Orientation Right;Left;Mid;Lower    Pain Descriptors / Indicators Nagging;Aching    Pain Type Chronic pain    Pain Onset More than a month ago    Pain Frequency  Intermittent                [facility] PT Assessment - 04/05/21 0001       Assessment   Medical Diagnosis lumbar spinal stenosis, Rt sided low back pain with Rt sciatica    Referring Provider (PT) Bo Merino    Onset Date/Surgical Date 02/24/21    Hand Dominance Right    Next MD Visit PRN    Prior Therapy yes for cervical      AROM   Lumbar Flexion limited 25%    Lumbar Extension limited 25%      Palpation   Patella mobility decreased patellar mobility lateral to medial Lt knee - palpable tightness lateral quad at insertion to superior patella                           [facility] Adult PT Treatment/Exercise - 04/05/21 0001       Lumbar Exercises: Stretches   Passive Hamstring Stretch Right;Left;3 reps;30 seconds    Passive Hamstring Stretch Limitations supine with strap    Hip Flexor Stretch Right;Left;2 reps;30 seconds    Hip Flexor Stretch  Limitations seated    ITB Stretch Right;Left;2 reps;30 seconds    ITB Stretch Limitations supine with strap    Piriformis Stretch Right;Left;3 reps;30 seconds    Piriformis Stretch Limitations supine travell      Lumbar Exercises: Aerobic   Nustep L5 x 5 min      Lumbar Exercises: Standing   Wall Slides 5 reps;5 seconds   VC to engage core   Wall Slides Limitations small range    Other Standing Lumbar Exercises SLS 10 sec x 2 reps ea side      Lumbar Exercises: Supine   Ab Set 5 reps;5 seconds      Manual Therapy   Kinesiotex Inhibit Muscle      Kinesiotix   Inhibit Muscle  lateral patella correction with K lateral knee second strip lateral to medial pull across patella                     PT Education - 04/05/21 1059     Education Details HEP posture and body mechanics; kinesotaping    Person(s) Educated Patient    Methods Explanation;Demonstration;Tactile cues;Verbal cues;Handout    Comprehension Verbalized understanding;Returned demonstration;Verbal cues required;Tactile cues required                 PT Long Term Goals - 03/30/21 1206       PT LONG TERM GOAL #1   Title Pt will be independent in HEP    Time 6    Period Weeks    Status New    Target Date 05/25/21      PT LONG TERM GOAL #2   Title Pt will improve FOTO to >= 70 to demo improved functional mobility    Time 6    Period Weeks    Status New    Target Date 05/25/21      PT LONG TERM GOAL #3   Title Pt will improve lumbar ROM by 25% in all directions to decrease symptoms with transitions    Time 6    Period Weeks    Status New    Target Date 05/25/21      PT LONG TERM GOAL #4   Title Pt will tolerate standing x 30 minutes with symptoms <= 2/10    Time 6  Period Weeks    Status New    Target Date 05/25/21                   Plan - 04/05/21 1020     Clinical Impression Statement Patient returns reporting decreased Rt LE radicular pain. She has persistent nagging  in the LB. Weakness in the Lt LE patient reporting that she steps up with Rt LE then brings Lt up when ascending steps. Contiinued with strengthening and stabilization; back education. Trial of kinesotaping Lt knee.    Rehab Potential Good    PT Frequency 2x / week    PT Duration 6 weeks    PT Treatment/Interventions Aquatic Therapy;Cryotherapy;Electrical Stimulation;Moist Heat;Traction;Iontophoresis 4mg /ml Dexamethasone;Therapeutic activities;Therapeutic exercise;Balance training;Neuromuscular re-education;Stair training;Gait training;Dry needling;Taping;Patient/family education;Manual techniques    PT Next Visit Plan progress HEP, progress core strength and lumbar flexibility; assess response to taping; additional exercises    PT Home Exercise Plan [email or web address]    Consulted and Agree with Plan of Care Patient             Patient will benefit from skilled therapeutic intervention in order to improve the following deficits and impairments:     Visit Diagnosis: Abnormal posture  Acute bilateral low back pain with right-sided sciatica  Other symptoms and signs involving the musculoskeletal system     Problem List Patient Active Problem List   Diagnosis Date Noted   Globus sensation 03/29/2021   Dry mouth 03/29/2020   Seborrheic dermatitis of scalp 01/22/2020   Cervical radiculopathy 08/25/2019   History of esophagogastroduodenoscopy (EGD) 02/08/2018   Colon cancer screening 02/08/2018   Essential hypertension 12/25/2017   GERD (gastroesophageal reflux disease) 08/30/2017   Chronic diastolic (congestive) heart failure ([place]) 08/30/2017   History of atrial fibrillation 08/30/2017   Flushing 07/30/2017   Paresthesia of both hands 07/30/2017   Postsurgical hypothyroidism 07/30/2017   Aortic atherosclerosis ([place]) 04/28/2013   S/P cataract extraction 01/16/2013   Pseudophakia of both eyes 12/20/2012   Schatzki's ring 10/16/2012   Spinal stenosis of lumbar region 08/16/2012    Vaginal atrophy 11/14/2011   Graves' disease 05/05/2011   Disc herniation    Osteopenia    PERSONAL HISTORY OF ALLERGY TO LATEX 12/23/2009   UNSPECIFIED DISEASE OF PHARYNX 12/16/2008   FATIGUE 11/10/2008   Intermittent palpitations 11/10/2008   Hypothyroidism 09/22/2008   Hyperlipidemia 09/22/2008   ALLERGIC RHINITIS 09/22/2008    Lucely Leard Nilda Simmer, PT, MPH  04/05/2021, 1:03 PM  [facility] [name] [address] [place], [place], [address] Phone: [phone]   Fax:  [phone]  Name: Anne [name] MRN: [id number] Date of Birth: Feb 03, 1951

## 2021-04-07 ENCOUNTER — Other Ambulatory Visit: Payer: Self-pay

## 2021-04-07 ENCOUNTER — Ambulatory Visit (INDEPENDENT_AMBULATORY_CARE_PROVIDER_SITE_OTHER): Payer: Medicare Other | Admitting: Rehabilitative and Restorative Service Providers"

## 2021-04-07 ENCOUNTER — Encounter: Payer: Self-pay | Admitting: Rehabilitative and Restorative Service Providers"

## 2021-04-07 DIAGNOSIS — R293 Abnormal posture: Secondary | ICD-10-CM | POA: Diagnosis not present

## 2021-04-07 DIAGNOSIS — M5441 Lumbago with sciatica, right side: Secondary | ICD-10-CM

## 2021-04-07 DIAGNOSIS — R29898 Other symptoms and signs involving the musculoskeletal system: Secondary | ICD-10-CM | POA: Diagnosis not present

## 2021-04-07 NOTE — Patient Instructions (Signed)
Access Code: [facility] URL: [email or web address] Date: 04/07/2021 Prepared by: Gillermo Murdoch  Exercises Supine Transversus Abdominis Bracing - Hands on Stomach - 1 x daily - 7 x weekly - 2 sets - 10 reps - 5 sec hold Hooklying Clamshell with Resistance - 1 x daily - 7 x weekly - 2 sets - 10 reps Supine Hip Adduction Isometric with Ball - 1 x daily - 7 x weekly - 2 sets - 10 reps - 3 seconds hold Seated Figure 4 Piriformis Stretch - 1 x daily - 7 x weekly - 3 sets - 1 reps - 20-30 sec hold Seated Hamstring Stretch - 1 x daily - 7 x weekly - 3 sets - 1 reps - 20-30 sec hold Seated Hip Flexor Stretch - 1 x daily - 7 x weekly - 3 sets - 1 reps - 20-30 sec hold Hooklying Hamstring Stretch with Strap - 2 x daily - 7 x weekly - 1 sets - 3 reps - 30 sec hold Supine ITB Stretch with Strap - 2 x daily - 7 x weekly - 1 sets - 3 reps - 30 sec hold Supine Piriformis Stretch with Leg Straight - 2 x daily - 7 x weekly - 1 sets - 3 reps - 30 sec hold Single Leg Stance with Support - 2 x daily - 7 x weekly - 1 sets - 3 reps - 10-20 sec hold Wall Quarter Squat - 2 x daily - 7 x weekly - 1-2 sets - 10 reps - 5-10 sec hold Shoulder External Rotation and Scapular Retraction with Resistance - 2 x daily - 7 x weekly - 3 sets - 10 reps Scapular Retraction with Resistance - 2 x daily - 7 x weekly - 3 sets - 10 reps Scapular Retraction with Resistance Advanced - 2 x daily - 7 x weekly - 3 sets - 10 reps Anti-Rotation Lateral Stepping with Press - 2 x daily - 7 x weekly - 1-2 sets - 10 reps - 2-3 sec hold

## 2021-04-07 NOTE — Therapy (Signed)
[place], [place], [address] Phone: [phone]   Fax:  [phone]  Physical Therapy Treatment  Patient Details  Name: Anne [name] MRN: [id number] Date of Birth: 11/24/1950 Referring Provider (PT): Bo Merino   Encounter Date: 04/07/2021   PT End of Session - 04/07/21 1018     Visit Number 3    Number of Visits 12    Date for PT Re-Evaluation 05/25/21    Authorization Type medicare    Authorization - Visit Number 3    Progress Note Due on Visit 10    PT Start Time 0932    PT Stop Time 1100    PT Time Calculation (min) 45 min    Activity Tolerance Patient tolerated treatment well             Past Medical History:  Diagnosis Date   Acid reflux    Aortic atherosclerosis ([place]) 04/28/2013   CT 08/2017   Arthritis    "joints, hands" (08/30/2017)   Chronic lower back pain    Disc herniation    neck and low back   Family history of adverse reaction to anesthesia    "daughter w/PONV"   Follicular cystitis    Hiatal hernia    High cholesterol    Hyperlipidemia    ELEV CHOLESTEROL   Hypothyroidism    Osteopenia 2014   T score -1.6 FRAX 13%/1%   PAF (paroxysmal atrial fibrillation) ([place]) 08/30/2017   Echo 2/19: EF 55-60, normal wall motion, grade 1 diastolic dysfunction, mild PI, trivial TR //  Nuc 3/19: no ischemia or scar, EF 80 // CHADS2-VASc: 3 // Eliquis 5 bid    Schatzki's ring     Past Surgical History:  Procedure Laterality Date   BREAST BIOPSY Bilateral 1997   "excisional bx both benign"   BREAST CYST ASPIRATION Right ~ [place] Left 2011   CATARACT EXTRACTION W/ INTRAOCULAR LENS IMPLANT Right 12/20/2012   Dr. Haynes Bast, OD   COLONOSCOPY WITH ESOPHAGOGASTRODUODENOSCOPY (EGD)  "several"   "? dilated esophagus" (08/30/2017)   DILATION AND CURETTAGE OF UTERUS  1996   EYE MUSCLE SURGERY Bilateral 1992   HERNIA  REPAIR     HYSTEROSCOPY     LAPAROSCOPIC CHOLECYSTECTOMY  09/28/2011   OOPHORECTOMY Right 04/2000   LAP RSO/LYSIS OF ADHESIONS   THYROIDECTOMY  1976   TUBAL LIGATION  1976   VARICOSE VEIN SURGERY Bilateral 1972   VENTRAL HERNIA REPAIR  06/23/13   Dr. Franz Dell    There were no vitals filed for this visit.   Subjective Assessment - 04/07/21 1018     Subjective Patient reports that the tape on her knee seems to irritate the Lt knee. She took it off that evening. She has experienced LBP when standing at the sink yesterday and noticed pain into the legs. Patient is now doing exercises on the bed instead of trying to get in and out of the floor and that is better. LBP comes and goes through the day. Last had pain all day was a week and a half.    Currently in Pain? Yes    Pain Score 3     Pain Location Back    Pain Orientation Right;Left;Lower    Pain Descriptors / Indicators Aching   twinge   Pain Type Chronic pain    Pain Onset More than a month ago    Pain Frequency Intermittent  [place] Adult PT Treatment/Exercise - 04/07/21 0001       Lumbar Exercises: Stretches   Passive Hamstring Stretch Right;Left;3 reps;30 seconds    Passive Hamstring Stretch Limitations supine with strap    Hip Flexor Stretch Right;Left;2 reps;30 seconds    Hip Flexor Stretch Limitations seated    ITB Stretch Right;Left;2 reps;30 seconds    ITB Stretch Limitations supine with strap    Piriformis Stretch Right;Left;3 reps;30 seconds    Piriformis Stretch Limitations supine travell      Lumbar Exercises: Aerobic   Nustep L5 x 5 min      Lumbar Exercises: Standing   Wall Slides 5 reps;5 seconds   VC to engage core   Wall Slides Limitations small range due to Lt knee pain    Scapular Retraction Strengthening;Both;10 reps;Theraband    Theraband Level (Scapular Retraction) Level 2 (Red)    Row Strengthening;Both;10 reps;Theraband    Theraband Level  (Row) Level 3 (Green)    Shoulder Extension Strengthening;Both;10 reps;Theraband    Theraband Level (Shoulder Extension) Level 3 (Green)    Other Standing Lumbar Exercises SLS 10 sec x 2 reps ea side    Other Standing Lumbar Exercises antirotation green x 5 reps each side 3 sec hold      Lumbar Exercises: Supine   Ab Set 5 reps;5 seconds    Clam 10 reps    Clam Limitations red TB                     PT Education - 04/07/21 1048     Education Details HEP    Person(s) Educated Patient    Methods Explanation;Demonstration;Tactile cues;Verbal cues;Handout    Comprehension Verbalized understanding;Returned demonstration;Verbal cues required;Tactile cues required                 PT Long Term Goals - 03/30/21 1206       PT LONG TERM GOAL #1   Title Pt will be independent in HEP    Time 6    Period Weeks    Status New    Target Date 05/25/21      PT LONG TERM GOAL #2   Title Pt will improve FOTO to >= 70 to demo improved functional mobility    Time 6    Period Weeks    Status New    Target Date 05/25/21      PT LONG TERM GOAL #3   Title Pt will improve lumbar ROM by 25% in all directions to decrease symptoms with transitions    Time 6    Period Weeks    Status New    Target Date 05/25/21      PT LONG TERM GOAL #4   Title Pt will tolerate standing x 30 minutes with symptoms <= 2/10    Time 6    Period Weeks    Status New    Target Date 05/25/21                   Plan - 04/07/21 1022     Clinical Impression Statement Intermittent LBP and Rt > Lt LE radicular pain. Has TENS unit for home. Applied and instructed in use and are of TENS unit for home. Added core strengthening in standing using UE exercises    Rehab Potential Good    PT Frequency 2x / week    PT Duration 6 weeks    PT Treatment/Interventions Aquatic Therapy;Cryotherapy;Electrical Stimulation;Moist Heat;Traction;Iontophoresis 4mg /ml Dexamethasone;Therapeutic  activities;Therapeutic exercise;Balance  training;Neuromuscular re-education;Stair training;Gait training;Dry needling;Taping;Patient/family education;Manual techniques    PT Next Visit Plan progress HEP, progress core strength and lumbar flexibility; hold taping; progress core strengthening exercises avoiding irritation of the Lt knee    PT Home Exercise Plan [email or web address]    Consulted and Agree with Plan of Care Patient             Patient will benefit from skilled therapeutic intervention in order to improve the following deficits and impairments:     Visit Diagnosis: Abnormal posture  Acute bilateral low back pain with right-sided sciatica  Other symptoms and signs involving the musculoskeletal system     Problem List Patient Active Problem List   Diagnosis Date Noted   Globus sensation 03/29/2021   Dry mouth 03/29/2020   Seborrheic dermatitis of scalp 01/22/2020   Cervical radiculopathy 08/25/2019   History of esophagogastroduodenoscopy (EGD) 02/08/2018   Colon cancer screening 02/08/2018   Essential hypertension 12/25/2017   GERD (gastroesophageal reflux disease) 08/30/2017   Chronic diastolic (congestive) heart failure ([place]) 08/30/2017   History of atrial fibrillation 08/30/2017   Flushing 07/30/2017   Paresthesia of both hands 07/30/2017   Postsurgical hypothyroidism 07/30/2017   Aortic atherosclerosis ([place]) 04/28/2013   S/P cataract extraction 01/16/2013   Pseudophakia of both eyes 12/20/2012   Schatzki's ring 10/16/2012   Spinal stenosis of lumbar region 08/16/2012   Vaginal atrophy 11/14/2011   Graves' disease 05/05/2011   Disc herniation    Osteopenia    PERSONAL HISTORY OF ALLERGY TO LATEX 12/23/2009   UNSPECIFIED DISEASE OF PHARYNX 12/16/2008   FATIGUE 11/10/2008   Intermittent palpitations 11/10/2008   Hypothyroidism 09/22/2008   Hyperlipidemia 09/22/2008   ALLERGIC RHINITIS 09/22/2008    Brycen Bean Nilda Simmer, PT, MPH  04/07/2021, 11:08 AM  [facility] [address] [place], [place], [address] Phone: [phone]   Fax:  [phone]  Name: Anne [name] MRN: [id number] Date of Birth: Jul 24, 1950

## 2021-04-12 ENCOUNTER — Encounter: Payer: Self-pay | Admitting: Rehabilitative and Restorative Service Providers"

## 2021-04-12 ENCOUNTER — Other Ambulatory Visit: Payer: Self-pay

## 2021-04-12 ENCOUNTER — Ambulatory Visit (INDEPENDENT_AMBULATORY_CARE_PROVIDER_SITE_OTHER): Payer: Medicare Other | Admitting: Rehabilitative and Restorative Service Providers"

## 2021-04-12 DIAGNOSIS — R29898 Other symptoms and signs involving the musculoskeletal system: Secondary | ICD-10-CM

## 2021-04-12 DIAGNOSIS — M5441 Lumbago with sciatica, right side: Secondary | ICD-10-CM | POA: Diagnosis not present

## 2021-04-12 DIAGNOSIS — R293 Abnormal posture: Secondary | ICD-10-CM

## 2021-04-12 NOTE — Therapy (Signed)
[place], [place], [address] Phone: [phone]   Fax:  [phone]  Physical Therapy Treatment  Patient Details  Name: Anne [name] MRN: [id number] Date of Birth: 26-Mar-1951 Referring Provider (PT): Bo Merino   Encounter Date: 04/12/2021   PT End of Session - 04/12/21 1021     Visit Number 4    Number of Visits 12    Date for PT Re-Evaluation 05/25/21    Authorization Type medicare    Authorization - Visit Number 4    Progress Note Due on Visit 10    PT Start Time 2707    PT Stop Time 1100    PT Time Calculation (min) 45 min    Activity Tolerance Patient tolerated treatment well             Past Medical History:  Diagnosis Date   Acid reflux    Aortic atherosclerosis ([place]) 04/28/2013   CT 08/2017   Arthritis    "joints, hands" (08/30/2017)   Chronic lower back pain    Disc herniation    neck and low back   Family history of adverse reaction to anesthesia    "daughter w/PONV"   Follicular cystitis    Hiatal hernia    High cholesterol    Hyperlipidemia    ELEV CHOLESTEROL   Hypothyroidism    Osteopenia 2014   T score -1.6 FRAX 13%/1%   PAF (paroxysmal atrial fibrillation) ([place]) 08/30/2017   Echo 2/19: EF 55-60, normal wall motion, grade 1 diastolic dysfunction, mild PI, trivial TR //  Nuc 3/19: no ischemia or scar, EF 80 // CHADS2-VASc: 3 // Eliquis 5 bid    Schatzki's ring     Past Surgical History:  Procedure Laterality Date   BREAST BIOPSY Bilateral 1997   "excisional bx both benign"   BREAST CYST ASPIRATION Right ~ [place] Left 2011   CATARACT EXTRACTION W/ INTRAOCULAR LENS IMPLANT Right 12/20/2012   Dr. Haynes Bast, OD   COLONOSCOPY WITH ESOPHAGOGASTRODUODENOSCOPY (EGD)  "several"   "? dilated esophagus" (08/30/2017)   DILATION AND CURETTAGE OF UTERUS  1996   EYE MUSCLE SURGERY Bilateral 1992   HERNIA  REPAIR     HYSTEROSCOPY     LAPAROSCOPIC CHOLECYSTECTOMY  09/28/2011   OOPHORECTOMY Right 04/2000   LAP RSO/LYSIS OF ADHESIONS   THYROIDECTOMY  1976   TUBAL LIGATION  1976   VARICOSE VEIN SURGERY Bilateral 1972   VENTRAL HERNIA REPAIR  06/23/13   Dr. Franz Dell    There were no vitals filed for this visit.   Subjective Assessment - 04/12/21 1022     Subjective Patient reports that she was doing better this past week until yesterday. She rode with her husband on his truck Sunday and that may have irritated things. 7/10 yesterday. Some better today.    Currently in Pain? Yes    Pain Score 4     Pain Location Back    Pain Orientation Right;Left;Lower    Pain Descriptors / Indicators Aching    Pain Type Chronic pain    Pain Onset More than a month ago    Pain Frequency Intermittent    Aggravating Factors  prolonged walking; standing    Pain Relieving Factors seated; supine; rest; TENS                [facility] PT Assessment - 04/12/21 0001       Assessment  Medical Diagnosis lumbar spinal stenosis, Rt sided low back pain with Rt sciatica    Referring Provider (PT) Estanislado Pandy, Abel Presto    Onset Date/Surgical Date 02/24/21    Hand Dominance Right    Next MD Visit PRN    Prior Therapy yes for cervical      Strength   Right Hip Flexion 5/5    Right Hip Extension 4+/5    Right Hip ABduction 5/5    Left Hip Flexion 5/5    Left Hip Extension 4+/5    Left Hip ABduction 5/5      Flexibility   Hamstrings tight bilat    Piriformis tight bilat    Obturator Internus tight bilat      Palpation   Spinal mobility hypomobile L3-L5    SI assessment  tight Lt sacral border    Palpation comment muscular tightness lumbar paraspinals; QL; piriformis Lt > Rt                           [facility] Adult PT Treatment/Exercise - 04/12/21 0001       Lumbar Exercises: Stretches   Passive Hamstring Stretch Right;Left;3 reps;30 seconds    Passive Hamstring Stretch Limitations  supine with strap    Hip Flexor Stretch Right;Left;2 reps;30 seconds    Hip Flexor Stretch Limitations seated    ITB Stretch Right;Left;2 reps;30 seconds    ITB Stretch Limitations supine with strap    Piriformis Stretch Right;Left;3 reps;30 seconds    Piriformis Stretch Limitations supine travell      Lumbar Exercises: Aerobic   Nustep L6.5 x 5 min      Lumbar Exercises: Standing   Side Lunge Limitations hip abduction; hip extension knee straight core engaged x 10 each LE    Scapular Retraction Strengthening;Both;10 reps;Theraband    Theraband Level (Scapular Retraction) Level 2 (Red)    Row Strengthening;Both;10 reps;Theraband    Theraband Level (Row) Level 3 (Green)    Shoulder Extension Strengthening;Both;10 reps;Theraband    Theraband Level (Shoulder Extension) Level 3 (Green)    Other Standing Lumbar Exercises SLS 10 sec x 2 reps ea side    Other Standing Lumbar Exercises antirotation green x 10 reps each side 3 sec hold      Lumbar Exercises: Supine   Ab Set 10 reps    Clam 15 reps   holding one LE still moving opposite   Clam Limitations green TB      Manual Therapy   Manual therapy comments myofacial release through the sacrum and Lt lateral sacral border    Soft tissue mobilization lumbar musculature and Lt posterior hip                     PT Education - 04/12/21 1053     Education Details DN HEP    Person(s) Educated Patient    Methods Explanation;Demonstration;Tactile cues;Verbal cues;Handout    Comprehension Verbalized understanding;Returned demonstration;Verbal cues required;Tactile cues required                 PT Long Term Goals - 03/30/21 1206       PT LONG TERM GOAL #1   Title Pt will be independent in HEP    Time 6    Period Weeks    Status New    Target Date 05/25/21      PT LONG TERM GOAL #2   Title Pt will improve FOTO to >= 70 to demo improved  functional mobility    Time 6    Period Weeks    Status New    Target Date  05/25/21      PT LONG TERM GOAL #3   Title Pt will improve lumbar ROM by 25% in all directions to decrease symptoms with transitions    Time 6    Period Weeks    Status New    Target Date 05/25/21      PT LONG TERM GOAL #4   Title Pt will tolerate standing x 30 minutes with symptoms <= 2/10    Time 6    Period Weeks    Status New    Target Date 05/25/21                   Plan - 04/12/21 1049     Clinical Impression Statement Continued up and down course of progress with several days of improvement followed by a flare up in the past two days. Patient demonstrates increased LE strength. She has continued muscular tightness in the LB and bilat posterior hips Lt > Rt today. May benefit from trial of DN    Rehab Potential Good    PT Frequency 2x / week    PT Duration 6 weeks    PT Treatment/Interventions Aquatic Therapy;Cryotherapy;Electrical Stimulation;Moist Heat;Traction;Iontophoresis 4mg /ml Dexamethasone;Therapeutic activities;Therapeutic exercise;Balance training;Neuromuscular re-education;Stair training;Gait training;Dry needling;Taping;Patient/family education;Manual techniques    PT Next Visit Plan progress HEP, progress core strength and lumbar flexibility; hold taping; progress core strengthening exercises avoiding irritation of the Lt knee    PT Home Exercise Plan [email or web address]    Consulted and Agree with Plan of Care Patient             Patient will benefit from skilled therapeutic intervention in order to improve the following deficits and impairments:     Visit Diagnosis: Abnormal posture  Acute bilateral low back pain with right-sided sciatica  Other symptoms and signs involving the musculoskeletal system     Problem List Patient Active Problem List   Diagnosis Date Noted   Globus sensation 03/29/2021   Dry mouth 03/29/2020   Seborrheic dermatitis of scalp 01/22/2020   Cervical radiculopathy 08/25/2019   History of esophagogastroduodenoscopy (EGD)  02/08/2018   Colon cancer screening 02/08/2018   Essential hypertension 12/25/2017   GERD (gastroesophageal reflux disease) 08/30/2017   Chronic diastolic (congestive) heart failure ([place]) 08/30/2017   History of atrial fibrillation 08/30/2017   Flushing 07/30/2017   Paresthesia of both hands 07/30/2017   Postsurgical hypothyroidism 07/30/2017   Aortic atherosclerosis ([place]) 04/28/2013   S/P cataract extraction 01/16/2013   Pseudophakia of both eyes 12/20/2012   Schatzki's ring 10/16/2012   Spinal stenosis of lumbar region 08/16/2012   Vaginal atrophy 11/14/2011   Graves' disease 05/05/2011   Disc herniation    Osteopenia    PERSONAL HISTORY OF ALLERGY TO LATEX 12/23/2009   UNSPECIFIED DISEASE OF PHARYNX 12/16/2008   FATIGUE 11/10/2008   Intermittent palpitations 11/10/2008   Hypothyroidism 09/22/2008   Hyperlipidemia 09/22/2008   ALLERGIC RHINITIS 09/22/2008    Bethsaida Siegenthaler Nilda Simmer, PT, MPH  04/12/2021, 11:03 AM  [facility] [place] [address] [place], [place], [address] Phone: [phone]   Fax:  [phone]  Name: Anne [name] MRN: [id number] Date of Birth: 16-Oct-1950

## 2021-04-12 NOTE — Patient Instructions (Addendum)
Trigger Point Dry Needling  What is Trigger Point Dry Needling (DN)? DN is a physical therapy technique used to treat muscle pain and dysfunction. Specifically, DN helps deactivate muscle trigger points (muscle knots).  A thin filiform needle is used to penetrate the skin and stimulate the underlying trigger point. The goal is for a local twitch response (LTR) to occur and for the trigger point to relax. No medication of any kind is injected during the procedure.   What Does Trigger Point Dry Needling Feel Like?  The procedure feels different for each individual patient. Some patients report that they do not actually feel the needle enter the skin and overall the process is not painful. Very mild bleeding may occur. However, many patients feel a deep cramping in the muscle in which the needle was inserted. This is the local twitch response.   How Will I feel after the treatment? Soreness is normal, and the onset of soreness may not occur for a few hours. Typically this soreness does not last longer than two days.  Bruising is uncommon, however; ice can be used to decrease any possible bruising.  In rare cases feeling tired or nauseous after the treatment is normal. In addition, your symptoms may get worse before they get better, this period will typically not last longer than 24 hours.   What Can I do After My Treatment? Increase your hydration by drinking more water for the next 24 hours. You may place ice or heat on the areas treated that have become sore, however, do not use heat on inflamed or bruised areas. Heat often brings more relief post needling. You can continue your regular activities, but vigorous activity is not recommended initially after the treatment for 24 hours. DN is best combined with other physical therapy such as strengthening, stretching, and other therapies.  Access Code: [facility] URL: [email or web address] Date: 04/12/2021 Prepared by: Gillermo Murdoch  Exercises Supine Transversus Abdominis Bracing - Hands on Stomach - 1 x daily - 7 x weekly - 2 sets - 10 reps - 5 sec hold Hooklying Clamshell with Resistance - 1 x daily - 7 x weekly - 2 sets - 10 reps Seated Hamstring Stretch - 1 x daily - 7 x weekly - 3 sets - 1 reps - 20-30 sec hold Seated Hip Flexor Stretch - 1 x daily - 7 x weekly - 3 sets - 1 reps - 20-30 sec hold Hooklying Hamstring Stretch with Strap - 2 x daily - 7 x weekly - 1 sets - 3 reps - 30 sec hold Supine ITB Stretch with Strap - 2 x daily - 7 x weekly - 1 sets - 3 reps - 30 sec hold Supine Piriformis Stretch with Leg Straight - 2 x daily - 7 x weekly - 1 sets - 3 reps - 30 sec hold Single Leg Stance with Support - 2 x daily - 7 x weekly - 1 sets - 3 reps - 10-20 sec hold Wall Quarter Squat - 2 x daily - 7 x weekly - 1-2 sets - 10 reps - 5-10 sec hold Shoulder External Rotation and Scapular Retraction with Resistance - 2 x daily - 7 x weekly - 3 sets - 10 reps Scapular Retraction with Resistance - 2 x daily - 7 x weekly - 3 sets - 10 reps Scapular Retraction with Resistance Advanced - 2 x daily - 7 x weekly - 3 sets - 10 reps Anti-Rotation Lateral Stepping with Press - 2 x daily - 7  x weekly - 1-2 sets - 10 reps - 2-3 sec hold

## 2021-04-14 ENCOUNTER — Encounter: Payer: Self-pay | Admitting: Rehabilitative and Restorative Service Providers"

## 2021-04-14 ENCOUNTER — Ambulatory Visit (INDEPENDENT_AMBULATORY_CARE_PROVIDER_SITE_OTHER): Payer: Medicare Other | Admitting: Rehabilitative and Restorative Service Providers"

## 2021-04-14 ENCOUNTER — Other Ambulatory Visit: Payer: Self-pay

## 2021-04-14 DIAGNOSIS — M5441 Lumbago with sciatica, right side: Secondary | ICD-10-CM

## 2021-04-14 DIAGNOSIS — R293 Abnormal posture: Secondary | ICD-10-CM

## 2021-04-14 DIAGNOSIS — R29898 Other symptoms and signs involving the musculoskeletal system: Secondary | ICD-10-CM | POA: Diagnosis not present

## 2021-04-14 NOTE — Patient Instructions (Signed)
Access Code: [facility] URL: [email or web address] Date: 04/14/2021 Prepared by: Gillermo Murdoch  Exercises Supine Transversus Abdominis Bracing - Hands on Stomach - 1 x daily - 7 x weekly - 2 sets - 10 reps - 5 sec hold Hooklying Clamshell with Resistance - 1 x daily - 7 x weekly - 2 sets - 10 reps Seated Hamstring Stretch - 1 x daily - 7 x weekly - 3 sets - 1 reps - 20-30 sec hold Seated Hip Flexor Stretch - 1 x daily - 7 x weekly - 3 sets - 1 reps - 20-30 sec hold Hooklying Hamstring Stretch with Strap - 2 x daily - 7 x weekly - 1 sets - 3 reps - 30 sec hold Supine ITB Stretch with Strap - 2 x daily - 7 x weekly - 1 sets - 3 reps - 30 sec hold Supine Piriformis Stretch with Leg Straight - 2 x daily - 7 x weekly - 1 sets - 3 reps - 30 sec hold Single Leg Stance with Support - 2 x daily - 7 x weekly - 1 sets - 3 reps - 10-20 sec hold Wall Quarter Squat - 2 x daily - 7 x weekly - 1-2 sets - 10 reps - 5-10 sec hold Shoulder External Rotation and Scapular Retraction with Resistance - 2 x daily - 7 x weekly - 3 sets - 10 reps Scapular Retraction with Resistance - 2 x daily - 7 x weekly - 3 sets - 10 reps Scapular Retraction with Resistance Advanced - 2 x daily - 7 x weekly - 3 sets - 10 reps Anti-Rotation Lateral Stepping with Press - 2 x daily - 7 x weekly - 1-2 sets - 10 reps - 2-3 sec hold Standing Terminal Knee Extension with Resistance - 2 x daily - 7 x weekly - 2-3 sets - 10 reps - 3-5 sec hold

## 2021-04-14 NOTE — Therapy (Signed)
[place], [place], [address] Phone: [phone]   Fax:  [phone]  Physical Therapy Treatment  Patient Details  Name: Anne [name] MRN: [id number] Date of Birth: May 31, 1951 Referring Provider (PT): Bo Merino   Encounter Date: 04/14/2021   PT End of Session - 04/14/21 1024     Visit Number 5    Number of Visits 12    Date for PT Re-Evaluation 05/25/21    Authorization Type medicare    Authorization - Visit Number 5    Progress Note Due on Visit 10    PT Start Time 7408    PT Stop Time 1105    PT Time Calculation (min) 50 min    Activity Tolerance Patient tolerated treatment well             Past Medical History:  Diagnosis Date   Acid reflux    Aortic atherosclerosis ([place]) 04/28/2013   CT 08/2017   Arthritis    "joints, hands" (08/30/2017)   Chronic lower back pain    Disc herniation    neck and low back   Family history of adverse reaction to anesthesia    "daughter w/PONV"   Follicular cystitis    Hiatal hernia    High cholesterol    Hyperlipidemia    ELEV CHOLESTEROL   Hypothyroidism    Osteopenia 2014   T score -1.6 FRAX 13%/1%   PAF (paroxysmal atrial fibrillation) ([place]) 08/30/2017   Echo 2/19: EF 55-60, normal wall motion, grade 1 diastolic dysfunction, mild PI, trivial TR //  Nuc 3/19: no ischemia or scar, EF 80 // CHADS2-VASc: 3 // Eliquis 5 bid    Schatzki's ring     Past Surgical History:  Procedure Laterality Date   BREAST BIOPSY Bilateral 1997   "excisional bx both benign"   BREAST CYST ASPIRATION Right ~ [place] Left 2011   CATARACT EXTRACTION W/ INTRAOCULAR LENS IMPLANT Right 12/20/2012   Dr. Haynes Bast, OD   COLONOSCOPY WITH ESOPHAGOGASTRODUODENOSCOPY (EGD)  "several"   "? dilated esophagus" (08/30/2017)   DILATION AND CURETTAGE OF UTERUS  1996   EYE MUSCLE SURGERY Bilateral 1992   HERNIA  REPAIR     HYSTEROSCOPY     LAPAROSCOPIC CHOLECYSTECTOMY  09/28/2011   OOPHORECTOMY Right 04/2000   LAP RSO/LYSIS OF ADHESIONS   THYROIDECTOMY  1976   TUBAL LIGATION  1976   VARICOSE VEIN SURGERY Bilateral 1972   VENTRAL HERNIA REPAIR  06/23/13   Dr. Franz Dell    There were no vitals filed for this visit.   Subjective Assessment - 04/14/21 1028     Subjective Patient reports that her back is sore today. She has continued pain in the Lt knee with ascending a step with the Lt LE. She does not want to try DN    Currently in Pain? Yes    Pain Score 3     Pain Location Back    Pain Orientation Right;Left;Lower    Pain Descriptors / Indicators Aching    Pain Type Chronic pain    Pain Onset More than a month ago    Pain Frequency Intermittent                               [facility] Adult PT Treatment/Exercise - 04/14/21 0001       Lumbar Exercises: Aerobic   Nustep  L5 x 5 min      Lumbar Exercises: Standing   Wall Slides 5 reps   10 sec hold   Wall Slides Limitations small range due to Lt knee pain    Scapular Retraction Strengthening;Both;10 reps;Theraband    Theraband Level (Scapular Retraction) Level 2 (Red)    Row Strengthening;Both;10 reps;Theraband    Theraband Level (Row) Level 3 (Green)    Shoulder Extension Strengthening;Both;10 reps;Theraband    Theraband Level (Shoulder Extension) Level 3 (Green)    Other Standing Lumbar Exercises SLS 10 sec x 2 reps ea side    Other Standing Lumbar Exercises antirotation green x 10 reps each side 3 sec hold      Lumbar Exercises: Supine   Clam 15 reps   holding one LE still moving opposite   Clam Limitations green TB    Bridge Limitations painful    Other Supine Lumbar Exercises UE exercise 3# wt each hand - punch to ceiling x 10 bilat the repeated alternating UE's; shoudler flexion from 90 deg to 140 deg alternating UE's; shoulders 90 deg for rotation of UE's x 10    Other Supine Lumbar Exercises shoulder  diagonals red TB x 10 each side; horizontal abduction red TB x 10                     PT Education - 04/14/21 1058     Education Details HEP    Person(s) Educated Patient    Methods Explanation;Demonstration;Tactile cues;Verbal cues;Handout    Comprehension Verbalized understanding;Returned demonstration;Verbal cues required;Tactile cues required                 PT Long Term Goals - 03/30/21 1206       PT LONG TERM GOAL #1   Title Pt will be independent in HEP    Time 6    Period Weeks    Status New    Target Date 05/25/21      PT LONG TERM GOAL #2   Title Pt will improve FOTO to >= 70 to demo improved functional mobility    Time 6    Period Weeks    Status New    Target Date 05/25/21      PT LONG TERM GOAL #3   Title Pt will improve lumbar ROM by 25% in all directions to decrease symptoms with transitions    Time 6    Period Weeks    Status New    Target Date 05/25/21      PT LONG TERM GOAL #4   Title Pt will tolerate standing x 30 minutes with symptoms <= 2/10    Time 6    Period Weeks    Status New    Target Date 05/25/21                   Plan - 04/14/21 1031     Clinical Impression Statement Patient reports that she will restart her walking program this week. She does not want to try DN in the LB area. Difficulty progressing with knee strengthening due to pain. Adding LE strengthening should help progress with core stabilization. Added UE exercises in the clinic to work core. Patient to continue with HEP and add walking program.    Rehab Potential Good    PT Frequency 2x / week    PT Duration 6 weeks    PT Treatment/Interventions Aquatic Therapy;Cryotherapy;Electrical Stimulation;Moist Heat;Traction;Iontophoresis 4mg /ml Dexamethasone;Therapeutic activities;Therapeutic exercise;Balance training;Neuromuscular re-education;Stair training;Gait training;Dry needling;Taping;Patient/family education;Manual  techniques    PT Next Visit Plan  progress HEP, progress core strength and lumbar flexibility; hold taping; does not want to try DN;  progress core strengthening exercises avoiding irritation of the Lt knee    PT Home Exercise Plan [email or web address]    Consulted and Agree with Plan of Care Patient             Patient will benefit from skilled therapeutic intervention in order to improve the following deficits and impairments:     Visit Diagnosis: Abnormal posture  Acute bilateral low back pain with right-sided sciatica  Other symptoms and signs involving the musculoskeletal system     Problem List Patient Active Problem List   Diagnosis Date Noted   Globus sensation 03/29/2021   Dry mouth 03/29/2020   Seborrheic dermatitis of scalp 01/22/2020   Cervical radiculopathy 08/25/2019   History of esophagogastroduodenoscopy (EGD) 02/08/2018   Colon cancer screening 02/08/2018   Essential hypertension 12/25/2017   GERD (gastroesophageal reflux disease) 08/30/2017   Chronic diastolic (congestive) heart failure ([place]) 08/30/2017   History of atrial fibrillation 08/30/2017   Flushing 07/30/2017   Paresthesia of both hands 07/30/2017   Postsurgical hypothyroidism 07/30/2017   Aortic atherosclerosis ([place]) 04/28/2013   S/P cataract extraction 01/16/2013   Pseudophakia of both eyes 12/20/2012   Schatzki's ring 10/16/2012   Spinal stenosis of lumbar region 08/16/2012   Vaginal atrophy 11/14/2011   Graves' disease 05/05/2011   Disc herniation    Osteopenia    PERSONAL HISTORY OF ALLERGY TO LATEX 12/23/2009   UNSPECIFIED DISEASE OF PHARYNX 12/16/2008   FATIGUE 11/10/2008   Intermittent palpitations 11/10/2008   Hypothyroidism 09/22/2008   Hyperlipidemia 09/22/2008   ALLERGIC RHINITIS 09/22/2008    Ariyan Sinnett Nilda Simmer, PT, MPH  04/14/2021, 11:04 AM  [facility] [place] [address] [place], [place], [address] Phone: [phone]   Fax:  [phone]  Name: Ariell Gunnels MRN: [id number] Date of Birth: Dec 12, 1950

## 2021-04-20 ENCOUNTER — Encounter: Payer: Self-pay | Admitting: Rehabilitative and Restorative Service Providers"

## 2021-04-20 ENCOUNTER — Other Ambulatory Visit: Payer: Self-pay

## 2021-04-20 ENCOUNTER — Ambulatory Visit (INDEPENDENT_AMBULATORY_CARE_PROVIDER_SITE_OTHER): Payer: Medicare Other | Admitting: Rehabilitative and Restorative Service Providers"

## 2021-04-20 DIAGNOSIS — M5441 Lumbago with sciatica, right side: Secondary | ICD-10-CM | POA: Diagnosis not present

## 2021-04-20 DIAGNOSIS — R293 Abnormal posture: Secondary | ICD-10-CM | POA: Diagnosis not present

## 2021-04-20 DIAGNOSIS — R29898 Other symptoms and signs involving the musculoskeletal system: Secondary | ICD-10-CM | POA: Diagnosis not present

## 2021-04-20 NOTE — Patient Instructions (Signed)
Standing with theraband in hands for rowing and hands at side, step back to feel a good resistance and hold for 10 sec then slowly step forward Repeat start with 5 reps and increase to 10 reps

## 2021-04-20 NOTE — Therapy (Signed)
[place], [place], [address] Phone: [phone]   Fax:  [phone]  Physical Therapy Treatment  Patient Details  Name: Anne [name] MRN: [id number] Date of Birth: 13-Dec-1950 Referring Provider (PT): Bo Merino   Encounter Date: 04/20/2021   PT End of Session - 04/20/21 1438     Visit Number 6    Number of Visits 12    Date for PT Re-Evaluation 05/25/21    Authorization Type medicare    Authorization - Visit Number 6    Progress Note Due on Visit 10    PT Start Time 2585    PT Stop Time 2778    PT Time Calculation (min) 44 min    Activity Tolerance Patient tolerated treatment well             Past Medical History:  Diagnosis Date   Acid reflux    Aortic atherosclerosis ([place]) 04/28/2013   CT 08/2017   Arthritis    "joints, hands" (08/30/2017)   Chronic lower back pain    Disc herniation    neck and low back   Family history of adverse reaction to anesthesia    "daughter w/PONV"   Follicular cystitis    Hiatal hernia    High cholesterol    Hyperlipidemia    ELEV CHOLESTEROL   Hypothyroidism    Osteopenia 2014   T score -1.6 FRAX 13%/1%   PAF (paroxysmal atrial fibrillation) ([place]) 08/30/2017   Echo 2/19: EF 55-60, normal wall motion, grade 1 diastolic dysfunction, mild PI, trivial TR //  Nuc 3/19: no ischemia or scar, EF 80 // CHADS2-VASc: 3 // Eliquis 5 bid    Schatzki's ring     Past Surgical History:  Procedure Laterality Date   BREAST BIOPSY Bilateral 1997   "excisional bx both benign"   BREAST CYST ASPIRATION Right ~ [place] Left 2011   CATARACT EXTRACTION W/ INTRAOCULAR LENS IMPLANT Right 12/20/2012   Dr. Haynes Bast, OD   COLONOSCOPY WITH ESOPHAGOGASTRODUODENOSCOPY (EGD)  "several"   "? dilated esophagus" (08/30/2017)   DILATION AND CURETTAGE OF UTERUS  1996   EYE MUSCLE SURGERY Bilateral 1992   HERNIA  REPAIR     HYSTEROSCOPY     LAPAROSCOPIC CHOLECYSTECTOMY  09/28/2011   OOPHORECTOMY Right 04/2000   LAP RSO/LYSIS OF ADHESIONS   THYROIDECTOMY  1976   TUBAL LIGATION  1976   VARICOSE VEIN SURGERY Bilateral 1972   VENTRAL HERNIA REPAIR  06/23/13   Dr. Franz Dell    There were no vitals filed for this visit.   Subjective Assessment - 04/20/21 1438     Subjective Patient reports that she is working on her exercises at home. She walked in [place] for about an hour yesterday - noted some pain in the LB but pain returned to baseline by the time she rode home. LBP is about a 3/10 most of the time but does have some time when she is pain free. Up to about 8-9/10 after walking yesterday. She is using TENS unit some at home.    Currently in Pain? Yes    Pain Score 3     Pain Location Back    Pain Orientation Right;Left;Lower    Pain Descriptors / Indicators Aching    Pain Type Chronic pain    Pain Onset More than a month ago    Pain Frequency Intermittent  [facility] PT Assessment - 04/20/21 0001       Assessment   Medical Diagnosis lumbar spinal stenosis, Rt sided low back pain with Rt sciatica    Referring Provider (PT) Bo Merino    Onset Date/Surgical Date 02/24/21    Hand Dominance Right    Next MD Visit PRN    Prior Therapy yes for cervical      Strength   Overall Strength Comments decreased core strength/stability      Flexibility   Hamstrings tight bilat    Piriformis tight bilat    Obturator Internus tight bilat                           [facility] Adult PT Treatment/Exercise - 04/20/21 0001       Lumbar Exercises: Stretches   Passive Hamstring Stretch Right;Left;3 reps;30 seconds    Passive Hamstring Stretch Limitations supine with strap    Hip Flexor Stretch Right;Left;2 reps;30 seconds    Hip Flexor Stretch Limitations seated    ITB Stretch Right;Left;2 reps;30 seconds    ITB Stretch Limitations supine with strap     Piriformis Stretch Right;Left;3 reps;30 seconds    Piriformis Stretch Limitations supine travell      Lumbar Exercises: Aerobic   Nustep L5 x 5 min      Lumbar Exercises: Standing   Side Lunge Limitations hip abduction; hip extension knee straight core engaged green TB above knees x 10 each LE    Wall Slides 5 reps   10 sec hold   Wall Slides Limitations small range due to Lt knee pain    Row Strengthening;Both;Theraband;5 reps    Theraband Level (Row) Level 3 (Green)    Row Limitations reactive isometric holding 10 sec    Shoulder Extension Strengthening;Both;Theraband;5 reps    Theraband Level (Shoulder Extension) Level 3 (Green)    Shoulder Extension Limitations reactive isometric hold    Other Standing Lumbar Exercises SLS 20 sec x 4 reps ea side decreased UE assist for balance    Other Standing Lumbar Exercises antirotation green x 10 reps each side 3 sec hold      Lumbar Exercises: Supine   Ab Set 10 reps    Clam 15 reps   holding one LE still moving opposite   Clam Limitations green TB    Other Supine Lumbar Exercises UE exercise 3# wt each hand - punch to ceiling x 10 bilat the repeated alternating UE's; shoudler flexion from 90 deg to 140 deg alternating UE's; shoulders 90 deg for rotation of UE's x 10    Other Supine Lumbar Exercises shoulder diagonals red TB x 10 each side; horizontal abduction red TB x 10                     PT Education - 04/20/21 1510     Education Details HEP    Person(s) Educated Patient    Methods Explanation;Demonstration;Tactile cues;Verbal cues;Handout    Comprehension Verbalized understanding;Returned demonstration;Verbal cues required;Tactile cues required                 PT Long Term Goals - 03/30/21 1206       PT LONG TERM GOAL #1   Title Pt will be independent in HEP    Time 6    Period Weeks    Status New    Target Date 05/25/21      PT LONG TERM GOAL #2   Title  Pt will improve FOTO to >= 70 to demo improved  functional mobility    Time 6    Period Weeks    Status New    Target Date 05/25/21      PT LONG TERM GOAL #3   Title Pt will improve lumbar ROM by 25% in all directions to decrease symptoms with transitions    Time 6    Period Weeks    Status New    Target Date 05/25/21      PT LONG TERM GOAL #4   Title Pt will tolerate standing x 30 minutes with symptoms <= 2/10    Time 6    Period Weeks    Status New    Target Date 05/25/21                   Plan - 04/20/21 1456     Clinical Impression Statement Patient reports increased tolerance for standing and walking - stating that she walked in [employer] for about an hour yesterday. She had increased pain but returned to baseline in about 30 min riding home. Patient continues to work on [employer].    Rehab Potential Good    PT Frequency 2x / week    PT Duration 6 weeks    PT Treatment/Interventions Aquatic Therapy;Cryotherapy;Electrical Stimulation;Moist Heat;Traction;Iontophoresis 4mg /ml Dexamethasone;Therapeutic activities;Therapeutic exercise;Balance training;Neuromuscular re-education;Stair training;Gait training;Dry needling;Taping;Patient/family education;Manual techniques    PT Next Visit Plan progress HEP, progress core strength and lumbar flexibility; hold taping; does not want to try DN;  progress core strengthening exercises avoiding irritation of the Lt knee    PT Home Exercise Plan [id number]    Consulted and Agree with Plan of Care Patient             Patient will benefit from skilled therapeutic intervention in order to improve the following deficits and impairments:     Visit Diagnosis: Abnormal posture  Acute bilateral low back pain with right-sided sciatica  Other symptoms and signs involving the musculoskeletal system     Problem List Patient Active Problem List   Diagnosis Date Noted   Globus sensation 03/29/2021   Dry mouth 03/29/2020   Seborrheic dermatitis of scalp 01/22/2020   Cervical  radiculopathy 08/25/2019   History of esophagogastroduodenoscopy (EGD) 02/08/2018   Colon cancer screening 02/08/2018   Essential hypertension 12/25/2017   GERD (gastroesophageal reflux disease) 08/30/2017   Chronic diastolic (congestive) heart failure ([place]) 08/30/2017   History of atrial fibrillation 08/30/2017   Flushing 07/30/2017   Paresthesia of both hands 07/30/2017   Postsurgical hypothyroidism 07/30/2017   Aortic atherosclerosis ([place]) 04/28/2013   S/P cataract extraction 01/16/2013   Pseudophakia of both eyes 12/20/2012   Schatzki's ring 10/16/2012   Spinal stenosis of lumbar region 08/16/2012   Vaginal atrophy 11/14/2011   Graves' disease 05/05/2011   Disc herniation    Osteopenia    PERSONAL HISTORY OF ALLERGY TO LATEX 12/23/2009   UNSPECIFIED DISEASE OF PHARYNX 12/16/2008   FATIGUE 11/10/2008   Intermittent palpitations 11/10/2008   Hypothyroidism 09/22/2008   Hyperlipidemia 09/22/2008   ALLERGIC RHINITIS 09/22/2008    Jihan Rudy Nilda Simmer, PT,MPH  04/20/2021, 3:14 PM  [facility] [place] [address] [place], [place], [address] Phone: [phone]   Fax:  [phone]  Name: Louine Tenpenny MRN: [id number] Date of Birth: 12/29/1950

## 2021-04-22 ENCOUNTER — Ambulatory Visit (INDEPENDENT_AMBULATORY_CARE_PROVIDER_SITE_OTHER): Payer: Medicare Other | Admitting: Rehabilitative and Restorative Service Providers"

## 2021-04-22 ENCOUNTER — Encounter: Payer: Self-pay | Admitting: Rehabilitative and Restorative Service Providers"

## 2021-04-22 ENCOUNTER — Other Ambulatory Visit: Payer: Self-pay

## 2021-04-22 DIAGNOSIS — R293 Abnormal posture: Secondary | ICD-10-CM

## 2021-04-22 DIAGNOSIS — M5441 Lumbago with sciatica, right side: Secondary | ICD-10-CM | POA: Diagnosis not present

## 2021-04-22 DIAGNOSIS — R29898 Other symptoms and signs involving the musculoskeletal system: Secondary | ICD-10-CM | POA: Diagnosis not present

## 2021-04-22 NOTE — Therapy (Signed)
[place], [place], [address] Phone: [phone]   Fax:  [phone]  Physical Therapy Treatment  Patient Details  Name: Anne [name] MRN: [id number] Date of Birth: [birth year] Referring Provider (PT): Bo Merino   Encounter Date: 04/22/2021   PT End of Session - 04/22/21 1104     Visit Number 7    Number of Visits 12    Date for PT Re-Evaluation 05/25/21    Authorization Type medicare    Authorization - Visit Number 7    Progress Note Due on Visit 10    PT Start Time 1103    PT Stop Time 3335    PT Time Calculation (min) 52 min    Activity Tolerance Patient tolerated treatment well             Past Medical History:  Diagnosis Date   Acid reflux    Aortic atherosclerosis ([place]) 04/28/2013   CT 08/2017   Arthritis    "joints, hands" (08/30/2017)   Chronic lower back pain    Disc herniation    neck and low back   Family history of adverse reaction to anesthesia    "daughter w/PONV"   Follicular cystitis    Hiatal hernia    High cholesterol    Hyperlipidemia    ELEV CHOLESTEROL   Hypothyroidism    Osteopenia 2014   T score -1.6 FRAX 13%/1%   PAF (paroxysmal atrial fibrillation) ([place]) 08/30/2017   Echo 2/19: EF 55-60, normal wall motion, grade 1 diastolic dysfunction, mild PI, trivial TR //  Nuc 3/19: no ischemia or scar, EF 80 // CHADS2-VASc: 3 // Eliquis 5 bid    Schatzki's ring     Past Surgical History:  Procedure Laterality Date   BREAST BIOPSY Bilateral 1997   "excisional bx both benign"   BREAST CYST ASPIRATION Right ~ [place] Left 2011   CATARACT EXTRACTION W/ INTRAOCULAR LENS IMPLANT Right 12/20/2012   Dr. Haynes Bast, OD   COLONOSCOPY WITH ESOPHAGOGASTRODUODENOSCOPY (EGD)  "several"   "? dilated esophagus" (08/30/2017)   DILATION AND CURETTAGE OF UTERUS  1996   EYE MUSCLE SURGERY Bilateral 1992   HERNIA  REPAIR     HYSTEROSCOPY     LAPAROSCOPIC CHOLECYSTECTOMY  09/28/2011   OOPHORECTOMY Right 04/2000   LAP RSO/LYSIS OF ADHESIONS   THYROIDECTOMY  1976   TUBAL LIGATION  1976   VARICOSE VEIN SURGERY Bilateral 1972   VENTRAL HERNIA REPAIR  06/23/13   Dr. Franz Dell    There were no vitals filed for this visit.   Subjective Assessment - 04/22/21 1114     Subjective Patient reports that she has some painin the Rt LE in hip into the lateral leg to ankle today. She did more yesterday, more exercises and helped her sister. Has to reach in awkward way to help sister with bathing and washing hair. Suggested that Ladeidra try a velcro lumbar support during the time she is helping her sister with bathing.    Currently in Pain? Yes    Pain Score 3     Pain Location Back    Pain Orientation Right;Left;Lower    Pain Descriptors / Indicators Aching    Pain Type Chronic pain    Pain Onset More than a month ago  [place] Adult PT Treatment/Exercise - 04/22/21 0001       Self-Care   Self-Care Other Self-Care Comments    Other Self-Care Comments  myofacial ball release work standing working through the posterior hip      Lumbar Exercises: Diplomatic Services operational officer Right;Left;3 reps;30 seconds    Active Hamstring Stretch Limitations seated with cues for hip hinge    Hip Flexor Stretch Right;Left;2 reps;30 seconds    Hip Flexor Stretch Limitations seated      Lumbar Exercises: Aerobic   Nustep L5 x 5 min      Moist Heat Therapy   Number Minutes Moist Heat 10 Minutes    Moist Heat Location Lumbar Spine;Hip   Rt posterior hip     Manual Therapy   Soft tissue mobilization deep tissue work through Rt > Lt lumbar musculature into the Rt posterior hip                          PT Long Term Goals - 03/30/21 1206       PT LONG TERM GOAL #1   Title Pt will be independent in HEP    Time 6    Period Weeks    Status New     Target Date 05/25/21      PT LONG TERM GOAL #2   Title Pt will improve FOTO to >= 70 to demo improved functional mobility    Time 6    Period Weeks    Status New    Target Date 05/25/21      PT LONG TERM GOAL #3   Title Pt will improve lumbar ROM by 25% in all directions to decrease symptoms with transitions    Time 6    Period Weeks    Status New    Target Date 05/25/21      PT LONG TERM GOAL #4   Title Pt will tolerate standing x 30 minutes with symptoms <= 2/10    Time 6    Period Weeks    Status New    Target Date 05/25/21                   Plan - 04/22/21 1128     Clinical Impression Statement Patient returns today with increased radicular pain in the Rt LE posterior hip and thigh to lateral leg to ankle. Went through exercises in clinic with some resolution of symptoms.    Rehab Potential Good    PT Frequency 2x / week    PT Duration 6 weeks    PT Treatment/Interventions Aquatic Therapy;Cryotherapy;Electrical Stimulation;Moist Heat;Traction;Iontophoresis 4mg /ml Dexamethasone;Therapeutic activities;Therapeutic exercise;Balance training;Neuromuscular re-education;Stair training;Gait training;Dry needling;Taping;Patient/family education;Manual techniques    PT Next Visit Plan progress HEP, progress core strength and lumbar flexibility; hold taping; does not want to try DN;  progress core strengthening exercises avoiding irritation of the Lt knee    PT Home Exercise Plan [id number]    Consulted and Agree with Plan of Care Patient             Patient will benefit from skilled therapeutic intervention in order to improve the following deficits and impairments:     Visit Diagnosis: Abnormal posture  Acute bilateral low back pain with right-sided sciatica  Other symptoms and signs involving the musculoskeletal system     Problem List Patient Active Problem List   Diagnosis Date Noted   Globus sensation 03/29/2021   Dry mouth 03/29/2020  Seborrheic  dermatitis of scalp 01/22/2020   Cervical radiculopathy 08/25/2019   History of esophagogastroduodenoscopy (EGD) 02/08/2018   Colon cancer screening 02/08/2018   Essential hypertension 12/25/2017   GERD (gastroesophageal reflux disease) 08/30/2017   Chronic diastolic (congestive) heart failure ([place]) 08/30/2017   History of atrial fibrillation 08/30/2017   Flushing 07/30/2017   Paresthesia of both hands 07/30/2017   Postsurgical hypothyroidism 07/30/2017   Aortic atherosclerosis ([place]) 04/28/2013   S/P cataract extraction 01/16/2013   Pseudophakia of both eyes 12/20/2012   Schatzki's ring 10/16/2012   Spinal stenosis of lumbar region 08/16/2012   Vaginal atrophy 11/14/2011   Graves' disease 05/05/2011   Disc herniation    Osteopenia    PERSONAL HISTORY OF ALLERGY TO LATEX 12/23/2009   UNSPECIFIED DISEASE OF PHARYNX 12/16/2008   FATIGUE 11/10/2008   Intermittent palpitations 11/10/2008   Hypothyroidism 09/22/2008   Hyperlipidemia 09/22/2008   ALLERGIC RHINITIS 09/22/2008    Idil Maslanka Nilda Simmer, PT, MPH  04/22/2021, 11:54 AM  [facility] [place] [address] [place], [place], [address] Phone: [phone]   Fax:  [phone]  Name: Shardee Dieu MRN: [id number] Date of Birth: 05-12-1951

## 2021-04-26 ENCOUNTER — Other Ambulatory Visit: Payer: Self-pay

## 2021-04-26 ENCOUNTER — Encounter: Payer: Self-pay | Admitting: Rehabilitative and Restorative Service Providers"

## 2021-04-26 ENCOUNTER — Ambulatory Visit (INDEPENDENT_AMBULATORY_CARE_PROVIDER_SITE_OTHER): Payer: Medicare Other | Admitting: Rehabilitative and Restorative Service Providers"

## 2021-04-26 DIAGNOSIS — R293 Abnormal posture: Secondary | ICD-10-CM | POA: Diagnosis not present

## 2021-04-26 DIAGNOSIS — M5441 Lumbago with sciatica, right side: Secondary | ICD-10-CM | POA: Diagnosis not present

## 2021-04-26 DIAGNOSIS — M542 Cervicalgia: Secondary | ICD-10-CM | POA: Diagnosis not present

## 2021-04-26 DIAGNOSIS — R29898 Other symptoms and signs involving the musculoskeletal system: Secondary | ICD-10-CM | POA: Diagnosis not present

## 2021-04-26 DIAGNOSIS — R29818 Other symptoms and signs involving the nervous system: Secondary | ICD-10-CM | POA: Diagnosis not present

## 2021-04-26 NOTE — Therapy (Signed)
[place], [place], [address] Phone: [phone]   Fax:  [phone]  Physical Therapy Treatment  Patient Details  Name: Anne [name] MRN: [id number] Date of Birth: January 28, 1951 Referring Provider (PT): Bo Merino   Encounter Date: 04/26/2021   PT End of Session - 04/26/21 1014     Visit Number 8    Number of Visits 12    Date for PT Re-Evaluation 05/25/21    Authorization Type medicare    Authorization - Visit Number 8    Progress Note Due on Visit 10    PT Start Time 1014    PT Stop Time 1105   [place] end of treatment   PT Time Calculation (min) 51 min    Activity Tolerance Patient tolerated treatment well             Past Medical History:  Diagnosis Date   Acid reflux    Aortic atherosclerosis ([place]) 04/28/2013   CT 08/2017   Arthritis    "joints, hands" (08/30/2017)   Chronic lower back pain    Disc herniation    neck and low back   Family history of adverse reaction to anesthesia    "daughter w/PONV"   Follicular cystitis    Hiatal hernia    High cholesterol    Hyperlipidemia    ELEV CHOLESTEROL   Hypothyroidism    Osteopenia 2014   T score -1.6 FRAX 13%/1%   PAF (paroxysmal atrial fibrillation) ([place]) 08/30/2017   Echo 2/19: EF 55-60, normal wall motion, grade 1 diastolic dysfunction, mild PI, trivial TR //  Nuc 3/19: no ischemia or scar, EF 80 // CHADS2-VASc: 3 // Eliquis 5 bid    Schatzki's ring     Past Surgical History:  Procedure Laterality Date   BREAST BIOPSY Bilateral 1997   "excisional bx both benign"   BREAST CYST ASPIRATION Right ~ [place] Left 2011   CATARACT EXTRACTION W/ INTRAOCULAR LENS IMPLANT Right 12/20/2012   Dr. Haynes Bast, OD   COLONOSCOPY WITH ESOPHAGOGASTRODUODENOSCOPY (EGD)  "several"   "? dilated esophagus" (08/30/2017)   DILATION AND CURETTAGE OF UTERUS  1996   EYE MUSCLE SURGERY  Bilateral 1992   HERNIA REPAIR     HYSTEROSCOPY     LAPAROSCOPIC CHOLECYSTECTOMY  09/28/2011   OOPHORECTOMY Right 04/2000   LAP RSO/LYSIS OF ADHESIONS   THYROIDECTOMY  1976   TUBAL LIGATION  1976   VARICOSE VEIN SURGERY Bilateral 1972   VENTRAL HERNIA REPAIR  06/23/13   Dr. Franz Dell    There were no vitals filed for this visit.   Subjective Assessment - 04/26/21 1015     Subjective Patient reports that she awoke with pain yesterday morning. She had some pain through the day yesterday. No pain this morning. she did do some walking in the park walking about 20-25 min twice since Friday. She has been testing her Lt knee stepping up into the house and was able to do it about 3 times.    Currently in Pain? No/denies    Pain Score 0-No pain    Pain Location Back                [facility] PT Assessment - 04/26/21 0001       Assessment   Medical Diagnosis lumbar spinal stenosis, Rt sided low back pain with Rt sciatica    Referring Provider (PT) Estanislado Pandy, Abel Presto    Onset  Date/Surgical Date 02/24/21    Hand Dominance Right    Next MD Visit PRN    Prior Therapy yes for cervical      Palpation   Spinal mobility hypomobile L3-L5    SI assessment  tight Lt> Rt sacral border    Palpation comment muscular tightness lumbar paraspinals; QL; piriformis Rt > Lt                           [facility] Adult PT Treatment/Exercise - 04/26/21 0001       Lumbar Exercises: Aerobic   Nustep L5 x 5 min      Lumbar Exercises: Standing   Lifting Limitations step ups 2" step x 10 each side    Wall Slides 10 reps;5 seconds    Wall Slides Limitations small range due to Lt knee pain    Other Standing Lumbar Exercises SLS 20 sec x 4 reps ea side decreased UE assist for balance; SLS with reaching up cabinet x 4 reps; SLS with head turns each side x 4 ; diver reaching forward to chair arms x 10 each side    Other Standing Lumbar Exercises antirotation green x 10 reps each side 3 sec hold       Moist Heat Therapy   Number Minutes Moist Heat 10 Minutes    Moist Heat Location Lumbar Spine;Hip   Rt posterior hip     Manual Therapy   Soft tissue mobilization deep tissue work through through the sacrum and bilat sacral border; L> Rt lumbar musculature into the Rt posterior hip                          PT Long Term Goals - 03/30/21 1206       PT LONG TERM GOAL #1   Title Pt will be independent in HEP    Time 6    Period Weeks    Status New    Target Date 05/25/21      PT LONG TERM GOAL #2   Title Pt will improve FOTO to >= 70 to demo improved functional mobility    Time 6    Period Weeks    Status New    Target Date 05/25/21      PT LONG TERM GOAL #3   Title Pt will improve lumbar ROM by 25% in all directions to decrease symptoms with transitions    Time 6    Period Weeks    Status New    Target Date 05/25/21      PT LONG TERM GOAL #4   Title Pt will tolerate standing x 30 minutes with symptoms <= 2/10    Time 6    Period Weeks    Status New    Target Date 05/25/21                   Plan - 04/26/21 1019     Clinical Impression Statement Pain continues on an intermittent basis - some days better than others. Not sure what causes increase in pain sometimes, sometimes she knows what has irritated symptoms. Patient is working on her exercises at home and trying to increase activity. Persistent intermittent pain in the lumbar spine/lateral sacral border.    Rehab Potential Good    PT Frequency 2x / week    PT Duration 6 weeks    PT Treatment/Interventions Aquatic Therapy;Cryotherapy;Electrical Stimulation;Moist Heat;Traction;Iontophoresis 4mg /ml Dexamethasone;Therapeutic activities;Therapeutic  exercise;Balance training;Neuromuscular re-education;Stair training;Gait training;Dry needling;Taping;Patient/family education;Manual techniques    PT Next Visit Plan progress HEP, progress core strength and lumbar flexibility; hold taping; does  not want to try DN;  progress core strengthening exercises avoiding irritation of the Lt knee    PT Home Exercise Plan [id number]    Consulted and Agree with Plan of Care Patient             Patient will benefit from skilled therapeutic intervention in order to improve the following deficits and impairments:     Visit Diagnosis: Abnormal posture  Acute bilateral low back pain with right-sided sciatica  Other symptoms and signs involving the musculoskeletal system  Cervicalgia  Other symptoms and signs involving the nervous system     Problem List Patient Active Problem List   Diagnosis Date Noted   Globus sensation 03/29/2021   Dry mouth 03/29/2020   Seborrheic dermatitis of scalp 01/22/2020   Cervical radiculopathy 08/25/2019   History of esophagogastroduodenoscopy (EGD) 02/08/2018   Colon cancer screening 02/08/2018   Essential hypertension 12/25/2017   GERD (gastroesophageal reflux disease) 08/30/2017   Chronic diastolic (congestive) heart failure ([place]) 08/30/2017   History of atrial fibrillation 08/30/2017   Flushing 07/30/2017   Paresthesia of both hands 07/30/2017   Postsurgical hypothyroidism 07/30/2017   Aortic atherosclerosis ([place]) 04/28/2013   S/P cataract extraction 01/16/2013   Pseudophakia of both eyes 12/20/2012   Schatzki's ring 10/16/2012   Spinal stenosis of lumbar region 08/16/2012   Vaginal atrophy 11/14/2011   Graves' disease 05/05/2011   Disc herniation    Osteopenia    PERSONAL HISTORY OF ALLERGY TO LATEX 12/23/2009   UNSPECIFIED DISEASE OF PHARYNX 12/16/2008   FATIGUE 11/10/2008   Intermittent palpitations 11/10/2008   Hypothyroidism 09/22/2008   Hyperlipidemia 09/22/2008   ALLERGIC RHINITIS 09/22/2008    Lorita Forinash Nilda Simmer, PT, MPH  04/26/2021, 11:01 AM  [facility] [place], [place], [address] Phone: [phone]   Fax:  [phone]  Name: Anne [name] MRN: [id number] Date of Birth: September 04, 1950

## 2021-04-28 ENCOUNTER — Encounter: Payer: Medicare Other | Admitting: Rehabilitative and Restorative Service Providers"

## 2021-04-29 ENCOUNTER — Other Ambulatory Visit: Payer: Self-pay

## 2021-04-29 ENCOUNTER — Encounter: Payer: Self-pay | Admitting: Rehabilitative and Restorative Service Providers"

## 2021-04-29 ENCOUNTER — Ambulatory Visit (INDEPENDENT_AMBULATORY_CARE_PROVIDER_SITE_OTHER): Payer: Medicare Other | Admitting: Rehabilitative and Restorative Service Providers"

## 2021-04-29 DIAGNOSIS — M5441 Lumbago with sciatica, right side: Secondary | ICD-10-CM | POA: Diagnosis not present

## 2021-04-29 DIAGNOSIS — R29898 Other symptoms and signs involving the musculoskeletal system: Secondary | ICD-10-CM

## 2021-04-29 DIAGNOSIS — R293 Abnormal posture: Secondary | ICD-10-CM

## 2021-04-29 NOTE — Therapy (Signed)
[facility], [place], [address] Phone: [phone]   Fax:  [phone]  Physical Therapy Treatment  Patient Details  Name: Anne [name] MRN: [id number] Date of Birth: Oct 07, 1950 Referring Provider (PT): Bo Merino   Encounter Date: 04/29/2021   PT End of Session - 04/29/21 0939     Visit Number 9    Number of Visits 12    Date for PT Re-Evaluation 05/25/21    Authorization Type medicare    Progress Note Due on Visit 10    PT Start Time 0930    PT Stop Time 9485    PT Time Calculation (min) 48 min    Activity Tolerance Patient tolerated treatment well             Past Medical History:  Diagnosis Date   Acid reflux    Aortic atherosclerosis ([place]) 04/28/2013   CT 08/2017   Arthritis    "joints, hands" (08/30/2017)   Chronic lower back pain    Disc herniation    neck and low back   Family history of adverse reaction to anesthesia    "daughter w/PONV"   Follicular cystitis    Hiatal hernia    High cholesterol    Hyperlipidemia    ELEV CHOLESTEROL   Hypothyroidism    Osteopenia 2014   T score -1.6 FRAX 13%/1%   PAF (paroxysmal atrial fibrillation) ([place]) 08/30/2017   Echo 2/19: EF 55-60, normal wall motion, grade 1 diastolic dysfunction, mild PI, trivial TR //  Nuc 3/19: no ischemia or scar, EF 80 // CHADS2-VASc: 3 // Eliquis 5 bid    Schatzki's ring     Past Surgical History:  Procedure Laterality Date   BREAST BIOPSY Bilateral 1997   "excisional bx both benign"   BREAST CYST ASPIRATION Right ~ [place] Left 2011   CATARACT EXTRACTION W/ INTRAOCULAR LENS IMPLANT Right 12/20/2012   Dr. Haynes Bast, OD   COLONOSCOPY WITH ESOPHAGOGASTRODUODENOSCOPY (EGD)  "several"   "? dilated esophagus" (08/30/2017)   DILATION AND CURETTAGE OF UTERUS  1996   EYE MUSCLE SURGERY Bilateral 1992   HERNIA REPAIR     HYSTEROSCOPY      LAPAROSCOPIC CHOLECYSTECTOMY  09/28/2011   OOPHORECTOMY Right 04/2000   LAP RSO/LYSIS OF ADHESIONS   THYROIDECTOMY  1976   TUBAL LIGATION  1976   VARICOSE VEIN SURGERY Bilateral 1972   VENTRAL HERNIA REPAIR  06/23/13   Dr. Franz Dell    There were no vitals filed for this visit.   Subjective Assessment - 04/29/21 0940     Subjective Pain is less frequent and she can get rid of the pain or improve the intensity of the pain with exercises and TENS unit.    Currently in Pain? No/denies    Pain Score 0-No pain    Pain Location Back                               [facility] Adult PT Treatment/Exercise - 04/29/21 0001       Lumbar Exercises: Stretches   Passive Hamstring Stretch Right;Left;30 seconds;2 reps    Hip Flexor Stretch Right;Left;2 reps;30 seconds    Hip Flexor Stretch Limitations seated    ITB Stretch Right;Left;2 reps;30 seconds    ITB Stretch Limitations supine with strap    Piriformis Stretch Right;Left;3 reps;30 seconds    Piriformis Stretch  Limitations supine travell      Lumbar Exercises: Aerobic   Nustep L5 x 5 min      Lumbar Exercises: Standing   Wall Slides 10 reps;5 seconds    Wall Slides Limitations small range due to Lt knee pain    Row Strengthening;Both;Theraband;5 reps    Theraband Level (Row) Level 3 (Green)    Row Limitations reactive isometric    Other Standing Lumbar Exercises SLS 20 sec x 4 reps ea side decreased UE assist for balance; SLS with reaching up cabinet x 4 reps; SLS with head turns each side x 4 ; diver reaching forward to chair arms x 10 each side    Other Standing Lumbar Exercises antirotation green x 10 reps each side 3 sec hold      Lumbar Exercises: Supine   Bent Knee Raise Limitations adduction with ball btn knees 5 sec hold x 10 reps      Lumbar Exercises: Prone   Other Prone Lumbar Exercises glut set 5 sec x 10      Manual Therapy   Soft tissue mobilization deep tissue work through through the sacrum and  bilat sacral border; L> Rt lumbar musculature into the Rt posterior hip    Other Manual Therapy inferior mob through the sacrum with ~ 2 min hold                     PT Education - 04/29/21 0957     Education Details HEP    Person(s) Educated Patient    Methods Explanation;Demonstration;Tactile cues;Verbal cues;Handout    Comprehension Verbalized understanding;Returned demonstration;Verbal cues required;Tactile cues required                 PT Long Term Goals - 03/30/21 1206       PT LONG TERM GOAL #1   Title Pt will be independent in HEP    Time 6    Period Weeks    Status New    Target Date 05/25/21      PT LONG TERM GOAL #2   Title Pt will improve FOTO to >= 70 to demo improved functional mobility    Time 6    Period Weeks    Status New    Target Date 05/25/21      PT LONG TERM GOAL #3   Title Pt will improve lumbar ROM by 25% in all directions to decrease symptoms with transitions    Time 6    Period Weeks    Status New    Target Date 05/25/21      PT LONG TERM GOAL #4   Title Pt will tolerate standing x 30 minutes with symptoms <= 2/10    Time 6    Period Weeks    Status New    Target Date 05/25/21                   Plan - 04/29/21 0946     Clinical Impression Statement Patient reports that she is able to get rid of pain when it occurs by doing her exercises and/or using the TENS unit. Note Lt lateral sacral border is tighter with spring testing than Rt. Sacrum demonstrates decreased mobility. Added manual technique for the sacrum - downward pressure and hip adduction with ball in hooklying. Continued with core stabilization and strengthening. Gradually progressing toward stated goals of therapy.    Rehab Potential Good    PT Frequency 2x / week  PT Duration 6 weeks    PT Treatment/Interventions Aquatic Therapy;Cryotherapy;Electrical Stimulation;Moist Heat;Traction;Iontophoresis 4mg /ml Dexamethasone;Therapeutic  activities;Therapeutic exercise;Balance training;Neuromuscular re-education;Stair training;Gait training;Dry needling;Taping;Patient/family education;Manual techniques    PT Next Visit Plan progress HEP, progress core strength and lumbar flexibility; hold taping; does not want to try DN;  progress core strengthening exercises avoiding irritation of the Lt knee    PT Home Exercise Plan [facility]             Patient will benefit from skilled therapeutic intervention in order to improve the following deficits and impairments:     Visit Diagnosis: Abnormal posture  Acute bilateral low back pain with right-sided sciatica  Other symptoms and signs involving the musculoskeletal system     Problem List Patient Active Problem List   Diagnosis Date Noted   Globus sensation 03/29/2021   Dry mouth 03/29/2020   Seborrheic dermatitis of scalp 01/22/2020   Cervical radiculopathy 08/25/2019   History of esophagogastroduodenoscopy (EGD) 02/08/2018   Colon cancer screening 02/08/2018   Essential hypertension 12/25/2017   GERD (gastroesophageal reflux disease) 08/30/2017   Chronic diastolic (congestive) heart failure ([place]) 08/30/2017   History of atrial fibrillation 08/30/2017   Flushing 07/30/2017   Paresthesia of both hands 07/30/2017   Postsurgical hypothyroidism 07/30/2017   Aortic atherosclerosis ([place]) 04/28/2013   S/P cataract extraction 01/16/2013   Pseudophakia of both eyes 12/20/2012   Schatzki's ring 10/16/2012   Spinal stenosis of lumbar region 08/16/2012   Vaginal atrophy 11/14/2011   Graves' disease 05/05/2011   Disc herniation    Osteopenia    PERSONAL HISTORY OF ALLERGY TO LATEX 12/23/2009   UNSPECIFIED DISEASE OF PHARYNX 12/16/2008   FATIGUE 11/10/2008   Intermittent palpitations 11/10/2008   Hypothyroidism 09/22/2008   Hyperlipidemia 09/22/2008   ALLERGIC RHINITIS 09/22/2008    Maaz Spiering Nilda Simmer, PT, MPH 04/29/2021, 10:17 AM  [facility] [address] [place], [place], [address] Phone: [phone]   Fax:  [phone]  Name: Anne [name] MRN: [id number] Date of Birth: 1951/05/28

## 2021-04-29 NOTE — Patient Instructions (Signed)
Access Code: [facility] URL: [email or web address] Date: 04/29/2021 Prepared by: Gillermo Murdoch  Exercises Supine Transversus Abdominis Bracing - Hands on Stomach - 1 x daily - 7 x weekly - 2 sets - 10 reps - 5 sec hold Hooklying Clamshell with Resistance - 1 x daily - 7 x weekly - 2 sets - 10 reps Seated Hamstring Stretch - 1 x daily - 7 x weekly - 3 sets - 1 reps - 20-30 sec hold Seated Hip Flexor Stretch - 1 x daily - 7 x weekly - 3 sets - 1 reps - 20-30 sec hold Hooklying Hamstring Stretch with Strap - 2 x daily - 7 x weekly - 1 sets - 3 reps - 30 sec hold Supine ITB Stretch with Strap - 2 x daily - 7 x weekly - 1 sets - 3 reps - 30 sec hold Supine Piriformis Stretch with Leg Straight - 2 x daily - 7 x weekly - 1 sets - 3 reps - 30 sec hold Single Leg Stance with Support - 2 x daily - 7 x weekly - 1 sets - 3 reps - 10-20 sec hold Wall Quarter Squat - 2 x daily - 7 x weekly - 1-2 sets - 10 reps - 5-10 sec hold Shoulder External Rotation and Scapular Retraction with Resistance - 2 x daily - 7 x weekly - 3 sets - 10 reps Scapular Retraction with Resistance - 2 x daily - 7 x weekly - 3 sets - 10 reps Scapular Retraction with Resistance Advanced - 2 x daily - 7 x weekly - 3 sets - 10 reps Anti-Rotation Lateral Stepping with Press - 2 x daily - 7 x weekly - 1-2 sets - 10 reps - 2-3 sec hold Standing Terminal Knee Extension with Resistance - 2 x daily - 7 x weekly - 2-3 sets - 10 reps - 3-5 sec hold Single Leg Stance - 2 x daily - 7 x weekly - 2 sets - 5 reps - 20 sec hold The Diver - 2 x daily - 7 x weekly - 1 sets - 10 reps - 2-3 sec hold Single Leg Stance - 2 x daily - 7 x weekly - 1 sets - 5 reps - 20 sec hold Step Up - 2 x daily - 7 x weekly - 1 sets - 10 reps - 2 sec hold Supine Hip Adduction Isometric with Ball - 2 x daily - 7 x weekly - 1 sets - 10 reps - 5 sec hold Prone Gluteal Sets - 2 x daily - 7 x weekly - 1 sets - 10 reps - 5 sec hold

## 2021-04-30 ENCOUNTER — Other Ambulatory Visit: Payer: Self-pay | Admitting: Family Medicine

## 2021-04-30 ENCOUNTER — Other Ambulatory Visit: Payer: Self-pay | Admitting: Cardiovascular Disease

## 2021-04-30 DIAGNOSIS — E039 Hypothyroidism, unspecified: Secondary | ICD-10-CM

## 2021-05-02 ENCOUNTER — Encounter: Payer: Self-pay | Admitting: Rehabilitative and Restorative Service Providers"

## 2021-05-02 ENCOUNTER — Ambulatory Visit (INDEPENDENT_AMBULATORY_CARE_PROVIDER_SITE_OTHER): Payer: Medicare Other | Admitting: Rehabilitative and Restorative Service Providers"

## 2021-05-02 ENCOUNTER — Other Ambulatory Visit: Payer: Self-pay

## 2021-05-02 DIAGNOSIS — R29898 Other symptoms and signs involving the musculoskeletal system: Secondary | ICD-10-CM | POA: Diagnosis not present

## 2021-05-02 DIAGNOSIS — M5441 Lumbago with sciatica, right side: Secondary | ICD-10-CM

## 2021-05-02 DIAGNOSIS — R293 Abnormal posture: Secondary | ICD-10-CM | POA: Diagnosis not present

## 2021-05-02 NOTE — Patient Instructions (Signed)
Access Code: [facility] URL: [email or web address] Date: 05/02/2021 Prepared by: Gillermo Murdoch  Exercises Supine Transversus Abdominis Bracing - Hands on Stomach - 1 x daily - 7 x weekly - 2 sets - 10 reps - 5 sec hold Hooklying Clamshell with Resistance - 1 x daily - 7 x weekly - 2 sets - 10 reps Seated Hamstring Stretch - 1 x daily - 7 x weekly - 3 sets - 1 reps - 20-30 sec hold Seated Hip Flexor Stretch - 1 x daily - 7 x weekly - 3 sets - 1 reps - 20-30 sec hold Hooklying Hamstring Stretch with Strap - 2 x daily - 7 x weekly - 1 sets - 3 reps - 30 sec hold Supine ITB Stretch with Strap - 2 x daily - 7 x weekly - 1 sets - 3 reps - 30 sec hold Supine Piriformis Stretch with Leg Straight - 2 x daily - 7 x weekly - 1 sets - 3 reps - 30 sec hold Single Leg Stance with Support - 2 x daily - 7 x weekly - 1 sets - 3 reps - 10-20 sec hold Wall Quarter Squat - 2 x daily - 7 x weekly - 1-2 sets - 10 reps - 5-10 sec hold Shoulder External Rotation and Scapular Retraction with Resistance - 2 x daily - 7 x weekly - 3 sets - 10 reps Scapular Retraction with Resistance - 2 x daily - 7 x weekly - 3 sets - 10 reps Scapular Retraction with Resistance Advanced - 2 x daily - 7 x weekly - 3 sets - 10 reps Anti-Rotation Lateral Stepping with Press - 2 x daily - 7 x weekly - 1-2 sets - 10 reps - 2-3 sec hold Standing Terminal Knee Extension with Resistance - 2 x daily - 7 x weekly - 2-3 sets - 10 reps - 3-5 sec hold Single Leg Stance - 2 x daily - 7 x weekly - 2 sets - 5 reps - 20 sec hold The Diver - 2 x daily - 7 x weekly - 1 sets - 10 reps - 2-3 sec hold Single Leg Stance - 2 x daily - 7 x weekly - 1 sets - 5 reps - 20 sec hold Step Up - 2 x daily - 7 x weekly - 1 sets - 10 reps - 2 sec hold Supine Hip Adduction Isometric with Ball - 2 x daily - 7 x weekly - 1 sets - 10 reps - 5 sec hold Prone Gluteal Sets - 2 x daily - 7 x weekly - 1 sets - 10 reps - 5 sec hold Supine Bridge with Mini Swiss Ball  Between Knees - 1 x daily - 7 x weekly - 1-2 sets - 10 reps - 5 sec hold

## 2021-05-02 NOTE — Therapy (Signed)
[place] [address] [place], [place], [address] Phone: [phone]   Fax:  [phone]  Physical Therapy Treatment and Medicare 10th visit    Progress Note Reporting Period 03/30/21 to 05/02/21  See note below for Objective Data and Assessment of Progress/Goals.      Patient Details  Name: Anne [name] MRN: [id number] Date of Birth: 01/11/1951 Referring Provider (PT): Bo Merino   Encounter Date: 05/02/2021   PT End of Session - 05/02/21 1516     Visit Number 10    Number of Visits 12    Date for PT Re-Evaluation 05/25/21    Authorization Type medicare    Authorization - Visit Number 10    Progress Note Due on Visit 57    PT Start Time 5498    PT Stop Time 2641    PT Time Calculation (min) 43 min             Past Medical History:  Diagnosis Date   Acid reflux    Aortic atherosclerosis ([place]) 04/28/2013   CT 08/2017   Arthritis    "joints, hands" (08/30/2017)   Chronic lower back pain    Disc herniation    neck and low back   Family history of adverse reaction to anesthesia    "daughter w/PONV"   Follicular cystitis    Hiatal hernia    High cholesterol    Hyperlipidemia    ELEV CHOLESTEROL   Hypothyroidism    Osteopenia 2014   T score -1.6 FRAX 13%/1%   PAF (paroxysmal atrial fibrillation) ([place]) 08/30/2017   Echo 2/19: EF 55-60, normal wall motion, grade 1 diastolic dysfunction, mild PI, trivial TR //  Nuc 3/19: no ischemia or scar, EF 80 // CHADS2-VASc: 3 // Eliquis 5 bid    Schatzki's ring     Past Surgical History:  Procedure Laterality Date   BREAST BIOPSY Bilateral 1997   "excisional bx both benign"   BREAST CYST ASPIRATION Right ~ [place] Left 2011   CATARACT EXTRACTION W/ INTRAOCULAR LENS IMPLANT Right 12/20/2012   Dr. Haynes Bast, OD   COLONOSCOPY WITH ESOPHAGOGASTRODUODENOSCOPY (EGD)  "several"   "? dilated  esophagus" (08/30/2017)   DILATION AND CURETTAGE OF UTERUS  1996   EYE MUSCLE SURGERY Bilateral 1992   HERNIA REPAIR     HYSTEROSCOPY     LAPAROSCOPIC CHOLECYSTECTOMY  09/28/2011   OOPHORECTOMY Right 04/2000   LAP RSO/LYSIS OF ADHESIONS   THYROIDECTOMY  1976   TUBAL LIGATION  1976   VARICOSE VEIN SURGERY Bilateral 1972   VENTRAL HERNIA REPAIR  06/23/13   Dr. Franz Dell    There were no vitals filed for this visit.   Subjective Assessment - 05/02/21 1432     Subjective Did OK after last treatment. Using TENS at home and has been walking every other day for 20-35 min.    Currently in Pain? Yes    Pain Score 1     Pain Location Back    Pain Orientation Mid;Lower    Pain Descriptors / Indicators Nagging;Tightness    Pain Type Chronic pain                [facility] PT Assessment - 05/02/21 0001       Assessment   Medical Diagnosis lumbar spinal stenosis, Rt sided low back pain with Rt sciatica    Referring Provider (PT) Bo Merino    Onset Date/Surgical Date  02/24/21    Hand Dominance Right    Next MD Visit PRN    Prior Therapy yes for cervical      Flexibility   Hamstrings decreasing tightness    Piriformis decreasing tightness      Palpation   Spinal mobility hypomobile L3-L5    SI assessment  tight Lt> Rt sacral border    Palpation comment decreasing muscular tightness lumbar paraspinals; QL; piriformis Rt > Lt                           [facility] Adult PT Treatment/Exercise - 05/02/21 0001       Lumbar Exercises: Aerobic   Nustep L5 x 5 min      Lumbar Exercises: Standing   Lifting Limitations step ups 4" step x 10 Rt x 2 Lt - pain in Lt knee    Wall Slides 10 reps;5 seconds   with ball squeeze   Wall Slides Limitations small range due to Lt knee pain    Row Strengthening;Both;Theraband;5 reps    Theraband Level (Row) Level 3 (Green)    Row Limitations reactive isometric step back    Other Standing Lumbar Exercises SLS 20 sec x 4 reps  ea side decreased UE assist for balance; SLS with reaching up cabinet x 4 reps; SLS with head turns each side x 4 ; diver reaching forward to chair arms x 10 each side    Other Standing Lumbar Exercises antirotation green x 10 reps each side 3 sec hold - added shd flexion up down last 2 reps to incresae intensity      Lumbar Exercises: Supine   Bent Knee Raise Limitations adduction with ball btn knees 5 sec hold x 10 reps    Bridge with Ball Squeeze 10 reps;5 seconds      Lumbar Exercises: Prone   Other Prone Lumbar Exercises glut set 5 sec x 10      Manual Therapy   Soft tissue mobilization deep tissue work through through the sacrum and bilat sacral border; L> Rt lumbar musculature into the Rt posterior hip    Other Manual Therapy inferior mob through the sacrum with ~ 2 min hold                     PT Education - 05/02/21 1453     Education Details HEP    Person(s) Educated Patient    Methods Explanation;Demonstration;Tactile cues;Verbal cues;Handout    Comprehension Verbalized understanding;Returned demonstration;Verbal cues required;Tactile cues required                 PT Long Term Goals - 03/30/21 1206       PT LONG TERM GOAL #1   Title Pt will be independent in HEP    Time 6    Period Weeks    Status New    Target Date 05/25/21      PT LONG TERM GOAL #2   Title Pt will improve FOTO to >= 70 to demo improved functional mobility    Time 6    Period Weeks    Status New    Target Date 05/25/21      PT LONG TERM GOAL #3   Title Pt will improve lumbar ROM by 25% in all directions to decrease symptoms with transitions    Time 6    Period Weeks    Status New    Target Date 05/25/21  PT LONG TERM GOAL #4   Title Pt will tolerate standing x 30 minutes with symptoms <= 2/10    Time 6    Period Weeks    Status New    Target Date 05/25/21                   Plan - 05/02/21 1445     Clinical Impression Statement Minimal pain inthe LB  area. Patient has increased functional activity level and she is walking every other day for ~ 20-35 min. Continued tightness in the Lt > Rt lateral sacral border with spring testing and palpation. Tolerated increased exercises.    Rehab Potential Good    PT Frequency 2x / week    PT Duration 6 weeks    PT Treatment/Interventions Aquatic Therapy;Cryotherapy;Electrical Stimulation;Moist Heat;Traction;Iontophoresis 4mg /ml Dexamethasone;Therapeutic activities;Therapeutic exercise;Balance training;Neuromuscular re-education;Stair training;Gait training;Dry needling;Taping;Patient/family education;Manual techniques    PT Next Visit Plan progress HEP, progress core strength and lumbar flexibility; hold taping; does not want to try DN;  progress core strengthening exercises avoiding irritation of the Lt knee    PT Home Exercise Plan [id number]    Consulted and Agree with Plan of Care Patient             Patient will benefit from skilled therapeutic intervention in order to improve the following deficits and impairments:     Visit Diagnosis: Abnormal posture  Acute bilateral low back pain with right-sided sciatica  Other symptoms and signs involving the musculoskeletal system     Problem List Patient Active Problem List   Diagnosis Date Noted   Globus sensation 03/29/2021   Dry mouth 03/29/2020   Seborrheic dermatitis of scalp 01/22/2020   Cervical radiculopathy 08/25/2019   History of esophagogastroduodenoscopy (EGD) 02/08/2018   Colon cancer screening 02/08/2018   Essential hypertension 12/25/2017   GERD (gastroesophageal reflux disease) 08/30/2017   Chronic diastolic (congestive) heart failure ([place]) 08/30/2017   History of atrial fibrillation 08/30/2017   Flushing 07/30/2017   Paresthesia of both hands 07/30/2017   Postsurgical hypothyroidism 07/30/2017   Aortic atherosclerosis ([place]) 04/28/2013   S/P cataract extraction 01/16/2013   Pseudophakia of both eyes 12/20/2012    Schatzki's ring 10/16/2012   Spinal stenosis of lumbar region 08/16/2012   Vaginal atrophy 11/14/2011   Graves' disease 05/05/2011   Disc herniation    Osteopenia    PERSONAL HISTORY OF ALLERGY TO LATEX 12/23/2009   UNSPECIFIED DISEASE OF PHARYNX 12/16/2008   FATIGUE 11/10/2008   Intermittent palpitations 11/10/2008   Hypothyroidism 09/22/2008   Hyperlipidemia 09/22/2008   ALLERGIC RHINITIS 09/22/2008    Ryen Rhames Nilda Simmer, PT, MPH  05/02/2021, 3:18 PM  [facility] [place] [address] [place], [place], [address] Phone: [phone]   Fax:  [phone]  Name: Nekia Maxham MRN: [id number] Date of Birth: 03-28-1951

## 2021-05-03 ENCOUNTER — Encounter: Payer: Medicare Other | Admitting: Rehabilitative and Restorative Service Providers"

## 2021-05-05 ENCOUNTER — Other Ambulatory Visit: Payer: Self-pay

## 2021-05-05 ENCOUNTER — Encounter: Payer: Self-pay | Admitting: Rehabilitative and Restorative Service Providers"

## 2021-05-05 ENCOUNTER — Ambulatory Visit (INDEPENDENT_AMBULATORY_CARE_PROVIDER_SITE_OTHER): Payer: Medicare Other | Admitting: Rehabilitative and Restorative Service Providers"

## 2021-05-05 DIAGNOSIS — R293 Abnormal posture: Secondary | ICD-10-CM

## 2021-05-05 DIAGNOSIS — M5441 Lumbago with sciatica, right side: Secondary | ICD-10-CM

## 2021-05-05 DIAGNOSIS — R29818 Other symptoms and signs involving the nervous system: Secondary | ICD-10-CM

## 2021-05-05 DIAGNOSIS — R29898 Other symptoms and signs involving the musculoskeletal system: Secondary | ICD-10-CM

## 2021-05-05 NOTE — Therapy (Signed)
[place], [place], [address] Phone: [phone]   Fax:  [phone]  Physical Therapy Treatment  Patient Details  Name: Anne [name] MRN: [id number] Date of Birth: 1950-06-29 Referring Provider (PT): Bo Merino   Encounter Date: 05/05/2021   PT End of Session - 05/05/21 1015     Visit Number 11    Number of Visits 12    Date for PT Re-Evaluation 05/25/21    Authorization Type medicare    Authorization - Visit Number 11    Progress Note Due on Visit 60    PT Start Time 1014    PT Stop Time 1100    PT Time Calculation (min) 46 min    Activity Tolerance Patient tolerated treatment well             Past Medical History:  Diagnosis Date   Acid reflux    Aortic atherosclerosis ([place]) 04/28/2013   CT 08/2017   Arthritis    "joints, hands" (08/30/2017)   Chronic lower back pain    Disc herniation    neck and low back   Family history of adverse reaction to anesthesia    "daughter w/PONV"   Follicular cystitis    Hiatal hernia    High cholesterol    Hyperlipidemia    ELEV CHOLESTEROL   Hypothyroidism    Osteopenia 2014   T score -1.6 FRAX 13%/1%   PAF (paroxysmal atrial fibrillation) ([place]) 08/30/2017   Echo 2/19: EF 55-60, normal wall motion, grade 1 diastolic dysfunction, mild PI, trivial TR //  Nuc 3/19: no ischemia or scar, EF 80 // CHADS2-VASc: 3 // Eliquis 5 bid    Schatzki's ring     Past Surgical History:  Procedure Laterality Date   BREAST BIOPSY Bilateral 1997   "excisional bx both benign"   BREAST CYST ASPIRATION Right ~ [place] Left 2011   CATARACT EXTRACTION W/ INTRAOCULAR LENS IMPLANT Right 12/20/2012   Dr. Haynes Bast, OD   COLONOSCOPY WITH ESOPHAGOGASTRODUODENOSCOPY (EGD)  "several"   "? dilated esophagus" (08/30/2017)   DILATION AND CURETTAGE OF UTERUS  1996   EYE MUSCLE SURGERY Bilateral 1992    HERNIA REPAIR     HYSTEROSCOPY     LAPAROSCOPIC CHOLECYSTECTOMY  09/28/2011   OOPHORECTOMY Right 04/2000   LAP RSO/LYSIS OF ADHESIONS   THYROIDECTOMY  1976   TUBAL LIGATION  1976   VARICOSE VEIN SURGERY Bilateral 1972   VENTRAL HERNIA REPAIR  06/23/13   Dr. Franz Dell    There were no vitals filed for this visit.                      [place] Adult PT Treatment/Exercise - 05/05/21 0001       Lumbar Exercises: Stretches   Passive Hamstring Stretch Right;Left;30 seconds;2 reps    Passive Hamstring Stretch Limitations supine with strap    Hip Flexor Stretch Right;Left;2 reps;30 seconds    Hip Flexor Stretch Limitations seated    ITB Stretch Right;Left;2 reps;30 seconds    ITB Stretch Limitations supine with strap    Piriformis Stretch Right;Left;3 reps;30 seconds    Piriformis Stretch Limitations supine travell      Lumbar Exercises: Aerobic   Nustep L5 x 5 min      Lumbar Exercises: Standing   Lifting Limitations step ups 2" step x 10 Rt/Lt - minimal to no pain in Lt knee  Wall Slides 10 reps;5 seconds   with ball squeeze   Wall Slides Limitations small range due to Lt knee pain    Row Strengthening;Both;Theraband;5 reps    Theraband Level (Row) Level 3 (Green)    Row Limitations reactive isometric    Other Standing Lumbar Exercises SLS 20 sec x 4 reps ea side decreased UE assist for balance; SLS with reaching up cabinet x 4 reps; SLS with head turns each side x 4 ; diver reaching forward to chair arms x 10 each side    Other Standing Lumbar Exercises antirotation green x 10 reps each side 3 sec hold - added shd flexion up down last 2 reps      Lumbar Exercises: Supine   Bent Knee Raise Limitations adduction with ball btn knees 5 sec hold x 10 reps    Bridge with Ball Squeeze 10 reps;5 seconds                          PT Long Term Goals - 03/30/21 1206       PT LONG TERM GOAL #1   Title Pt will be independent in HEP    Time 6     Period Weeks    Status New    Target Date 05/25/21      PT LONG TERM GOAL #2   Title Pt will improve FOTO to >= 70 to demo improved functional mobility    Time 6    Period Weeks    Status New    Target Date 05/25/21      PT LONG TERM GOAL #3   Title Pt will improve lumbar ROM by 25% in [name] directions to decrease symptoms with transitions    Time 6    Period Weeks    Status New    Target Date 05/25/21      PT LONG TERM GOAL #4   Title Pt will tolerate standing x 30 minutes with symptoms <= 2/10    Time 6    Period Weeks    Status New    Target Date 05/25/21                   Plan - 05/05/21 1015     Clinical Impression Statement Patient reports that she is doing well today and did good yesterday. She is working on her exercises. Found a surface to do the little step ups that works for [employer]. Walking with her husband ~ 20 min around a track and has walked 2 days since she wsa last here. Continued with exercise/core stabilization exercise program.    Rehab Potential Good    PT Frequency 2x / week    PT Duration 6 weeks    PT Treatment/Interventions Aquatic Therapy;Cryotherapy;Electrical Stimulation;Moist Heat;Traction;Iontophoresis 4mg /ml Dexamethasone;Therapeutic activities;Therapeutic exercise;Balance training;Neuromuscular re-education;Stair training;Gait training;Dry needling;Taping;Patient/family education;Manual techniques    PT Next Visit Plan progress HEP, progress core strength and lumbar flexibility; hold taping; does not want to try DN;  progress core strengthening exercises avoiding irritation of the Lt knee    PT Home Exercise Plan [id number]    Consulted and Agree with Plan of Care Patient             Patient will benefit from skilled therapeutic intervention in order to improve the following deficits and impairments:     Visit Diagnosis: Abnormal posture  Acute bilateral low back pain with right-sided sciatica  Other symptoms and signs involving the  musculoskeletal  system  Other symptoms and signs involving the nervous system     Problem List Patient Active Problem List   Diagnosis Date Noted   Globus sensation 03/29/2021   Dry mouth 03/29/2020   Seborrheic dermatitis of scalp 01/22/2020   Cervical radiculopathy 08/25/2019   History of esophagogastroduodenoscopy (EGD) 02/08/2018   Colon cancer screening 02/08/2018   Essential hypertension 12/25/2017   GERD (gastroesophageal reflux disease) 08/30/2017   Chronic diastolic (congestive) heart failure ([place]) 08/30/2017   History of atrial fibrillation 08/30/2017   Flushing 07/30/2017   Paresthesia of both hands 07/30/2017   Postsurgical hypothyroidism 07/30/2017   Aortic atherosclerosis ([place]) 04/28/2013   S/P cataract extraction 01/16/2013   Pseudophakia of both eyes 12/20/2012   Schatzki's ring 10/16/2012   Spinal stenosis of lumbar region 08/16/2012   Vaginal atrophy 11/14/2011   Graves' disease 05/05/2011   Disc herniation    Osteopenia    PERSONAL HISTORY OF ALLERGY TO LATEX 12/23/2009   UNSPECIFIED DISEASE OF PHARYNX 12/16/2008   FATIGUE 11/10/2008   Intermittent palpitations 11/10/2008   Hypothyroidism 09/22/2008   Hyperlipidemia 09/22/2008   ALLERGIC RHINITIS 09/22/2008    Hobart Marte Nilda Simmer, PT, MPH  05/05/2021, 11:03 AM  [facility] [place] [address] [place], [place], [address] Phone: [phone]   Fax:  [phone]  Name: Ernestina Joe MRN: [id number] Date of Birth: May 05, 1951

## 2021-05-10 ENCOUNTER — Other Ambulatory Visit: Payer: Self-pay

## 2021-05-10 ENCOUNTER — Ambulatory Visit (INDEPENDENT_AMBULATORY_CARE_PROVIDER_SITE_OTHER): Payer: Medicare Other | Admitting: Rehabilitative and Restorative Service Providers"

## 2021-05-10 ENCOUNTER — Encounter: Payer: Self-pay | Admitting: Rehabilitative and Restorative Service Providers"

## 2021-05-10 DIAGNOSIS — R29898 Other symptoms and signs involving the musculoskeletal system: Secondary | ICD-10-CM

## 2021-05-10 DIAGNOSIS — R29818 Other symptoms and signs involving the nervous system: Secondary | ICD-10-CM | POA: Diagnosis not present

## 2021-05-10 DIAGNOSIS — R293 Abnormal posture: Secondary | ICD-10-CM

## 2021-05-10 DIAGNOSIS — M5441 Lumbago with sciatica, right side: Secondary | ICD-10-CM | POA: Diagnosis not present

## 2021-05-10 NOTE — Patient Instructions (Signed)
Access Code: [facility] URL: [email or web address] Date: 05/10/2021 Prepared by: Gillermo Murdoch  Exercises Supine Transversus Abdominis Bracing - Hands on Stomach - 1 x daily - 7 x weekly - 2 sets - 10 reps - 5 sec hold Hooklying Clamshell with Resistance - 1 x daily - 7 x weekly - 2 sets - 10 reps Seated Hamstring Stretch - 1 x daily - 7 x weekly - 3 sets - 1 reps - 20-30 sec hold Seated Hip Flexor Stretch - 1 x daily - 7 x weekly - 3 sets - 1 reps - 20-30 sec hold Hooklying Hamstring Stretch with Strap - 2 x daily - 7 x weekly - 1 sets - 3 reps - 30 sec hold Supine ITB Stretch with Strap - 2 x daily - 7 x weekly - 1 sets - 3 reps - 30 sec hold Supine Piriformis Stretch with Leg Straight - 2 x daily - 7 x weekly - 1 sets - 3 reps - 30 sec hold Single Leg Stance with Support - 2 x daily - 7 x weekly - 1 sets - 3 reps - 10-20 sec hold Wall Quarter Squat - 2 x daily - 7 x weekly - 1-2 sets - 10 reps - 5-10 sec hold Shoulder External Rotation and Scapular Retraction with Resistance - 2 x daily - 7 x weekly - 3 sets - 10 reps Scapular Retraction with Resistance - 2 x daily - 7 x weekly - 3 sets - 10 reps Scapular Retraction with Resistance Advanced - 2 x daily - 7 x weekly - 3 sets - 10 reps Anti-Rotation Lateral Stepping with Press - 2 x daily - 7 x weekly - 1-2 sets - 10 reps - 2-3 sec hold Standing Terminal Knee Extension with Resistance - 2 x daily - 7 x weekly - 2-3 sets - 10 reps - 3-5 sec hold Single Leg Stance - 2 x daily - 7 x weekly - 2 sets - 5 reps - 20 sec hold The Diver - 2 x daily - 7 x weekly - 1 sets - 10 reps - 2-3 sec hold Single Leg Stance - 2 x daily - 7 x weekly - 1 sets - 5 reps - 20 sec hold Step Up - 2 x daily - 7 x weekly - 1 sets - 10 reps - 2 sec hold Supine Hip Adduction Isometric with Ball - 2 x daily - 7 x weekly - 1 sets - 10 reps - 5 sec hold Prone Gluteal Sets - 2 x daily - 7 x weekly - 1 sets - 10 reps - 5 sec hold Supine Bridge with Mini Swiss Ball  Between Knees - 1 x daily - 7 x weekly - 1-2 sets - 10 reps - 5 sec hold Drawing Bow - 1 x daily - 7 x weekly - 1 sets - 10 reps - 3 sec hold

## 2021-05-10 NOTE — Therapy (Addendum)
[place], [place], [address] Phone: [phone]   Fax:  [phone]  Physical Therapy Treatment and Discharge Summary  PHYSICAL THERAPY DISCHARGE SUMMARY  Visits from Start of Care: 12  Current functional level related to goals / functional outcomes: See last progress note for discharge status    Remaining deficits: Some continued intermittent pain    Education / Equipment: HEP    Patient agrees to discharge. Patient goals were partially met. Patient is being discharged due to being pleased with the current functional level. Daneen Volcy P. Helene Kelp PT, MPH 07/06/21 3:27 PM   Patient Details  Name: Anne [name] MRN: [id number] Date of Birth: Apr 16, 1951 Referring Provider (PT): Bo Merino   Encounter Date: 05/10/2021   PT End of Session - 05/10/21 1014     Visit Number 12    Number of Visits 12    Date for PT Re-Evaluation 05/25/21    Authorization Type medicare    Authorization - Visit Number 12    Progress Note Due on Visit 20    PT Start Time 1013    PT Stop Time 1100    PT Time Calculation (min) 47 min    Activity Tolerance Patient tolerated treatment well             Past Medical History:  Diagnosis Date   Acid reflux    Aortic atherosclerosis ([place]) 04/28/2013   CT 08/2017   Arthritis    "joints, hands" (08/30/2017)   Chronic lower back pain    Disc herniation    neck and low back   Family history of adverse reaction to anesthesia    "daughter w/PONV"   Follicular cystitis    Hiatal hernia    High cholesterol    Hyperlipidemia    ELEV CHOLESTEROL   Hypothyroidism    Osteopenia 2014   T score -1.6 FRAX 13%/1%   PAF (paroxysmal atrial fibrillation) ([place]) 08/30/2017   Echo 2/19: EF 55-60, normal wall motion, grade 1 diastolic dysfunction, mild PI, trivial TR //  Nuc 3/19: no ischemia or scar, EF 80 // CHADS2-VASc: 3 // Eliquis 5 bid    Schatzki's ring      Past Surgical History:  Procedure Laterality Date   BREAST BIOPSY Bilateral 1997   "excisional bx both benign"   BREAST CYST ASPIRATION Right ~ [place] Left 2011   CATARACT EXTRACTION W/ INTRAOCULAR LENS IMPLANT Right 12/20/2012   Dr. Haynes Bast, OD   COLONOSCOPY WITH ESOPHAGOGASTRODUODENOSCOPY (EGD)  "several"   "? dilated esophagus" (08/30/2017)   DILATION AND CURETTAGE OF UTERUS  1996   EYE MUSCLE SURGERY Bilateral 1992   HERNIA REPAIR     HYSTEROSCOPY     LAPAROSCOPIC CHOLECYSTECTOMY  09/28/2011   OOPHORECTOMY Right 04/2000   LAP RSO/LYSIS OF ADHESIONS   THYROIDECTOMY  1976   TUBAL LIGATION  1976   VARICOSE VEIN SURGERY Bilateral 1972   VENTRAL HERNIA REPAIR  06/23/13   Dr. Franz Dell    There were no vitals filed for this visit.   Subjective Assessment - 05/10/21 1014     Subjective Back or hip pain; knee was hurting yesterday. Rode to [place] with her sister and brother in [employer] [place]. Working on exercises and walking program for home    Currently in Pain? No/denies    Pain Score 0-No pain  [place] Adult PT Treatment/Exercise - 05/10/21 0001       Lumbar Exercises: Aerobic   Nustep L5 x 6 min      Lumbar Exercises: Standing   Lifting Limitations step ups 2" step x 10 Rt/Lt x 2 sets - no pain in Lt knee    Wall Slides 10 reps;5 seconds   with ball squeeze   Wall Slides Limitations small range due to Lt knee pain    Row Strengthening;Both;Theraband;5 reps    Theraband Level (Row) Level 3 (Green)    Row Limitations reactive isometric    Shoulder Extension Strengthening;Both;15 reps;Theraband    Theraband Level (Shoulder Extension) Level 3 (Green)    Shoulder Extension Limitations bow and arrow green x 20 reps each side    Other Standing Lumbar Exercises SLS 20 sec x 4 reps ea side decreased UE assist for balance; SLS with reaching up cabinet x 4 reps; SLS with head turns  each side x 4 ; diver reaching forward to chair arms x 10 each side    Other Standing Lumbar Exercises antirotation green x 10 reps each side 3 sec hold - added shd flexion up down last 2 reps      Lumbar Exercises: Supine   Bent Knee Raise Limitations adduction with ball btn knees 5 sec hold x 10 reps    Bridge with Ball Squeeze 10 reps;5 seconds                     PT Education - 05/10/21 1035     Education Details HEP    Person(s) Educated Patient    Methods Explanation;Demonstration;Tactile cues;Verbal cues;Handout    Comprehension Verbalized understanding;Returned demonstration;Verbal cues required;Tactile cues required                 PT Long Term Goals - 05/10/21 1032       PT LONG TERM GOAL #1   Title Pt will be independent in HEP    Time 6    Status Achieved      PT LONG TERM GOAL #2   Title Pt will improve FOTO to >= 70 to demo improved functional mobility    Baseline 63    Time 6    Period Weeks    Status Partially Met      PT LONG TERM GOAL #3   Title Pt will improve lumbar ROM by 25% in all directions to decrease symptoms with transitions    Baseline -    Time 6    Period Weeks    Status Achieved      PT LONG TERM GOAL #4   Title Pt will tolerate standing x 30 minutes with symptoms <= 2/10    Time 6    Period Weeks    Status Achieved      PT LONG TERM GOAL #5   Title The patient will improve L rotation supine to > or equal to 45 degrees.    Baseline improved seated AROM rotation L to 70 degrees and R to 68 degrees    Time 6    Period Weeks    Status Achieved                   Plan - 05/10/21 1017     Clinical Impression Statement Continued progress with core stabilization and strengthening. Minimal to no pain in the back. Intermittent pain in the Lt knee. Patient demonstrates good gains in lumbar mobility/ROM and improved core strength  and stability. She has returned to all normal functional activities. She is confident  in continuing with independent HEP. Patient will call with any concerns or questions. Issued blue TB for home to increase resistance as indicated.    Rehab Potential Good    PT Frequency 2x / week    PT Duration 6 weeks    PT Treatment/Interventions Aquatic Therapy;Cryotherapy;Electrical Stimulation;Moist Heat;Traction;Iontophoresis 18m/ml Dexamethasone;Therapeutic activities;Therapeutic exercise;Balance training;Neuromuscular re-education;Stair training;Gait training;Dry needling;Taping;Patient/family education;Manual techniques    PT Next Visit Plan continue with independent HEP - call with questions or concerns    [place] Agree with Plan of Care Patient             Patient will benefit from skilled therapeutic intervention in order to improve the following deficits and impairments:     Visit Diagnosis: Abnormal posture  Acute bilateral low back pain with right-sided sciatica  Other symptoms and signs involving the musculoskeletal system  Other symptoms and signs involving the nervous system     Problem List Patient Active Problem List   Diagnosis Date Noted   Globus sensation 03/29/2021   Dry mouth 03/29/2020   Seborrheic dermatitis of scalp 01/22/2020   Cervical radiculopathy 08/25/2019   History of esophagogastroduodenoscopy (EGD) 02/08/2018   Colon cancer screening 02/08/2018   Essential hypertension 12/25/2017   GERD (gastroesophageal reflux disease) 08/30/2017   Ch[place]) [place] 08/30/2017   History of atrial fibrillation 08/30/2017   Flushing 07/30/2017   Paresthesia of both hands 07/30/2017   Postsurgical hypothyroidism 07/30/2017   [place] 04[place]   S/P cataract extraction 01/16/2013   Pseudophakia of both eyes 12/20/2012   Schatzki's ring 10/16/2012   Spinal stenosis of lumbar region 08/16/2012   Vaginal atrophy 11/14/2011   Graves' disease 05/05/2011    Disc herniation    Osteopenia    PERSONAL HISTORY OF ALLERGY TO LATEX 12/23/2009   UNSPECIFIED DISEASE OF PHARYNX 12/16/2008   FATIGUE 11/10/2008   Intermittent palpitations 11/10/2008   Hypothyroidism 09/22/2008   Hyperlipidemia 09/22/2008   ALLERGIC RHINITIS 09/22/2008    Anne [name] PNilda Simmer PT, MPH  05/10/2021, 10:52 AM  [facility] [address]Phone: [phone]  Fax:  [phone] Name: Anne CremeansMRN: [id number] of Birth: [birth year]

## 2021-05-12 ENCOUNTER — Encounter: Payer: Medicare Other | Admitting: Rehabilitative and Restorative Service Providers"

## 2021-05-16 ENCOUNTER — Encounter: Payer: Medicare Other | Admitting: Rehabilitative and Restorative Service Providers"

## 2021-05-17 ENCOUNTER — Encounter: Payer: Medicare Other | Admitting: Rehabilitative and Restorative Service Providers"

## 2021-06-22 ENCOUNTER — Telehealth: Payer: Self-pay

## 2021-06-22 MED ORDER — OSELTAMIVIR PHOSPHATE 75 MG PO CAPS: 75.0000 mg | ORAL_CAPSULE | Freq: Every day | ORAL | 0 refills | Status: DC

## 2021-06-22 NOTE — Telephone Encounter (Signed)
Patient called stating her husband tested positive for the flu today. Patient stated she is not having any symptoms at this time and is requesting for Tamiflu for prophylaxis. Per Dr. Madilyn Fireman, okay to send in Tamiflu 75 mg 1 tablet daily for 10 days. Patient advised prescription has been sent and verbalized understanding.

## 2021-07-05 ENCOUNTER — Other Ambulatory Visit: Payer: Self-pay

## 2021-07-05 ENCOUNTER — Encounter: Payer: Self-pay | Admitting: Family Medicine

## 2021-07-05 ENCOUNTER — Ambulatory Visit (INDEPENDENT_AMBULATORY_CARE_PROVIDER_SITE_OTHER): Payer: Medicare Other | Admitting: Family Medicine

## 2021-07-05 VITALS — BP 124/70 | HR 82 | Temp 98.4°F | Resp 18 | Ht 65.0 in | Wt 183.0 lb

## 2021-07-05 DIAGNOSIS — J069 Acute upper respiratory infection, unspecified: Secondary | ICD-10-CM | POA: Diagnosis not present

## 2021-07-05 DIAGNOSIS — R059 Cough, unspecified: Secondary | ICD-10-CM

## 2021-07-05 LAB — POCT INFLUENZA A/B
Influenza A, POC: NEGATIVE
Influenza B, POC: NEGATIVE

## 2021-07-05 NOTE — Progress Notes (Signed)
Acute Office Visit  Subjective:    Patient ID: Anne [name], female    DOB: [birth year], 71 y.o.   MRN: [id number]  Chief Complaint  Patient presents with   Cough    Productive cough, chills, fever for 2 days     HPI Patient is in today for cough, runny nose, chills and fever x 2-3 days.  No sore throat or ear pain.  No GI symptoms.  She had actually called on December 28 stating that her husband just tested positive for the flu so we actually started her on prophylactic Tamiflu which she did take she completed it this weekend but soon after stopping the medication started to develop these new symptoms.  Past Medical History:  Diagnosis Date   Acid reflux    Aortic atherosclerosis ([place]) 04/28/2013   CT 08/2017   Arthritis    "joints, hands" (08/30/2017)   Chronic lower back pain    Disc herniation    neck and low back   Family history of adverse reaction to anesthesia    "daughter w/PONV"   Follicular cystitis    Hiatal hernia    High cholesterol    Hyperlipidemia    ELEV CHOLESTEROL   Hypothyroidism    Osteopenia 2014   T score -1.6 FRAX 13%/1%   PAF (paroxysmal atrial fibrillation) ([place]) 08/30/2017   Echo 2/19: EF 55-60, normal wall motion, grade 1 diastolic dysfunction, mild PI, trivial TR //  Nuc 3/19: no ischemia or scar, EF 80 // CHADS2-VASc: 3 // Eliquis 5 bid    Schatzki's ring     Past Surgical History:  Procedure Laterality Date   BREAST BIOPSY Bilateral 1997   "excisional bx both benign"   BREAST CYST ASPIRATION Right ~ [place] Left 2011   CATARACT EXTRACTION W/ INTRAOCULAR LENS IMPLANT Right 12/20/2012   Dr. Haynes Bast, OD   COLONOSCOPY WITH ESOPHAGOGASTRODUODENOSCOPY (EGD)  "several"   "? dilated esophagus" (08/30/2017)   DILATION AND CURETTAGE OF UTERUS  1996   EYE MUSCLE SURGERY Bilateral 1992   HERNIA REPAIR     HYSTEROSCOPY     LAPAROSCOPIC CHOLECYSTECTOMY  09/28/2011   OOPHORECTOMY Right  04/2000   LAP RSO/LYSIS OF ADHESIONS   THYROIDECTOMY  1976   TUBAL LIGATION  1976   VARICOSE VEIN SURGERY Bilateral 1972   VENTRAL HERNIA REPAIR  06/23/13   Dr. Franz Dell    Family History  Problem Relation Age of Onset   Hypertension Mother    Heart attack Father 85   Heart disease Father    Anuerysm Father    Stroke Sister    Hypertension Sister    Arthritis Sister    Lupus Sister    Cancer Sister    Breast cancer Sister        Age 5   Rheum arthritis Sister    Stroke Sister    Cancer Brother 58       melanoma   Melanoma Brother    Prostate cancer Brother    Leukemia Brother    Pulmonary fibrosis Brother    Arthritis Daughter        Rheumatoid   Hyperparathyroidism Daughter     Social History   Socioeconomic History   Marital status: Married    Spouse name: [name]   Number of children: 1   Years of education: 10   Highest education level: GED or equivalent  Occupational History   Occupation: Teaching laboratory technician  Comment: retired  Tobacco Use   Smoking status: Former    Packs/day: 0.10    Years: 18.00    Pack years: 1.80    Types: Cigarettes    Quit date: 01/27/1979    Years since quitting: 42.4   Smokeless tobacco: Never  Vaping Use   Vaping Use: Never used  Substance and Sexual Activity   Alcohol use: Yes    Alcohol/week: 1.0 standard drink    Types: 1 Glasses of wine per week    Comment: 2 glasses of wine a month   Drug use: No   Sexual activity: Not Currently    Partners: Male    Birth control/protection: Post-menopausal  Other Topics Concern   Not on file  Social History Narrative   4 brothers and 6 sisters. All 4 brother deceased.  2 sisters deceased.  Tries to go the park to walk with her husband. Likes to [employer] and do crossword puzzles.   Social Determinants of Health   Financial Resource Strain: Low Risk    Difficulty of Paying Living Expenses: Not hard at all  Food Insecurity: No Food Insecurity   Worried About Charity fundraiser in  the Last Year: Never true   [place] in the Last Year: Never true  Transportation Needs: No Transportation Needs   Lack of Transportation (Medical): No   Lack of Transportation (Non-Medical): No  Physical Activity: Insufficiently Active   Days of Exercise per Week: 3 days   Minutes of Exercise per Session: 20 min  Stress: No Stress Concern Present   Feeling of Stress : Not at all  Social Connections: Moderately Isolated   Frequency of Communication with Friends and Family: More than three times a week   Frequency of Social Gatherings with Friends and Family: More than three times a week   Attends Religious Services: Never   Marine scientist or Organizations: No   Attends Music therapist: Never   Marital Status: Married  Human resources officer Violence: Not At Risk   Fear of Current or Ex-Partner: No   Emotionally Abused: No   Physically Abused: No   Sexually Abused: No    Outpatient Medications Prior to Visit  Medication Sig Dispense Refill   CALCIUM PO Take 1 tablet by mouth daily.      Cholecalciferol (VITAMIN D) 2000 UNITS tablet Take 2,000 Units by mouth daily.     clobetasol (TEMOVATE) 0.05 % external solution Apply 1 application topically 2 (two) times daily.     estradiol (ESTRACE) 0.1 MG/GM vaginal cream Place 8.88 Applicatorfuls vaginally as needed. For vaginal irritation 42.5 g 2   meloxicam (MOBIC) 15 MG tablet Take 1 tablet (15 mg total) by mouth daily. 90 tablet 1   metoprolol succinate (TOPROL-XL) 50 MG 24 hr tablet TAKE 1 AND 1/2 TABS DAILY TO = 75 MG DAILY 135 tablet 3   Multiple Vitamin (MULTIVITAMIN) tablet Take 1 tablet by mouth daily.     omeprazole (PRILOSEC) 40 MG capsule TAKE 1 CAPSULE (40 MG TOTAL) BY MOUTH DAILY. 90 capsule 1   simvastatin (ZOCOR) 40 MG tablet TAKE 1 TABLET BY MOUTH EVERY DAY 90 tablet 3   SYNTHROID 75 MCG tablet TAKE 1 TABLET BY MOUTH DAILY BEFORE BREAKFAST. 90 tablet 1   oseltamivir (TAMIFLU) 75 MG capsule Take 1  capsule (75 mg total) by mouth daily. 10 capsule 0   No facility-administered medications prior to visit.    Allergies  Allergen Reactions  Amoxicillin-Pot Clavulanate     REACTION: blisters in colon (Augmentin)   Ceftriaxone Sodium     REACTION: throat swells (ROCEPHIN)   Cephalexin    Codeine     REACTION: nausea   Molds & Smuts    Zofran [Ondansetron Hcl]     Headaches    Review of Systems     Objective:    Physical Exam Constitutional:      Appearance: She is well-developed.  HENT:     Head: Normocephalic and atraumatic.     Right Ear: External ear normal.     Left Ear: External ear normal.     Nose: Nose normal.  Eyes:     Conjunctiva/sclera: Conjunctivae normal.     Pupils: Pupils are equal, round, and reactive to light.  Neck:     Thyroid: No thyromegaly.  Cardiovascular:     Rate and Rhythm: Normal rate and regular rhythm.     Heart sounds: Normal heart sounds.  Pulmonary:     Effort: Pulmonary effort is normal.     Breath sounds: Normal breath sounds. No wheezing.  Musculoskeletal:     Cervical back: Neck supple.  Lymphadenopathy:     Cervical: No cervical adenopathy.  Skin:    General: Skin is warm and dry.  Neurological:     Mental Status: She is alert and oriented to person, place, and time.    BP 124/70    Pulse 82    Temp 98.4 F (36.9 C)    Resp 18    Ht 5' 5"  (1.651 m)    Wt 183 lb (83 kg)    LMP 09/08/2000    SpO2 98%    BMI 30.45 kg/m  Wt Readings from Last 3 Encounters:  07/05/21 183 lb (83 kg)  03/29/21 180 lb (81.6 kg)  03/08/21 182 lb 9.6 oz (82.8 kg)    Health Maintenance Due  Topic Date Due   TETANUS/TDAP  01/22/2020    There are no preventive care reminders to display for this patient.   Lab Results  Component Value Date   TSH 1.79 03/30/2021   Lab Results  Component Value Date   WBC 5.3 09/28/2020   HGB 14.0 09/28/2020   HCT 43.3 09/28/2020   MCV 86.9 09/28/2020   PLT 182 09/28/2020   Lab Results  Component  Value Date   NA 139 03/30/2021   K 4.7 03/30/2021   CO2 28 03/30/2021   GLUCOSE 119 (H) 03/30/2021   BUN 14 03/30/2021   CREATININE 0.95 03/30/2021   BILITOT 0.7 09/28/2020   ALKPHOS 81 08/30/2017   AST 18 09/28/2020   ALT 18 09/28/2020   PROT 6.8 09/28/2020   ALBUMIN 4.2 08/30/2017   CALCIUM 9.4 03/30/2021   ANIONGAP 13 08/30/2017   EGFR 64 03/30/2021   Lab Results  Component Value Date   CHOL 167 09/28/2020   Lab Results  Component Value Date   HDL 48 (L) 09/28/2020   Lab Results  Component Value Date   LDLCALC 96 09/28/2020   Lab Results  Component Value Date   TRIG 133 09/28/2020   Lab Results  Component Value Date   CHOLHDL 3.5 09/28/2020   Lab Results  Component Value Date   HGBA1C 5.3 02/19/2019       Assessment & Plan:   Problem List Items Addressed This Visit   None Visit Diagnoses     Cough, unspecified type    -  Primary   Relevant Orders  POCT Influenza A/B (Completed)   Viral upper respiratory tract infection          URI -most consistent with virus.  She had a negative home COVID test and negative for flu.  She has had symptoms for about 3 days at this point if she is not feeling better by the end of the week or suddenly feels worse then please give [place] call sooner.  Otherwise we will likely take 1 to 2 weeks for symptoms to resolve.  Continue symptomatic care.  No orders of the defined types were placed in this encounter.    Beatrice Lecher, MD

## 2021-07-08 ENCOUNTER — Telehealth: Payer: Self-pay

## 2021-07-08 MED ORDER — AZITHROMYCIN 250 MG PO TABS: ORAL_TABLET | ORAL | 0 refills | Status: AC

## 2021-07-08 NOTE — Telephone Encounter (Signed)
Patient called stating she is not feeling any better. Patient states she has a runny nose, nasal congestion and when she blows her nose it is green/bloody discharge. Forward to Dr. Madilyn Fireman for review.

## 2021-07-08 NOTE — Telephone Encounter (Signed)
Patient advised of antibiotic.

## 2021-07-15 ENCOUNTER — Other Ambulatory Visit: Payer: Self-pay | Admitting: Family Medicine

## 2021-07-15 DIAGNOSIS — Z1231 Encounter for screening mammogram for malignant neoplasm of breast: Secondary | ICD-10-CM

## 2021-08-01 ENCOUNTER — Other Ambulatory Visit: Payer: Self-pay | Admitting: Family Medicine

## 2021-08-24 NOTE — Progress Notes (Signed)
Office Visit Note  Patient: Anne [name]             Date of Birth: [birth year]           MRN: [id number]             PCP: Agapito Games, MD Referring: Agapito Games, * Visit Date: 09/06/2021 Occupation: @GUAROCC @  Subjective:  Pain in both hands  History of Present Illness: Danitra Swarey is a 71 y.o. female with history of osteoarthritis and anti-CCP positive.  Patient presents today with ongoing discomfort and stiffness in both hands.  She states that her pain is worse pursing in the morning.  She denies any joint swelling.  She states that her right middle and left ring finger continues to trigger intermittently but her symptoms have been mild.  She has been taking meloxicam 15 mg 1 tablet every other day for pain relief.  She continues to have occasional discomfort in the left knee which is exacerbated by climbing steps.  She went to physical therapy and has continued home exercises which have been helpful.  She denies any mechanical symptoms in her left knee joint at this time.  She denies any left knee joint swelling.  She is occasional discomfort in her lower back and takes Tylenol as needed for pain relief.   Activities of Daily Living:  Patient reports morning stiffness for 10 minutes.   Patient Reports nocturnal pain.  Difficulty dressing/grooming: Denies Difficulty climbing stairs: Reports Difficulty getting out of chair: Denies Difficulty using hands for taps, buttons, cutlery, and/or writing: Denies  Review of Systems  Constitutional:  Negative for fatigue.  HENT:  Positive for mouth dryness. Negative for mouth sores and nose dryness.   Eyes:  Positive for dryness. Negative for pain and itching.  Respiratory:  Negative for shortness of breath and difficulty breathing.   Cardiovascular:  Negative for chest pain and palpitations.  Gastrointestinal:  Negative for blood in stool, constipation and diarrhea.  Endocrine: Negative for increased  urination.  Genitourinary:  Negative for difficulty urinating.  Musculoskeletal:  Positive for joint pain, joint pain, joint swelling and morning stiffness. Negative for myalgias, muscle tenderness and myalgias.  Skin:  Negative for color change, rash and redness.  Allergic/Immunologic: Negative for susceptible to infections.  Neurological:  Negative for dizziness, numbness, headaches, memory loss and weakness.  Hematological:  Negative for bruising/bleeding tendency.  Psychiatric/Behavioral:  Negative for confusion.    PMFS History:  Patient Active Problem List   Diagnosis Date Noted   Globus sensation 03/29/2021   Dry mouth 03/29/2020   Seborrheic dermatitis of scalp 01/22/2020   Cervical radiculopathy 08/25/2019   History of esophagogastroduodenoscopy (EGD) 02/08/2018   Colon cancer screening 02/08/2018   Essential hypertension 12/25/2017   GERD (gastroesophageal reflux disease) 08/30/2017   Chronic diastolic (congestive) heart failure (HCC) 08/30/2017   History of atrial fibrillation 08/30/2017   Flushing 07/30/2017   Paresthesia of both hands 07/30/2017   Postsurgical hypothyroidism 07/30/2017   Aortic atherosclerosis ([facility]CC) 04/28/2013   S/P cataract extraction 01/16/2013   Pseudophakia of both eyes 12/20/2012   Schatzki's ring 10/16/2012   Spinal stenosis of lumbar region 08/16/2012   Vaginal atrophy 11/14/2011   Graves' disease 05/05/2011   Disc herniation    Osteopenia    PERSONAL HISTORY OF ALLERGY TO LATEX 12/23/2009   UNSPECIFIED DISEASE OF PHARYNX 12/16/2008   FATIGUE 11/10/2008   Intermittent palpitations 11/10/2008   Hypothyroidism 09/22/2008   Hyperlipidemia 09/22/2008   ALLERGIC RHINITIS  09/22/2008    Past Medical History:  Diagnosis Date   Acid reflux    Aortic atherosclerosis ([facility]) 04/28/2013   CT 08/2017   Arthritis    "joints, hands" (08/30/2017)   Chronic lower back pain    Disc herniation    neck and low back   Family history of adverse reaction to  anesthesia    "daughter w/PONV"   Follicular cystitis    Hiatal hernia    High cholesterol    Hyperlipidemia    ELEV CHOLESTEROL   Hypothyroidism    Osteopenia 2014   T score -1.6 FRAX 13%/1%   PAF (paroxysmal atrial fibrillation) ([facility]CC) 08/30/2017   Echo 2/19: EF 55-60, normal wall motion, grade 1 diastolic dysfunction, mild PI, trivial TR //  Nuc 3/19: no ischemia or scar, EF 80 // CHADS2-VASc: 3 // Eliquis 5 bid    Schatzki's ring     Family History  Problem Relation Age of Onset   Hypertension Mother    Heart attack Father 35   Heart disease Father    Anuerysm Father    Stroke Sister    Hypertension Sister    Arthritis Sister    Lupus Sister    Cancer Sister    Breast cancer Sister        Age 29   Rheum arthritis Sister    Stroke Sister    Cancer Brother 85       melanoma   Melanoma Brother    Prostate cancer Brother    Leukemia Brother    Pulmonary fibrosis Brother    Arthritis Daughter        Rheumatoid   Hyperparathyroidism Daughter    Past Surgical History:  Procedure Laterality Date   BREAST BIOPSY Bilateral 1997   "excisional bx both benign"   BREAST CYST ASPIRATION Right ~ 1993   CATARACT EXTRACTION W/ INTRAOCULAR LENS IMPLANT Left 2011   CATARACT EXTRACTION W/ INTRAOCULAR LENS IMPLANT Right 12/20/2012   Dr. Areta Haber, OD   COLONOSCOPY WITH ESOPHAGOGASTRODUODENOSCOPY (EGD)  "several"   "? dilated esophagus" (08/30/2017)   DILATION AND CURETTAGE OF UTERUS  1996   EYE MUSCLE SURGERY Bilateral 1992   HERNIA REPAIR     HYSTEROSCOPY     LAPAROSCOPIC CHOLECYSTECTOMY  09/28/2011   OOPHORECTOMY Right 04/2000   LAP RSO/LYSIS OF ADHESIONS   THYROIDECTOMY  1976   TUBAL LIGATION  1976   VARICOSE VEIN SURGERY Bilateral 1972   VENTRAL HERNIA REPAIR  06/23/13   Dr. Gennette Pac   Social History   Social History Narrative   4 brothers and 6 sisters. All 4 brother deceased.  2 sisters deceased.  Tries to go the park to walk with her husband. Likes to [employer]  and do crossword puzzles.   Immunization History  Administered Date(s) Administered   Influenza Split 06/02/2011, 05/03/2012   Influenza Whole 04/16/2009   Influenza,inj,Quad PF,6+ Mos 05/01/2013, 04/17/2014   Pneumococcal Conjugate-13 10/17/2013   Pneumococcal Polysaccharide-23 02/04/2016   Td 01/21/2010   Zoster, Live 08/18/2011     Objective: Vital Signs: BP 138/79 (BP Location: Left Arm, Patient Position: Sitting, Cuff Size: Normal)   Pulse 82   Ht 5\' 5"  (1.651 m)   Wt 184 lb 3.2 oz (83.6 kg)   LMP 09/08/2000   BMI 30.65 kg/m    Physical Exam Vitals and nursing note reviewed.  Constitutional:      Appearance: She is well-developed.  HENT:     Head: Normocephalic and atraumatic.  Eyes:  Conjunctiva/sclera: Conjunctivae normal.  Cardiovascular:     Rate and Rhythm: Normal rate and regular rhythm.     Heart sounds: Normal heart sounds.  Pulmonary:     Effort: Pulmonary effort is normal.     Breath sounds: Normal breath sounds.  Abdominal:     General: Bowel sounds are normal.     Palpations: Abdomen is soft.  Musculoskeletal:     Cervical back: Normal range of motion.  Lymphadenopathy:     Cervical: No cervical adenopathy.  Skin:    General: Skin is warm and dry.     Capillary Refill: Capillary refill takes less than 2 seconds.  Neurological:     Mental Status: She is alert and oriented to person, place, and time.  Psychiatric:        Behavior: Behavior normal.     Musculoskeletal Exam: C-spine, thoracic spine, lumbar spine have good range of motion.  Some midline spinal tenderness in the lumbar region.  Shoulder joints, elbow joints, wrist joints, MCPs, PIPs, DIPs have good range of motion with no synovitis.  She was able to make a complete fist bilaterally.  She has PIP and DIP thickening consistent with osteoarthritis of both hands.  Subluxation of the DIP of both index fingers noted.  Hip joints have good range of motion with some discomfort in the right  hip.  Tenderness palpation over the right trochanteric bursa.  Knee joints have good range of motion with no warmth or effusion.  Ankle joints have good range of motion with no tenderness or joint swelling.  CDAI Exam: CDAI Score: -- Patient Global: --; Provider Global: -- Swollen: --; Tender: -- Joint Exam 09/06/2021   No joint exam has been documented for this visit   There is currently no information documented on the homunculus. Go to the Rheumatology activity and complete the homunculus joint exam.  Investigation: No additional findings.  Imaging: No results found.  Recent Labs: Lab Results  Component Value Date   WBC 5.3 09/28/2020   HGB 14.0 09/28/2020   PLT 182 09/28/2020   NA 139 03/30/2021   K 4.7 03/30/2021   CL 104 03/30/2021   CO2 28 03/30/2021   GLUCOSE 119 (H) 03/30/2021   BUN 14 03/30/2021   CREATININE 0.95 03/30/2021   BILITOT 0.7 09/28/2020   ALKPHOS 81 08/30/2017   AST 18 09/28/2020   ALT 18 09/28/2020   PROT 6.8 09/28/2020   ALBUMIN 4.2 08/30/2017   CALCIUM 9.4 03/30/2021   GFRAA 70 09/28/2020    Speciality Comments: No specialty comments available.  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  No synovitis was noted.  She has been experiencing discomfort in both hands due to underlying osteoarthritis.  She has PIP and DIP thickening consistent with osteoarthritic changes.  She has some CMC joint prominence bilaterally.  Discussed the diagnosis of osteoarthritis.  Discussed that if her symptoms persist or worsen I would recommend updating x-rays along with an ultrasound to assess for synovitis.  In the meantime  she should focus on joint protection and muscle strengthening.  Handout of hand exercises were provided to the patient.  She was advised to notify us if she develops any new or  worsening symptoms.  Primary osteoarthritis of both hands - She has severe osteoarthritis in her hands with subluxation of DIP joints.  Clinical findings are consistent with osteoarthritis.  No inflammation was noted on examination today.  No clinical features of rheumatoid arthritis at this time.  Discussed the importance of joint protection and muscle strengthening.  She was given a handout of hand exercises to perform along with jar grippers.  Primary osteoarthritis of both knees - Bilateral mild osteoarthritis and moderate chondromalacia patella: She has good range of motion of both knee joints on examination today.  She experiences intermittent discomfort in the left knee especially when climbing steps.  She completed physical therapy and has been performing home exercises.  Discussed the importance of ongoing lower extremity muscle strengthening.  On examination she has good range of motion of both knee joints with no warmth or effusion.  Discussed the importance of continuing home exercises. She was advised to notify us if she develops increased joint pain or joint swelling.   Trigger finger, left ring finger - Intermittent and mild triggering at times.  Trigger finger, right middle finger - She has intermittent triggering but overall her symptoms have been tolerable and intermittent.  DDD (degenerative disc disease), cervical: C-spine has good range of motion with no discomfort.  No symptoms of radiculopathy at this time.  Chronic right-sided low back pain with right-sided sciatica - x-ray showed severe levoscoliosis, multilevel spondylosis L4-L5 listhesis and facet joint arthropathy.  She experiences intermittent discomfort in her lower back.  No symptoms of radiculopathy.  She takes Tylenol at bedtime as needed for  symptomatic relief.  Pain in right hip - X-ray of the hip joint was unremarkable.  She has good range of motion of the right hip joint on examination today with some discomfort.  Tenderness over the right trochanter bursa was noted.  Osteopenia of multiple sites - DEXA 08/06/2019: The BMD measured at Forearm Radius 33% is 0.675 g/cm2 with a T-score of -2.3.  Due to update bone density-ordered by PCP.  She is taking a calcium and vitamin D supplement daily. No recent falls or fractures.  Other medical conditions are listed as follows:  Chronic diastolic (congestive) heart failure (HCC)  Paroxysmal atrial fibrillation (HCC)  Essential hypertension: Blood pressure was 138/79 today in the office.  History of hyperlipidemia  Aortic atherosclerosis (HCC)  Postsurgical hypothyroidism  History of gastroesophageal reflux (GERD)  Family history of rheumatoid arthritis  Former smoker  Orders: No orders of the defined types were placed in this encounter.  No orders of the defined types were placed in this encounter.   Follow-Up Instructions: Return in about 6 months (around 03/09/2022) for Osteoarthritis.   Gearldine Bienenstock, PA-C  Note - This record has been created using Dragon software.  Chart creation errors have been sought, but may not always  have been located. Such creation errors do not reflect on  the standard of medical care.

## 2021-09-06 ENCOUNTER — Encounter: Payer: Self-pay | Admitting: Physician Assistant

## 2021-09-06 ENCOUNTER — Ambulatory Visit (INDEPENDENT_AMBULATORY_CARE_PROVIDER_SITE_OTHER): Payer: Medicare Other | Admitting: Physician Assistant

## 2021-09-06 ENCOUNTER — Other Ambulatory Visit: Payer: Self-pay

## 2021-09-06 VITALS — BP 138/79 | HR 82 | Ht 65.0 in | Wt 184.2 lb

## 2021-09-06 DIAGNOSIS — M65331 Trigger finger, right middle finger: Secondary | ICD-10-CM | POA: Diagnosis not present

## 2021-09-06 DIAGNOSIS — M8589 Other specified disorders of bone density and structure, multiple sites: Secondary | ICD-10-CM

## 2021-09-06 DIAGNOSIS — M65342 Trigger finger, left ring finger: Secondary | ICD-10-CM | POA: Diagnosis not present

## 2021-09-06 DIAGNOSIS — M5030 Other cervical disc degeneration, unspecified cervical region: Secondary | ICD-10-CM

## 2021-09-06 DIAGNOSIS — M5441 Lumbago with sciatica, right side: Secondary | ICD-10-CM | POA: Diagnosis not present

## 2021-09-06 DIAGNOSIS — I10 Essential (primary) hypertension: Secondary | ICD-10-CM

## 2021-09-06 DIAGNOSIS — R768 Other specified abnormal immunological findings in serum: Secondary | ICD-10-CM | POA: Diagnosis not present

## 2021-09-06 DIAGNOSIS — M19041 Primary osteoarthritis, right hand: Secondary | ICD-10-CM | POA: Diagnosis not present

## 2021-09-06 DIAGNOSIS — G8929 Other chronic pain: Secondary | ICD-10-CM

## 2021-09-06 DIAGNOSIS — M170 Bilateral primary osteoarthritis of knee: Secondary | ICD-10-CM

## 2021-09-06 DIAGNOSIS — E890 Postprocedural hypothyroidism: Secondary | ICD-10-CM

## 2021-09-06 DIAGNOSIS — Z87891 Personal history of nicotine dependence: Secondary | ICD-10-CM

## 2021-09-06 DIAGNOSIS — M25551 Pain in right hip: Secondary | ICD-10-CM

## 2021-09-06 DIAGNOSIS — Z8261 Family history of arthritis: Secondary | ICD-10-CM

## 2021-09-06 DIAGNOSIS — I700 Atherosclerosis of aorta: Secondary | ICD-10-CM

## 2021-09-06 DIAGNOSIS — M48061 Spinal stenosis, lumbar region without neurogenic claudication: Secondary | ICD-10-CM

## 2021-09-06 DIAGNOSIS — I480 Paroxysmal atrial fibrillation: Secondary | ICD-10-CM | POA: Diagnosis not present

## 2021-09-06 DIAGNOSIS — M19042 Primary osteoarthritis, left hand: Secondary | ICD-10-CM

## 2021-09-06 DIAGNOSIS — I5032 Chronic diastolic (congestive) heart failure: Secondary | ICD-10-CM | POA: Diagnosis not present

## 2021-09-06 DIAGNOSIS — Z8639 Personal history of other endocrine, nutritional and metabolic disease: Secondary | ICD-10-CM

## 2021-09-06 DIAGNOSIS — Z8719 Personal history of other diseases of the digestive system: Secondary | ICD-10-CM

## 2021-09-06 NOTE — Patient Instructions (Signed)
Hand Exercises Hand exercises can be helpful for almost anyone. These exercises can strengthen the hands, improve flexibility and movement, and increase blood flow to the hands. These results can make work and daily tasks easier. Hand exercises can be especially helpful for people who have joint pain from arthritis or have nerve damage from overuse (carpal tunnel syndrome). These exercises can also help people who have injured a hand. Exercises Most of these hand exercises are gentle stretching and motion exercises. It is usually safe to do them often throughout the day. Warming up your hands before exercise may help to reduce stiffness. You can do this with gentle massage or by placing your hands in warm water for 10-15 minutes. It is normal to feel some stretching, pulling, tightness, or mild discomfort as you begin new exercises. This will gradually improve. Stop an exercise right away if you feel sudden, severe pain or your pain gets worse. Ask your health care provider which exercises are best for you. Knuckle bend or "claw" fist  Stand or sit with your arm, hand, and all five fingers pointed straight up. Make sure to keep your wrist straight during the exercise. Gently bend your fingers down toward your palm until the tips of your fingers are touching the top of your palm. Keep your big knuckle straight and just bend the small knuckles in your fingers. Hold this position for __________ seconds. Straighten (extend) your fingers back to the starting position. Repeat this exercise 5-10 times with each hand. Full finger fist  Stand or sit with your arm, hand, and all five fingers pointed straight up. Make sure to keep your wrist straight during the exercise. Gently bend your fingers into your palm until the tips of your fingers are touching the middle of your palm. Hold this position for __________ seconds. Extend your fingers back to the starting position, stretching every joint fully. Repeat  this exercise 5-10 times with each hand. Straight fist Stand or sit with your arm, hand, and all five fingers pointed straight up. Make sure to keep your wrist straight during the exercise. Gently bend your fingers at the big knuckle, where your fingers meet your hand, and the middle knuckle. Keep the knuckle at the tips of your fingers straight and try to touch the bottom of your palm. Hold this position for __________ seconds. Extend your fingers back to the starting position, stretching every joint fully. Repeat this exercise 5-10 times with each hand. Tabletop  Stand or sit with your arm, hand, and all five fingers pointed straight up. Make sure to keep your wrist straight during the exercise. Gently bend your fingers at the big knuckle, where your fingers meet your hand, as far down as you can while keeping the small knuckles in your fingers straight. Think of forming a tabletop with your fingers. Hold this position for __________ seconds. Extend your fingers back to the starting position, stretching every joint fully. Repeat this exercise 5-10 times with each hand. Finger spread  Place your hand flat on a table with your palm facing down. Make sure your wrist stays straight as you do this exercise. Spread your fingers and thumb apart from each other as far as you can until you feel a gentle stretch. Hold this position for __________ seconds. Bring your fingers and thumb tight together again. Hold this position for __________ seconds. Repeat this exercise 5-10 times with each hand. Making circles  Stand or sit with your arm, hand, and all five fingers pointed   straight up. Make sure to keep your wrist straight during the exercise. Make a circle by touching the tip of your thumb to the tip of your index finger. Hold for __________ seconds. Then open your hand wide. Repeat this motion with your thumb and each finger on your hand. Repeat this exercise 5-10 times with each hand. Thumb  motion  Sit with your forearm resting on a table and your wrist straight. Your thumb should be facing up toward the ceiling. Keep your fingers relaxed as you move your thumb. Lift your thumb up as high as you can toward the ceiling. Hold for __________ seconds. Bend your thumb across your palm as far as you can, reaching the tip of your thumb for the small finger (pinkie) side of your palm. Hold for __________ seconds. Repeat this exercise 5-10 times with each hand. Grip strengthening  Hold a stress ball or other soft ball in the middle of your hand. Slowly increase the pressure, squeezing the ball as much as you can without causing pain. Think of bringing the tips of your fingers into the middle of your palm. All of your finger joints should bend when doing this exercise. Hold your squeeze for __________ seconds, then relax. Repeat this exercise 5-10 times with each hand. Contact a health care provider if: Your hand pain or discomfort gets much worse when you do an exercise. Your hand pain or discomfort does not improve within 2 hours after you exercise. If you have any of these problems, stop doing these exercises right away. Do not do them again unless your health care provider says that you can. Get help right away if: You develop sudden, severe hand pain or swelling. If this happens, stop doing these exercises right away. Do not do them again unless your health care provider says that you can. This information is not intended to replace advice given to you by your health care provider. Make sure you discuss any questions you have with your health care provider. Document Revised: 09/30/2020 Document Reviewed: 09/30/2020 Elsevier Patient Education  2022 Elsevier Inc.  

## 2021-09-27 ENCOUNTER — Ambulatory Visit: Payer: Medicare Other | Admitting: Family Medicine

## 2021-10-04 ENCOUNTER — Encounter: Payer: Self-pay | Admitting: Family Medicine

## 2021-10-04 ENCOUNTER — Ambulatory Visit (INDEPENDENT_AMBULATORY_CARE_PROVIDER_SITE_OTHER): Payer: Medicare Other | Admitting: Family Medicine

## 2021-10-04 VITALS — BP 124/61 | HR 80 | Resp 16 | Ht 65.0 in | Wt 182.0 lb

## 2021-10-04 DIAGNOSIS — I872 Venous insufficiency (chronic) (peripheral): Secondary | ICD-10-CM | POA: Diagnosis not present

## 2021-10-04 DIAGNOSIS — E039 Hypothyroidism, unspecified: Secondary | ICD-10-CM

## 2021-10-04 DIAGNOSIS — R208 Other disturbances of skin sensation: Secondary | ICD-10-CM

## 2021-10-04 DIAGNOSIS — M8589 Other specified disorders of bone density and structure, multiple sites: Secondary | ICD-10-CM | POA: Diagnosis not present

## 2021-10-04 DIAGNOSIS — I10 Essential (primary) hypertension: Secondary | ICD-10-CM | POA: Diagnosis not present

## 2021-10-04 NOTE — Progress Notes (Signed)
? ?Established Patient Office Visit ? ?Subjective:  ?Patient ID: Anne [name], female    DOB: May 31, 1951  Age: 71 y.o. MRN: [id number] ? ?CC:  ?Chief Complaint  ?Patient presents with  ? Hypertension  ?  Follow up   ? Leg Swelling  ?  Left leg swelling off and on for 1 year. Patient states she also has some discoloration.  ? Foot Burning  ?  Patient states she has been having burning and tingling in her feet for 1 year.   ? ? ?HPI ?[name] presents for  ? ?Hypertension- Pt denies chest pain, SOB, dizziness, or heart palpitations.  Taking meds as directed w/o problems.  Denies medication side effects.   ? ?Hypothyroidism - Taking medication regularly in the AM away from food and vitamins, etc. No recent change to skin, hair. Dec in energy level.  ? ?She also recently had a consultation with rheumatology for elevated ANA.  They diagnosed her with osteoarthritis they did not feel like her symptoms were consistent with rheumatoid or lupus.  They are seeing her back in 6 months.  They did recommend that we get her bone density test updated since it has been 2 years. ? ?She also reports some burning and tingling in both feet.  But she is also noticed some swelling particularly on the left lower leg as well as some discoloration of the skin it bothers her most when she has been standing on her feet for a long period of time for example walking in [employer] or standing and doing dishes.  She says if she sits down and rests and props her feet up it usually goes away.  It never bothers her at night.  He does notice that her left foot and toes look a little red and swollen when it happens.  She does been wearing compression stockings and says that does seem to help. ? ? ?Past Medical History:  ?Diagnosis Date  ? Acid reflux   ? Aortic atherosclerosis ([place]) 04/28/2013  ? CT 08/2017  ? Arthritis   ? "joints, hands" (08/30/2017)  ? Chronic lower back pain   ? Disc herniation   ? neck and low back  ? Family  history of adverse reaction to anesthesia   ? "daughter w/PONV"  ? Follicular cystitis   ? Hiatal hernia   ? High cholesterol   ? Hyperlipidemia   ? ELEV CHOLESTEROL  ? Hypothyroidism   ? Osteopenia 2014  ? T score -1.6 FRAX 13%/1%  ? PAF (paroxysmal atrial fibrillation) ([place]) 08/30/2017  ? Echo 2/19: EF 55-60, normal wall motion, grade 1 diastolic dysfunction, mild PI, trivial TR //  Nuc 3/19: no ischemia or scar, EF 80 // CHADS2-VASc: 3 // Eliquis 5 bid   ? Schatzki's ring   ? ? ?Past Surgical History:  ?Procedure Laterality Date  ? BREAST BIOPSY Bilateral 1997  ? "excisional bx both benign"  ? BREAST CYST ASPIRATION Right ~ 1993  ? CATARACT EXTRACTION W/ INTRAOCULAR LENS IMPLANT Left 2011  ? CATARACT EXTRACTION W/ INTRAOCULAR LENS IMPLANT Right 12/20/2012  ? Dr. Haynes Bast, OD  ? COLONOSCOPY WITH ESOPHAGOGASTRODUODENOSCOPY (EGD)  "several"  ? "? dilated esophagus" (08/30/2017)  ? [place] OF UTERUS  1996  ? EYE MUSCLE SURGERY Bilateral 1992  ? HERNIA REPAIR    ? HYSTEROSCOPY    ? LAPAROSCOPIC CHOLECYSTECTOMY  09/28/2011  ? OOPHORECTOMY Right 04/2000  ? LAP RSO/LYSIS OF ADHESIONS  ? THYROIDECTOMY  1976  ? TUBAL  LIGATION  1976  ? VARICOSE VEIN SURGERY Bilateral 1972  ? VENTRAL HERNIA REPAIR  06/23/13  ? Dr. Franz Dell  ? ? ?Family History  ?Problem Relation Age of Onset  ? Hypertension Mother   ? Heart attack Father 43  ? Heart disease Father   ? Anuerysm Father   ? Stroke Sister   ? Hypertension Sister   ? Arthritis Sister   ? Lupus Sister   ? Cancer Sister   ? Breast cancer Sister   ?     Age 20  ? Rheum arthritis Sister   ? Stroke Sister   ? Cancer Brother 65  ?     melanoma  ? Melanoma Brother   ? Prostate cancer Brother   ? Leukemia Brother   ? Pulmonary fibrosis Brother   ? Arthritis Daughter   ?     Rheumatoid  ? Hyperparathyroidism Daughter   ? ? ?Social History  ? ?Socioeconomic History  ? Marital status: Married  ?  Spouse name: [name]  ? Number of children: 1  ? Years of education: 10  ?  Highest education level: GED or equivalent  ?Occupational History  ? Occupation: Teaching laboratory technician  ?  Comment: retired  ?Tobacco Use  ? Smoking status: Former  ?  Packs/day: 0.10  ?  Years: 18.00  ?  Pack years: 1.80  ?  Types: Cigarettes  ?  Quit date: 01/27/1979  ?  Years since quitting: 42.7  ?  Passive exposure: Past  ? Smokeless tobacco: Never  ?Vaping Use  ? Vaping Use: Never used  ?Substance and Sexual Activity  ? Alcohol use: Yes  ?  Alcohol/week: 1.0 standard drink  ?  Types: 1 Glasses of wine per week  ?  Comment: 2 glasses of wine a month  ? Drug use: No  ? Sexual activity: Not Currently  ?  Partners: Male  ?  Birth control/protection: Post-menopausal  ?Other Topics Concern  ? Not on file  ?Social History Narrative  ? 4 brothers and 6 sisters. All 4 brother deceased.  2 sisters deceased.  Tries to go the park to walk with her husband. Likes to [employer] and do crossword puzzles.  ? ?Social Determinants of Health  ? ?Financial Resource Strain: Not on file  ?Food Insecurity: Not on file  ?Transportation Needs: Not on file  ?Physical Activity: Not on file  ?Stress: Not on file  ?Social Connections: Not on file  ?Intimate Partner Violence: Not on file  ? ? ?Outpatient Medications Prior to Visit  ?Medication Sig Dispense Refill  ? CALCIUM PO Take 1 tablet by mouth daily.     ? Cholecalciferol (VITAMIN D) 2000 UNITS tablet Take 2,000 Units by mouth daily.    ? clobetasol (TEMOVATE) 0.05 % external solution Apply 1 application topically 2 (two) times daily.    ? estradiol (ESTRACE) 0.1 MG/GM vaginal cream Place 7.61 Applicatorfuls vaginally as needed. For vaginal irritation 42.5 g 2  ? meloxicam (MOBIC) 15 MG tablet TAKE 1 TABLET (15 MG TOTAL) BY MOUTH DAILY. 90 tablet 0  ? metoprolol succinate (TOPROL-XL) 50 MG 24 hr tablet TAKE 1 AND 1/2 TABS DAILY TO = 75 MG DAILY 135 tablet 3  ? Multiple Vitamin (MULTIVITAMIN) tablet Take 1 tablet by mouth daily.    ? omeprazole (PRILOSEC) 40 MG capsule TAKE 1 CAPSULE (40 MG TOTAL) BY  MOUTH DAILY. 90 capsule 1  ? simvastatin (ZOCOR) 40 MG tablet TAKE 1 TABLET BY MOUTH EVERY DAY 90 tablet  0  ? SYNTHROID 75 MCG tablet TAKE 1 TABLET BY MOUTH DAILY BEFORE BREAKFAST. 90 tablet 1  ? ?No facility-administered medications prior to visit.  ? ? ?Allergies  ?Allergen Reactions  ? Amoxicillin-Pot Clavulanate   ?  REACTION: blisters in colon (Augmentin)  ? Ceftriaxone Sodium   ?  REACTION: throat swells (ROCEPHIN)  ? Cephalexin   ? Codeine   ?  REACTION: nausea  ? Molds & Smuts   ? Zofran [Ondansetron Hcl]   ?  Headaches  ? ? ?ROS ?Review of Systems ? ?  ?Objective:  ?  ?Physical Exam ?Vitals reviewed.  ?Constitutional:   ?   Appearance: Normal appearance. She is well-developed.  ?HENT:  ?   Head: Normocephalic and atraumatic.  ?Eyes:  ?   Conjunctiva/sclera: Conjunctivae normal.  ?Cardiovascular:  ?   Rate and Rhythm: Normal rate and regular rhythm.  ?   Heart sounds: Normal heart sounds.  ?Pulmonary:  ?   Effort: Pulmonary effort is normal.  ?   Breath sounds: Normal breath sounds.  ?Musculoskeletal:  ?   Comments: Trace edema around the left ankle.  Dorsal pedal pulse 2+ I did have difficulty palpating the posterior tibial.  No discoloration on exam today.  No rash or irritation.  She does have a lot of varicosities.  ?Skin: ?   General: Skin is warm and dry.  ?Neurological:  ?   Mental Status: She is alert and oriented to person, place, and time.  ?Psychiatric:     ?   Behavior: Behavior normal.  ? ? ?BP 124/61   Pulse 80   Resp 16   Ht '5\' 5"'$  (1.651 m)   Wt 182 lb (82.6 kg)   LMP 09/08/2000   SpO2 98%   BMI 30.29 kg/m?  ?Wt Readings from Last 3 Encounters:  ?10/04/21 182 lb (82.6 kg)  ?09/06/21 184 lb 3.2 oz (83.6 kg)  ?07/05/21 183 lb (83 kg)  ? ? ? ?There are no preventive care reminders to display for this patient. ? ? ?There are no preventive care reminders to display for this patient. ? ?Lab Results  ?Component Value Date  ? TSH 1.79 03/30/2021  ? ?Lab Results  ?Component Value Date  ? WBC 5.3  09/28/2020  ? HGB 14.0 09/28/2020  ? HCT 43.3 09/28/2020  ? MCV 86.9 09/28/2020  ? PLT 182 09/28/2020  ? ?Lab Results  ?Component Value Date  ? NA 139 03/30/2021  ? K 4.7 03/30/2021  ? CO2 28 03/30/2021  ? GLU

## 2021-10-04 NOTE — Assessment & Plan Note (Signed)
She has noticed a little bit more fatigue and may be some more skin dryness.  Plan to recheck TSH today. ?

## 2021-10-04 NOTE — Assessment & Plan Note (Signed)
Well controlled. Continue current regimen. Follow up in  6 mo  

## 2021-10-09 LAB — T4, FREE: Free T4: 1.2 ng/dL (ref 0.8–1.8)

## 2021-10-09 LAB — COMPLETE METABOLIC PANEL WITH GFR
AG Ratio: 1.5 (calc) (ref 1.0–2.5)
ALT: 13 U/L (ref 6–29)
AST: 16 U/L (ref 10–35)
Albumin: 4.4 g/dL (ref 3.6–5.1)
Alkaline phosphatase (APISO): 77 U/L (ref 37–153)
BUN: 19 mg/dL (ref 7–25)
CO2: 27 mmol/L (ref 20–32)
Calcium: 9.6 mg/dL (ref 8.6–10.4)
Chloride: 103 mmol/L (ref 98–110)
Creat: 0.88 mg/dL (ref 0.60–1.00)
Globulin: 2.9 g/dL (calc) (ref 1.9–3.7)
Glucose, Bld: 98 mg/dL (ref 65–99)
Potassium: 4.2 mmol/L (ref 3.5–5.3)
Sodium: 140 mmol/L (ref 135–146)
Total Bilirubin: 0.8 mg/dL (ref 0.2–1.2)
Total Protein: 7.3 g/dL (ref 6.1–8.1)
eGFR: 69 mL/min/{1.73_m2} (ref >=60)

## 2021-10-09 LAB — TSH: TSH: 0.92 mIU/L (ref 0.40–4.50)

## 2021-10-09 LAB — VITAMIN B1: Vitamin B1 (Thiamine): 10 nmol/L (ref 8–30)

## 2021-10-09 LAB — VITAMIN B12: Vitamin B-12: 533 pg/mL (ref 200–1100)

## 2021-10-09 LAB — T3, FREE: T3, Free: 2.6 pg/mL (ref 2.3–4.2)

## 2021-10-10 NOTE — Progress Notes (Signed)
Hi Anne [name], your thyroid levels are normal.  Metabolic panel looks great.  Vitamin B1 is normal.  And vitamin B12 is normal.

## 2021-10-12 ENCOUNTER — Ambulatory Visit (INDEPENDENT_AMBULATORY_CARE_PROVIDER_SITE_OTHER): Payer: Medicare Other

## 2021-10-12 DIAGNOSIS — M8589 Other specified disorders of bone density and structure, multiple sites: Secondary | ICD-10-CM

## 2021-10-12 DIAGNOSIS — M85832 Other specified disorders of bone density and structure, left forearm: Secondary | ICD-10-CM | POA: Diagnosis not present

## 2021-10-12 NOTE — Progress Notes (Signed)
Hi Anne [name], your bone density test shows a T score of -2.2.  This puts you into the osteopenia category. ? ? ?The current recommendation for osteopenia (mildly thin bones) treatment includes:  ? ?#1 calcium-total of 1200 mg of calcium daily.  If you eat a very calcium rich diet you may be able to obtain that without a supplement.  If not, then I recommend calcium 500 mg twice a day.  There are several products over-the-counter such as Caltrate D and Viactiv chews which are great options that contain calcium and vitamin D. ?#2 vitamin D-recommend 800 international units daily. ?#3 exercise-recommend 30 minutes of weightbearing exercise 3 days a week.  Resistance training ,such as doing bands and light weights, can be particularly helpful. ? ?

## 2021-10-21 ENCOUNTER — Encounter: Payer: Self-pay | Admitting: Cardiovascular Disease

## 2021-10-21 ENCOUNTER — Ambulatory Visit (INDEPENDENT_AMBULATORY_CARE_PROVIDER_SITE_OTHER): Payer: Medicare Other | Admitting: Cardiovascular Disease

## 2021-10-21 VITALS — BP 122/66 | HR 72 | Ht 64.0 in | Wt 185.2 lb

## 2021-10-21 DIAGNOSIS — M79604 Pain in right leg: Secondary | ICD-10-CM

## 2021-10-21 DIAGNOSIS — M79605 Pain in left leg: Secondary | ICD-10-CM

## 2021-10-21 NOTE — Progress Notes (Signed)
?Cardiology Office Note:   ? ?Date:  10/21/2021  ? ?ID:  Anne [name], DOB December 25, 1950, MRN [id number] ? ?PCP:  [name], MD  ?Cardiologist:  Mertie Moores, MD  ?Electrophysiologist:  None  ? ?Referring MD: [name], *  ? ?Chief Complaint  ?Patient presents with  ? Atrial Fibrillation  ? Congestive Heart Failure  ?  ? ?Previous notes   ? ?Anne [name] is a 71 y.o. female with a hx of paroxysmal atrial fibrillation, hypertension, hyperlipidemia, hypothyroidism with prior thyroid thyroidectomy.    I met her in March , 2019 in the hospital when she was admitted with rapid AFib. ?The Afib Spontaneously converted.  She had a mild troponin bump.     Her TSH was very low and it was thought that she may have been over replaced with her Synthroid. ?Sleep study in June, 2019 was negative for obstructive sleep apnea. ? ?He was recently seen by Richardson Dopp, PA in July for further evaluation of rapid atrial fibrillation. ? ? ?CHADS2-VASc= 4 (age x1, female, HTN, aortic atherosclerosis).   ? ?Dec. 20, 2019 ?Anne [name] is seen with husband, Clair Gulling  ? ?No further episodes of Afib ?Uses a Kardia monitor.   Husband also has Afib  ?Has a hissing sound in her right ear ,  Has seen ENT.  ? ?June 03, 2019:  ? ?Anne [name] is seen today for follow-up of her paroxysmal atrial fibrillation and chronic diastolic congestive heart failure. ?No CP or dyspnea ?No significant palpitations ?Has remained in NSR - has PAF,  Remains on Eliquis  ? ?Feb. 1, 2022 ?Anne [name] is seen today for follow up of her PAF and chronic diastolic CHF ?Is very active.  Works in the yard.   No CP , no dyspnea. ?[name] is wondering if she needs to continue taking the Eliquis - she  Has not had an episode of Afib since she had the brief episode while in the hospital with hyperthypoidism.  ?Her PAF was a single episode of  ? ?October 21, 2021:  ?Anne [name] seen today for follow-up of her paroxysmal atrial fibrillation and chronic diastolic  congestive heart failure ?Walks regularly  ?Does yard work regularly  ?Goes back to the [employer] .  ? ?Past Medical History:  ?Diagnosis Date  ? Acid reflux   ? Aortic atherosclerosis ([place]) 04/28/2013  ? CT 08/2017  ? Arthritis   ? "joints, hands" (08/30/2017)  ? Chronic lower back pain   ? Disc herniation   ? neck and low back  ? Family history of adverse reaction to anesthesia   ? "daughter w/PONV"  ? Follicular cystitis   ? Hiatal hernia   ? High cholesterol   ? Hyperlipidemia   ? ELEV CHOLESTEROL  ? Hypothyroidism   ? Osteopenia 2014  ? T score -1.6 FRAX 13%/1%  ? PAF (paroxysmal atrial fibrillation) ([place]) 08/30/2017  ? Echo 2/19: EF 55-60, normal wall motion, grade 1 diastolic dysfunction, mild PI, trivial TR //  Nuc 3/19: no ischemia or scar, EF 80 // CHADS2-VASc: 3 // Eliquis 5 bid   ? Schatzki's ring   ? ? ?Past Surgical History:  ?Procedure Laterality Date  ? BREAST BIOPSY Bilateral 1997  ? "excisional bx both benign"  ? BREAST CYST ASPIRATION Right ~ 1993  ? CATARACT EXTRACTION W/ INTRAOCULAR LENS IMPLANT Left 2011  ? CATARACT EXTRACTION W/ INTRAOCULAR LENS IMPLANT Right 12/20/2012  ? Dr. Haynes Bast, OD  ? COLONOSCOPY WITH ESOPHAGOGASTRODUODENOSCOPY (EGD)  "several"  ? "?  dilated esophagus" (08/30/2017)  ? [place] OF UTERUS  1996  ? EYE MUSCLE SURGERY Bilateral 1992  ? HERNIA REPAIR    ? HYSTEROSCOPY    ? LAPAROSCOPIC CHOLECYSTECTOMY  09/28/2011  ? OOPHORECTOMY Right 04/2000  ? LAP RSO/LYSIS OF ADHESIONS  ? THYROIDECTOMY  1976  ? TUBAL LIGATION  1976  ? VARICOSE VEIN SURGERY Bilateral 1972  ? VENTRAL HERNIA REPAIR  06/23/13  ? Dr. Franz Dell  ? ? ?Current Medications: ?Current Meds  ?Medication Sig  ? CALCIUM PO Take 1 tablet by mouth daily.   ? Cholecalciferol (VITAMIN D) 2000 UNITS tablet Take 2,000 Units by mouth daily.  ? clobetasol (TEMOVATE) 0.05 % external solution Apply 1 application topically 2 (two) times daily.  ? estradiol (ESTRACE) 0.1 MG/GM vaginal cream Place 4.19  Applicatorfuls vaginally as needed. For vaginal irritation  ? meloxicam (MOBIC) 15 MG tablet TAKE 1 TABLET (15 MG TOTAL) BY MOUTH DAILY.  ? metoprolol succinate (TOPROL-XL) 50 MG 24 hr tablet TAKE 1 AND 1/2 TABS DAILY TO = 75 MG DAILY  ? Multiple Vitamin (MULTIVITAMIN) tablet Take 1 tablet by mouth daily.  ? omeprazole (PRILOSEC) 40 MG capsule TAKE 1 CAPSULE (40 MG TOTAL) BY MOUTH DAILY.  ? simvastatin (ZOCOR) 40 MG tablet TAKE 1 TABLET BY MOUTH EVERY DAY  ? SYNTHROID 75 MCG tablet TAKE 1 TABLET BY MOUTH DAILY BEFORE BREAKFAST.  ?  ? ?Allergies:   Amoxicillin-pot clavulanate, Ceftriaxone sodium, Cephalexin, Codeine, Molds & smuts, and Zofran [ondansetron hcl]  ? ?Social History  ? ?Socioeconomic History  ? Marital status: Married  ?  Spouse name: [name]  ? Number of children: 1  ? Years of education: 10  ? Highest education level: GED or equivalent  ?Occupational History  ? Occupation: Teaching laboratory technician  ?  Comment: retired  ?Tobacco Use  ? Smoking status: Former  ?  Packs/day: 0.10  ?  Years: 18.00  ?  Pack years: 1.80  ?  Types: Cigarettes  ?  Quit date: 01/27/1979  ?  Years since quitting: 42.7  ?  Passive exposure: Past  ? Smokeless tobacco: Never  ?Vaping Use  ? Vaping Use: Never used  ?Substance and Sexual Activity  ? Alcohol use: Yes  ?  Alcohol/week: 1.0 standard drink  ?  Types: 1 Glasses of wine per week  ?  Comment: 2 glasses of wine a month  ? Drug use: No  ? Sexual activity: Not Currently  ?  Partners: Male  ?  Birth control/protection: Post-menopausal  ?Other Topics Concern  ? Not on file  ?Social History Narrative  ? 4 brothers and 6 sisters. All 4 brother deceased.  2 sisters deceased.  Tries to go the park to walk with her husband. Likes to [employer] and do crossword puzzles.  ? ?Social Determinants of Health  ? ?Financial Resource Strain: Not on file  ?Food Insecurity: Not on file  ?Transportation Needs: Not on file  ?Physical Activity: Not on file  ?Stress: Not on file  ?Social Connections: Not on file  ?   ? ?Family History: ?The patient's family history includes Anuerysm in her father; Arthritis in her daughter and sister; Breast cancer in her sister; Cancer in her sister; Cancer (age of onset: 46) in her brother; Heart attack (age of onset: 81) in her father; Heart disease in her father; Hyperparathyroidism in her daughter; Hypertension in her mother and sister; Leukemia in her brother; Lupus in her sister; Melanoma in her brother; Prostate cancer in her  brother; Pulmonary fibrosis in her brother; Rheum arthritis in her sister; Stroke in her sister and sister. ? ?ROS:   ?Please see the history of present illness.    ? All other systems reviewed and are negative. ? ?EKGs/Labs/Other Studies Reviewed:   ? ?The following studies were reviewed today: ? ? ?EKG:   October 21, 2021: Normal sinus rhythm at 72.  Left anterior fascicular block.  Moderate voltage criteria for left ventricular hypertrophy. ? ?Recent Labs: ?10/04/2021: ALT 13; BUN 19; Creat 0.88; Potassium 4.2; Sodium 140; TSH 0.92  ?Recent Lipid Panel ?   ?Component Value Date/Time  ? CHOL 167 09/28/2020 0000  ? TRIG 133 09/28/2020 0000  ? HDL 48 (L) 09/28/2020 0000  ? CHOLHDL 3.5 09/28/2020 0000  ? VLDL 20 08/31/2017 0233  ? [place] 96 09/28/2020 0000  ? ? ?Physical Exam:   ? ?Physical Exam: ?Blood pressure 122/66, pulse 72, height 5' 4"  (1.626 m), weight 185 lb 3.2 oz (84 kg), last menstrual period 09/08/2000, SpO2 99 %. ? ?GEN:  Well nourished, well developed in no acute distress ?HEENT: Normal ?NECK: No JVD; No carotid bruits ?LYMPHATICS: No lymphadenopathy ?CARDIAC: RRR , no murmurs, rubs, gallops ?RESPIRATORY:  Clear to auscultation without rales, wheezing or rhonchi  ?ABDOMEN: Soft, non-tender, non-distended ?MUSCULOSKELETAL:  No edema; No deformity  ?SKIN: Warm and dry ?NEUROLOGIC:  Alert and oriented x 3 ? ? ? ?EKG: October 21, 2021: Normal sinus rhythm at 72.  Left anterior fascicular block.  Moderate voltage criteria for left ventricular  perjury. ? ?ASSESSMENT:   ? ?1. Leg pain, bilateral   ? ? ?PLAN:   ? ?: ? ? Paroxysmal Atrial fib:   She had an episode of atrial fibrillation in the setting of hyperthyroidism.  This occurred several years ago in the hospital

## 2021-10-21 NOTE — Patient Instructions (Signed)
Medication Instructions:  ? ?Your physician recommends that you continue on your current medications as directed. Please refer to the Current Medication list given to you today. ? ?*If you need a refill on your cardiac medications before your next appointment, please call your pharmacy* ? ? ?Testing/Procedures: ? ?Your physician has requested that you have a lower extremity arterial duplex. This test is an ultrasound of the arteries in the legs or arms. It looks at arterial blood flow in the legs and arms. Allow one hour for Lower and Upper Arterial scans. There are no restrictions or special instructions ? ?Your physician has requested that you have an ankle brachial index (ABI). During this test an ultrasound and blood pressure cuff are used to evaluate the arteries that supply the arms and legs with blood. Allow thirty minutes for this exam. There are no restrictions or special instructions. ? ? ?Follow-Up: ?At [facility], you and your health needs are our priority.  As part of our continuing mission to provide you with exceptional heart care, we have created designated Provider Care Teams.  These Care Teams include your primary Cardiologist (physician) and Advanced Practice Providers (APPs -  Physician Assistants and Nurse Practitioners) who all work together to provide you with the care you need, when you need it. ? ?We recommend signing up for the patient portal called "MyChart".  Sign up information is provided on this After Visit Summary.  MyChart is used to connect with patients for Virtual Visits (Telemedicine).  Patients are able to view lab/test results, encounter notes, upcoming appointments, etc.  Non-urgent messages can be sent to your provider as well.   ?To learn more about what you can do with MyChart, go to [email or web address].   ? ?Your next appointment:   ?1 year(s) ? ?The format for your next appointment:   ?In Person ? ?Provider:   ?Mertie Moores, MD { ? ? ?Important Information About  [place] ? ? ? ? ? ? ?

## 2021-10-30 ENCOUNTER — Other Ambulatory Visit: Payer: Self-pay | Admitting: Family Medicine

## 2021-10-30 DIAGNOSIS — E039 Hypothyroidism, unspecified: Secondary | ICD-10-CM

## 2021-10-31 ENCOUNTER — Ambulatory Visit (HOSPITAL_COMMUNITY)
Admission: RE | Admit: 2021-10-31 | Discharge: 2021-10-31 | Disposition: A | Discharge status: Home / Self Care | Payer: Medicare Other | Source: Ambulatory Visit | Attending: Internal Medicine | Admitting: Internal Medicine

## 2021-10-31 ENCOUNTER — Other Ambulatory Visit: Payer: Self-pay | Admitting: Cardiovascular Disease

## 2021-10-31 DIAGNOSIS — M79604 Pain in right leg: Secondary | ICD-10-CM | POA: Diagnosis not present

## 2021-10-31 DIAGNOSIS — M79605 Pain in left leg: Secondary | ICD-10-CM | POA: Diagnosis not present

## 2022-01-22 ENCOUNTER — Other Ambulatory Visit: Payer: Self-pay | Admitting: Family Medicine

## 2022-01-26 ENCOUNTER — Other Ambulatory Visit: Payer: Self-pay | Admitting: Family Medicine

## 2022-02-15 ENCOUNTER — Encounter: Payer: Self-pay | Admitting: General Practice

## 2022-02-28 NOTE — Progress Notes (Signed)
Office Visit Note  Patient: Anne [name]             Date of Birth: 1950/10/05           MRN: [id number]             PCP: [name], MD Referring: [name], * Visit Date: 03/14/2022 Occupation: '@GUAROCC'$ @  Subjective:  Follow-up (Left 4th digit, right middle finger locking)   History of Present Illness: Anne [name] is a 71 y.o. female with history of positive anti-CCP antibody, osteoarthritis and degenerative disc disease.  She continues to have some pain and stiffness in her hands.  She also has intermittent locking of the left middle and fourth digits.  She complains of discomfort in her right trochanteric area.  Discomfort in the cervical and lumbar spine currently.  Taking calcium and vitamin D on a regular basis.  Activities of Daily Living:  Patient reports morning stiffness for 5 minutes.   Patient Denies nocturnal pain.  Difficulty dressing/grooming: Denies Difficulty climbing stairs: Reports Difficulty getting out of chair: Denies Difficulty using hands for taps, buttons, cutlery, and/or writing: Denies  Review of Systems  Constitutional:  Positive for fatigue.  HENT:  Negative for mouth sores and mouth dryness.   Eyes:  Positive for dryness.  Respiratory:  Negative for shortness of breath.   Cardiovascular:  Negative for chest pain and palpitations.  Gastrointestinal:  Negative for blood in stool, constipation and diarrhea.  Endocrine: Negative for increased urination.  Genitourinary:  Negative for involuntary urination.  Musculoskeletal:  Positive for joint pain, joint pain, joint swelling, myalgias, morning stiffness, muscle tenderness and myalgias. Negative for gait problem and muscle weakness.  Skin:  Negative for color change, rash, hair loss and sensitivity to sunlight.  Allergic/Immunologic: Negative for susceptible to infections.  Neurological:  Negative for dizziness and headaches.  Hematological:  Negative for  swollen glands.  Psychiatric/Behavioral:  Negative for depressed mood and sleep disturbance. The patient is not nervous/anxious.     PMFS History:  Patient Active Problem List   Diagnosis Date Noted   Globus sensation 03/29/2021   Dry mouth 03/29/2020   Seborrheic dermatitis of scalp 01/22/2020   Cervical radiculopathy 08/25/2019   History of esophagogastroduodenoscopy (EGD) 02/08/2018   Colon cancer screening 02/08/2018   Essential hypertension 12/25/2017   GERD (gastroesophageal reflux disease) 08/30/2017   Chronic diastolic (congestive) heart failure ([place]) 08/30/2017   History of atrial fibrillation 08/30/2017   Flushing 07/30/2017   Paresthesia of both hands 07/30/2017   Postsurgical hypothyroidism 07/30/2017   Aortic atherosclerosis ([place]) 04/28/2013   S/P cataract extraction 01/16/2013   Pseudophakia of both eyes 12/20/2012   Schatzki's ring 10/16/2012   Spinal stenosis of lumbar region 08/16/2012   Vaginal atrophy 11/14/2011   Graves' disease 05/05/2011   Disc herniation    Osteopenia    PERSONAL HISTORY OF ALLERGY TO LATEX 12/23/2009   UNSPECIFIED DISEASE OF PHARYNX 12/16/2008   Intermittent palpitations 11/10/2008   Hypothyroidism 09/22/2008   Hyperlipidemia 09/22/2008   ALLERGIC RHINITIS 09/22/2008    Past Medical History:  Diagnosis Date   Acid reflux    Aortic atherosclerosis ([place]) 04/28/2013   CT 08/2017   Arthritis    "joints, hands" (08/30/2017)   Chronic lower back pain    Disc herniation    neck and low back   Family history of adverse reaction to anesthesia    "daughter w/PONV"   Follicular cystitis    Hiatal hernia  High cholesterol    Hyperlipidemia    ELEV CHOLESTEROL   Hypothyroidism    Osteopenia 2014   T score -1.6 FRAX 13%/1%   PAF (paroxysmal atrial fibrillation) ([place]) 08/30/2017   Echo 2/19: EF 55-60, normal wall motion, grade 1 diastolic dysfunction, mild PI, trivial TR //  Nuc 3/19: no ischemia or scar, EF 33 // CHADS2-VASc: 3 //  Eliquis 5 bid    Schatzki's ring     Family History  Problem Relation Age of Onset   Hypertension Mother    Heart attack Father 72   Heart disease Father    Anuerysm Father    Stroke Sister    Hypertension Sister    Arthritis Sister    Lupus Sister    Cancer Sister    Breast cancer Sister        Age 44   Rheum arthritis Sister    Stroke Sister    Cancer Brother 35       melanoma   Melanoma Brother    Prostate cancer Brother    Leukemia Brother    Pulmonary fibrosis Brother    Arthritis Daughter        Rheumatoid   Hyperparathyroidism Daughter    Past Surgical History:  Procedure Laterality Date   BREAST BIOPSY Bilateral 1997   "excisional bx both benign"   BREAST CYST ASPIRATION Right ~ [place] IMPLANT Left 2011   CATARACT EXTRACTION W/ INTRAOCULAR LENS IMPLANT Right 12/20/2012   Dr. Haynes Bast, OD   COLONOSCOPY WITH ESOPHAGOGASTRODUODENOSCOPY (EGD)  "several"   "? dilated esophagus" (08/30/2017)   DILATION AND CURETTAGE OF UTERUS  1996   EYE MUSCLE SURGERY Bilateral 1992   HERNIA REPAIR     HYSTEROSCOPY     LAPAROSCOPIC CHOLECYSTECTOMY  09/28/2011   OOPHORECTOMY Right 04/2000   LAP RSO/LYSIS OF ADHESIONS   THYROIDECTOMY  1976   TUBAL LIGATION  1976   VARICOSE VEIN SURGERY Bilateral 1972   VENTRAL HERNIA REPAIR  06/23/13   Dr. Franz Dell   Social History   Social History Narrative   4 brothers and 6 sisters. All 4 brother deceased.  2 sisters deceased.  Tries to go the park to walk with her husband. Likes to [employer] and do crossword puzzles.   Immunization History  Administered Date(s) Administered   Influenza Split 06/02/2011, 05/03/2012   Influenza Whole 04/16/2009   Influenza,inj,Quad PF,6+ Mos 05/01/2013, 04/17/2014   Pneumococcal Conjugate-13 10/17/2013   Pneumococcal Polysaccharide-23 02/04/2016   Td 01/21/2010   Zoster, Live 08/18/2011     Objective: Vital Signs: BP 128/77 (BP Location: Left Arm, Patient  Position: Sitting, Cuff Size: Normal)   Pulse 80   Resp 16   Ht '5\' 5"'$  (1.651 m)   Wt 187 lb (84.8 kg)   LMP 09/08/2000   BMI 31.12 kg/m    Physical Exam Vitals and nursing note reviewed.  Constitutional:      Appearance: She is well-developed.  HENT:     Head: Normocephalic and atraumatic.  Eyes:     Conjunctiva/sclera: Conjunctivae normal.  Cardiovascular:     Rate and Rhythm: Normal rate and regular rhythm.     Heart sounds: Normal heart sounds.  Pulmonary:     Effort: Pulmonary effort is normal.     Breath sounds: Normal breath sounds.  Abdominal:     General: Bowel sounds are normal.     Palpations: Abdomen is soft.  Musculoskeletal:     Cervical back:  Normal range of motion.  Lymphadenopathy:     Cervical: No cervical adenopathy.  Skin:    General: Skin is warm and dry.     Capillary Refill: Capillary refill takes less than 2 seconds.  Neurological:     Mental Status: She is alert and oriented to person, place, and time.  Psychiatric:        Behavior: Behavior normal.      Musculoskeletal Exam: C-spine was in good range of motion.  Shoulder joints, elbow joints, wrist joints, MCPs PIPs and DIPs with good range of motion.  She had bilateral PIP and DIP thickening.  She has subluxation of the bilateral first DIP joints.  Hip joints were in good range of motion.  She had discomfort range of motion of her right hip and also tenderness over right trochanteric bursa.  Knee joints were in good range of motion without any warmth swelling or effusion.  There was no tenderness over ankles or MTPs.  CDAI Exam: CDAI Score: -- Patient Global: --; Provider Global: -- Swollen: --; Tender: -- Joint Exam 03/14/2022   No joint exam has been documented for this visit   There is currently no information documented on the homunculus. Go to the Rheumatology activity and complete the homunculus joint exam.  Investigation: No additional findings.  Imaging: No results  found.  Recent Labs: Lab Results  Component Value Date   WBC 5.3 09/28/2020   HGB 14.0 09/28/2020   PLT 182 09/28/2020   NA 140 10/04/2021   K 4.2 10/04/2021   CL 103 10/04/2021   CO2 27 10/04/2021   GLUCOSE 98 10/04/2021   BUN 19 10/04/2021   CREATININE 0.88 10/04/2021   BILITOT 0.8 10/04/2021   ALKPHOS 81 08/30/2017   AST 16 10/04/2021   ALT 13 10/04/2021   PROT 7.3 10/04/2021   ALBUMIN 4.2 08/30/2017   CALCIUM 9.6 10/04/2021   GFRAA 70 09/28/2020    Speciality Comments: No specialty comments available.  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  I advised her to contact me if she develops any swelling.  Primary osteoarthritis of both hands -she has osteoarthritis involving bilateral hands with DIP and PIP thickening.  Subluxation of bilateral first DIP joints was noted.  Joint protection muscle strengthening was discussed.  A handout on hand exercises was given.  Primary osteoarthritis of both knees -she has intermittent discomfort in her knee joints.  No warmth swelling or effusion was noted.  Previous x-rays showed bilateral mild osteoarthritis and moderate chondromalacia patella:   Trigger finger, left ring finger -she gives history of intermittent and mild triggering at times.  I advised her to contact me if she has worsening of symptoms.  Trigger finger, right middle finger-history of intermittent triggering.  DDD (degenerative disc disease), cervical- she had good range of motion without discomfort.  Chronic right-sided low back pain with right-sided sciatica -denies any discomfort today.  X-ray showed severe levoscoliosis, multilevel spondylosis L4-L5  listhesis and facet joint arthropathy.    Pain in right hip -she is to have some discomfort in her right hip joint.  She had had some discomfort with range of motion.  X-rays of the hip joint obtained in the past were unremarkable.  A  handout on hip exercises was given.  Osteopenia of multiple sites - DEXA 10/12/2021 : The BMD measured at Forearm Radius 33% is 0.682 g/cm2 with a T-score of -2.2.  DEXA findings were reviewed.  Calcium rich diet, vitamin D and resistive exercises were discussed.  Other medical problems are listed as follows:  Chronic diastolic (congestive) heart failure (HCC)  Paroxysmal atrial fibrillation (HCC)  History of hyperlipidemia  Aortic atherosclerosis (HCC)  Essential hypertension  History of gastroesophageal reflux (GERD)  Postsurgical hypothyroidism  Family history of rheumatoid arthritis  Former smoker  Orders: No orders of the defined types were placed in this encounter.  No orders of the defined types were placed in this encounter.    Follow-Up Instructions: Return in about 1 year (around 03/15/2023) for Osteoarthritis.   Bo Merino, MD  Note - This record has been created using Editor, commissioning.  Chart creation errors have been sought, but may not always  have been located. Such creation errors do not reflect on  the standard of medical care.

## 2022-03-14 ENCOUNTER — Ambulatory Visit: Payer: Medicare Other | Attending: Rheumatology | Admitting: Rheumatology

## 2022-03-14 ENCOUNTER — Encounter: Payer: Self-pay | Admitting: Rheumatology

## 2022-03-14 VITALS — BP 128/77 | HR 80 | Resp 16 | Ht 65.0 in | Wt 187.0 lb

## 2022-03-14 DIAGNOSIS — I480 Paroxysmal atrial fibrillation: Secondary | ICD-10-CM

## 2022-03-14 DIAGNOSIS — I10 Essential (primary) hypertension: Secondary | ICD-10-CM

## 2022-03-14 DIAGNOSIS — M170 Bilateral primary osteoarthritis of knee: Secondary | ICD-10-CM

## 2022-03-14 DIAGNOSIS — I700 Atherosclerosis of aorta: Secondary | ICD-10-CM | POA: Diagnosis not present

## 2022-03-14 DIAGNOSIS — M19042 Primary osteoarthritis, left hand: Secondary | ICD-10-CM

## 2022-03-14 DIAGNOSIS — M65331 Trigger finger, right middle finger: Secondary | ICD-10-CM

## 2022-03-14 DIAGNOSIS — Z8261 Family history of arthritis: Secondary | ICD-10-CM

## 2022-03-14 DIAGNOSIS — M25551 Pain in right hip: Secondary | ICD-10-CM

## 2022-03-14 DIAGNOSIS — M5441 Lumbago with sciatica, right side: Secondary | ICD-10-CM

## 2022-03-14 DIAGNOSIS — G8929 Other chronic pain: Secondary | ICD-10-CM

## 2022-03-14 DIAGNOSIS — M8589 Other specified disorders of bone density and structure, multiple sites: Secondary | ICD-10-CM

## 2022-03-14 DIAGNOSIS — M65342 Trigger finger, left ring finger: Secondary | ICD-10-CM

## 2022-03-14 DIAGNOSIS — M19041 Primary osteoarthritis, right hand: Secondary | ICD-10-CM

## 2022-03-14 DIAGNOSIS — Z87891 Personal history of nicotine dependence: Secondary | ICD-10-CM

## 2022-03-14 DIAGNOSIS — M5030 Other cervical disc degeneration, unspecified cervical region: Secondary | ICD-10-CM | POA: Diagnosis not present

## 2022-03-14 DIAGNOSIS — Z8719 Personal history of other diseases of the digestive system: Secondary | ICD-10-CM

## 2022-03-14 DIAGNOSIS — E890 Postprocedural hypothyroidism: Secondary | ICD-10-CM

## 2022-03-14 DIAGNOSIS — I5032 Chronic diastolic (congestive) heart failure: Secondary | ICD-10-CM

## 2022-03-14 DIAGNOSIS — Z8639 Personal history of other endocrine, nutritional and metabolic disease: Secondary | ICD-10-CM

## 2022-03-14 DIAGNOSIS — R768 Other specified abnormal immunological findings in serum: Secondary | ICD-10-CM

## 2022-03-14 DIAGNOSIS — R7681 Abnormal rheumatoid factor and anti-citrullinated protein antibody without rheumatoid arthritis: Secondary | ICD-10-CM

## 2022-03-14 NOTE — Patient Instructions (Signed)
Hand Exercises Hand exercises can be helpful for almost anyone. These exercises can strengthen the hands, improve flexibility and movement, and increase blood flow to the hands. These results can make work and daily tasks easier. Hand exercises can be especially helpful for people who have joint pain from arthritis or have nerve damage from overuse (carpal tunnel syndrome). These exercises can also help people who have injured a hand. Exercises Most of these hand exercises are gentle stretching and motion exercises. It is usually safe to do them often throughout the day. Warming up your hands before exercise may help to reduce stiffness. You can do this with gentle massage or by placing your hands in warm water for 10-15 minutes. It is normal to feel some stretching, pulling, tightness, or mild discomfort as you begin new exercises. This will gradually improve. Stop an exercise right away if you feel sudden, severe pain or your pain gets worse. Ask your health care provider which exercises are best for you. Knuckle bend or "claw" fist  Stand or sit with your arm, hand, and all five fingers pointed straight up. Make sure to keep your wrist straight during the exercise. Gently bend your fingers down toward your palm until the tips of your fingers are touching the top of your palm. Keep your big knuckle straight and just bend the small knuckles in your fingers. Hold this position for __________ seconds. Straighten (extend) your fingers back to the starting position. Repeat this exercise 5-10 times with each hand. Full finger fist  Stand or sit with your arm, hand, and all five fingers pointed straight up. Make sure to keep your wrist straight during the exercise. Gently bend your fingers into your palm until the tips of your fingers are touching the middle of your palm. Hold this position for __________ seconds. Extend your fingers back to the starting position, stretching every joint fully. Repeat  this exercise 5-10 times with each hand. Straight fist Stand or sit with your arm, hand, and all five fingers pointed straight up. Make sure to keep your wrist straight during the exercise. Gently bend your fingers at the big knuckle, where your fingers meet your hand, and the middle knuckle. Keep the knuckle at the tips of your fingers straight and try to touch the bottom of your palm. Hold this position for __________ seconds. Extend your fingers back to the starting position, stretching every joint fully. Repeat this exercise 5-10 times with each hand. Tabletop  Stand or sit with your arm, hand, and all five fingers pointed straight up. Make sure to keep your wrist straight during the exercise. Gently bend your fingers at the big knuckle, where your fingers meet your hand, as far down as you can while keeping the small knuckles in your fingers straight. Think of forming a tabletop with your fingers. Hold this position for __________ seconds. Extend your fingers back to the starting position, stretching every joint fully. Repeat this exercise 5-10 times with each hand. Finger spread  Place your hand flat on a table with your palm facing down. Make sure your wrist stays straight as you do this exercise. Spread your fingers and thumb apart from each other as far as you can until you feel a gentle stretch. Hold this position for __________ seconds. Bring your fingers and thumb tight together again. Hold this position for __________ seconds. Repeat this exercise 5-10 times with each hand. Making circles  Stand or sit with your arm, hand, and all five fingers pointed   straight up. Make sure to keep your wrist straight during the exercise. Make a circle by touching the tip of your thumb to the tip of your index finger. Hold for __________ seconds. Then open your hand wide. Repeat this motion with your thumb and each finger on your hand. Repeat this exercise 5-10 times with each hand. Thumb  motion  Sit with your forearm resting on a table and your wrist straight. Your thumb should be facing up toward the ceiling. Keep your fingers relaxed as you move your thumb. Lift your thumb up as high as you can toward the ceiling. Hold for __________ seconds. Bend your thumb across your palm as far as you can, reaching the tip of your thumb for the small finger (pinkie) side of your palm. Hold for __________ seconds. Repeat this exercise 5-10 times with each hand. Grip strengthening  Hold a stress ball or other soft ball in the middle of your hand. Slowly increase the pressure, squeezing the ball as much as you can without causing pain. Think of bringing the tips of your fingers into the middle of your palm. All of your finger joints should bend when doing this exercise. Hold your squeeze for __________ seconds, then relax. Repeat this exercise 5-10 times with each hand. Contact a health care provider if: Your hand pain or discomfort gets much worse when you do an exercise. Your hand pain or discomfort does not improve within 2 hours after you exercise. If you have any of these problems, stop doing these exercises right away. Do not do them again unless your health care provider says that you can. Get help right away if: You develop sudden, severe hand pain or swelling. If this happens, stop doing these exercises right away. Do not do them again unless your health care provider says that you can. This information is not intended to replace advice given to you by your health care provider. Make sure you discuss any questions you have with your health care provider. Document Revised: 09/30/2020 Document Reviewed: 09/30/2020 [employer] Patient Education  2023 [employer] Hip Exercises Ask your health care provider which exercises are safe for you. Do exercises exactly as told by your health care provider and adjust them as directed. It is normal to feel mild stretching, pulling, tightness, or  discomfort as you do these exercises. Stop right away if you feel sudden pain or your pain gets worse. Do not begin these exercises until told by your health care provider. Stretching and range-of-motion exercises These exercises warm up your muscles and joints and improve the movement and flexibility of your hip. These exercises also help to relieve pain, numbness, and tingling. You may be asked to limit your range of motion if you had a hip replacement. Talk to your health care provider about these restrictions. Hamstrings, supine  Lie on your back (supine position). Loop a belt or towel over the ball of your left / right foot. The ball of your foot is on the walking surface, right under your toes. Straighten your left / right knee and slowly pull on the belt or towel to raise your leg until you feel a gentle stretch behind your knee (hamstring). Do not let your knee bend while you do this. Keep your other leg flat on the floor. Hold this position for __________ seconds. Slowly return your leg to the starting position. Repeat __________ times. Complete this exercise __________ times a day. Hip rotation  Lie on your back on a firm   surface. With your left / right hand, gently pull your left / right knee toward the shoulder that is on the same side of the body. Stop when your knee is pointing toward the ceiling. Hold your left / right ankle with your other hand. Keeping your knee steady, gently pull your left / right ankle toward your other shoulder until you feel a stretch in your buttocks. Keep your hips and shoulders firmly planted while you do this stretch. Hold this position for __________ seconds. Repeat __________ times. Complete this exercise __________ times a day. Seated stretch This exercise is sometimes called hamstrings and adductors stretch. Sit on the floor with your legs stretched wide. Keep your knees straight during this exercise. Keeping your head and back in a straight  line, bend at your waist to reach for your left foot (position A). You should feel a stretch in your right inner thigh (adductors). Hold this position for __________ seconds. Then slowly return to the upright position. Keeping your head and back in a straight line, bend at your waist to reach forward (position B). You should feel a stretch behind both of your thighs and knees (hamstrings). Hold this position for __________ seconds. Then slowly return to the upright position. Keeping your head and back in a straight line, bend at your waist to reach for your right foot (position C). You should feel a stretch in your left inner thigh (adductors). Hold this position for __________ seconds. Then slowly return to the upright position. Repeat __________ times. Complete this exercise __________ times a day. Lunge This exercise stretches the muscles of the hip (hip flexors). Place your left / right knee on the floor and bend your other knee so that is directly over your ankle. You should be half-kneeling. Keep good posture with your head over your shoulders. Tighten your buttocks to point your tailbone downward. This will prevent your back from arching too much. You should feel a gentle stretch in the front of your left / right thigh and hip. If you do not feel a stretch, slide your other foot forward slightly and then slowly lunge forward with your chest up until your knee once again lines up over your ankle. Make sure your tailbone continues to point downward. Hold this position for __________ seconds. Slowly return to the starting position. Repeat __________ times. Complete this exercise __________ times a day. Strengthening exercises These exercises build strength and endurance in your hip. Endurance is the ability to use your muscles for a long time, even after they get tired. Bridge This exercise strengthens the muscles of your hip (hip extensors). Lie on your back on a firm surface with your  knees bent and your feet flat on the floor. Tighten your buttocks muscles and lift your bottom off the floor until the trunk of your body and your hips are level with your thighs. Do not arch your back. You should feel the muscles working in your buttocks and the back of your thighs. If you do not feel these muscles, slide your feet 1-2 inches (2.5-5 cm) farther away from your buttocks. Hold this position for __________ seconds. Slowly lower your hips to the starting position. Let your muscles relax completely between repetitions. Repeat __________ times. Complete this exercise __________ times a day. Straight leg raises, side-lying This exercise strengthens the muscles that move the hip joint away from the center of the body (hip abductors). Lie on your side with your left / right leg in the top position.   Lie so your head, shoulder, hip, and knee line up. You may bend your bottom knee slightly to help you balance. Roll your hips slightly forward, so your hips are stacked directly over each other and your left / right knee is facing forward. Leading with your heel, lift your top leg 4-6 inches (10-15 cm). You should feel the muscles in your top hip lifting. Do not let your foot drift forward. Do not let your knee roll toward the ceiling. Hold this position for __________ seconds. Slowly return to the starting position. Let your muscles relax completely between repetitions. Repeat __________ times. Complete this exercise __________ times a day. Straight leg raises, side-lying This exercise strengthens the muscles that move the hip joint toward the center of the body (hip adductors). Lie on your side with your left / right leg in the bottom position. Lie so your head, shoulder, hip, and knee line up. You may place your upper foot in front to help you balance. Roll your hips slightly forward, so your hips are stacked directly over each other and your left / right knee is facing forward. Tense the  muscles in your inner thigh and lift your bottom leg 4-6 inches (10-15 cm). Hold this position for __________ seconds. Slowly return to the starting position. Let your muscles relax completely between repetitions. Repeat __________ times. Complete this exercise __________ times a day. Straight leg raises, supine This exercise strengthens the muscles in the front of your thigh (quadriceps). Lie on your back (supine position) with your left / right leg extended and your other knee bent. Tense the muscles in the front of your left / right thigh. You should see your kneecap slide up or see increased dimpling just above your knee. Keep these muscles tight as you raise your leg 4-6 inches (10-15 cm) off the floor. Do not let your knee bend. Hold this position for __________ seconds. Keep these muscles tense as you lower your leg. Relax the muscles slowly and completely between repetitions. Repeat __________ times. Complete this exercise __________ times a day. Hip abductors, standing This exercise strengthens the muscles that move the leg and hip joint away from the center of the body (hip abductors). Tie one end of a rubber exercise band or tubing to a secure surface, such as a chair, table, or pole. Loop the other end of the band or tubing around your left / right ankle. Keeping your ankle with the band or tubing directly opposite the secured end, step away until there is tension in the tubing or band. Hold on to a chair, table, or pole as needed for balance. Lift your left / right leg out to your side. While you do this: Keep your back upright. Keep your shoulders over your hips. Keep your toes pointing forward. Make sure to use your hip muscles to slowly lift your leg. Do not tip your body or forcefully lift your leg. Hold this position for __________ seconds. Slowly return to the starting position. Repeat __________ times. Complete this exercise __________ times a day. Squats This exercise  strengthens the muscles in the front of your thigh (quadriceps). Stand in a door frame so your feet and knees are in line with the frame. You may place your hands on the frame for balance. Slowly bend your knees and lower your hips like you are going to sit in a chair. Keep your lower legs in a straight-up-and-down position. Do not let your hips go lower than your knees. Do not   bend your knees lower than told by your health care provider. If your hip pain increases, do not bend as low. Hold this position for ___________ seconds. Slowly push with your legs to return to standing. Do not use your hands to pull yourself to standing. Repeat __________ times. Complete this exercise __________ times a day. This information is not intended to replace advice given to you by your health care provider. Make sure you discuss any questions you have with your health care provider. Document Revised: 10/27/2020 Document Reviewed: 10/27/2020 Elsevier Patient Education  2023 [employer] Inc[employer]  

## 2022-03-16 ENCOUNTER — Ambulatory Visit: Payer: Medicare Other

## 2022-03-20 ENCOUNTER — Ambulatory Visit
Admission: EM | Admit: 2022-03-20 | Discharge: 2022-03-20 | Disposition: A | Discharge status: Home / Self Care | Payer: Medicare Other | Attending: Family Medicine | Admitting: Family Medicine

## 2022-03-20 ENCOUNTER — Encounter: Payer: Self-pay | Admitting: Emergency Medicine

## 2022-03-20 DIAGNOSIS — R300 Dysuria: Secondary | ICD-10-CM | POA: Insufficient documentation

## 2022-03-20 DIAGNOSIS — R350 Frequency of micturition: Secondary | ICD-10-CM | POA: Diagnosis not present

## 2022-03-20 LAB — POCT URINALYSIS DIP (MANUAL ENTRY)
Bilirubin, UA: NEGATIVE
Blood, UA: NEGATIVE
Glucose, UA: NEGATIVE mg/dL
Ketones, POC UA: NEGATIVE mg/dL
Leukocytes, UA: NEGATIVE
Nitrite, UA: NEGATIVE
Protein Ur, POC: NEGATIVE mg/dL
Spec Grav, UA: <=1.005 — AB (ref 1.010–1.025)
Urobilinogen, UA: 0.2 E.U./dL
pH, UA: 6 (ref 5.0–8.0)

## 2022-03-20 MED ORDER — CIPROFLOXACIN HCL 500 MG PO TABS: 500.0000 mg | ORAL_TABLET | Freq: Two times a day (BID) | ORAL | 0 refills | Status: DC

## 2022-03-20 NOTE — Discharge Instructions (Signed)
Drink lots of water Take antibiotic 2 times a day Take Tylenol or your Mobic as needed for pain Follow-up with your primary care doctor  Your urine culture report will be available in MyChart in 2 to 3 days

## 2022-03-20 NOTE — ED Provider Notes (Signed)
[facility]    CSN: [id number] Arrival date & time: 03/20/22  1351      History   Chief Complaint Chief Complaint  Patient presents with   Urinary Frequency    HPI Anne [name] is a 71 y.o. female.   HPI  Patient is here for urinary symptoms.  She has urinary frequency dysuria and some lower abdominal bladder pressure for the last couple of days.  She states it is getting worse over time.  She has been drinking lots of water.  She has not noticed any hematuria or cloudy urine.  No flank pain, nausea vomiting, or fever and chills.  She did have some low back aching at first but is now better.  No history of kidney stones or kidney problems  Past Medical History:  Diagnosis Date   Acid reflux    Aortic atherosclerosis ([place]) 04/28/2013   CT 08/2017   Arthritis    "joints, hands" (08/30/2017)   Chronic lower back pain    Disc herniation    neck and low back   Family history of adverse reaction to anesthesia    "daughter w/PONV"   Follicular cystitis    Hiatal hernia    High cholesterol    Hyperlipidemia    ELEV CHOLESTEROL   Hypothyroidism    Osteopenia 2014   T score -1.6 FRAX 13%/1%   PAF (paroxysmal atrial fibrillation) ([place]) 08/30/2017   Echo 2/19: EF 55-60, normal wall motion, grade 1 diastolic dysfunction, mild PI, trivial TR //  Nuc 3/19: no ischemia or scar, EF 80 // CHADS2-VASc: 3 // Eliquis 5 bid    Schatzki's ring     Patient Active Problem List   Diagnosis Date Noted   Globus sensation 03/29/2021   Dry mouth 03/29/2020   Seborrheic dermatitis of scalp 01/22/2020   Cervical radiculopathy 08/25/2019   History of esophagogastroduodenoscopy (EGD) 02/08/2018   Colon cancer screening 02/08/2018   Essential hypertension 12/25/2017   GERD (gastroesophageal reflux disease) 08/30/2017   Chronic diastolic (congestive) heart failure ([place]) 08/30/2017   History of atrial fibrillation 08/30/2017   Flushing 07/30/2017   Paresthesia of both hands  07/30/2017   Postsurgical hypothyroidism 07/30/2017   Aortic atherosclerosis ([place]) 04/28/2013   S/P cataract extraction 01/16/2013   Pseudophakia of both eyes 12/20/2012   Schatzki's ring 10/16/2012   Spinal stenosis of lumbar region 08/16/2012   Vaginal atrophy 11/14/2011   Graves' disease 05/05/2011   Disc herniation    Osteopenia    PERSONAL HISTORY OF ALLERGY TO LATEX 12/23/2009   UNSPECIFIED DISEASE OF PHARYNX 12/16/2008   Intermittent palpitations 11/10/2008   Hypothyroidism 09/22/2008   Hyperlipidemia 09/22/2008   ALLERGIC RHINITIS 09/22/2008    Past Surgical History:  Procedure Laterality Date   BREAST BIOPSY Bilateral 1997   "excisional bx both benign"   BREAST CYST ASPIRATION Right ~ [place] Left 2011   CATARACT EXTRACTION W/ INTRAOCULAR LENS IMPLANT Right 12/20/2012   Dr. Haynes Bast, OD   COLONOSCOPY WITH ESOPHAGOGASTRODUODENOSCOPY (EGD)  "several"   "? dilated esophagus" (08/30/2017)   DILATION AND CURETTAGE OF UTERUS  1996   EYE MUSCLE SURGERY Bilateral 1992   HERNIA REPAIR     HYSTEROSCOPY     LAPAROSCOPIC CHOLECYSTECTOMY  09/28/2011   OOPHORECTOMY Right 04/2000   LAP RSO/LYSIS OF ADHESIONS   THYROIDECTOMY  1976   TUBAL LIGATION  1976   VARICOSE VEIN SURGERY Bilateral 1972   VENTRAL HERNIA REPAIR  06/23/13  Dr. Franz Dell    OB History     Gravida  1   Para  1   Term  1   Preterm      AB      Living  1      SAB      IAB      Ectopic      Multiple      Live Births  1            Home Medications    Prior to Admission medications   Medication Sig Start Date End Date Taking? Authorizing Provider  CALCIUM PO Take 1 tablet by mouth daily.    Yes [provider]  Cholecalciferol (VITAMIN D) 2000 UNITS tablet Take 2,000 Units by mouth daily.   Yes [provider]  ciprofloxacin (CIPRO) 500 MG tablet Take 1 tablet (500 mg total) by mouth 2 (two) times daily.  03/20/22  Yes Raylene Everts, MD  clobetasol (TEMOVATE) 0.05 % external solution Apply 1 application topically 2 (two) times daily. 05/18/21  Yes [provider]  estradiol (ESTRACE) 0.1 MG/GM vaginal cream Place 8.11 Applicatorfuls vaginally as needed. For vaginal irritation 06/05/14  Yes Fontaine, Belinda Block, MD  meloxicam (MOBIC) 15 MG tablet TAKE 1 TABLET (15 MG TOTAL) BY MOUTH DAILY. 01/27/22  Yes [name], MD  metoprolol succinate (TOPROL-XL) 50 MG 24 hr tablet TAKE 1 AND 1/2 TABS DAILY TO = 75 MG DAILY 05/02/21  Yes Nahser, Wonda Cheng, MD  Multiple Vitamin (MULTIVITAMIN) tablet Take 1 tablet by mouth daily.   Yes [provider]  omeprazole (PRILOSEC) 40 MG capsule TAKE 1 CAPSULE (40 MG TOTAL) BY MOUTH DAILY. 09/06/15  Yes [name], MD  simvastatin (ZOCOR) 40 MG tablet Take 1 tablet (40 mg total) by mouth daily. Needs labs 01/23/22  Yes [name], MD  SYNTHROID 75 MCG tablet TAKE 1 TABLET BY MOUTH EVERY DAY BEFORE BREAKFAST 10/31/21  Yes [name], MD    Family History Family History  Problem Relation Age of Onset   Hypertension Mother    Heart attack Father 63   Heart disease Father    Anuerysm Father    Stroke Sister    Hypertension Sister    Arthritis Sister    Lupus Sister    Cancer Sister    Breast cancer Sister        Age 67   Rheum arthritis Sister    Stroke Sister    Cancer Brother 44       melanoma   Melanoma Brother    Prostate cancer Brother    Leukemia Brother    Pulmonary fibrosis Brother    Arthritis Daughter        Rheumatoid   Hyperparathyroidism Daughter     Social History Social History   Tobacco Use   Smoking status: Former    Packs/day: 0.10    Years: 18.00    Total pack years: 1.80    Types: Cigarettes    Quit date: 01/27/1979    Years since quitting: 43.1    Passive exposure: Past   Smokeless tobacco: Never  Vaping Use   Vaping Use: Never used  Substance Use Topics   Alcohol  use: Yes    Alcohol/week: 1.0 standard drink of alcohol    Types: 1 Glasses of wine per week    Comment: 2 glasses of wine a month   Drug use: No     Allergies  Amoxicillin-pot clavulanate, Ceftriaxone sodium, Cephalexin, Codeine, Molds & smuts, and Zofran [ondansetron hcl]   Review of Systems Review of Systems See HPI  Physical Exam Triage Vital Signs ED Triage Vitals  Enc Vitals Group     BP 03/20/22 1403 128/76     Pulse Rate 03/20/22 1403 78     Resp 03/20/22 1403 18     Temp 03/20/22 1403 98.6 F (37 C)     Temp Source 03/20/22 1403 Oral     SpO2 03/20/22 1403 98 %     Weight 03/20/22 1404 182 lb (82.6 kg)     Height 03/20/22 1404 '5\' 5"'$  (1.651 m)     Head Circumference --      Peak Flow --      Pain Score 03/20/22 1404 3     Pain Loc --      Pain Edu? --      Excl. in [place]? --    No data found.  Updated Vital Signs BP 128/76 (BP Location: Right Arm)   Pulse 78   Temp 98.6 F (37 C) (Oral)   Resp 18   Ht '5\' 5"'$  (1.651 m)   Wt 82.6 kg   LMP 09/08/2000   SpO2 98%   BMI 30.29 kg/m   Physical Exam Constitutional:      General: She is not in acute distress.    Appearance: She is well-developed.  HENT:     Head: Normocephalic and atraumatic.  Eyes:     Conjunctiva/sclera: Conjunctivae normal.     Pupils: Pupils are equal, round, and reactive to light.  Cardiovascular:     Rate and Rhythm: Normal rate.  Pulmonary:     Effort: Pulmonary effort is normal. No respiratory distress.  Abdominal:     General: There is no distension.     Palpations: Abdomen is soft.     Tenderness: There is no abdominal tenderness. There is no right CVA tenderness or left CVA tenderness.  Musculoskeletal:        General: Normal range of motion.     Cervical back: Normal range of motion.  Skin:    General: Skin is warm and dry.  Neurological:     Mental Status: She is alert.      UC Treatments / Results  Labs (all labs ordered are listed, but only abnormal results  are displayed) Labs Reviewed  POCT URINALYSIS DIP (MANUAL ENTRY) - Abnormal; Notable for the following components:      Result Value   Spec Grav, UA <=1.005 (*)    All other components within normal limits  URINE CULTURE    EKG   Radiology No results found.  Procedures Procedures (including critical care time)  Medications Ordered in UC Medications - No data to display  Initial Impression / Assessment and Plan / UC Course  I have reviewed the triage vital signs and the nursing notes.  Pertinent labs & imaging results that were available during my care of the patient were reviewed by me and considered in my medical decision making (see chart for details).     Patient's urinalysis is normal.  It does not support a diagnosis of urinary tract infection, however, patient has symptoms that she finds typical for her UTIs I will treat pending culture report Final Clinical Impressions(s) / UC Diagnoses   Final diagnoses:  Urinary frequency  Dysuria     Discharge Instructions      Drink lots of water Take antibiotic 2 times a day  Take Tylenol or your Mobic as needed for pain Follow-up with your primary care doctor  Your urine culture report will be available in MyChart in 2 to 3 days   ED Prescriptions     Medication Sig Dispense Auth. Provider   ciprofloxacin (CIPRO) 500 MG tablet Take 1 tablet (500 mg total) by mouth 2 (two) times daily. 14 tablet Raylene Everts, MD      PDMP not reviewed this encounter.   Raylene Everts, MD 03/20/22 [phone]

## 2022-03-20 NOTE — ED Triage Notes (Signed)
Patient c/o possible UTI, urinary frequency, dysuria and low back pain x 4 days.  Patient has taken Tylenol for the discomfort.

## 2022-03-21 ENCOUNTER — Ambulatory Visit: Payer: Medicare Other | Admitting: Family Medicine

## 2022-03-21 LAB — URINE CULTURE: Culture: <10000 — AB

## 2022-03-30 ENCOUNTER — Ambulatory Visit
Admission: RE | Admit: 2022-03-30 | Discharge: 2022-03-30 | Disposition: A | Discharge status: Home / Self Care | Payer: Medicare Other | Source: Ambulatory Visit | Attending: Family Medicine | Admitting: Family Medicine

## 2022-03-30 DIAGNOSIS — Z1231 Encounter for screening mammogram for malignant neoplasm of breast: Secondary | ICD-10-CM

## 2022-03-31 NOTE — Progress Notes (Signed)
Please call patient. Normal mammogram.  Repeat in 1 year.  

## 2022-04-06 ENCOUNTER — Ambulatory Visit (INDEPENDENT_AMBULATORY_CARE_PROVIDER_SITE_OTHER): Payer: Medicare Other | Admitting: Family Medicine

## 2022-04-06 ENCOUNTER — Ambulatory Visit (INDEPENDENT_AMBULATORY_CARE_PROVIDER_SITE_OTHER): Payer: Medicare Other

## 2022-04-06 VITALS — BP 133/66 | HR 80 | Ht 65.0 in | Wt 187.0 lb

## 2022-04-06 DIAGNOSIS — E039 Hypothyroidism, unspecified: Secondary | ICD-10-CM

## 2022-04-06 DIAGNOSIS — M5410 Radiculopathy, site unspecified: Secondary | ICD-10-CM | POA: Diagnosis not present

## 2022-04-06 DIAGNOSIS — M25551 Pain in right hip: Secondary | ICD-10-CM

## 2022-04-06 DIAGNOSIS — M545 Low back pain, unspecified: Secondary | ICD-10-CM | POA: Diagnosis not present

## 2022-04-06 DIAGNOSIS — I10 Essential (primary) hypertension: Secondary | ICD-10-CM

## 2022-04-06 DIAGNOSIS — M1611 Unilateral primary osteoarthritis, right hip: Secondary | ICD-10-CM | POA: Diagnosis not present

## 2022-04-06 DIAGNOSIS — M8589 Other specified disorders of bone density and structure, multiple sites: Secondary | ICD-10-CM

## 2022-04-06 DIAGNOSIS — R5383 Other fatigue: Secondary | ICD-10-CM | POA: Diagnosis not present

## 2022-04-06 DIAGNOSIS — E538 Deficiency of other specified B group vitamins: Secondary | ICD-10-CM

## 2022-04-06 DIAGNOSIS — R200 Anesthesia of skin: Secondary | ICD-10-CM | POA: Diagnosis not present

## 2022-04-06 DIAGNOSIS — M8588 Other specified disorders of bone density and structure, other site: Secondary | ICD-10-CM | POA: Diagnosis not present

## 2022-04-06 DIAGNOSIS — M4316 Spondylolisthesis, lumbar region: Secondary | ICD-10-CM | POA: Diagnosis not present

## 2022-04-06 MED ORDER — PREDNISONE 20 MG PO TABS: 40.0000 mg | ORAL_TABLET | Freq: Every day | ORAL | 0 refills | Status: DC

## 2022-04-06 MED ORDER — TRAMADOL HCL 50 MG PO TABS: 50.0000 mg | ORAL_TABLET | Freq: Every evening | ORAL | 0 refills | Status: DC | PRN

## 2022-04-06 NOTE — Assessment & Plan Note (Signed)
Anne [name] is confirmed on DEXA scan earlier this year.  Plan to check vitamin D level to make sure that she is getting adequate "supplementation we also discussed weightbearing exercise.

## 2022-04-06 NOTE — Progress Notes (Signed)
Established Patient Office Visit  Subjective   Patient ID: Anne [name], female    DOB: 06/17/1951  Age: 71 y.o. MRN: [id number]  Chief Complaint  Patient presents with   Follow-up   Hypertension   Hypothyroidism    HPI  Hypothyroidism - Taking medication regularly in the AM away from food and vitamins, etc. No recent change to skin, hair, or energy levels.  Hypertension- Pt denies chest pain, SOB, dizziness, or heart palpitations.  Taking meds as directed w/o problems.  Denies medication side effects.    Starting 2 weeks ago she started noticing some numbness in her right leg and thigh.  It would radiate down towards her foot.  She has some pain particularly in the thigh in the groin area and has been sore to touch.  She says it is difficult to step up on things.  She does not member any trauma or injury that caused it.  She is also had some pain in her low back especially with turning and it seems to be worse at night she has tried ice and Tylenol without significant relief.  She says the pain in the right groin crease throbs at night and has kept her awake.  She is also tried some heat to.  Also has some pain on the outside of the right hip.  She says she is able to walk during the day it does not limit her functioning but used to seems to be most bothersome at night when she is trying to rest and sleep.  She already takes meloxicam daily.     ROS    Objective:     BP 133/66   Pulse 80   Ht '5\' 5"'$  (1.651 m)   Wt 187 lb (84.8 kg)   LMP 09/08/2000   SpO2 98%   BMI 31.12 kg/m    Physical Exam Vitals and nursing note reviewed.  Constitutional:      Appearance: She is well-developed.  HENT:     Head: Normocephalic and atraumatic.  Cardiovascular:     Rate and Rhythm: Normal rate and regular rhythm.     Heart sounds: Normal heart sounds.  Pulmonary:     Effort: Pulmonary effort is normal.     Breath sounds: Normal breath sounds.  Musculoskeletal:      Comments: Right hip with normal flexion and extension though she had some discomfort with flexion.  She had discomfort on the outside of her hip near the trochanteric bursa with internal rotation of the hip.  Nontender over the lumbar spine or paraspinous muscles.  Nontender over the SI joints.  Hip, knee, ankle strength is 5 out of 5.  Tender just distal to the trochanteric bursa.  Skin:    General: Skin is warm and dry.  Neurological:     Mental Status: She is alert and oriented to person, place, and time.  Psychiatric:        Behavior: Behavior normal.      No results found for any visits on 04/06/22.    The 10-year ASCVD risk score (Arnett DK, et al., 2019) is: 18.1%    Assessment & Plan:   Problem List Items Addressed This Visit       Cardiovascular and Mediastinum   Essential hypertension - Primary    Well controlled. Continue current regimen. Follow up in  6 mo       Relevant Orders   DG Lumbar Spine Complete   Vitamin B1   BASIC METABOLIC  PANEL WITH GFR   TSH     Endocrine   Hypothyroidism    Due to recheck TSH.       Relevant Orders   DG Lumbar Spine Complete   Vitamin B1   BASIC METABOLIC PANEL WITH GFR   TSH     Musculoskeletal and Integument   Osteopenia    Anne [name] is confirmed on DEXA scan earlier this year.  Plan to check vitamin D level to make sure that she is getting adequate "supplementation we also discussed weightbearing exercise.      Relevant Orders   DG Lumbar Spine Complete   Vitamin B1   BASIC METABOLIC PANEL WITH GFR   TSH   VITAMIN D 25 Hydroxy (Vit-D Deficiency, Fractures)   Other Visit Diagnoses     Acute low back pain, unspecified back pain laterality, unspecified whether sciatica present       Relevant Medications   predniSONE (DELTASONE) 20 MG tablet   traMADol (ULTRAM) 50 MG tablet   Other fatigue       Relevant Orders   DG Lumbar Spine Complete   Vitamin B1   BASIC METABOLIC PANEL WITH GFR   TSH   B12 deficiency        Relevant Orders   Vitamin B1   Right hip pain       Relevant Medications   predniSONE (DELTASONE) 20 MG tablet   Other Relevant Orders   DG Hip Unilat W OR W/O Pelvis 2-3 Views Right   Numbness of right foot          I really feel that she has 2 separate issues going on I think there are some issue with the back and some radiculopathy as well as some right hip pain especially with the groin crease discomfort.  We will get x-rays of the right hip as well to see if she may have some advanced arthritis in that hip she would probably be a good candidate for physical therapy.   Definitely had some low back pain with numbness in the right foot which is consistent with radiculopathy we will get x-ray of the lumbar spine for further work-up.  Also given a round of prednisone to take to see if this helps as well since the Tylenol and meloxicam are not helping.  Return in about 6 months (around 10/06/2022) for Hypertension, thyroid .   I spent 40 minutes on the day of the encounter to include pre-visit record review, face-to-face time with the patient and post visit ordering of test.   Anne Lecher, MD

## 2022-04-06 NOTE — Assessment & Plan Note (Signed)
Well controlled. Continue current regimen. Follow up in  6 mo  

## 2022-04-06 NOTE — Assessment & Plan Note (Signed)
Due to recheck TSH. 

## 2022-04-07 NOTE — Progress Notes (Signed)
HI Anne [name], your metabolic panel is normal. Your thyroid is normal. Vitamin D is OK. B1 is till pending.

## 2022-04-08 NOTE — Progress Notes (Signed)
HI Anne [name], you do have some mild arthritis in your right hip. There is  some mild joint space narrowing and spurring.  I think you would do well with formal physical therapy for your hip.  Is that something you would be open too.

## 2022-04-10 NOTE — Progress Notes (Signed)
Does she want PT

## 2022-04-11 LAB — TSH: TSH: 1.6 m[IU]/L (ref 0.40–4.50)

## 2022-04-11 LAB — BASIC METABOLIC PANEL WITH GFR
BUN: 18 mg/dL (ref 7–25)
CO2: 27 mmol/L (ref 20–32)
Calcium: 9.7 mg/dL (ref 8.6–10.4)
Chloride: 102 mmol/L (ref 98–110)
Creat: 0.87 mg/dL (ref 0.60–1.00)
Glucose, Bld: 93 mg/dL (ref 65–99)
Potassium: 4.7 mmol/L (ref 3.5–5.3)
Sodium: 138 mmol/L (ref 135–146)
eGFR: 70 mL/min/{1.73_m2} (ref >=60)

## 2022-04-11 LAB — VITAMIN B1: Vitamin B1 (Thiamine): 12 nmol/L (ref 8–30)

## 2022-04-11 LAB — VITAMIN D 25 HYDROXY (VIT D DEFICIENCY, FRACTURES): Vit D, 25-Hydroxy: 49 ng/mL (ref 30–100)

## 2022-04-11 NOTE — Progress Notes (Signed)
Hi Batsheva, x-ray of your low back shows some degenerative disc disease at L2-L3 and L4-L5.  You have arthritis in that lumbar spine.  You also have a shifting of the L4 vertebrae on top of the L5 vertebrae so they are slightly out of alignment.  I would highly recommend formal physical therapy to work with a physical therapist.  They can work on specific exercises and stretches to reduce pain and inflammation and help with the radiculopathy.  They can sometimes do additional therapies such as ultrasound, massage, pressure points etc.  They can customize the therapy as you are hopefully improving and make recommendations.  We have a wonderful physical therapy department here in our building or if there is something closer to home let us know.  But I think that would be the next step to get you better.

## 2022-04-11 NOTE — Progress Notes (Signed)
Hi Ellysia, your B1 is back.  It usually takes about a week to get that results in.  It is improving it is up to 12.  Still continue your supplement for at least the next 4 to 6 months.

## 2022-04-12 NOTE — Progress Notes (Signed)
I Did order the ABIs of her lower legs back in April.  But she ended up seeing Dr. Katharina Caper and he ordered the same test that I had ordered back in April.  She did have it completed in May.

## 2022-04-13 ENCOUNTER — Other Ambulatory Visit: Payer: Self-pay | Admitting: *Deleted

## 2022-04-13 DIAGNOSIS — M25551 Pain in right hip: Secondary | ICD-10-CM

## 2022-04-13 DIAGNOSIS — M545 Low back pain, unspecified: Secondary | ICD-10-CM

## 2022-04-19 ENCOUNTER — Encounter: Payer: Self-pay | Admitting: Physical Therapy

## 2022-04-19 ENCOUNTER — Ambulatory Visit: Payer: Medicare Other | Attending: Family Medicine | Admitting: Physical Therapy

## 2022-04-19 ENCOUNTER — Other Ambulatory Visit: Payer: Self-pay

## 2022-04-19 DIAGNOSIS — M6281 Muscle weakness (generalized): Secondary | ICD-10-CM

## 2022-04-19 DIAGNOSIS — M25551 Pain in right hip: Secondary | ICD-10-CM | POA: Diagnosis not present

## 2022-04-19 DIAGNOSIS — M1611 Unilateral primary osteoarthritis, right hip: Secondary | ICD-10-CM | POA: Diagnosis not present

## 2022-04-19 DIAGNOSIS — M47816 Spondylosis without myelopathy or radiculopathy, lumbar region: Secondary | ICD-10-CM | POA: Diagnosis not present

## 2022-04-19 DIAGNOSIS — R2689 Other abnormalities of gait and mobility: Secondary | ICD-10-CM

## 2022-04-19 DIAGNOSIS — M5136 Other intervertebral disc degeneration, lumbar region: Secondary | ICD-10-CM | POA: Insufficient documentation

## 2022-04-19 DIAGNOSIS — M5417 Radiculopathy, lumbosacral region: Secondary | ICD-10-CM

## 2022-04-19 DIAGNOSIS — M419 Scoliosis, unspecified: Secondary | ICD-10-CM | POA: Insufficient documentation

## 2022-04-19 DIAGNOSIS — R262 Difficulty in walking, not elsewhere classified: Secondary | ICD-10-CM | POA: Diagnosis not present

## 2022-04-19 DIAGNOSIS — M545 Low back pain, unspecified: Secondary | ICD-10-CM | POA: Diagnosis not present

## 2022-04-19 NOTE — Therapy (Signed)
OUTPATIENT PHYSICAL THERAPY LOWER EXTREMITY EVALUATION   Patient Name: Anne [name] MRN: [id number] DOB:04/28/1951, 71 y.o., female Today's Date: 04/19/2022   PT End of Session - 04/19/22 1109     Visit Number 1    Number of Visits 12    Date for PT Re-Evaluation 05/31/22    Authorization Type Medicare    PT Start Time 1105    PT Stop Time 1145    PT Time Calculation (min) 40 min    Activity Tolerance Patient tolerated treatment well    Behavior During Therapy [facility] for tasks assessed/performed             Past Medical History:  Diagnosis Date   Acid reflux    Aortic atherosclerosis ([place]) 04/28/2013   CT 08/2017   Arthritis    "joints, hands" (08/30/2017)   Chronic lower back pain    Disc herniation    neck and low back   Family history of adverse reaction to anesthesia    "daughter w/PONV"   Follicular cystitis    Hiatal hernia    High cholesterol    Hyperlipidemia    ELEV CHOLESTEROL   Hypothyroidism    Osteopenia 2014   T score -1.6 FRAX 13%/1%   PAF (paroxysmal atrial fibrillation) ([place]) 08/30/2017   Echo 2/19: EF 55-60, normal wall motion, grade 1 diastolic dysfunction, mild PI, trivial TR //  Nuc 3/19: no ischemia or scar, EF 80 // CHADS2-VASc: 3 // Eliquis 5 bid    Schatzki's ring    Past Surgical History:  Procedure Laterality Date   BREAST BIOPSY Bilateral 1997   "excisional bx both benign"   BREAST CYST ASPIRATION Right ~ [place] Left 2011   CATARACT EXTRACTION W/ INTRAOCULAR LENS IMPLANT Right 12/20/2012   Dr. Haynes Bast, OD   COLONOSCOPY WITH ESOPHAGOGASTRODUODENOSCOPY (EGD)  "several"   "? dilated esophagus" (08/30/2017)   DILATION AND CURETTAGE OF UTERUS  1996   EYE MUSCLE SURGERY Bilateral 1992   HERNIA REPAIR     HYSTEROSCOPY     LAPAROSCOPIC CHOLECYSTECTOMY  09/28/2011   OOPHORECTOMY Right 04/2000   LAP RSO/LYSIS OF ADHESIONS   THYROIDECTOMY  1976   TUBAL LIGATION  1976   VARICOSE  VEIN SURGERY Bilateral 1972   VENTRAL HERNIA REPAIR  06/23/13   Dr. Franz Dell   Patient Active Problem List   Diagnosis Date Noted   Globus sensation 03/29/2021   Dry mouth 03/29/2020   Seborrheic dermatitis of scalp 01/22/2020   Cervical radiculopathy 08/25/2019   History of esophagogastroduodenoscopy (EGD) 02/08/2018   Colon cancer screening 02/08/2018   Essential hypertension 12/25/2017   GERD (gastroesophageal reflux disease) 08/30/2017   Chronic diastolic (congestive) heart failure ([place]) 08/30/2017   History of atrial fibrillation 08/30/2017   Flushing 07/30/2017   Paresthesia of both hands 07/30/2017   Postsurgical hypothyroidism 07/30/2017   Aortic atherosclerosis ([place]) 04/28/2013   S/P cataract extraction 01/16/2013   Pseudophakia of both eyes 12/20/2012   Schatzki's ring 10/16/2012   Spinal stenosis of lumbar region 08/16/2012   Vaginal atrophy 11/14/2011   Graves' disease 05/05/2011   Disc herniation    Osteopenia    PERSONAL HISTORY OF ALLERGY TO LATEX 12/23/2009   UNSPECIFIED DISEASE OF PHARYNX 12/16/2008   Intermittent palpitations 11/10/2008   Hypothyroidism 09/22/2008   Hyperlipidemia 09/22/2008   ALLERGIC RHINITIS 09/22/2008    PCP: Durward Parcel  REFERRING PROVIDER: Beatrice Lecher  REFERRING DIAG: M25.551 (ICD-10-CM) - Right hip pain M54.50 (  ICD-10-CM) - Acute low back pain, unspecified back pain laterality, unspecified whether sciatica present   THERAPY DIAG:  No diagnosis found.  Rationale for Evaluation and Treatment Rehabilitation  ONSET DATE: ~2 weeks ago  SUBJECTIVE:   SUBJECTIVE STATEMENT: Pt reports waking up one morning and having a lot of pain and difficulty standing on her R LE. Pt states once she got up for a little bit she could walk. Every time she would get up/down she would feel the same thing. Now it has gotten to the point that both of her legs -- waist down -- get so tired. Pt reports increased numbness/tingling down R LE all the  way to the bottom of her foot.   PERTINENT HISTORY: Prior back pain  PAIN:  Are you having pain? Yes: NPRS scale: at worst 8, currently 0 but numb /10 Pain location: Pain starts in R buttock and travels across into groin and then top of thigh to the foot Pain description: Aching, numbness/tingling Aggravating factors: Walking, prolonged standing > 5 min Relieving factors: Prednisone  PRECAUTIONS: None  WEIGHT BEARING RESTRICTIONS: No  FALLS:  Has patient fallen in last 6 months? No  LIVING ENVIRONMENT: Lives with: lives with their spouse Lives in: House/apartment Stairs: No Has following equipment at home: None  OCCUPATION: Retired  PLOF: Independent  PATIENT GOALS: Improve pain for mobility   OBJECTIVE:   DIAGNOSTIC FINDINGS: X-rays: Mild right hip osteoarthritis. IMPRESSION: 1. Multilevel degenerative disc disease and facet hypertrophy, prominent at L2-L3 and L4-L5. 2. Moderate levo scoliosis. 3. Approximately 7 mm anterolisthesis of L4 on L5.  PATIENT SURVEYS:  FOTO 60; predicted 19  COGNITION: Overall cognitive status: Within functional limits for tasks assessed     SENSATION: Reports R LE N/T  EDEMA: NONE  MUSCLE LENGTH: Hamstrings: Right 60 deg; Left 60 deg Thomas test: Right 0 deg (increased symptoms into R groin and R thigh); Left 0 deg  POSTURE:  R ASIS slightly higher in standing, with increased anterior rotation  PALPATION: TTP R>L Iliospoas, hip flexor. PA mobs on R PSIS resulted in symptoms on L PSIS  LUMBAR ROM:   Active  A/PROM  eval  Flexion 80% can feel increased symptoms on R  Extension 50% can feel in midback  Right lateral flexion To knee can feel increased symptoms on R  Left lateral flexion 50% feels   Right rotation 100%  Left rotation 100%   (Blank rows = not tested)     LOWER EXTREMITY MMT:  MMT Right eval Left eval  Hip flexion 4- 4+  Hip extension 3- 3  Hip abduction 4+ 4+  Hip adduction    Hip internal  rotation    Hip external rotation    Knee flexion 5 5  Knee extension 4+ 4+  Ankle dorsiflexion    Ankle plantarflexion    Ankle inversion    Ankle eversion     (Blank rows = not tested)  LOWER EXTREMITY SPECIAL TESTS:  Hip special tests: Saralyn Pilar (FABER) test: positive  and Thomas test: positive  Supine to long sit R LE appears long to short indicating anterior rotation  FUNCTIONAL TESTS:  Did not assess  GAIT: Distance walked: 150' Assistive device utilized: None Level of assistance: Complete Independence Comments: Mildly antalgic on R   TODAY'S TREATMENT  DATE: Will initiate next session  PATIENT EDUCATION:  Education details: Exam findings, POC Person educated: Patient Education method: Explanation and Demonstration Education comprehension: verbalized understanding, returned demonstration, and needs further education  HOME EXERCISE PROGRAM: TBD  ASSESSMENT:  CLINICAL IMPRESSION: Patient is a 71 y.o. F who was seen today for physical therapy evaluation and treatment for low back/hip pain with R LE radicular symptoms. Assessment significant for hip weakness, pain, R>L hip tightness and tenderness to palpation likely leading to increased lumbar extension as well as s/s of R anterior inominate rotation resulting in increased femoral nerve impingement. Pt would benefit from PT to address these issues for full return to function.   OBJECTIVE IMPAIRMENTS: Abnormal gait, decreased activity tolerance, decreased balance, decreased mobility, difficulty walking, decreased ROM, decreased strength, increased fascial restrictions, increased muscle spasms, impaired sensation, postural dysfunction, and pain.   ACTIVITY LIMITATIONS: squatting, transfers, bed mobility, and locomotion level  PARTICIPATION LIMITATIONS: cleaning, shopping, and community activity  PERSONAL FACTORS: Age, Fitness, Past/current  experiences, and Time since onset of injury/illness/exacerbation are also affecting patient's functional outcome.   REHAB POTENTIAL: Good  CLINICAL DECISION MAKING: Evolving/moderate complexity  EVALUATION COMPLEXITY: Moderate   GOALS: Goals reviewed with patient? Yes  SHORT TERM GOALS: Target date: 05/17/2022  Pt will be ind with initial HEP Baseline: Goal status: INITIAL  2.  Pt will report no symptoms with Marcello Moores testing Baseline:  Goal status: INITIAL   LONG TERM GOALS: Target date: 06/14/2022   Pt will be ind with initial HEP Baseline:  Goal status: INITIAL  2.  Pt will report >/=50% improved pain with amb Baseline:  Goal status: INITIAL  3.  Pt will report no more N/T in R LE Baseline:  Goal status: INITIAL  4.  Pt will improve FOTO to >/=70 Baseline:  Goal status: INITIAL  5.  Pt will demo at least 4/5 bilat LE strength for improved transfers and mobility Baseline:  Goal status: INITIAL     PLAN:  PT FREQUENCY: 2x/week  PT DURATION: 8 weeks  PLANNED INTERVENTIONS: Therapeutic exercises, Therapeutic activity, Neuromuscular re-education, Balance training, Gait training, Patient/Family education, Self Care, Joint mobilization, Aquatic Therapy, Dry Needling, Electrical stimulation, Spinal mobilization, Cryotherapy, Moist heat, Taping, Traction, Ultrasound, Ionotophoresis '4mg'$ /ml Dexamethasone, Manual therapy, and Re-evaluation  PLAN FOR NEXT SESSION: Initiate HEP, check pelvic alignment and address accordingly. Stretch hip flexors. Manual work through iliopsoas. Hip/core strengthening.    Reine Bristow April Gordy Levan, PT, DPT 04/19/2022, 12:48 PM

## 2022-04-21 ENCOUNTER — Other Ambulatory Visit: Payer: Self-pay | Admitting: Family Medicine

## 2022-04-21 ENCOUNTER — Other Ambulatory Visit: Payer: Self-pay | Admitting: Cardiovascular Disease

## 2022-04-21 DIAGNOSIS — E039 Hypothyroidism, unspecified: Secondary | ICD-10-CM

## 2022-04-24 ENCOUNTER — Ambulatory Visit: Payer: Medicare Other | Admitting: Rehabilitative and Restorative Service Providers"

## 2022-04-25 ENCOUNTER — Ambulatory Visit: Payer: Medicare Other | Admitting: Rehabilitative and Restorative Service Providers"

## 2022-04-25 ENCOUNTER — Encounter: Payer: Self-pay | Admitting: Rehabilitative and Restorative Service Providers"

## 2022-04-25 DIAGNOSIS — M545 Low back pain, unspecified: Secondary | ICD-10-CM | POA: Diagnosis not present

## 2022-04-25 DIAGNOSIS — M25551 Pain in right hip: Secondary | ICD-10-CM | POA: Diagnosis not present

## 2022-04-25 DIAGNOSIS — M6281 Muscle weakness (generalized): Secondary | ICD-10-CM

## 2022-04-25 DIAGNOSIS — M1611 Unilateral primary osteoarthritis, right hip: Secondary | ICD-10-CM | POA: Diagnosis not present

## 2022-04-25 DIAGNOSIS — M5417 Radiculopathy, lumbosacral region: Secondary | ICD-10-CM

## 2022-04-25 DIAGNOSIS — M47816 Spondylosis without myelopathy or radiculopathy, lumbar region: Secondary | ICD-10-CM | POA: Diagnosis not present

## 2022-04-25 DIAGNOSIS — R2689 Other abnormalities of gait and mobility: Secondary | ICD-10-CM

## 2022-04-25 DIAGNOSIS — M419 Scoliosis, unspecified: Secondary | ICD-10-CM | POA: Diagnosis not present

## 2022-04-25 DIAGNOSIS — M5441 Lumbago with sciatica, right side: Secondary | ICD-10-CM

## 2022-04-25 DIAGNOSIS — R262 Difficulty in walking, not elsewhere classified: Secondary | ICD-10-CM

## 2022-04-25 DIAGNOSIS — M5136 Other intervertebral disc degeneration, lumbar region: Secondary | ICD-10-CM | POA: Diagnosis not present

## 2022-04-25 DIAGNOSIS — R293 Abnormal posture: Secondary | ICD-10-CM

## 2022-04-25 NOTE — Therapy (Signed)
OUTPATIENT PHYSICAL THERAPY LOWER EXTREMITY TREATMENT   Patient Name: Anne [name] MRN: [id number] DOB:[birth year], 71 y.o., female Today's Date: 04/25/2022   PT End of Session - 04/25/22 1157     Visit Number 2    Number of Visits 12    Date for PT Re-Evaluation 05/31/22    Authorization Type Medicare    Progress Note Due on Visit 10    PT Start Time 6283    PT Stop Time 1517    PT Time Calculation (min) 49 min    Activity Tolerance Patient tolerated treatment well             Past Medical History:  Diagnosis Date   Acid reflux    Aortic atherosclerosis ([place]) 04/28/2013   CT 08/2017   Arthritis    "joints, hands" (08/30/2017)   Chronic lower back pain    Disc herniation    neck and low back   Family history of adverse reaction to anesthesia    "daughter w/PONV"   Follicular cystitis    Hiatal hernia    High cholesterol    Hyperlipidemia    ELEV CHOLESTEROL   Hypothyroidism    Osteopenia 2014   T score -1.6 FRAX 13%/1%   PAF (paroxysmal atrial fibrillation) ([place]) 08/30/2017   Echo 2/19: EF 55-60, normal wall motion, grade 1 diastolic dysfunction, mild PI, trivial TR //  Nuc 3/19: no ischemia or scar, EF 80 // CHADS2-VASc: 3 // Eliquis 5 bid    Schatzki's ring    Past Surgical History:  Procedure Laterality Date   BREAST BIOPSY Bilateral 1997   "excisional bx both benign"   BREAST CYST ASPIRATION Right ~ [place] Left 2011   CATARACT EXTRACTION W/ INTRAOCULAR LENS IMPLANT Right 12/20/2012   Dr. Haynes Bast, OD   COLONOSCOPY WITH ESOPHAGOGASTRODUODENOSCOPY (EGD)  "several"   "? dilated esophagus" (08/30/2017)   DILATION AND CURETTAGE OF UTERUS  1996   EYE MUSCLE SURGERY Bilateral 1992   HERNIA REPAIR     HYSTEROSCOPY     LAPAROSCOPIC CHOLECYSTECTOMY  09/28/2011   OOPHORECTOMY Right 04/2000   LAP RSO/LYSIS OF ADHESIONS   THYROIDECTOMY  1976   TUBAL LIGATION  1976   VARICOSE VEIN SURGERY Bilateral 1972    VENTRAL HERNIA REPAIR  06/23/13   Dr. Franz Dell   Patient Active Problem List   Diagnosis Date Noted   Globus sensation 03/29/2021   Dry mouth 03/29/2020   Seborrheic dermatitis of scalp 01/22/2020   Cervical radiculopathy 08/25/2019   History of esophagogastroduodenoscopy (EGD) 02/08/2018   Colon cancer screening 02/08/2018   Essential hypertension 12/25/2017   GERD (gastroesophageal reflux disease) 08/30/2017   Chronic diastolic (congestive) heart failure ([place]) 08/30/2017   History of atrial fibrillation 08/30/2017   Flushing 07/30/2017   Paresthesia of both hands 07/30/2017   Postsurgical hypothyroidism 07/30/2017   Aortic atherosclerosis ([place]) 04/28/2013   S/P cataract extraction 01/16/2013   Pseudophakia of both eyes 12/20/2012   Schatzki's ring 10/16/2012   Spinal stenosis of lumbar region 08/16/2012   Vaginal atrophy 11/14/2011   Graves' disease 05/05/2011   Disc herniation    Osteopenia    PERSONAL HISTORY OF ALLERGY TO LATEX 12/23/2009   UNSPECIFIED DISEASE OF PHARYNX 12/16/2008   Intermittent palpitations 11/10/2008   Hypothyroidism 09/22/2008   Hyperlipidemia 09/22/2008   ALLERGIC RHINITIS 09/22/2008    PCP: Durward Parcel  REFERRING PROVIDER: Beatrice Lecher  REFERRING DIAG: M25.551 (ICD-10-CM) - Right hip pain M54.50 (ICD-10-CM) -  Acute low back pain, unspecified back pain laterality, unspecified whether sciatica present   THERAPY DIAG:  Radiculopathy, lumbosacral region  Difficulty in walking, not elsewhere classified  Other abnormalities of gait and mobility  Muscle weakness (generalized)  Abnormal posture  Acute bilateral low back pain with right-sided sciatica  Rationale for Evaluation and Treatment Rehabilitation  ONSET DATE: ~2 weeks ago  SUBJECTIVE:   SUBJECTIVE STATEMENT: 04/25/22: patient reports that she has noticed some improvement in the radiating pain down her Rt LE. The medication helped. She noticed that she had less soreness and  pain in the Rt hip and groin with less pain down the Rt LE. Radicular pain is worse in the morning and a little less during the day.   EVAL: Pt reports waking up one morning and having a lot of pain and difficulty standing on her R LE. Pt states once she got up for a little bit she could walk. Every time she would get up/down she would feel the same thing. Now it has gotten to the point that both of her legs -- waist down -- get so tired. Pt reports increased numbness/tingling down R LE all the way to the bottom of her foot.   PERTINENT HISTORY: Prior back pain  PAIN:  Are you having pain? Yes: NPRS scale: at worst 4, currently 0 but numb /10 Pain location: Pain starts in R buttock and travels across into groin and then top of thigh to the foot Pain description: Aching, numbness/tingling Aggravating factors: Walking, prolonged standing > 5 min Relieving factors: Prednisone  PRECAUTIONS: None  WEIGHT BEARING RESTRICTIONS: No  FALLS:  Has patient fallen in last 6 months? No  LIVING ENVIRONMENT: Lives with: lives with their spouse Lives in: House/apartment Stairs: No Has following equipment at home: None  OCCUPATION: Retired  PLOF: Independent  PATIENT GOALS: Improve pain for mobility   OBJECTIVE:   DIAGNOSTIC FINDINGS: X-rays: Mild right hip osteoarthritis. IMPRESSION: 1. Multilevel degenerative disc disease and facet hypertrophy, prominent at L2-L3 and L4-L5. 2. Moderate levo scoliosis. 3. Approximately 7 mm anterolisthesis of L4 on L5.  PATIENT SURVEYS:  FOTO 60; predicted 76  COGNITION: Overall cognitive status: Within functional limits for tasks assessed     SENSATION: Reports R LE N/T  EDEMA: NONE  MUSCLE LENGTH: Hamstrings: Right 60 deg; Left 60 deg Thomas test: Right 0 deg (increased symptoms into R groin and R thigh); Left 0 deg  POSTURE:  R ASIS slightly higher in standing, with increased anterior rotation  PALPATION: TTP R>L Iliospoas, hip flexor.  PA mobs on R PSIS resulted in symptoms on L PSIS  LUMBAR ROM:   Active  A/PROM  eval  Flexion 80% can feel increased symptoms on R  Extension 50% can feel in midback  Right lateral flexion To knee can feel increased symptoms on R  Left lateral flexion 50% feels   Right rotation 100%  Left rotation 100%   (Blank rows = not tested)     LOWER EXTREMITY MMT:  MMT Right eval Left eval  Hip flexion 4- 4+  Hip extension 3- 3  Hip abduction 4+ 4+  Hip adduction    Hip internal rotation    Hip external rotation    Knee flexion 5 5  Knee extension 4+ 4+  Ankle dorsiflexion    Ankle plantarflexion    Ankle inversion    Ankle eversion     (Blank rows = not tested)  LOWER EXTREMITY SPECIAL TESTS:  Hip special tests: Saralyn Pilar (  FABER) test: positive  and Thomas test: positive  Supine to long sit R LE appears long to short indicating anterior rotation  FUNCTIONAL TESTS:  Did not assess  GAIT: Distance walked: 150' Assistive device utilized: None Level of assistance: Complete Independence Comments: Mildly antalgic on R   TODAY'S TREATMENT                                                                          DATE: 04/25/22:  Manual work: deep tissue work and myofacial release work through [employer] posterior hip in piriformis/glut med/min and bilat iliopsoas   Exercises - Supine Transversus Abdominis Bracing with Pelvic Floor Contraction  - 2 x daily - 7 x weekly - 1 sets - 10 reps - 10sec  hold - Standing Lumbar Extension  - 2 x daily - 7 x weekly - 1 sets - 2-3 reps - 2-3 sec  hold - myofacial ball release work standing - decompressioin at home lying sidelying or supine hooklying 1 hour 3 times/day(once can be at night)  Modalities:  MH lumbar spine/abdominal area x 10 min   PATIENT EDUCATION:  Education details: Exam findings, POC Person educated: Patient Education method: Customer service manager Education comprehension: verbalized understanding, returned  demonstration, and needs further education  HOME EXERCISE PROGRAM: Access Code: [email or web address] URL: [email or web address] Date: 04/25/2022 Prepared by: Gillermo Murdoch  Exercises - Supine Transversus Abdominis Bracing with Pelvic Floor Contraction  - 2 x daily - 7 x weekly - 1 sets - 10 reps - 10sec  hold - Standing Lumbar Extension  - 2 x daily - 7 x weekly - 1 sets - 2-3 reps - 2-3 sec  hold - myofacial release work -decompression 1 hour 3 times/day ASSESSMENT:  CLINICAL IMPRESSION: 04/25/22: Continued pain but decreased radicular symptoms. Significant tightness and tenderness to palpation through the Rt posterior hip - piriformis, glut med, glut min and Rt > Lt iliopsoas musculature. Patient interested in trial of traction which has helped in the past.   EVAL: Patient is a 71 y.o. F who was seen today for physical therapy evaluation and treatment for low back/hip pain with R LE radicular symptoms. Assessment significant for hip weakness, pain, R>L hip tightness and tenderness to palpation likely leading to increased lumbar extension as well as s/s of R anterior inominate rotation resulting in increased femoral nerve impingement. Pt would benefit from PT to address these issues for full return to function.   OBJECTIVE IMPAIRMENTS: Abnormal gait, decreased activity tolerance, decreased balance, decreased mobility, difficulty walking, decreased ROM, decreased strength, increased fascial restrictions, increased muscle spasms, impaired sensation, postural dysfunction, and pain.   ACTIVITY LIMITATIONS: squatting, transfers, bed mobility, and locomotion level  PARTICIPATION LIMITATIONS: cleaning, shopping, and community activity  PERSONAL FACTORS: Age, Fitness, Past/current experiences, and Time since onset of injury/illness/exacerbation are also affecting patient's functional outcome.   REHAB POTENTIAL: Good  CLINICAL DECISION MAKING: Evolving/moderate complexity  EVALUATION  COMPLEXITY: Moderate   GOALS: Goals reviewed with patient? Yes  SHORT TERM GOALS: Target date: 05/17/2022  Pt will be ind with initial HEP Baseline: Goal status: INITIAL  2.  Pt will report no symptoms with Marcello Moores testing Baseline:  Goal status: INITIAL   LONG TERM GOALS: Target date: 06/14/2022  Pt will be ind with initial HEP Baseline:  Goal status: INITIAL  2.  Pt will report >/=50% improved pain with amb Baseline:  Goal status: INITIAL  3.  Pt will report no more N/T in R LE Baseline:  Goal status: INITIAL  4.  Pt will improve FOTO to >/=70 Baseline:  Goal status: INITIAL  5.  Pt will demo at least 4/5 bilat LE strength for improved transfers and mobility Baseline:  Goal status: INITIAL     PLAN:  PT FREQUENCY: 2x/week  PT DURATION: 8 weeks  PLANNED INTERVENTIONS: Therapeutic exercises, Therapeutic activity, Neuromuscular re-education, Balance training, Gait training, Patient/Family education, Self Care, Joint mobilization, Aquatic Therapy, Dry Needling, Electrical stimulation, Spinal mobilization, Cryotherapy, Moist heat, Taping, Traction, Ultrasound, Ionotophoresis '4mg'$ /ml Dexamethasone, Manual therapy, and Re-evaluation  PLAN FOR NEXT SESSION: Initiate HEP, check pelvic alignment and address accordingly. Stretch hip flexors. Manual work through iliopsoas. Hip/core strengthening.    Everardo All, PT, MPH 04/25/2022, 11:58 AM

## 2022-04-27 ENCOUNTER — Ambulatory Visit: Payer: Medicare Other | Attending: Family Medicine | Admitting: Rehabilitative and Restorative Service Providers"

## 2022-04-27 ENCOUNTER — Encounter: Payer: Self-pay | Admitting: Rehabilitative and Restorative Service Providers"

## 2022-04-27 DIAGNOSIS — M5417 Radiculopathy, lumbosacral region: Secondary | ICD-10-CM | POA: Insufficient documentation

## 2022-04-27 DIAGNOSIS — M5441 Lumbago with sciatica, right side: Secondary | ICD-10-CM | POA: Diagnosis not present

## 2022-04-27 DIAGNOSIS — R29898 Other symptoms and signs involving the musculoskeletal system: Secondary | ICD-10-CM | POA: Insufficient documentation

## 2022-04-27 DIAGNOSIS — R293 Abnormal posture: Secondary | ICD-10-CM | POA: Insufficient documentation

## 2022-04-27 DIAGNOSIS — M6281 Muscle weakness (generalized): Secondary | ICD-10-CM | POA: Insufficient documentation

## 2022-04-27 DIAGNOSIS — R2689 Other abnormalities of gait and mobility: Secondary | ICD-10-CM | POA: Diagnosis not present

## 2022-04-27 DIAGNOSIS — R262 Difficulty in walking, not elsewhere classified: Secondary | ICD-10-CM | POA: Insufficient documentation

## 2022-04-27 NOTE — Therapy (Signed)
OUTPATIENT PHYSICAL THERAPY LOWER EXTREMITY TREATMENT   Patient Name: Anne [name] MRN: [id number] DOB:01/13/1951, 71 y.o., female Today's Date: 04/27/2022   PT End of Session - 04/27/22 1054     Visit Number 3    Number of Visits 12    Date for PT Re-Evaluation 05/31/22    Authorization Type Medicare    Progress Note Due on Visit 10    PT Start Time 1100    PT Stop Time 1148    PT Time Calculation (min) 48 min    Activity Tolerance Patient tolerated treatment well             Past Medical History:  Diagnosis Date   Acid reflux    Aortic atherosclerosis ([place]) 04/28/2013   CT 08/2017   Arthritis    "joints, hands" (08/30/2017)   Chronic lower back pain    Disc herniation    neck and low back   Family history of adverse reaction to anesthesia    "daughter w/PONV"   Follicular cystitis    Hiatal hernia    High cholesterol    Hyperlipidemia    ELEV CHOLESTEROL   Hypothyroidism    Osteopenia 2014   T score -1.6 FRAX 13%/1%   PAF (paroxysmal atrial fibrillation) ([place]) 08/30/2017   Echo 2/19: EF 55-60, normal wall motion, grade 1 diastolic dysfunction, mild PI, trivial TR //  Nuc 3/19: no ischemia or scar, EF 80 // CHADS2-VASc: 3 // Eliquis 5 bid    Schatzki's ring    Past Surgical History:  Procedure Laterality Date   BREAST BIOPSY Bilateral 1997   "excisional bx both benign"   BREAST CYST ASPIRATION Right ~ [place] Left 2011   CATARACT EXTRACTION W/ INTRAOCULAR LENS IMPLANT Right 12/20/2012   Dr. Haynes Bast, OD   COLONOSCOPY WITH ESOPHAGOGASTRODUODENOSCOPY (EGD)  "several"   "? dilated esophagus" (08/30/2017)   DILATION AND CURETTAGE OF UTERUS  1996   EYE MUSCLE SURGERY Bilateral 1992   HERNIA REPAIR     HYSTEROSCOPY     LAPAROSCOPIC CHOLECYSTECTOMY  09/28/2011   OOPHORECTOMY Right 04/2000   LAP RSO/LYSIS OF ADHESIONS   THYROIDECTOMY  1976   TUBAL LIGATION  1976   VARICOSE VEIN SURGERY Bilateral 1972    VENTRAL HERNIA REPAIR  06/23/13   Dr. Franz Dell   Patient Active Problem List   Diagnosis Date Noted   Globus sensation 03/29/2021   Dry mouth 03/29/2020   Seborrheic dermatitis of scalp 01/22/2020   Cervical radiculopathy 08/25/2019   History of esophagogastroduodenoscopy (EGD) 02/08/2018   Colon cancer screening 02/08/2018   Essential hypertension 12/25/2017   GERD (gastroesophageal reflux disease) 08/30/2017   Chronic diastolic (congestive) heart failure ([place]) 08/30/2017   History of atrial fibrillation 08/30/2017   Flushing 07/30/2017   Paresthesia of both hands 07/30/2017   Postsurgical hypothyroidism 07/30/2017   Aortic atherosclerosis ([place]) 04/28/2013   S/P cataract extraction 01/16/2013   Pseudophakia of both eyes 12/20/2012   Schatzki's ring 10/16/2012   Spinal stenosis of lumbar region 08/16/2012   Vaginal atrophy 11/14/2011   Graves' disease 05/05/2011   Disc herniation    Osteopenia    PERSONAL HISTORY OF ALLERGY TO LATEX 12/23/2009   UNSPECIFIED DISEASE OF PHARYNX 12/16/2008   Intermittent palpitations 11/10/2008   Hypothyroidism 09/22/2008   Hyperlipidemia 09/22/2008   ALLERGIC RHINITIS 09/22/2008    PCP: Durward Parcel  REFERRING PROVIDER: Beatrice Lecher  REFERRING DIAG: M25.551 (ICD-10-CM) - Right hip pain M54.50 (ICD-10-CM) -  Acute low back pain, unspecified back pain laterality, unspecified whether sciatica present   THERAPY DIAG:  Radiculopathy, lumbosacral region  Difficulty in walking, not elsewhere classified  Other abnormalities of gait and mobility  Muscle weakness (generalized)  Abnormal posture  Acute bilateral low back pain with right-sided sciatica  Other symptoms and signs involving the musculoskeletal system  Rationale for Evaluation and Treatment Rehabilitation  ONSET DATE: ~2 weeks ago  SUBJECTIVE:   SUBJECTIVE STATEMENT: 04/27/22: patient reports that she has noticed some improvement in the radiating pain down her Rt LE  but she continues to have symptoms. Overall, she noticed that she had less soreness and pain in the Rt hip and groin with less pain down the Rt LE. Radicular pain is worse in the morning and a little less during the day. Would like to try traction. This has helped in the past.   EVAL: Pt reports waking up one morning and having a lot of pain and difficulty standing on her R LE. Pt states once she got up for a little bit she could walk. Every time she would get up/down she would feel the same thing. Now it has gotten to the point that both of her legs -- waist down -- get so tired. Pt reports increased numbness/tingling down R LE all the way to the bottom of her foot.   PERTINENT HISTORY: Prior back pain  PAIN:  Are you having pain? Yes: NPRS scale: 3/10 Pain location: Pain starts in R buttock and travels across into groin and then top of thigh to the foot in the arch Pain description: Aching, numbness/tingling Aggravating factors: Walking, prolonged standing > 5 min Relieving factors: Prednisone  PRECAUTIONS: None  WEIGHT BEARING RESTRICTIONS: No  FALLS:  Has patient fallen in last 6 months? No  LIVING ENVIRONMENT: Lives with: lives with their spouse Lives in: House/apartment Stairs: No Has following equipment at home: None  OCCUPATION: Retired  PLOF: Independent  PATIENT GOALS: Improve pain for mobility   OBJECTIVE:   DIAGNOSTIC FINDINGS: X-rays: Mild right hip osteoarthritis. IMPRESSION: 1. Multilevel degenerative disc disease and facet hypertrophy, prominent at L2-L3 and L4-L5. 2. Moderate levo scoliosis. 3. Approximately 7 mm anterolisthesis of L4 on L5.  PATIENT SURVEYS:  FOTO 60; predicted 60  COGNITION: Overall cognitive status: Within functional limits for tasks assessed     SENSATION: Reports R LE N/T  EDEMA: NONE  MUSCLE LENGTH: Hamstrings: Right 60 deg; Left 60 deg Thomas test: Right 0 deg (increased symptoms into R groin and R thigh); Left 0  deg  POSTURE:  R ASIS slightly higher in standing, with increased anterior rotation  PALPATION: TTP R>L Iliospoas, hip flexor. PA mobs on R PSIS resulted in symptoms on L PSIS  LUMBAR ROM:   Active  A/PROM  eval  Flexion 80% can feel increased symptoms on R  Extension 50% can feel in midback  Right lateral flexion To knee can feel increased symptoms on R  Left lateral flexion 50% feels   Right rotation 100%  Left rotation 100%   (Blank rows = not tested)     LOWER EXTREMITY MMT:  MMT Right eval Left eval  Hip flexion 4- 4+  Hip extension 3- 3  Hip abduction 4+ 4+  Hip adduction    Hip internal rotation    Hip external rotation    Knee flexion 5 5  Knee extension 4+ 4+  Ankle dorsiflexion    Ankle plantarflexion    Ankle inversion    Ankle  eversion     (Blank rows = not tested)  LOWER EXTREMITY SPECIAL TESTS:  Hip special tests: Saralyn Pilar (FABER) test: positive  and Thomas test: positive  Supine to long sit R LE appears long to short indicating anterior rotation  FUNCTIONAL TESTS:  Did not assess  GAIT: Distance walked: 150' Assistive device utilized: None Level of assistance: Complete Independence Comments: Mildly antalgic on R   TODAY'S TREATMENT                                                                          DATE: 04/27/22:  Manual work:  Deep tissue work to pt tolerance through the psoas musculature pt supine hooklying   Therapeutic exercise: Transverse abdominal isometric(4 part core 10 sec x 10  Walking in gym working on activation of core with gait Hip flexor stretch sitting 20-30 sec x 2 each side  Standing facing wall glut set 10 sec x 5   Modalities: Lumbar traction - intermittent Max 60# Min 45# Hold 30 sec  Release 10 sec  Time X 10 min  Moist heat with traction    04/25/22:  Manual work: deep tissue work and myofacial release work through [employer] posterior hip in piriformis/glut med/min and bilat iliopsoas   Exercises -  Supine Transversus Abdominis Bracing with Pelvic Floor Contraction  - 2 x daily - 7 x weekly - 1 sets - 10 reps - 10sec  hold - Standing Lumbar Extension  - 2 x daily - 7 x weekly - 1 sets - 2-3 reps - 2-3 sec  hold - myofacial ball release work standing - decompressioin at home lying sidelying or supine hooklying 1 hour 3 times/day(once can be at night)  Modalities:  MH lumbar spine/abdominal area x 10 min   PATIENT EDUCATION:  Education details: Exam findings, POC Person educated: Patient Education method: Customer service manager Education comprehension: verbalized understanding, returned demonstration, and needs further education  HOME EXERCISE PROGRAM: Access Code: [email or web address] URL: [email or web address] Date: 04/27/2022 Prepared by: Gillermo Murdoch  Exercises - Supine Transversus Abdominis Bracing with Pelvic Floor Contraction  - 2 x daily - 7 x weekly - 1 sets - 10 reps - 10sec  hold - Standing Lumbar Extension  - 2 x daily - 7 x weekly - 1 sets - 2-3 reps - 2-3 sec  hold - Seated Hip Flexor Stretch  - 2 x daily - 7 x weekly - 1 sets - 3 reps - 30 sec  hold - Standing Gluteal Sets  - 2 x daily - 7 x weekly - 1 sets - 10 reps - 10 sec  hold ASSESSMENT:  CLINICAL IMPRESSION: 04/27/22: Continued pain but slightly decreased radicular symptoms; sometimes worse than others. Significant tightness and tenderness to palpation through the Rt posterior hip - piriformis, glut med, glut min and Rt > Lt iliopsoas musculature. Trial manual work Rt > Lt psoas and trial of lumbar traction.   EVAL: Patient is a 71 y.o. F who was seen today for physical therapy evaluation and treatment for low back/hip pain with R LE radicular symptoms. Assessment significant for hip weakness, pain, R>L hip tightness and tenderness to palpation likely leading to increased lumbar extension as well as s/s of R anterior  inominate rotation resulting in increased femoral nerve impingement. Pt would benefit from  PT to address these issues for full return to function.   OBJECTIVE IMPAIRMENTS: Abnormal gait, decreased activity tolerance, decreased balance, decreased mobility, difficulty walking, decreased ROM, decreased strength, increased fascial restrictions, increased muscle spasms, impaired sensation, postural dysfunction, and pain.   ACTIVITY LIMITATIONS: squatting, transfers, bed mobility, and locomotion level  PARTICIPATION LIMITATIONS: cleaning, shopping, and community activity  PERSONAL FACTORS: Age, Fitness, Past/current experiences, and Time since onset of injury/illness/exacerbation are also affecting patient's functional outcome.   REHAB POTENTIAL: Good  CLINICAL DECISION MAKING: Evolving/moderate complexity  EVALUATION COMPLEXITY: Moderate   GOALS: Goals reviewed with patient? Yes  SHORT TERM GOALS: Target date: 05/17/2022  Pt will be ind with initial HEP Baseline: Goal status: INITIAL  2.  Pt will report no symptoms with Marcello Moores testing Baseline:  Goal status: INITIAL   LONG TERM GOALS: Target date: 06/14/2022   Pt will be ind with initial HEP Baseline:  Goal status: INITIAL  2.  Pt will report >/=50% improved pain with amb Baseline:  Goal status: INITIAL  3.  Pt will report no more N/T in R LE Baseline:  Goal status: INITIAL  4.  Pt will improve FOTO to >/=70 Baseline:  Goal status: INITIAL  5.  Pt will demo at least 4/5 bilat LE strength for improved transfers and mobility Baseline:  Goal status: INITIAL     PLAN:  PT FREQUENCY: 2x/week  PT DURATION: 8 weeks  PLANNED INTERVENTIONS: Therapeutic exercises, Therapeutic activity, Neuromuscular re-education, Balance training, Gait training, Patient/Family education, Self Care, Joint mobilization, Aquatic Therapy, Dry Needling, Electrical stimulation, Spinal mobilization, Cryotherapy, Moist heat, Taping, Traction, Ultrasound, Ionotophoresis '4mg'$ /ml Dexamethasone, Manual therapy, and Re-evaluation  PLAN  FOR NEXT SESSION: Progress HEP, check pelvic alignment and address as indicated. Stretch hip flexors. Manual work through iliopsoas. Hip/core strengthening.    Everardo All, PT, MPH 04/27/2022, 10:55 AM

## 2022-05-04 ENCOUNTER — Ambulatory Visit: Payer: Medicare Other | Admitting: Rehabilitative and Restorative Service Providers"

## 2022-05-09 ENCOUNTER — Encounter: Payer: Self-pay | Admitting: Rehabilitative and Restorative Service Providers"

## 2022-05-09 ENCOUNTER — Ambulatory Visit: Payer: Medicare Other | Admitting: Rehabilitative and Restorative Service Providers"

## 2022-05-09 DIAGNOSIS — R262 Difficulty in walking, not elsewhere classified: Secondary | ICD-10-CM

## 2022-05-09 DIAGNOSIS — R2689 Other abnormalities of gait and mobility: Secondary | ICD-10-CM | POA: Diagnosis not present

## 2022-05-09 DIAGNOSIS — M5441 Lumbago with sciatica, right side: Secondary | ICD-10-CM

## 2022-05-09 DIAGNOSIS — M5417 Radiculopathy, lumbosacral region: Secondary | ICD-10-CM

## 2022-05-09 DIAGNOSIS — R293 Abnormal posture: Secondary | ICD-10-CM

## 2022-05-09 DIAGNOSIS — M6281 Muscle weakness (generalized): Secondary | ICD-10-CM | POA: Diagnosis not present

## 2022-05-09 NOTE — Therapy (Signed)
OUTPATIENT PHYSICAL THERAPY LOWER EXTREMITY TREATMENT   Patient Name: Anne [name] MRN: [id number] DOB:1950/09/02, 71 y.o., female Today's Date: 05/09/2022   PT End of Session - 05/09/22 1056     Visit Number 4    Number of Visits 12    Date for PT Re-Evaluation 05/31/22    Authorization Type Medicare    Progress Note Due on Visit 10    PT Start Time 1155    PT Stop Time 1245    PT Time Calculation (min) 50 min    Activity Tolerance Patient tolerated treatment well             Past Medical History:  Diagnosis Date   Acid reflux    Aortic atherosclerosis ([place]) 04/28/2013   CT 08/2017   Arthritis    "joints, hands" (08/30/2017)   Chronic lower back pain    Disc herniation    neck and low back   Family history of adverse reaction to anesthesia    "daughter w/PONV"   Follicular cystitis    Hiatal hernia    High cholesterol    Hyperlipidemia    ELEV CHOLESTEROL   Hypothyroidism    Osteopenia 2014   T score -1.6 FRAX 13%/1%   PAF (paroxysmal atrial fibrillation) ([place]) 08/30/2017   Echo 2/19: EF 55-60, normal wall motion, grade 1 diastolic dysfunction, mild PI, trivial TR //  Nuc 3/19: no ischemia or scar, EF 80 // CHADS2-VASc: 3 // Eliquis 5 bid    Schatzki's ring    Past Surgical History:  Procedure Laterality Date   BREAST BIOPSY Bilateral 1997   "excisional bx both benign"   BREAST CYST ASPIRATION Right ~ [place] Left 2011   CATARACT EXTRACTION W/ INTRAOCULAR LENS IMPLANT Right 12/20/2012   Dr. Haynes Bast, OD   COLONOSCOPY WITH ESOPHAGOGASTRODUODENOSCOPY (EGD)  "several"   "? dilated esophagus" (08/30/2017)   DILATION AND CURETTAGE OF UTERUS  1996   EYE MUSCLE SURGERY Bilateral 1992   HERNIA REPAIR     HYSTEROSCOPY     LAPAROSCOPIC CHOLECYSTECTOMY  09/28/2011   OOPHORECTOMY Right 04/2000   LAP RSO/LYSIS OF ADHESIONS   THYROIDECTOMY  1976   TUBAL LIGATION  1976   VARICOSE VEIN SURGERY Bilateral 1972    VENTRAL HERNIA REPAIR  06/23/13   Dr. Franz Dell   Patient Active Problem List   Diagnosis Date Noted   Globus sensation 03/29/2021   Dry mouth 03/29/2020   Seborrheic dermatitis of scalp 01/22/2020   Cervical radiculopathy 08/25/2019   History of esophagogastroduodenoscopy (EGD) 02/08/2018   Colon cancer screening 02/08/2018   Essential hypertension 12/25/2017   GERD (gastroesophageal reflux disease) 08/30/2017   Chronic diastolic (congestive) heart failure ([place]) 08/30/2017   History of atrial fibrillation 08/30/2017   Flushing 07/30/2017   Paresthesia of both hands 07/30/2017   Postsurgical hypothyroidism 07/30/2017   Aortic atherosclerosis ([place]) 04/28/2013   S/P cataract extraction 01/16/2013   Pseudophakia of both eyes 12/20/2012   Schatzki's ring 10/16/2012   Spinal stenosis of lumbar region 08/16/2012   Vaginal atrophy 11/14/2011   Graves' disease 05/05/2011   Disc herniation    Osteopenia    PERSONAL HISTORY OF ALLERGY TO LATEX 12/23/2009   UNSPECIFIED DISEASE OF PHARYNX 12/16/2008   Intermittent palpitations 11/10/2008   Hypothyroidism 09/22/2008   Hyperlipidemia 09/22/2008   ALLERGIC RHINITIS 09/22/2008    PCP: Durward Parcel  REFERRING PROVIDER: Beatrice Lecher  REFERRING DIAG: M25.551 (ICD-10-CM) - Right hip pain M54.50 (ICD-10-CM) -  Acute low back pain, unspecified back pain laterality, unspecified whether sciatica present   THERAPY DIAG:  Radiculopathy, lumbosacral region  Difficulty in walking, not elsewhere classified  Other abnormalities of gait and mobility  Muscle weakness (generalized)  Abnormal posture  Acute bilateral low back pain with right-sided sciatica  Rationale for Evaluation and Treatment Rehabilitation  ONSET DATE: ~2 weeks ago  SUBJECTIVE:   SUBJECTIVE STATEMENT: 05/09/22: Patient reports that she had to be at her daughters last week. Her daughter had surgery and patient went to help her daughter. Patient reports that she is  having less pain in the back and less pain into the Rt groin area. She has done some of her exercises.She continues to have some pain into the Rt LE and both feet are "numby" feeling.  Would like to try traction. This has helped in the past.   EVAL: Pt reports waking up one morning and having a lot of pain and difficulty standing on her R LE. Pt states once she got up for a little bit she could walk. Every time she would get up/down she would feel the same thing. Now it has gotten to the point that both of her legs -- waist down -- get so tired. Pt reports increased numbness/tingling down R LE all the way to the bottom of her foot.   PERTINENT HISTORY: Prior back pain  PAIN:  Are you having pain? Yes: NPRS scale: 3/10 Pain location: Pain starts in R buttock and side of hip less into groin and less in top of thigh Pain description: Aching, numbness/tingling Aggravating factors: Walking, prolonged standing > 5 min Relieving factors: Prednisone  PRECAUTIONS: None  WEIGHT BEARING RESTRICTIONS: No  FALLS:  Has patient fallen in last 6 months? No  LIVING ENVIRONMENT: Lives with: lives with their spouse Lives in: House/apartment Stairs: No Has following equipment at home: None  OCCUPATION: Retired  PLOF: Independent  PATIENT GOALS: Improve pain for mobility   OBJECTIVE:   DIAGNOSTIC FINDINGS: X-rays: Mild right hip osteoarthritis. IMPRESSION: 1. Multilevel degenerative disc disease and facet hypertrophy, prominent at L2-L3 and L4-L5. 2. Moderate levo scoliosis. 3. Approximately 7 mm anterolisthesis of L4 on L5.  PATIENT SURVEYS:  FOTO 60; predicted 54  COGNITION: Overall cognitive status: Within functional limits for tasks assessed     SENSATION: Reports R LE N/T  EDEMA: NONE  MUSCLE LENGTH: Hamstrings: Right 60 deg; Left 60 deg Thomas test: Right 0 deg (increased symptoms into R groin and R thigh); Left 0 deg  POSTURE:  R ASIS slightly higher in standing, with  increased anterior rotation  PALPATION: TTP R>L Iliospoas, hip flexor. PA mobs on R PSIS resulted in symptoms on L PSIS  LUMBAR ROM:   Active  A/PROM  eval  Flexion 80% can feel increased symptoms on R  Extension 50% can feel in midback  Right lateral flexion To knee can feel increased symptoms on R  Left lateral flexion 50% feels   Right rotation 100%  Left rotation 100%   (Blank rows = not tested)     LOWER EXTREMITY MMT:  MMT Right eval Left eval  Hip flexion 4- 4+  Hip extension 3- 3  Hip abduction 4+ 4+  Hip adduction    Hip internal rotation    Hip external rotation    Knee flexion 5 5  Knee extension 4+ 4+  Ankle dorsiflexion    Ankle plantarflexion    Ankle inversion    Ankle eversion     (Blank  rows = not tested)  LOWER EXTREMITY SPECIAL TESTS:  Hip special tests: Saralyn Pilar (FABER) test: positive  and Thomas test: positive  Supine to long sit R LE appears long to short indicating anterior rotation  FUNCTIONAL TESTS:  Did not assess  GAIT: Distance walked: 150' Assistive device utilized: None Level of assistance: Complete Independence Comments: Mildly antalgic on R   TODAY'S TREATMENT                                                                          DATE: 05/09/22:  Therapeutic exercise: Nustep L5 x 5 min UE 9; seat 6 Row blue TB x 10 x 2 sets  Antirotation blue TB x 10 each side  Transverse abdominal isometric(4 part core 10 sec x 10  Hip flexor stretch sitting 30 sec x 3 each side  Standing facing wall glut set 5 sec x 10   Modalities: Moist heat x 10 min   04/27/22:  Manual work:  Deep tissue work to pt tolerance through the psoas musculature pt supine hooklying   Therapeutic exercise: Transverse abdominal isometric(4 part core 10 sec x 10  Walking in gym working on activation of core with gait Hip flexor stretch sitting 20-30 sec x 2 each side  Standing facing wall glut set 10 sec x 5   Modalities: Lumbar traction -  intermittent Max 60# Min 45# Hold 30 sec  Release 10 sec  Time X 10 min  Moist heat with traction   PATIENT EDUCATION:  Education details: Exam findings, POC Person educated: Patient Education method: Customer service manager Education comprehension: verbalized understanding, returned demonstration, and needs further education  HOME EXERCISE PROGRAM: Access Code: [email or web address] URL: [email or web address] Date: 05/09/2022 Prepared by: Gillermo Murdoch  Exercises - Supine Transversus Abdominis Bracing with Pelvic Floor Contraction  - 2 x daily - 7 x weekly - 1 sets - 10 reps - 10sec  hold - Standing Lumbar Extension  - 2 x daily - 7 x weekly - 1 sets - 2-3 reps - 2-3 sec  hold - Seated Hip Flexor Stretch  - 2 x daily - 7 x weekly - 1 sets - 3 reps - 30 sec  hold - Standing Gluteal Sets  - 2 x daily - 7 x weekly - 1 sets - 10 reps - 10 sec  hold - Standing Bilateral Low Shoulder Row with Anchored Resistance  - 2 x daily - 7 x weekly - 1-3 sets - 10 reps - 2-3 sec  hold - Anti-Rotation Lateral Stepping with Press  - 2 x daily - 7 x weekly - 1-2 sets - 10 reps - 2-3 sec  hold  ASSESSMENT:  CLINICAL IMPRESSION: 05/09/22: Decreasing pain in back but continued pain in the Rt posterior and lateral hip. She has decreased radicular symptoms with less pain in the groin. Significant tightness and tenderness to palpation through the Rt posterior hip - piriformis, glut med, glut min and Rt > Lt iliopsoas musculature. Gradually progressing toward stated goals of therapy. Consider second trial of lumbar traction as indicated.   EVAL: Patient is a 71 y.o. F who was seen today for physical therapy evaluation and treatment for low back/hip pain with R LE radicular symptoms. Assessment  significant for hip weakness, pain, R>L hip tightness and tenderness to palpation likely leading to increased lumbar extension as well as s/s of R anterior inominate rotation resulting in increased femoral nerve  impingement. Pt would benefit from PT to address these issues for full return to function.   OBJECTIVE IMPAIRMENTS: Abnormal gait, decreased activity tolerance, decreased balance, decreased mobility, difficulty walking, decreased ROM, decreased strength, increased fascial restrictions, increased muscle spasms, impaired sensation, postural dysfunction, and pain.   ACTIVITY LIMITATIONS: squatting, transfers, bed mobility, and locomotion level  PARTICIPATION LIMITATIONS: cleaning, shopping, and community activity  PERSONAL FACTORS: Age, Fitness, Past/current experiences, and Time since onset of injury/illness/exacerbation are also affecting patient's functional outcome.   REHAB POTENTIAL: Good  CLINICAL DECISION MAKING: Evolving/moderate complexity  EVALUATION COMPLEXITY: Moderate   GOALS: Goals reviewed with patient? Yes  SHORT TERM GOALS: Target date: 05/17/2022  Pt will be ind with initial HEP Baseline: Goal status: INITIAL  2.  Pt will report no symptoms with Marcello Moores testing Baseline:  Goal status: INITIAL   LONG TERM GOALS: Target date: 06/14/2022   Pt will be ind with initial HEP Baseline:  Goal status: INITIAL  2.  Pt will report >/=50% improved pain with amb Baseline:  Goal status: INITIAL  3.  Pt will report no more N/T in R LE Baseline:  Goal status: INITIAL  4.  Pt will improve FOTO to >/=70 Baseline:  Goal status: INITIAL  5.  Pt will demo at least 4/5 bilat LE strength for improved transfers and mobility Baseline:  Goal status: INITIAL     PLAN:  PT FREQUENCY: 2x/week  PT DURATION: 8 weeks  PLANNED INTERVENTIONS: Therapeutic exercises, Therapeutic activity, Neuromuscular re-education, Balance training, Gait training, Patient/Family education, Self Care, Joint mobilization, Aquatic Therapy, Dry Needling, Electrical stimulation, Spinal mobilization, Cryotherapy, Moist heat, Taping, Traction, Ultrasound, Ionotophoresis '4mg'$ /ml Dexamethasone, Manual  therapy, and Re-evaluation  PLAN FOR NEXT SESSION: Progress HEP, check pelvic alignment and address as indicated. Stretch hip flexors. Manual work through iliopsoas. Hip/core strengthening.    Everardo All, PT, MPH 05/09/2022, 10:56 AM

## 2022-05-11 ENCOUNTER — Encounter: Payer: Self-pay | Admitting: Rehabilitative and Restorative Service Providers"

## 2022-05-11 ENCOUNTER — Ambulatory Visit: Payer: Medicare Other | Admitting: Rehabilitative and Restorative Service Providers"

## 2022-05-11 DIAGNOSIS — R2689 Other abnormalities of gait and mobility: Secondary | ICD-10-CM | POA: Diagnosis not present

## 2022-05-11 DIAGNOSIS — M6281 Muscle weakness (generalized): Secondary | ICD-10-CM

## 2022-05-11 DIAGNOSIS — M5417 Radiculopathy, lumbosacral region: Secondary | ICD-10-CM | POA: Diagnosis not present

## 2022-05-11 DIAGNOSIS — R293 Abnormal posture: Secondary | ICD-10-CM | POA: Diagnosis not present

## 2022-05-11 DIAGNOSIS — M5441 Lumbago with sciatica, right side: Secondary | ICD-10-CM | POA: Diagnosis not present

## 2022-05-11 DIAGNOSIS — R262 Difficulty in walking, not elsewhere classified: Secondary | ICD-10-CM

## 2022-05-11 NOTE — Therapy (Signed)
OUTPATIENT PHYSICAL THERAPY LOWER EXTREMITY TREATMENT   Patient Name: Anne [name] MRN: [id number] DOB:02-Mar-1951, 71 y.o., female Today's Date: 05/11/2022   PT End of Session - 05/11/22 1022     Visit Number 5    Number of Visits 12    Date for PT Re-Evaluation 05/31/22    Progress Note Due on Visit 10    PT Start Time 1016    PT Stop Time 3614    PT Time Calculation (min) 49 min    Activity Tolerance Patient tolerated treatment well             Past Medical History:  Diagnosis Date   Acid reflux    Aortic atherosclerosis ([place]) 04/28/2013   CT 08/2017   Arthritis    "joints, hands" (08/30/2017)   Chronic lower back pain    Disc herniation    neck and low back   Family history of adverse reaction to anesthesia    "daughter w/PONV"   Follicular cystitis    Hiatal hernia    High cholesterol    Hyperlipidemia    ELEV CHOLESTEROL   Hypothyroidism    Osteopenia 2014   T score -1.6 FRAX 13%/1%   PAF (paroxysmal atrial fibrillation) ([place]) 08/30/2017   Echo 2/19: EF 55-60, normal wall motion, grade 1 diastolic dysfunction, mild PI, trivial TR //  Nuc 3/19: no ischemia or scar, EF 80 // CHADS2-VASc: 3 // Eliquis 5 bid    Schatzki's ring    Past Surgical History:  Procedure Laterality Date   BREAST BIOPSY Bilateral 1997   "excisional bx both benign"   BREAST CYST ASPIRATION Right ~ [place] Left 2011   CATARACT EXTRACTION W/ INTRAOCULAR LENS IMPLANT Right 12/20/2012   Dr. Haynes Bast, OD   COLONOSCOPY WITH ESOPHAGOGASTRODUODENOSCOPY (EGD)  "several"   "? dilated esophagus" (08/30/2017)   DILATION AND CURETTAGE OF UTERUS  1996   EYE MUSCLE SURGERY Bilateral 1992   HERNIA REPAIR     HYSTEROSCOPY     LAPAROSCOPIC CHOLECYSTECTOMY  09/28/2011   OOPHORECTOMY Right 04/2000   LAP RSO/LYSIS OF ADHESIONS   THYROIDECTOMY  1976   TUBAL LIGATION  1976   VARICOSE VEIN SURGERY Bilateral 1972   VENTRAL HERNIA REPAIR   06/23/13   Dr. Franz Dell   Patient Active Problem List   Diagnosis Date Noted   Globus sensation 03/29/2021   Dry mouth 03/29/2020   Seborrheic dermatitis of scalp 01/22/2020   Cervical radiculopathy 08/25/2019   History of esophagogastroduodenoscopy (EGD) 02/08/2018   Colon cancer screening 02/08/2018   Essential hypertension 12/25/2017   GERD (gastroesophageal reflux disease) 08/30/2017   Chronic diastolic (congestive) heart failure ([place]) 08/30/2017   History of atrial fibrillation 08/30/2017   Flushing 07/30/2017   Paresthesia of both hands 07/30/2017   Postsurgical hypothyroidism 07/30/2017   Aortic atherosclerosis ([place]) 04/28/2013   S/P cataract extraction 01/16/2013   Pseudophakia of both eyes 12/20/2012   Schatzki's ring 10/16/2012   Spinal stenosis of lumbar region 08/16/2012   Vaginal atrophy 11/14/2011   Graves' disease 05/05/2011   Disc herniation    Osteopenia    PERSONAL HISTORY OF ALLERGY TO LATEX 12/23/2009   UNSPECIFIED DISEASE OF PHARYNX 12/16/2008   Intermittent palpitations 11/10/2008   Hypothyroidism 09/22/2008   Hyperlipidemia 09/22/2008   ALLERGIC RHINITIS 09/22/2008    PCP: Durward Parcel  REFERRING PROVIDER: Beatrice Lecher  REFERRING DIAG: M25.551 (ICD-10-CM) - Right hip pain M54.50 (ICD-10-CM) - Acute low back pain, unspecified  back pain laterality, unspecified whether sciatica present   THERAPY DIAG:  Radiculopathy, lumbosacral region  Other abnormalities of gait and mobility  Difficulty in walking, not elsewhere classified  Muscle weakness (generalized)  Abnormal posture  Acute bilateral low back pain with right-sided sciatica  Rationale for Evaluation and Treatment Rehabilitation  ONSET DATE: ~2 weeks ago  SUBJECTIVE:   SUBJECTIVE STATEMENT: 05/11/22: Patient reports that the pain in the back is about the same and she is having less pain into the Rt groin area. She has done some of her exercises and walked yesterday after  exercises. She continues to have some pain into the Rt LE and both feet are "numby" feeling.  Would like to try traction. This has helped in the past.   EVAL: Pt reports waking up one morning and having a lot of pain and difficulty standing on her R LE. Pt states once she got up for a little bit she could walk. Every time she would get up/down she would feel the same thing. Now it has gotten to the point that both of her legs -- waist down -- get so tired. Pt reports increased numbness/tingling down R LE all the way to the bottom of her foot.   PERTINENT HISTORY: Prior back pain  PAIN:  Are you having pain? Yes: NPRS scale: 3/10 Pain location: Pain starts in R buttock and side of hip less into groin and less in top of thigh Pain description: Aching, numbness/tingling Aggravating factors: Walking, prolonged standing > 5 min Relieving factors: Prednisone  PRECAUTIONS: None  WEIGHT BEARING RESTRICTIONS: No  FALLS:  Has patient fallen in last 6 months? No  LIVING ENVIRONMENT: Lives with: lives with their spouse Lives in: House/apartment Stairs: No Has following equipment at home: None  OCCUPATION: Retired  PLOF: Independent  PATIENT GOALS: Improve pain for mobility   OBJECTIVE:   DIAGNOSTIC FINDINGS: X-rays: Mild right hip osteoarthritis. IMPRESSION: 1. Multilevel degenerative disc disease and facet hypertrophy, prominent at L2-L3 and L4-L5. 2. Moderate levo scoliosis. 3. Approximately 7 mm anterolisthesis of L4 on L5.  PATIENT SURVEYS:  FOTO 60; predicted 50  COGNITION: Overall cognitive status: Within functional limits for tasks assessed     SENSATION: Reports R LE N/T  EDEMA: NONE  MUSCLE LENGTH: Hamstrings: Right 60 deg; Left 60 deg Thomas test: Right 0 deg (increased symptoms into R groin and R thigh); Left 0 deg  POSTURE:  R ASIS slightly higher in standing, with increased anterior rotation  PALPATION: TTP R>L Iliospoas, hip flexor. PA mobs on R PSIS  resulted in symptoms on L PSIS  LUMBAR ROM:   Active  A/PROM  eval  Flexion 80% can feel increased symptoms on R  Extension 50% can feel in midback  Right lateral flexion To knee can feel increased symptoms on R  Left lateral flexion 50% feels   Right rotation 100%  Left rotation 100%   (Blank rows = not tested)     LOWER EXTREMITY MMT:  MMT Right eval Left eval  Hip flexion 4- 4+  Hip extension 3- 3  Hip abduction 4+ 4+  Hip adduction    Hip internal rotation    Hip external rotation    Knee flexion 5 5  Knee extension 4+ 4+  Ankle dorsiflexion    Ankle plantarflexion    Ankle inversion    Ankle eversion     (Blank rows = not tested)  LOWER EXTREMITY SPECIAL TESTS:  Hip special tests: Saralyn Pilar (FABER) test: positive  and Thomas test: positive  Supine to long sit R LE appears long to short indicating anterior rotation  FUNCTIONAL TESTS:  Did not assess  GAIT: Distance walked: 150' Assistive device utilized: None Level of assistance: Complete Independence Comments: Mildly antalgic on R   TODAY'S TREATMENT                                                                          DATE: 05/11/22  Therapeutic exercise: Nustep L5 x 5 min UE 9; seat 6 Row blue TB x 10 x 2 sets  Antirotation blue TB x 10 each side  Transverse abdominal isometric(4 part core 10 sec x 10  Bridge 3 sec hold x 5  Shoulder extension supine core engaged 3 sec x 10  Hip flexor stretch sitting 30 sec x 3 each side  Standing facing wall glut set 5 sec x 10   Manual: STM bilat lumbar and Rt posterior hip/buttock  Modalities: Moist heat x 10 min   05/09/22:  Therapeutic exercise: Nustep L5 x 5 min UE 9; seat 6 Row blue TB x 10 x 2 sets  Antirotation blue TB x 10 each side  Transverse abdominal isometric(4 part core 10 sec x 10  Hip flexor stretch sitting 30 sec x 3 each side  Standing facing wall glut set 5 sec x 10   Modalities: Moist heat x 10 min   04/27/22:  Manual  work:  Deep tissue work to pt tolerance through the psoas musculature pt supine hooklying   Therapeutic exercise: Transverse abdominal isometric(4 part core 10 sec x 10  Walking in gym working on activation of core with gait Hip flexor stretch sitting 20-30 sec x 2 each side  Standing facing wall glut set 10 sec x 5   Modalities: Lumbar traction - intermittent Max 60# Min 45# Hold 30 sec  Release 10 sec  Time X 10 min  Moist heat with traction   PATIENT EDUCATION:  Education details: Exam findings, POC Person educated: Patient Education method: Customer service manager Education comprehension: verbalized understanding, returned demonstration, and needs further education  HOME EXERCISE PROGRAM: Access Code: [email or web address] URL: [email or web address] Date: 05/09/2022 Prepared by: Gillermo Murdoch  Exercises - Supine Transversus Abdominis Bracing with Pelvic Floor Contraction  - 2 x daily - 7 x weekly - 1 sets - 10 reps - 10sec  hold - Standing Lumbar Extension  - 2 x daily - 7 x weekly - 1 sets - 2-3 reps - 2-3 sec  hold - Seated Hip Flexor Stretch  - 2 x daily - 7 x weekly - 1 sets - 3 reps - 30 sec  hold - Standing Gluteal Sets  - 2 x daily - 7 x weekly - 1 sets - 10 reps - 10 sec  hold - Standing Bilateral Low Shoulder Row with Anchored Resistance  - 2 x daily - 7 x weekly - 1-3 sets - 10 reps - 2-3 sec  hold - Anti-Rotation Lateral Stepping with Press  - 2 x daily - 7 x weekly - 1-2 sets - 10 reps - 2-3 sec  hold  ASSESSMENT:  CLINICAL IMPRESSION: 05/11/22: Patient reports continued pain in the Rt LB, posterior and lateral hip. She has  decreased radicular symptoms with less pain in the groin. Significant tightness and tenderness to palpation through the Rt posterior hip - piriformis, glut med, glut min and Rt > Lt iliopsoas musculature. Rt shoulder is sore today which has happened a few times during the year. Gradually progressing toward stated goals of therapy.  Consider second trial of lumbar traction as indicated.   EVAL: Patient is a 71 y.o. F who was seen today for physical therapy evaluation and treatment for low back/hip pain with R LE radicular symptoms. Assessment significant for hip weakness, pain, R>L hip tightness and tenderness to palpation likely leading to increased lumbar extension as well as s/s of R anterior inominate rotation resulting in increased femoral nerve impingement. Pt would benefit from PT to address these issues for full return to function.   OBJECTIVE IMPAIRMENTS: Abnormal gait, decreased activity tolerance, decreased balance, decreased mobility, difficulty walking, decreased ROM, decreased strength, increased fascial restrictions, increased muscle spasms, impaired sensation, postural dysfunction, and pain.   ACTIVITY LIMITATIONS: squatting, transfers, bed mobility, and locomotion level  PARTICIPATION LIMITATIONS: cleaning, shopping, and community activity  PERSONAL FACTORS: Age, Fitness, Past/current experiences, and Time since onset of injury/illness/exacerbation are also affecting patient's functional outcome.   REHAB POTENTIAL: Good  CLINICAL DECISION MAKING: Evolving/moderate complexity  EVALUATION COMPLEXITY: Moderate   GOALS: Goals reviewed with patient? Yes  SHORT TERM GOALS: Target date: 05/17/2022  Pt will be ind with initial HEP Baseline: Goal status: INITIAL  2.  Pt will report no symptoms with Marcello Moores testing Baseline:  Goal status: INITIAL   LONG TERM GOALS: Target date: 06/14/2022   Pt will be ind with initial HEP Baseline:  Goal status: INITIAL  2.  Pt will report >/=50% improved pain with amb Baseline:  Goal status: INITIAL  3.  Pt will report no more N/T in R LE Baseline:  Goal status: INITIAL  4.  Pt will improve FOTO to >/=70 Baseline:  Goal status: INITIAL  5.  Pt will demo at least 4/5 bilat LE strength for improved transfers and mobility Baseline:  Goal status:  INITIAL     PLAN:  PT FREQUENCY: 2x/week  PT DURATION: 8 weeks  PLANNED INTERVENTIONS: Therapeutic exercises, Therapeutic activity, Neuromuscular re-education, Balance training, Gait training, Patient/Family education, Self Care, Joint mobilization, Aquatic Therapy, Dry Needling, Electrical stimulation, Spinal mobilization, Cryotherapy, Moist heat, Taping, Traction, Ultrasound, Ionotophoresis '4mg'$ /ml Dexamethasone, Manual therapy, and Re-evaluation  PLAN FOR NEXT SESSION: Progress HEP, check pelvic alignment and address as indicated. Stretch hip flexors. Manual work through iliopsoas. Hip/core strengthening.    Everardo All, PT, MPH 05/11/2022, 10:23 AM

## 2022-05-16 ENCOUNTER — Ambulatory Visit: Payer: Medicare Other | Admitting: Rehabilitative and Restorative Service Providers"

## 2022-05-16 ENCOUNTER — Encounter: Payer: Self-pay | Admitting: Rehabilitative and Restorative Service Providers"

## 2022-05-16 DIAGNOSIS — M5417 Radiculopathy, lumbosacral region: Secondary | ICD-10-CM | POA: Diagnosis not present

## 2022-05-16 DIAGNOSIS — R2689 Other abnormalities of gait and mobility: Secondary | ICD-10-CM | POA: Diagnosis not present

## 2022-05-16 DIAGNOSIS — R262 Difficulty in walking, not elsewhere classified: Secondary | ICD-10-CM

## 2022-05-16 DIAGNOSIS — M6281 Muscle weakness (generalized): Secondary | ICD-10-CM | POA: Diagnosis not present

## 2022-05-16 DIAGNOSIS — M5441 Lumbago with sciatica, right side: Secondary | ICD-10-CM | POA: Diagnosis not present

## 2022-05-16 DIAGNOSIS — R293 Abnormal posture: Secondary | ICD-10-CM | POA: Diagnosis not present

## 2022-05-16 NOTE — Therapy (Signed)
OUTPATIENT PHYSICAL THERAPY LOWER EXTREMITY TREATMENT   Patient Name: Anne [name] MRN: [id number] DOB:1951-05-07, 71 y.o., female Today's Date: 05/16/2022   PT End of Session - 05/16/22 1147     Visit Number 6    Number of Visits 12    Date for PT Re-Evaluation 05/31/22    Authorization Type Medicare    Progress Note Due on Visit 10    PT Start Time 1145    PT Stop Time 1234    PT Time Calculation (min) 49 min    Activity Tolerance Patient tolerated treatment well             Past Medical History:  Diagnosis Date   Acid reflux    Aortic atherosclerosis ([place]) 04/28/2013   CT 08/2017   Arthritis    "joints, hands" (08/30/2017)   Chronic lower back pain    Disc herniation    neck and low back   Family history of adverse reaction to anesthesia    "daughter w/PONV"   Follicular cystitis    Hiatal hernia    High cholesterol    Hyperlipidemia    ELEV CHOLESTEROL   Hypothyroidism    Osteopenia 2014   T score -1.6 FRAX 13%/1%   PAF (paroxysmal atrial fibrillation) ([place]) 08/30/2017   Echo 2/19: EF 55-60, normal wall motion, grade 1 diastolic dysfunction, mild PI, trivial TR //  Nuc 3/19: no ischemia or scar, EF 80 // CHADS2-VASc: 3 // Eliquis 5 bid    Schatzki's ring    Past Surgical History:  Procedure Laterality Date   BREAST BIOPSY Bilateral 1997   "excisional bx both benign"   BREAST CYST ASPIRATION Right ~ [place] Left 2011   CATARACT EXTRACTION W/ INTRAOCULAR LENS IMPLANT Right 12/20/2012   Dr. Haynes Bast, OD   COLONOSCOPY WITH ESOPHAGOGASTRODUODENOSCOPY (EGD)  "several"   "? dilated esophagus" (08/30/2017)   DILATION AND CURETTAGE OF UTERUS  1996   EYE MUSCLE SURGERY Bilateral 1992   HERNIA REPAIR     HYSTEROSCOPY     LAPAROSCOPIC CHOLECYSTECTOMY  09/28/2011   OOPHORECTOMY Right 04/2000   LAP RSO/LYSIS OF ADHESIONS   THYROIDECTOMY  1976   TUBAL LIGATION  1976   VARICOSE VEIN SURGERY Bilateral 1972    VENTRAL HERNIA REPAIR  06/23/13   Dr. Franz Dell   Patient Active Problem List   Diagnosis Date Noted   Globus sensation 03/29/2021   Dry mouth 03/29/2020   Seborrheic dermatitis of scalp 01/22/2020   Cervical radiculopathy 08/25/2019   History of esophagogastroduodenoscopy (EGD) 02/08/2018   Colon cancer screening 02/08/2018   Essential hypertension 12/25/2017   GERD (gastroesophageal reflux disease) 08/30/2017   Chronic diastolic (congestive) heart failure ([place]) 08/30/2017   History of atrial fibrillation 08/30/2017   Flushing 07/30/2017   Paresthesia of both hands 07/30/2017   Postsurgical hypothyroidism 07/30/2017   Aortic atherosclerosis ([place]) 04/28/2013   S/P cataract extraction 01/16/2013   Pseudophakia of both eyes 12/20/2012   Schatzki's ring 10/16/2012   Spinal stenosis of lumbar region 08/16/2012   Vaginal atrophy 11/14/2011   Graves' disease 05/05/2011   Disc herniation    Osteopenia    PERSONAL HISTORY OF ALLERGY TO LATEX 12/23/2009   UNSPECIFIED DISEASE OF PHARYNX 12/16/2008   Intermittent palpitations 11/10/2008   Hypothyroidism 09/22/2008   Hyperlipidemia 09/22/2008   ALLERGIC RHINITIS 09/22/2008    PCP: Durward Parcel  REFERRING PROVIDER: Beatrice Lecher  REFERRING DIAG: M25.551 (ICD-10-CM) - Right hip pain M54.50 (ICD-10-CM) -  Acute low back pain, unspecified back pain laterality, unspecified whether sciatica present   THERAPY DIAG:  Radiculopathy, lumbosacral region  Other abnormalities of gait and mobility  Difficulty in walking, not elsewhere classified  Muscle weakness (generalized)  Abnormal posture  Acute bilateral low back pain with right-sided sciatica  Rationale for Evaluation and Treatment Rehabilitation  ONSET DATE: ~2 weeks ago  SUBJECTIVE:   SUBJECTIVE STATEMENT: 05/16/22: Patient reports that the pain in the back is worse today. Started hurting more in the past couple of days. She thinks that it is  the change in barometric  pressure and rainy day.She most of the pain in the Rt low back and into the anterior hip and thigh. She has done some of her exercises and walked yesterday. She still has symptoms into both feet that feel "numby" feeling. Does not want to try traction today.   EVAL: Pt reports waking up one morning and having a lot of pain and difficulty standing on her R LE. Pt states once she got up for a little bit she could walk. Every time she would get up/down she would feel the same thing. Now it has gotten to the point that both of her legs -- waist down -- get so tired. Pt reports increased numbness/tingling down R LE all the way to the bottom of her foot.   PERTINENT HISTORY: Prior back pain  PAIN:  Are you having pain? Yes: NPRS scale: 6/10 Pain location: Pain starts in R buttock and side of hip less into groin and less in top of thigh Pain description: Aching, numbness/tingling Aggravating factors: Walking, prolonged standing > 5 min Relieving factors: Prednisone  PRECAUTIONS: None  WEIGHT BEARING RESTRICTIONS: No  FALLS:  Has patient fallen in last 6 months? No  LIVING ENVIRONMENT: Lives with: lives with their spouse Lives in: House/apartment Stairs: No Has following equipment at home: None  OCCUPATION: Retired  PLOF: Independent  PATIENT GOALS: Improve pain for mobility   OBJECTIVE:   DIAGNOSTIC FINDINGS: X-rays: Mild right hip osteoarthritis. IMPRESSION: 1. Multilevel degenerative disc disease and facet hypertrophy, prominent at L2-L3 and L4-L5. 2. Moderate levo scoliosis. 3. Approximately 7 mm anterolisthesis of L4 on L5.  PATIENT SURVEYS:  FOTO 60; predicted 48  COGNITION: Overall cognitive status: Within functional limits for tasks assessed     SENSATION: Reports R LE N/T  EDEMA: NONE  MUSCLE LENGTH: Hamstrings: Right 60 deg; Left 60 deg Thomas test: Right 0 deg (increased symptoms into R groin and R thigh); Left 0 deg  POSTURE:  R ASIS slightly higher in  standing, with increased anterior rotation  PALPATION: TTP R>L Iliospoas, hip flexor. PA mobs on R PSIS resulted in symptoms on L PSIS  LUMBAR ROM:   Active  A/PROM  eval  Flexion 80% can feel increased symptoms on R  Extension 50% can feel in midback  Right lateral flexion To knee can feel increased symptoms on R  Left lateral flexion 50% feels   Right rotation 100%  Left rotation 100%   (Blank rows = not tested)     LOWER EXTREMITY MMT:  MMT Right eval Left eval  Hip flexion 4- 4+  Hip extension 3- 3  Hip abduction 4+ 4+  Hip adduction    Hip internal rotation    Hip external rotation    Knee flexion 5 5  Knee extension 4+ 4+  Ankle dorsiflexion    Ankle plantarflexion    Ankle inversion    Ankle eversion     (  Blank rows = not tested)  LOWER EXTREMITY SPECIAL TESTS:  Hip special tests: Saralyn Pilar (FABER) test: positive  and Thomas test: positive  Supine to long sit R LE appears long to short indicating anterior rotation  FUNCTIONAL TESTS:  Did not assess  GAIT: Distance walked: 150' Assistive device utilized: None Level of assistance: Complete Independence Comments: Mildly antalgic on R   TODAY'S TREATMENT                                                                          DATE: 05/16/22  Therapeutic exercise: Transverse abdominal isometric 3 part core 10 sec x 10  Shoulder extension supine core engaged 3 sec x 10  Hip abduction supine alternating green TB x 10 x 2 sets Row blue TB x 10 x 2 sets  Antirotation blue TB x 10 each side  Bridge 3 sec hold x 1 (painful - hold)  Manual: STM bilat lumbar and Rt posterior hip/buttock to Rt posterior shoulder (lats)   Modalities: Moist heat x 10 min   05/11/22: Therapeutic exercise: Nustep L5 x 5 min UE 9; seat 6 Row blue TB x 10 x 2 sets  Antirotation blue TB x 10 each side  Transverse abdominal isometric(4 part core 10 sec x 10  Hip flexor stretch sitting 30 sec x 3 each side  Standing facing  wall glut set 5 sec x 10   Modalities: Moist heat x 10 min  04/27/22:Modalities: included trial of TX  Lumbar traction - intermittent Max 60# Min 45# Hold 30 sec  Release 10 sec  Time X 10 min  Moist heat with traction   PATIENT EDUCATION:  Education details: Exam findings, POC Person educated: Patient Education method: Explanation and Demonstration Education comprehension: verbalized understanding, returned demonstration, and needs further education  HOME EXERCISE PROGRAM: Access Code: [email or web address] URL: [email or web address] Date: 05/09/2022 Prepared by: Gillermo Murdoch  Exercises - Supine Transversus Abdominis Bracing with Pelvic Floor Contraction  - 2 x daily - 7 x weekly - 1 sets - 10 reps - 10sec  hold - Standing Lumbar Extension  - 2 x daily - 7 x weekly - 1 sets - 2-3 reps - 2-3 sec  hold - Seated Hip Flexor Stretch  - 2 x daily - 7 x weekly - 1 sets - 3 reps - 30 sec  hold - Standing Gluteal Sets  - 2 x daily - 7 x weekly - 1 sets - 10 reps - 10 sec  hold - Standing Bilateral Low Shoulder Row with Anchored Resistance  - 2 x daily - 7 x weekly - 1-3 sets - 10 reps - 2-3 sec  hold - Anti-Rotation Lateral Stepping with Press  - 2 x daily - 7 x weekly - 1-2 sets - 10 reps - 2-3 sec  hold  ASSESSMENT:  CLINICAL IMPRESSION: 05/16/22: Patient reports increased pain in the Rt LB, posterior and lateral hip into the anterior thigh today and in the past couple of days. Note continued significant tightness and tenderness to palpation through the Rt posterior hip - piriformis, glut med, glut min and Rt > Lt iliopsoas musculature.Flare up of symptoms - did not increase exercises. Gradually progressing toward stated goals of therapy. Consider second  trial of lumbar traction as patient requests since traction has helped in the past.    EVAL: Patient is a 71 y.o. F who was seen today for physical therapy evaluation and treatment for low back/hip pain with R LE radicular symptoms.  Assessment significant for hip weakness, pain, R>L hip tightness and tenderness to palpation likely leading to increased lumbar extension as well as s/s of R anterior inominate rotation resulting in increased femoral nerve impingement. Pt would benefit from PT to address these issues for full return to function.   OBJECTIVE IMPAIRMENTS: Abnormal gait, decreased activity tolerance, decreased balance, decreased mobility, difficulty walking, decreased ROM, decreased strength, increased fascial restrictions, increased muscle spasms, impaired sensation, postural dysfunction, and pain.   ACTIVITY LIMITATIONS: squatting, transfers, bed mobility, and locomotion level  PARTICIPATION LIMITATIONS: cleaning, shopping, and community activity  PERSONAL FACTORS: Age, Fitness, Past/current experiences, and Time since onset of injury/illness/exacerbation are also affecting patient's functional outcome.   REHAB POTENTIAL: Good  CLINICAL DECISION MAKING: Evolving/moderate complexity  EVALUATION COMPLEXITY: Moderate   GOALS: Goals reviewed with patient? Yes  SHORT TERM GOALS: Target date: 05/17/2022  Pt will be ind with initial HEP Baseline: Goal status: INITIAL  2.  Pt will report no symptoms with Marcello Moores testing Baseline:  Goal status: INITIAL   LONG TERM GOALS: Target date: 06/14/2022   Pt will be ind with initial HEP Baseline:  Goal status: INITIAL  2.  Pt will report >/=50% improved pain with amb Baseline:  Goal status: INITIAL  3.  Pt will report no more N/T in R LE Baseline:  Goal status: INITIAL  4.  Pt will improve FOTO to >/=70 Baseline:  Goal status: INITIAL  5.  Pt will demo at least 4/5 bilat LE strength for improved transfers and mobility Baseline:  Goal status: INITIAL     PLAN:  PT FREQUENCY: 2x/week  PT DURATION: 8 weeks  PLANNED INTERVENTIONS: Therapeutic exercises, Therapeutic activity, Neuromuscular re-education, Balance training, Gait training,  Patient/Family education, Self Care, Joint mobilization, Aquatic Therapy, Dry Needling, Electrical stimulation, Spinal mobilization, Cryotherapy, Moist heat, Taping, Traction, Ultrasound, Ionotophoresis '4mg'$ /ml Dexamethasone, Manual therapy, and Re-evaluation  PLAN FOR NEXT SESSION: Progress HEP, check pelvic alignment and address as indicated. Stretch hip flexors. Manual work through iliopsoas. Hip/core strengthening.    Everardo All, PT, MPH 05/16/2022, 12:13 PM

## 2022-05-24 ENCOUNTER — Ambulatory Visit: Payer: Medicare Other | Admitting: Rehabilitative and Restorative Service Providers"

## 2022-05-24 ENCOUNTER — Encounter: Payer: Self-pay | Admitting: Rehabilitative and Restorative Service Providers"

## 2022-05-24 DIAGNOSIS — R262 Difficulty in walking, not elsewhere classified: Secondary | ICD-10-CM

## 2022-05-24 DIAGNOSIS — M6281 Muscle weakness (generalized): Secondary | ICD-10-CM

## 2022-05-24 DIAGNOSIS — R293 Abnormal posture: Secondary | ICD-10-CM | POA: Diagnosis not present

## 2022-05-24 DIAGNOSIS — R2689 Other abnormalities of gait and mobility: Secondary | ICD-10-CM | POA: Diagnosis not present

## 2022-05-24 DIAGNOSIS — M5441 Lumbago with sciatica, right side: Secondary | ICD-10-CM | POA: Diagnosis not present

## 2022-05-24 DIAGNOSIS — M5417 Radiculopathy, lumbosacral region: Secondary | ICD-10-CM

## 2022-05-24 NOTE — Therapy (Signed)
OUTPATIENT PHYSICAL THERAPY LOWER EXTREMITY TREATMENT   Patient Name: Anne [name] MRN: [id number] DOB:1950-12-07, 71 y.o., female Today's Date: 05/24/2022   PT End of Session - 05/24/22 1022     Visit Number 7    Number of Visits 12    Date for PT Re-Evaluation 05/31/22    Authorization Type Medicare    Progress Note Due on Visit 10    PT Start Time 1015    PT Stop Time 1103    PT Time Calculation (min) 48 min    Activity Tolerance Patient tolerated treatment well             Past Medical History:  Diagnosis Date   Acid reflux    Aortic atherosclerosis ([place]) 04/28/2013   CT 08/2017   Arthritis    "joints, hands" (08/30/2017)   Chronic lower back pain    Disc herniation    neck and low back   Family history of adverse reaction to anesthesia    "daughter w/PONV"   Follicular cystitis    Hiatal hernia    High cholesterol    Hyperlipidemia    ELEV CHOLESTEROL   Hypothyroidism    Osteopenia 2014   T score -1.6 FRAX 13%/1%   PAF (paroxysmal atrial fibrillation) ([place]) 08/30/2017   Echo 2/19: EF 55-60, normal wall motion, grade 1 diastolic dysfunction, mild PI, trivial TR //  Nuc 3/19: no ischemia or scar, EF 80 // CHADS2-VASc: 3 // Eliquis 5 bid    Schatzki's ring    Past Surgical History:  Procedure Laterality Date   BREAST BIOPSY Bilateral 1997   "excisional bx both benign"   BREAST CYST ASPIRATION Right ~ [place] Left 2011   CATARACT EXTRACTION W/ INTRAOCULAR LENS IMPLANT Right 12/20/2012   Dr. Haynes Bast, OD   COLONOSCOPY WITH ESOPHAGOGASTRODUODENOSCOPY (EGD)  "several"   "? dilated esophagus" (08/30/2017)   DILATION AND CURETTAGE OF UTERUS  1996   EYE MUSCLE SURGERY Bilateral 1992   HERNIA REPAIR     HYSTEROSCOPY     LAPAROSCOPIC CHOLECYSTECTOMY  09/28/2011   OOPHORECTOMY Right 04/2000   LAP RSO/LYSIS OF ADHESIONS   THYROIDECTOMY  1976   TUBAL LIGATION  1976   VARICOSE VEIN SURGERY Bilateral 1972    VENTRAL HERNIA REPAIR  06/23/13   Dr. Franz Dell   Patient Active Problem List   Diagnosis Date Noted   Globus sensation 03/29/2021   Dry mouth 03/29/2020   Seborrheic dermatitis of scalp 01/22/2020   Cervical radiculopathy 08/25/2019   History of esophagogastroduodenoscopy (EGD) 02/08/2018   Colon cancer screening 02/08/2018   Essential hypertension 12/25/2017   GERD (gastroesophageal reflux disease) 08/30/2017   Chronic diastolic (congestive) heart failure ([place]) 08/30/2017   History of atrial fibrillation 08/30/2017   Flushing 07/30/2017   Paresthesia of both hands 07/30/2017   Postsurgical hypothyroidism 07/30/2017   Aortic atherosclerosis ([place]) 04/28/2013   S/P cataract extraction 01/16/2013   Pseudophakia of both eyes 12/20/2012   Schatzki's ring 10/16/2012   Spinal stenosis of lumbar region 08/16/2012   Vaginal atrophy 11/14/2011   Graves' disease 05/05/2011   Disc herniation    Osteopenia    PERSONAL HISTORY OF ALLERGY TO LATEX 12/23/2009   UNSPECIFIED DISEASE OF PHARYNX 12/16/2008   Intermittent palpitations 11/10/2008   Hypothyroidism 09/22/2008   Hyperlipidemia 09/22/2008   ALLERGIC RHINITIS 09/22/2008    PCP: Durward Parcel  REFERRING PROVIDER: Beatrice Lecher  REFERRING DIAG: M25.551 (ICD-10-CM) - Right hip pain M54.50 (ICD-10-CM) -  Acute low back pain, unspecified back pain laterality, unspecified whether sciatica present   THERAPY DIAG:  Radiculopathy, lumbosacral region  Other abnormalities of gait and mobility  Difficulty in walking, not elsewhere classified  Muscle weakness (generalized)  Abnormal posture  Acute bilateral low back pain with right-sided sciatica  Rationale for Evaluation and Treatment Rehabilitation  ONSET DATE: ~2 weeks ago  SUBJECTIVE:   SUBJECTIVE STATEMENT: 05/24/22: Patient reports that the pain in the back and Rt LE are improving. She most of the pain in the Rt low back and into the anterior hip and thigh. She has done  some of her exercises and walked yesterday. She still has symptoms into Rt foot which feels a little different in the arch of foot.   EVAL: Pt reports waking up one morning and having a lot of pain and difficulty standing on her R LE. Pt states once she got up for a little bit she could walk. Every time she would get up/down she would feel the same thing. Now it has gotten to the point that both of her legs -- waist down -- get so tired. Pt reports increased numbness/tingling down R LE all the way to the bottom of her foot.   PERTINENT HISTORY: Prior back pain  PAIN:  Are you having pain? Yes: NPRS scale: 2/10 Pain location: Soreness in the back; less pain in the Rt LE Pain description: Aching, numbness/tingling Aggravating factors: Walking, prolonged standing > 5 min Relieving factors: tens; exercise  PRECAUTIONS: None  WEIGHT BEARING RESTRICTIONS: No  FALLS:  Has patient fallen in last 6 months? No  LIVING ENVIRONMENT: Lives with: lives with their spouse Lives in: House/apartment Stairs: No Has following equipment at home: None  OCCUPATION: Retired  PLOF: Independent  PATIENT GOALS: Improve pain for mobility   OBJECTIVE:   DIAGNOSTIC FINDINGS: X-rays: Mild right hip osteoarthritis. IMPRESSION: 1. Multilevel degenerative disc disease and facet hypertrophy, prominent at L2-L3 and L4-L5. 2. Moderate levo scoliosis. 3. Approximately 7 mm anterolisthesis of L4 on L5.  PATIENT SURVEYS:  FOTO 60; predicted 70  SENSATION: Reports R LE N/T  MUSCLE LENGTH: Hamstrings: Right 60 deg; Left 60 deg Thomas test: Right 0 deg (increased symptoms into R groin and R thigh); Left 0 deg  POSTURE:  R ASIS slightly higher in standing, with increased anterior rotation  PALPATION: TTP R>L Iliospoas, hip flexor. PA mobs on R PSIS resulted in symptoms on L PSIS  LUMBAR ROM:   Active  A/PROM  eval  Flexion 80% can feel increased symptoms on R  Extension 50% can feel in midback   Right lateral flexion To knee can feel increased symptoms on R  Left lateral flexion 50% feels   Right rotation 100%  Left rotation 100%   (Blank rows = not tested)     LOWER EXTREMITY MMT:  MMT Right eval Left eval  Hip flexion 4- 4+  Hip extension 3- 3  Hip abduction 4+ 4+  Hip adduction    Hip internal rotation    Hip external rotation    Knee flexion 5 5  Knee extension 4+ 4+  Ankle dorsiflexion    Ankle plantarflexion    Ankle inversion    Ankle eversion     (Blank rows = not tested)  LOWER EXTREMITY SPECIAL TESTS:  Hip special tests: Saralyn Pilar (FABER) test: positive  and Thomas test: positive  Supine to long sit R LE appears long to short indicating anterior rotation  FUNCTIONAL TESTS:  Did not assess  GAIT: Distance walked: 150' Assistive device utilized: None Level of assistance: Complete Independence Comments: Mildly antalgic on R   TODAY'S TREATMENT                                                                           Date: 05/24/22: Therapeutic exercise: Transverse abdominal isometric 3 part core 10 sec x 10  Shoulder extension supine core engaged 3 sec x 10  Hip abduction supine alternating green TB x 10 x 2 sets Bridge 3 sec hold x 10  Happy baby supine rocking ~ 1 min  Sitting working on core tightening transverse abdominals and lifting chest 10 sec hold x 5 Row blue TB x 10 x 2 sets  Antirotation green TB x 10 x 2 sets each side   Trial sitting on small yellow ball then folded hand towel to release pelvic floor tightness   Modalities: Moist heat x 10 min   DATE: 05/16/22  Therapeutic exercise: Transverse abdominal isometric 3 part core 10 sec x 10  Shoulder extension supine core engaged 3 sec x 10  Hip abduction supine alternating green TB x 10 x 2 sets Bridge 3 sec hold x 1 (painful - hold) Row blue TB x 10 x 2 sets  Antirotation blue TB x 10 each side   Modalities: Moist heat x 10 min  04/27/22:Modalities: included trial of  TX  Lumbar traction - intermittent Max 60# Min 45# Hold 30 sec  Release 10 sec  Time X 10 min  Moist heat with traction   PATIENT EDUCATION:  Education details: Exam findings, POC Person educated: Patient Education method: Explanation and Demonstration Education comprehension: verbalized understanding, returned demonstration, and needs further education  HOME EXERCISE PROGRAM: Access Code: [email or web address] URL: [email or web address] Date: 05/24/2022 Prepared by: Gillermo Murdoch  Exercises - Supine Transversus Abdominis Bracing with Pelvic Floor Contraction  - 2 x daily - 7 x weekly - 1 sets - 10 reps - 10sec  hold - Standing Lumbar Extension  - 2 x daily - 7 x weekly - 1 sets - 2-3 reps - 2-3 sec  hold - Seated Hip Flexor Stretch  - 2 x daily - 7 x weekly - 1 sets - 3 reps - 30 sec  hold - Standing Gluteal Sets  - 2 x daily - 7 x weekly - 1 sets - 10 reps - 10 sec  hold - Standing Bilateral Low Shoulder Row with Anchored Resistance  - 2 x daily - 7 x weekly - 1-3 sets - 10 reps - 2-3 sec  hold - Anti-Rotation Lateral Stepping with Press  - 2 x daily - 7 x weekly - 1-2 sets - 10 reps - 2-3 sec  hold - Supine Pelvic Floor Stretch  - 2 x daily - 7 x weekly - Seated Pelvic Floor Contraction  - 1-2 x daily - 7 x weekly - 1 sets - 5-10 reps - 10 sec  hold  ASSESSMENT:  CLINICAL IMPRESSION: 05/24/22: Patient reports improved pain in the Rt LB, posterior and lateral hip into the anterior thigh. Note tightness in pelvic floor musculature addressed with trial of release sitting on towel and added happy baby. Gradually progressing toward stated goals of therapy.  EVAL: Patient is a 71 y.o. F who was seen today for physical therapy evaluation and treatment for low back/hip pain with R LE radicular symptoms. Assessment significant for hip weakness, pain, R>L hip tightness and tenderness to palpation likely leading to increased lumbar extension as well as s/s of R anterior inominate rotation  resulting in increased femoral nerve impingement. Pt would benefit from PT to address these issues for full return to function.    GOALS: Goals reviewed with patient? Yes  SHORT TERM GOALS: Target date: 05/17/2022  Pt will be ind with initial HEP Baseline: Goal status: INITIAL  2.  Pt will report no symptoms with Marcello Moores testing Baseline:  Goal status: INITIAL   LONG TERM GOALS: Target date: 06/14/2022   Pt will be ind with initial HEP Baseline:  Goal status: INITIAL  2.  Pt will report >/=50% improved pain with amb Baseline:  Goal status: INITIAL  3.  Pt will report no more N/T in R LE Baseline:  Goal status: INITIAL  4.  Pt will improve FOTO to >/=70 Baseline:  Goal status: INITIAL  5.  Pt will demo at least 4/5 bilat LE strength for improved transfers and mobility Baseline:  Goal status: INITIAL  PLAN:  PT FREQUENCY: 2x/week  PT DURATION: 8 weeks   PLAN FOR NEXT SESSION: Progress HEP, check pelvic alignment and address as indicated. Stretch hip flexors. Manual work through iliopsoas. Hip/core strengthening.    Everardo All, PT, MPH 05/24/2022, 10:23 AM

## 2022-05-29 ENCOUNTER — Encounter: Payer: Self-pay | Admitting: Rehabilitative and Restorative Service Providers"

## 2022-05-29 ENCOUNTER — Ambulatory Visit: Payer: Medicare Other | Attending: Family Medicine | Admitting: Rehabilitative and Restorative Service Providers"

## 2022-05-29 DIAGNOSIS — R293 Abnormal posture: Secondary | ICD-10-CM | POA: Insufficient documentation

## 2022-05-29 DIAGNOSIS — M5441 Lumbago with sciatica, right side: Secondary | ICD-10-CM | POA: Insufficient documentation

## 2022-05-29 DIAGNOSIS — M5417 Radiculopathy, lumbosacral region: Secondary | ICD-10-CM | POA: Insufficient documentation

## 2022-05-29 DIAGNOSIS — M6281 Muscle weakness (generalized): Secondary | ICD-10-CM | POA: Diagnosis not present

## 2022-05-29 DIAGNOSIS — R2689 Other abnormalities of gait and mobility: Secondary | ICD-10-CM | POA: Insufficient documentation

## 2022-05-29 DIAGNOSIS — R262 Difficulty in walking, not elsewhere classified: Secondary | ICD-10-CM | POA: Diagnosis not present

## 2022-05-29 NOTE — Therapy (Signed)
OUTPATIENT PHYSICAL THERAPY LOWER EXTREMITY TREATMENT   Patient Name: Anne [name] MRN: [id number] DOB:Sep 24, 1950, 71 y.o., female Today's Date: 05/29/2022   PT End of Session - 05/29/22 1101     Visit Number 8    Number of Visits 12    Date for PT Re-Evaluation 05/31/22    Authorization Type Medicare    Progress Note Due on Visit 10    PT Start Time 1100    PT Stop Time 1148    PT Time Calculation (min) 48 min    Activity Tolerance Patient tolerated treatment well             Past Medical History:  Diagnosis Date   Acid reflux    Aortic atherosclerosis ([place]) 04/28/2013   CT 08/2017   Arthritis    "joints, hands" (08/30/2017)   Chronic lower back pain    Disc herniation    neck and low back   Family history of adverse reaction to anesthesia    "daughter w/PONV"   Follicular cystitis    Hiatal hernia    High cholesterol    Hyperlipidemia    ELEV CHOLESTEROL   Hypothyroidism    Osteopenia 2014   T score -1.6 FRAX 13%/1%   PAF (paroxysmal atrial fibrillation) ([place]) 08/30/2017   Echo 2/19: EF 55-60, normal wall motion, grade 1 diastolic dysfunction, mild PI, trivial TR //  Nuc 3/19: no ischemia or scar, EF 80 // CHADS2-VASc: 3 // Eliquis 5 bid    Schatzki's ring    Past Surgical History:  Procedure Laterality Date   BREAST BIOPSY Bilateral 1997   "excisional bx both benign"   BREAST CYST ASPIRATION Right ~ [place] Left 2011   CATARACT EXTRACTION W/ INTRAOCULAR LENS IMPLANT Right 12/20/2012   Dr. Haynes Bast, OD   COLONOSCOPY WITH ESOPHAGOGASTRODUODENOSCOPY (EGD)  "several"   "? dilated esophagus" (08/30/2017)   DILATION AND CURETTAGE OF UTERUS  1996   EYE MUSCLE SURGERY Bilateral 1992   HERNIA REPAIR     HYSTEROSCOPY     LAPAROSCOPIC CHOLECYSTECTOMY  09/28/2011   OOPHORECTOMY Right 04/2000   LAP RSO/LYSIS OF ADHESIONS   THYROIDECTOMY  1976   TUBAL LIGATION  1976   VARICOSE VEIN SURGERY Bilateral 1972    VENTRAL HERNIA REPAIR  06/23/13   Dr. Franz Dell   Patient Active Problem List   Diagnosis Date Noted   Globus sensation 03/29/2021   Dry mouth 03/29/2020   Seborrheic dermatitis of scalp 01/22/2020   Cervical radiculopathy 08/25/2019   History of esophagogastroduodenoscopy (EGD) 02/08/2018   Colon cancer screening 02/08/2018   Essential hypertension 12/25/2017   GERD (gastroesophageal reflux disease) 08/30/2017   Chronic diastolic (congestive) heart failure ([place]) 08/30/2017   History of atrial fibrillation 08/30/2017   Flushing 07/30/2017   Paresthesia of both hands 07/30/2017   Postsurgical hypothyroidism 07/30/2017   Aortic atherosclerosis ([place]) 04/28/2013   S/P cataract extraction 01/16/2013   Pseudophakia of both eyes 12/20/2012   Schatzki's ring 10/16/2012   Spinal stenosis of lumbar region 08/16/2012   Vaginal atrophy 11/14/2011   Graves' disease 05/05/2011   Disc herniation    Osteopenia    PERSONAL HISTORY OF ALLERGY TO LATEX 12/23/2009   UNSPECIFIED DISEASE OF PHARYNX 12/16/2008   Intermittent palpitations 11/10/2008   Hypothyroidism 09/22/2008   Hyperlipidemia 09/22/2008   ALLERGIC RHINITIS 09/22/2008    PCP: Durward Parcel  REFERRING PROVIDER: Beatrice Lecher  REFERRING DIAG: M25.551 (ICD-10-CM) - Right hip pain M54.50 (ICD-10-CM) -  Acute low back pain, unspecified back pain laterality, unspecified whether sciatica present   THERAPY DIAG:  Radiculopathy, lumbosacral region  Other abnormalities of gait and mobility  Difficulty in walking, not elsewhere classified  Muscle weakness (generalized)  Abnormal posture  Acute bilateral low back pain with right-sided sciatica  Rationale for Evaluation and Treatment Rehabilitation  ONSET DATE: ~2 weeks ago  SUBJECTIVE:   SUBJECTIVE STATEMENT: 05/29/22: Patient reports that the pain in the back and Rt LE are improving - no pain in the past two days. Some aching yesterday. Most pain is in the Rt low back and  into the anterior hip and thigh. She still has symptoms into Rt foot which feels a little different in the arch of foot.   EVAL: Pt reports waking up one morning and having a lot of pain and difficulty standing on her R LE. Pt states once she got up for a little bit she could walk. Every time she would get up/down she would feel the same thing. Now it has gotten to the point that both of her legs -- waist down -- get so tired. Pt reports increased numbness/tingling down R LE all the way to the bottom of her foot.   PERTINENT HISTORY: Prior back pain  PAIN:  Are you having pain? Yes: NPRS scale: 2/10 Pain location: Soreness in the back; less pain in the Rt LE Pain description: Aching, numbness/tingling Aggravating factors: Walking, prolonged standing > 5 min Relieving factors: tens; exercise  PRECAUTIONS: None  WEIGHT BEARING RESTRICTIONS: No  FALLS:  Has patient fallen in last 6 months? No   PATIENT GOALS: Improve pain for mobility   OBJECTIVE:   DIAGNOSTIC FINDINGS: X-rays: Mild right hip osteoarthritis. IMPRESSION:1. Multilevel degenerative disc disease and facet hypertrophy, prominent at L2-L3 and L4-L5. 2. Moderate levo scoliosis. 3. Approximately 7 mm anterolisthesis of L4 on L5.  PATIENT SURVEYS:  FOTO 60; predicted 70  MUSCLE LENGTH: Hamstrings: Right 60 deg; Left 60 deg Thomas test: Right 0 deg (increased symptoms into R groin and R thigh); Left 0 deg  POSTURE:  R ASIS slightly higher in standing, with increased anterior rotation  PALPATION: TTP R>L Iliospoas, hip flexor. PA mobs on R PSIS resulted in symptoms on L PSIS  LUMBAR ROM:   Active  A/PROM  eval  Flexion 80% can feel increased symptoms on R  Extension 50% can feel in midback  Right lateral flexion To knee can feel increased symptoms on R  Left lateral flexion 50% feels   Right rotation 100%  Left rotation 100%   (Blank rows = not tested)     LOWER EXTREMITY MMT:  MMT Right eval Left eval   Hip flexion 4- 4+  Hip extension 3- 3  Hip abduction 4+ 4+  Hip adduction    Hip internal rotation    Hip external rotation    Knee flexion 5 5  Knee extension 4+ 4+  Ankle dorsiflexion    Ankle plantarflexion    Ankle inversion    Ankle eversion     (Blank rows = not tested)  LOWER EXTREMITY SPECIAL TESTS:  Hip special tests: Saralyn Pilar (FABER) test: positive  and Thomas test: positive  Supine to long sit R LE appears long to short indicating anterior rotation  GAIT: Distance walked: 150' Assistive device utilized: None Level of assistance: Complete Independence Comments: Mildly antalgic on R   TODAY'S TREATMENT  Date: 05/29/22: Therapeutic exercise: Nustep L6 x 5 min Transverse abdominal isometric 3 part core 10 sec x 10  Bridge 3 sec hold x 10  Happy baby supine rocking ~ 1 min  Sitting working on core tightening transverse abdominals and lifting chest 10 sec hold x 5 Sitting working on core tightening transverse abdominals pushing 5# KB fwd/back x 10 x 2 sets  Row blue TB x 10 x 2 sets  Shoulder extension blue TB x 10  Antirotation green TB x 10 x 2 sets each side  Step up fwd 4 in x 10 Lt/Rt using railing on Lt  Sidelying hip abduction leading with heel hips fwd 10 x 2  Sidelying hip ER knees together 10 x 2   Modalities: Moist heat x 10 min  Date: 05/24/22: Therapeutic exercise: Transverse abdominal isometric 3 part core 10 sec x 10  Shoulder extension supine core engaged 3 sec x 10  Hip abduction supine alternating green TB x 10 x 2 sets Bridge 3 sec hold x 10  Happy baby supine rocking ~ 1 min  Sitting working on core tightening transverse abdominals and lifting chest 10 sec hold x 5 Row blue TB x 10 x 2 sets  Antirotation green TB x 10 x 2 sets each side   Trial sitting on small yellow ball then folded hand towel to release pelvic floor tightness   Modalities: Moist heat x 10  min  04/27/22:Modalities: included trial of TX  Lumbar traction - intermittent Max 60# Min 45# Hold 30 sec  Release 10 sec  Time X 10 min  Moist heat with traction   PATIENT EDUCATION:  Education details: Exam findings, POC Person educated: Patient Education method: Explanation and Demonstration Education comprehension: verbalized understanding, returned demonstration, and needs further education  HOME EXERCISE PROGRAM: Access Code: [email or web address] URL: [email or web address] Date: 05/29/2022 Prepared by: Gillermo Murdoch  Exercises - Supine Transversus Abdominis Bracing with Pelvic Floor Contraction  - 2 x daily - 7 x weekly - 1 sets - 10 reps - 10sec  hold - Standing Lumbar Extension  - 2 x daily - 7 x weekly - 1 sets - 2-3 reps - 2-3 sec  hold - Seated Hip Flexor Stretch  - 2 x daily - 7 x weekly - 1 sets - 3 reps - 30 sec  hold - Standing Gluteal Sets  - 2 x daily - 7 x weekly - 1 sets - 10 reps - 10 sec  hold - Standing Bilateral Low Shoulder Row with Anchored Resistance  - 2 x daily - 7 x weekly - 1-3 sets - 10 reps - 2-3 sec  hold - Anti-Rotation Lateral Stepping with Press  - 2 x daily - 7 x weekly - 1-2 sets - 10 reps - 2-3 sec  hold - Supine Pelvic Floor Stretch  - 2 x daily - 7 x weekly - Seated Pelvic Floor Contraction  - 1-2 x daily - 7 x weekly - 1 sets - 5-10 reps - 10 sec  hold - Sidelying Clamshell in Neutral  - 1 x daily - 7 x weekly - 1-2 sets - 10 reps - 3 sec  hold - Sidelying Hip Abduction  - 1 x daily - 7 x weekly - 3 sets - 10 reps - 3-5 sec  hold ASSESSMENT:  CLINICAL IMPRESSION: 05/29/22: Patient reports improved pain in the Rt LB, posterior and lateral hip into the anterior thigh. She has not had pain in the LB or radicular pain  in the Lt LE in the past few days. She has some continued pain in the Lt. Continued happy baby for tightness in pelvic floor musculature. Added hip strengthening sidelying and progressed to 4 inche step ups. Gradually progressing  toward stated goals of therapy.   EVAL: R LE radicular symptoms. Note hip weakness, pain, R>L hip tightness and tenderness to palpation likely leading to increased lumbar extension as well as s/s of R anterior inominate rotation resulting in increased femoral nerve impingement.    GOALS: Goals reviewed with patient? Yes  SHORT TERM GOALS: Target date: 05/17/2022  Pt will be ind with initial HEP Baseline: Goal status: INITIAL  2.  Pt will report no symptoms with Marcello Moores testing Baseline:  Goal status: INITIAL   LONG TERM GOALS: Target date: 06/14/2022   Pt will be ind with initial HEP Baseline:  Goal status: INITIAL  2.  Pt will report >/=50% improved pain with amb Baseline:  Goal status: INITIAL  3.  Pt will report no more N/T in R LE Baseline:  Goal status: INITIAL  4.  Pt will improve FOTO to >/=70 Baseline:  Goal status: INITIAL  5.  Pt will demo at least 4/5 bilat LE strength for improved transfers and mobility Baseline:  Goal status: INITIAL  PLAN:  PT FREQUENCY: 2x/week  PT DURATION: 8 weeks   PLAN FOR NEXT SESSION: Progress HEP, check pelvic alignment and address as indicated. Stretch hip flexors. Manual work through iliopsoas. Hip/core strengthening.    Everardo All, PT, MPH 05/29/2022, 11:02 AM

## 2022-06-08 ENCOUNTER — Encounter: Payer: Self-pay | Admitting: Rehabilitative and Restorative Service Providers"

## 2022-06-08 ENCOUNTER — Ambulatory Visit: Payer: Medicare Other | Admitting: Rehabilitative and Restorative Service Providers"

## 2022-06-08 DIAGNOSIS — R2689 Other abnormalities of gait and mobility: Secondary | ICD-10-CM | POA: Diagnosis not present

## 2022-06-08 DIAGNOSIS — M5441 Lumbago with sciatica, right side: Secondary | ICD-10-CM | POA: Diagnosis not present

## 2022-06-08 DIAGNOSIS — R293 Abnormal posture: Secondary | ICD-10-CM | POA: Diagnosis not present

## 2022-06-08 DIAGNOSIS — M6281 Muscle weakness (generalized): Secondary | ICD-10-CM

## 2022-06-08 DIAGNOSIS — M5417 Radiculopathy, lumbosacral region: Secondary | ICD-10-CM | POA: Diagnosis not present

## 2022-06-08 DIAGNOSIS — R262 Difficulty in walking, not elsewhere classified: Secondary | ICD-10-CM

## 2022-06-08 NOTE — Therapy (Signed)
OUTPATIENT PHYSICAL THERAPY LOWER EXTREMITY TREATMENT   Patient Name: Anne [name] MRN: [id number] DOB:1951/01/07, 71 y.o., female Today's Date: 06/08/2022   PT End of Session - 06/08/22 1618     Visit Number 9    Number of Visits 12    Date for PT Re-Evaluation 05/31/22    Authorization Type Medicare    Progress Note Due on Visit 10    PT Start Time 1615    PT Stop Time 1700    PT Time Calculation (min) 45 min    Activity Tolerance Patient tolerated treatment well             Past Medical History:  Diagnosis Date   Acid reflux    Aortic atherosclerosis ([place]) 04/28/2013   CT 08/2017   Arthritis    "joints, hands" (08/30/2017)   Chronic lower back pain    Disc herniation    neck and low back   Family history of adverse reaction to anesthesia    "daughter w/PONV"   Follicular cystitis    Hiatal hernia    High cholesterol    Hyperlipidemia    ELEV CHOLESTEROL   Hypothyroidism    Osteopenia 2014   T score -1.6 FRAX 13%/1%   PAF (paroxysmal atrial fibrillation) ([place]) 08/30/2017   Echo 2/19: EF 55-60, normal wall motion, grade 1 diastolic dysfunction, mild PI, trivial TR //  Nuc 3/19: no ischemia or scar, EF 80 // CHADS2-VASc: 3 // Eliquis 5 bid    Schatzki's ring    Past Surgical History:  Procedure Laterality Date   BREAST BIOPSY Bilateral 1997   "excisional bx both benign"   BREAST CYST ASPIRATION Right ~ [place] Left 2011   CATARACT EXTRACTION W/ INTRAOCULAR LENS IMPLANT Right 12/20/2012   Dr. Haynes Bast, OD   COLONOSCOPY WITH ESOPHAGOGASTRODUODENOSCOPY (EGD)  "several"   "? dilated esophagus" (08/30/2017)   DILATION AND CURETTAGE OF UTERUS  1996   EYE MUSCLE SURGERY Bilateral 1992   HERNIA REPAIR     HYSTEROSCOPY     LAPAROSCOPIC CHOLECYSTECTOMY  09/28/2011   OOPHORECTOMY Right 04/2000   LAP RSO/LYSIS OF ADHESIONS   THYROIDECTOMY  1976   TUBAL LIGATION  1976   VARICOSE VEIN SURGERY Bilateral 1972    VENTRAL HERNIA REPAIR  06/23/13   Dr. Franz Dell   Patient Active Problem List   Diagnosis Date Noted   Globus sensation 03/29/2021   Dry mouth 03/29/2020   Seborrheic dermatitis of scalp 01/22/2020   Cervical radiculopathy 08/25/2019   History of esophagogastroduodenoscopy (EGD) 02/08/2018   Colon cancer screening 02/08/2018   Essential hypertension 12/25/2017   GERD (gastroesophageal reflux disease) 08/30/2017   Chronic diastolic (congestive) heart failure ([place]) 08/30/2017   History of atrial fibrillation 08/30/2017   Flushing 07/30/2017   Paresthesia of both hands 07/30/2017   Postsurgical hypothyroidism 07/30/2017   Aortic atherosclerosis ([place]) 04/28/2013   S/P cataract extraction 01/16/2013   Pseudophakia of both eyes 12/20/2012   Schatzki's ring 10/16/2012   Spinal stenosis of lumbar region 08/16/2012   Vaginal atrophy 11/14/2011   Graves' disease 05/05/2011   Disc herniation    Osteopenia    PERSONAL HISTORY OF ALLERGY TO LATEX 12/23/2009   UNSPECIFIED DISEASE OF PHARYNX 12/16/2008   Intermittent palpitations 11/10/2008   Hypothyroidism 09/22/2008   Hyperlipidemia 09/22/2008   ALLERGIC RHINITIS 09/22/2008    PCP: Durward Parcel  REFERRING PROVIDER: Beatrice Lecher  REFERRING DIAG: M25.551 (ICD-10-CM) - Right hip pain M54.50 (ICD-10-CM) -  Acute low back pain, unspecified back pain laterality, unspecified whether sciatica present   THERAPY DIAG:  Radiculopathy, lumbosacral region  Other abnormalities of gait and mobility  Difficulty in walking, not elsewhere classified  Muscle weakness (generalized)  Abnormal posture  Acute bilateral low back pain with right-sided sciatica  Rationale for Evaluation and Treatment Rehabilitation  ONSET DATE: ~2 weeks ago  SUBJECTIVE:   SUBJECTIVE STATEMENT: 06/08/22: Patient reports that the pain in the back and Rt LE are improving with decreased frequency and duration of pain.  Working on her exercises at home. Most pain  is in the Rt low back and into the anterior hip and thigh. She still has symptoms into Rt foot which feels a little different in the arch of foot.    PERTINENT HISTORY: Prior back pain. Pt reports waking up one morning and having a lot of pain and difficulty standing on her R LE with some numbness/tingling down R LE all the way to the bottom of her foot.  PAIN:  Are you having pain? Yes: NPRS scale: 1/10 Pain location: Soreness in the back; less pain in the Rt LE Pain description: Aching, numbness/tingling Aggravating factors: Walking, prolonged standing > 5 min Relieving factors: tens; exercise  PRECAUTIONS: None  WEIGHT BEARING RESTRICTIONS: No  FALLS:  Has patient fallen in last 6 months? No   PATIENT GOALS: Improve pain for mobility   OBJECTIVE:   DIAGNOSTIC FINDINGS: X-rays: Mild right hip osteoarthritis. IMPRESSION:1. Multilevel degenerative disc disease and facet hypertrophy, prominent at L2-L3 and L4-L5. 2. Moderate levo scoliosis. 3. Approximately 7 mm anterolisthesis of L4 on L5.  PATIENT SURVEYS:  FOTO 60; predicted 70  MUSCLE LENGTH: Hamstrings: Right 60 deg; Left 60 deg Thomas test: Right 0 deg (increased symptoms into R groin and R thigh); Left 0 deg   LUMBAR ROM:   Active  A/PROM  eval AROM 06/08/22  Flexion 80% can feel increased symptoms on R 80%   Extension 50% can feel in midback 50%  Right lateral flexion To knee can feel increased symptoms on R 75%  Left lateral flexion 50% feels  75% discomfort in Rt LB  Right rotation 100%   Left rotation 100%    (Blank rows = not tested)     LOWER EXTREMITY MMT:  MMT Right eval Left eval  Hip flexion 4- 4+  Hip extension 3- 3  Hip abduction 4+ 4+  Hip adduction    Hip internal rotation    Hip external rotation    Knee flexion 5 5  Knee extension 4+ 4+  Ankle dorsiflexion    Ankle plantarflexion    Ankle inversion    Ankle eversion     (Blank rows = not tested)  LOWER EXTREMITY SPECIAL  TESTS:  Hip special tests: Saralyn Pilar (FABER) test: positive  and Thomas test: positive  Supine to long sit R LE appears long to short indicating anterior rotation  GAIT: Distance walked: 150' Assistive device utilized: None Level of assistance: Complete Independence Comments: WNL's   TODAY'S TREATMENT                                                                           Date: 06/08/22: Therapeutic exercise: Nustep L6 x  5 min Transverse abdominal isometric 3 part core 10 sec x 10  Bridge 3 sec hold x 10  Happy baby supine rocking ~ 1 min  Sitting working on core tightening transverse abdominals and lifting chest 10 sec hold x 5 Sitting working on core tightening transverse abdominals pushing 5# KB fwd/back x 10 x 2 sets  Row blue TB x 10 x 2 sets  Shoulder extension blue TB x 10 x 2  Antirotation green TB x 10 x 2 sets each side  Step up fwd 4 in x 10 Lt/Rt using railing on Lt  Sidelying hip abduction leading with heel hips fwd 10 x 2  Sidelying hip ER knees together 10 x 2   Manual work:  Manual distraction through the sacrum and through Rt SI area pt prone   Date: 05/29/22: Therapeutic exercise: Nustep L6 x 5 min Transverse abdominal isometric 3 part core 10 sec x 10  Bridge 3 sec hold x 10  Happy baby supine rocking ~ 1 min  Sitting working on core tightening transverse abdominals and lifting chest 10 sec hold x 5 Sitting working on core tightening transverse abdominals pushing 5# KB fwd/back x 10 x 2 sets  Row blue TB x 10 x 2 sets  Shoulder extension blue TB x 10  Antirotation green TB x 10 x 2 sets each side  Step up fwd 4 in x 10 Lt/Rt using railing on Lt  Sidelying hip abduction leading with heel hips fwd 10 x 2  Sidelying hip ER knees together 10 x 2   Modalities: Moist heat x 10 min   PATIENT EDUCATION:  Education details: Exam findings, POC Person educated: Patient Education method: Explanation and Demonstration Education comprehension: verbalized  understanding, returned demonstration, and needs further education  HOME EXERCISE PROGRAM: Access Code: [email or web address] URL: [email or web address] Date: 05/29/2022 Prepared by: Gillermo Murdoch  Exercises - Supine Transversus Abdominis Bracing with Pelvic Floor Contraction  - 2 x daily - 7 x weekly - 1 sets - 10 reps - 10sec  hold - Standing Lumbar Extension  - 2 x daily - 7 x weekly - 1 sets - 2-3 reps - 2-3 sec  hold - Seated Hip Flexor Stretch  - 2 x daily - 7 x weekly - 1 sets - 3 reps - 30 sec  hold - Standing Gluteal Sets  - 2 x daily - 7 x weekly - 1 sets - 10 reps - 10 sec  hold - Standing Bilateral Low Shoulder Row with Anchored Resistance  - 2 x daily - 7 x weekly - 1-3 sets - 10 reps - 2-3 sec  hold - Anti-Rotation Lateral Stepping with Press  - 2 x daily - 7 x weekly - 1-2 sets - 10 reps - 2-3 sec  hold - Supine Pelvic Floor Stretch  - 2 x daily - 7 x weekly - Seated Pelvic Floor Contraction  - 1-2 x daily - 7 x weekly - 1 sets - 5-10 reps - 10 sec  hold - Sidelying Clamshell in Neutral  - 1 x daily - 7 x weekly - 1-2 sets - 10 reps - 3 sec  hold - Sidelying Hip Abduction  - 1 x daily - 7 x weekly - 3 sets - 10 reps - 3-5 sec  hold ASSESSMENT:  CLINICAL IMPRESSION: 06/08/22: Patient reports improved pain in the Rt LB, posterior and lateral hip into the anterior thigh. She has less pain in the LB or radicular pain in the Lt  LE. She feels stiffness in the back in the mornings when getting out of bed. She has returned to normal functional activities. Continued happy baby for tightness in pelvic floor musculature. Gradually progressing toward stated goals of therapy.   EVAL: R LE radicular symptoms. Note hip weakness, pain, R>L hip tightness and tenderness to palpation likely leading to increased lumbar extension as well as s/s of R anterior inominate rotation resulting in increased femoral nerve impingement.    GOALS: Goals reviewed with patient? Yes  SHORT TERM GOALS: Target  date: 05/17/2022  Pt will be ind with initial HEP Baseline: Goal status: INITIAL  2.  Pt will report no symptoms with Marcello Moores testing Baseline:  Goal status: INITIAL   LONG TERM GOALS: Target date: 06/14/2022   Pt will be ind with initial HEP Baseline:  Goal status: INITIAL  2.  Pt will report >/=50% improved pain with amb Baseline:  Goal status: INITIAL  3.  Pt will report no more N/T in R LE Baseline:  Goal status: INITIAL  4.  Pt will improve FOTO to >/=70 Baseline:  Goal status: INITIAL  5.  Pt will demo at least 4/5 bilat LE strength for improved transfers and mobility Baseline:  Goal status: INITIAL  PLAN:  PT FREQUENCY: 2x/week  PT DURATION: 8 weeks   PLAN FOR NEXT SESSION: Progress HEP, check pelvic alignment and address as indicated. Stretch hip flexors. Manual work through iliopsoas. Hip/core strengthening.    Everardo All, PT, MPH 06/08/2022, 4:19 PM

## 2022-06-15 ENCOUNTER — Ambulatory Visit: Payer: Medicare Other | Admitting: Rehabilitative and Restorative Service Providers"

## 2022-06-15 DIAGNOSIS — M6281 Muscle weakness (generalized): Secondary | ICD-10-CM

## 2022-06-15 DIAGNOSIS — R293 Abnormal posture: Secondary | ICD-10-CM | POA: Diagnosis not present

## 2022-06-15 DIAGNOSIS — R262 Difficulty in walking, not elsewhere classified: Secondary | ICD-10-CM | POA: Diagnosis not present

## 2022-06-15 DIAGNOSIS — R2689 Other abnormalities of gait and mobility: Secondary | ICD-10-CM

## 2022-06-15 DIAGNOSIS — M5441 Lumbago with sciatica, right side: Secondary | ICD-10-CM

## 2022-06-15 DIAGNOSIS — M5417 Radiculopathy, lumbosacral region: Secondary | ICD-10-CM

## 2022-06-15 NOTE — Therapy (Signed)
OUTPATIENT PHYSICAL THERAPY LOWER EXTREMITY TREATMENT and MEDICARE 10th VISIT NOTE  Progress Note Reporting Period 04/19/22 to 06/15/22  See note below for Objective Data and Assessment of Progress/Goals.     Patient Name: Anne [name] MRN: [id number] DOB:27-May-1951, 71 y.o., female Today's Date: 06/15/2022   PT End of Session - 06/15/22 1320     Visit Number 10    Number of Visits 12    Date for PT Re-Evaluation 05/31/22    Authorization Type Medicare    Progress Note Due on Visit 10    PT Start Time 1318    PT Stop Time 1406    PT Time Calculation (min) 48 min    Activity Tolerance Patient tolerated treatment well             Past Medical History:  Diagnosis Date   Acid reflux    Aortic atherosclerosis ([place]) 04/28/2013   CT 08/2017   Arthritis    "joints, hands" (08/30/2017)   Chronic lower back pain    Disc herniation    neck and low back   Family history of adverse reaction to anesthesia    "daughter w/PONV"   Follicular cystitis    Hiatal hernia    High cholesterol    Hyperlipidemia    ELEV CHOLESTEROL   Hypothyroidism    Osteopenia 2014   T score -1.6 FRAX 13%/1%   PAF (paroxysmal atrial fibrillation) ([place]) 08/30/2017   Echo 2/19: EF 55-60, normal wall motion, grade 1 diastolic dysfunction, mild PI, trivial TR //  Nuc 3/19: no ischemia or scar, EF 80 // CHADS2-VASc: 3 // Eliquis 5 bid    Schatzki's ring    Past Surgical History:  Procedure Laterality Date   BREAST BIOPSY Bilateral 1997   "excisional bx both benign"   BREAST CYST ASPIRATION Right ~ [place] Left 2011   CATARACT EXTRACTION W/ INTRAOCULAR LENS IMPLANT Right 12/20/2012   Dr. Haynes Bast, OD   COLONOSCOPY WITH ESOPHAGOGASTRODUODENOSCOPY (EGD)  "several"   "? dilated esophagus" (08/30/2017)   DILATION AND CURETTAGE OF UTERUS  1996   EYE MUSCLE SURGERY Bilateral 1992   HERNIA REPAIR     HYSTEROSCOPY     LAPAROSCOPIC CHOLECYSTECTOMY   09/28/2011   OOPHORECTOMY Right 04/2000   LAP RSO/LYSIS OF ADHESIONS   THYROIDECTOMY  1976   TUBAL LIGATION  1976   VARICOSE VEIN SURGERY Bilateral 1972   VENTRAL HERNIA REPAIR  06/23/13   Dr. Franz Dell   Patient Active Problem List   Diagnosis Date Noted   Globus sensation 03/29/2021   Dry mouth 03/29/2020   Seborrheic dermatitis of scalp 01/22/2020   Cervical radiculopathy 08/25/2019   History of esophagogastroduodenoscopy (EGD) 02/08/2018   Colon cancer screening 02/08/2018   Essential hypertension 12/25/2017   GERD (gastroesophageal reflux disease) 08/30/2017   Chronic diastolic (congestive) heart failure ([place]) 08/30/2017   History of atrial fibrillation 08/30/2017   Flushing 07/30/2017   Paresthesia of both hands 07/30/2017   Postsurgical hypothyroidism 07/30/2017   Aortic atherosclerosis ([place]) 04/28/2013   S/P cataract extraction 01/16/2013   Pseudophakia of both eyes 12/20/2012   Schatzki's ring 10/16/2012   Spinal stenosis of lumbar region 08/16/2012   Vaginal atrophy 11/14/2011   Graves' disease 05/05/2011   Disc herniation    Osteopenia    PERSONAL HISTORY OF ALLERGY TO LATEX 12/23/2009   UNSPECIFIED DISEASE OF PHARYNX 12/16/2008   Intermittent palpitations 11/10/2008   Hypothyroidism 09/22/2008   Hyperlipidemia 09/22/2008  ALLERGIC RHINITIS 09/22/2008    PCP: Durward Parcel  REFERRING PROVIDER: Beatrice Lecher  REFERRING DIAG: M25.551 (ICD-10-CM) - Right hip pain M54.50 (ICD-10-CM) - Acute low back pain, unspecified back pain laterality, unspecified whether sciatica present   THERAPY DIAG:  Radiculopathy, lumbosacral region  Other abnormalities of gait and mobility  Difficulty in walking, not elsewhere classified  Muscle weakness (generalized)  Abnormal posture  Acute bilateral low back pain with right-sided sciatica  Rationale for Evaluation and Treatment Rehabilitation  ONSET DATE: ~2 weeks ago  SUBJECTIVE:   SUBJECTIVE STATEMENT: Patient  reports that the pain in the back and Rt LE continues to improve with decreased frequency and duration of pain.  Working on her exercises at home. Most pain is in the Rt low back and into the anterior hip and thigh. She has no symptoms into Rt foot. Feels confident in continuing with independent HEP.    PERTINENT HISTORY: Prior back pain. Pt reports waking up one morning and having a lot of pain and difficulty standing on her R LE with some numbness/tingling down R LE all the way to the bottom of her foot.  PAIN:  Are you having pain? Yes: NPRS scale: 0/10 Pain location: Soreness in the back; less pain in the Rt LE Pain description: Aching, numbness/tingling Aggravating factors: Walking, prolonged standing > 5 min Relieving factors: tens; exercise  PRECAUTIONS: None  WEIGHT BEARING RESTRICTIONS: No  FALLS:  Has patient fallen in last 6 months? No   PATIENT GOALS: Improve pain for mobility   OBJECTIVE:   DIAGNOSTIC FINDINGS: X-rays: Mild right hip osteoarthritis. IMPRESSION:1. Multilevel degenerative disc disease and facet hypertrophy, prominent at L2-L3 and L4-L5. 2. Moderate levo scoliosis. 3. Approximately 7 mm anterolisthesis of L4 on L5.  PATIENT SURVEYS:  FOTO 60; predicted 70  MUSCLE LENGTH: Hamstrings: Right 60 deg; Left 60 deg Thomas test: Right 0 deg (increased symptoms into R groin and R thigh); Left 0 deg   LUMBAR ROM:   Active  A/PROM  eval AROM 06/08/22  Flexion 80% can feel increased symptoms on R 80%   Extension 50% can feel in midback 50%  Right lateral flexion To knee can feel increased symptoms on R 75%  Left lateral flexion 50% feels  75% discomfort in Rt LB  Right rotation 100%   Left rotation 100%    (Blank rows = not tested)     LOWER EXTREMITY MMT:  MMT Right eval Left eval  Hip flexion 4- 4+  Hip extension 3- 3  Hip abduction 4+ 4+  Hip adduction    Hip internal rotation    Hip external rotation    Knee flexion 5 5  Knee  extension 4+ 4+  Ankle dorsiflexion    Ankle plantarflexion    Ankle inversion    Ankle eversion     (Blank rows = not tested)  TODAY'S TREATMENT                                                                           Date: 06/15/22:  Therapeutic exercise: Nustep L6 x 6 min Transverse abdominal isometric 3 part core 10 sec x 10  Bridge 3 sec hold x 10  Happy baby supine rocking ~ 1  min  Sitting working on core tightening transverse abdominals and lifting chest 10 sec hold x 5 Sitting working on core tightening transverse abdominals pushing 5# KB fwd/back x 10 x 2 sets  Row blue TB x 10 x 2 sets  Shoulder extension blue TB x 10 x 2  Antirotation green TB x 10 x 2 sets each side  Step up fwd 4 in x 10 Lt/Rt using railing on Lt  Lateral step up 4 in step Rt x 10; Lt x 5  Sidelying hip abduction leading with heel hips fwd 10 x 2  Sidelying hip ER knees together 10 x 2   Reviewed HEP and offered suggestions for progression of exercises  Date: 06/08/22: Therapeutic exercise: Nustep L6 x 5 min Transverse abdominal isometric 3 part core 10 sec x 10  Bridge 3 sec hold x 10  Happy baby supine rocking ~ 1 min  Sitting working on core tightening transverse abdominals and lifting chest 10 sec hold x 5 Sitting working on core tightening transverse abdominals pushing 5# KB fwd/back x 10 x 2 sets  Row blue TB x 10 x 2 sets  Shoulder extension blue TB x 10 x 2  Antirotation green TB x 10 x 2 sets each side  Step up fwd 4 in x 10 Lt/Rt using railing on Lt  Sidelying hip abduction leading with heel hips fwd 10 x 2  Sidelying hip ER knees together 10 x 2   Manual work:  Manual distraction through the sacrum and through Rt SI area pt prone   PATIENT EDUCATION:  Education details: Exam findings, POC Person educated: Patient Education method: Customer service manager Education comprehension: verbalized understanding, returned demonstration, and needs further education  HOME EXERCISE  PROGRAM: Access Code: [email or web address] URL: [email or web address] Date: 06/15/2022 Prepared by: Gillermo Murdoch  Exercises - Supine Transversus Abdominis Bracing with Pelvic Floor Contraction  - 2 x daily - 7 x weekly - 1 sets - 10 reps - 10sec  hold - Standing Lumbar Extension  - 2 x daily - 7 x weekly - 1 sets - 2-3 reps - 2-3 sec  hold - Seated Hip Flexor Stretch  - 2 x daily - 7 x weekly - 1 sets - 3 reps - 30 sec  hold - Standing Gluteal Sets  - 2 x daily - 7 x weekly - 1 sets - 10 reps - 10 sec  hold - Standing Bilateral Low Shoulder Row with Anchored Resistance  - 2 x daily - 7 x weekly - 1-3 sets - 10 reps - 2-3 sec  hold - Anti-Rotation Lateral Stepping with Press  - 2 x daily - 7 x weekly - 1-2 sets - 10 reps - 2-3 sec  hold - Supine Pelvic Floor Stretch  - 2 x daily - 7 x weekly - Seated Pelvic Floor Contraction  - 1-2 x daily - 7 x weekly - 1 sets - 5-10 reps - 10 sec  hold - Sidelying Clamshell in Neutral  - 1 x daily - 7 x weekly - 1-2 sets - 10 reps - 3 sec  hold - Sidelying Hip Abduction  - 1 x daily - 7 x weekly - 3 sets - 10 reps - 3-5 sec  hold - Lateral Step Up  - 2 x daily - 7 x weekly - 2 sets - 10 reps - 2 sec  hold  ASSESSMENT:  CLINICAL IMPRESSION: 06/15/22: Patient reports improved pain in the Rt LB, posterior and lateral hip into  the anterior thigh. She has less pain in the LB and no radicular pain in the Lt LE. Goals of therapy have been accomplished. Patient will call with any questions or problems.   EVAL: R LE radicular symptoms. Note hip weakness, pain, R>L hip tightness and tenderness to palpation likely leading to increased lumbar extension as well as s/s of R anterior inominate rotation resulting in increased femoral nerve impingement.    GOALS: Goals reviewed with patient? Yes  SHORT TERM GOALS: Target date: 05/17/2022  Pt will be ind with initial HEP Baseline: Goal status: MET  2.  Pt will report no symptoms with Marcello Moores testing Baseline:   Goal status: MET   LONG TERM GOALS: Target date: 06/14/2022   Pt will be ind with initial HEP Baseline:  Goal status: MET  2.  Pt will report >/=50% improved pain with amb Baseline:  Goal status: MET  3.  Pt will report no more N/T in R LE Baseline:  Goal status: MET  4.  Pt will improve FOTO to >/=70 Baseline: 06/15/22 = 72 Goal status: MET  5.  Pt will demo at least 4/5 bilat LE strength for improved transfers and mobility Baseline:  Goal status: MET  PLAN:  PT FREQUENCY: 2x/week  PT DURATION: 8 weeks   PLAN FOR NEXT SESSION: Place patient on hold x 4 weeks. She will continue with independent HEP and call with any questions or problems.    Everardo All, PT, MPH 06/15/2022, 1:22 PM

## 2022-07-18 ENCOUNTER — Other Ambulatory Visit: Payer: Self-pay | Admitting: Family Medicine

## 2022-08-21 ENCOUNTER — Telehealth: Payer: Self-pay | Admitting: Family Medicine

## 2022-08-21 NOTE — Telephone Encounter (Signed)
Contacted Anne [name] to schedule their annual wellness visit. Appointment made for 08/29/22 at [facility] Direct Dial: [phone]

## 2022-08-29 ENCOUNTER — Ambulatory Visit (INDEPENDENT_AMBULATORY_CARE_PROVIDER_SITE_OTHER): Payer: Medicare Other | Admitting: Family Medicine

## 2022-08-29 DIAGNOSIS — Z0000 Encounter for general adult medical examination without abnormal findings: Secondary | ICD-10-CM | POA: Diagnosis not present

## 2022-08-29 NOTE — Progress Notes (Signed)
MEDICARE ANNUAL WELLNESS VISIT  08/29/2022  Telephone Visit Disclaimer This Medicare AWV was conducted by telephone due to national recommendations for restrictions regarding the COVID-19 Pandemic (e.g. social distancing).  I verified, using two identifiers, that I am speaking with Anne [name] or their authorized healthcare agent. I discussed the limitations, risks, security, and privacy concerns of performing an evaluation and management service by telephone and the potential availability of an in-person appointment in the future. The patient expressed understanding and agreed to proceed.  Location of Patient: Home Location of Provider (nurse):  in the office.  Subjective:    Anne [name] is a 72 y.o. female patient of Metheney, Rene Kocher, MD who had a Medicare Annual Wellness Visit today via telephone. Jacey is Retired and lives with their spouse. she has 1 child. she reports that she is socially active and does interact with friends/family regularly. she is moderately physically active and enjoys day trips, yard work, and cooking.  Patient Care Team: [name], MD as PCP - General (Family Medicine) Nahser, Wonda Cheng, MD as PCP - Cardiology (Cardiology) Gwendel Hanson, MD as Referring Physician (Urology) Phineas Real, Belinda Block, MD (Inactive) as Consulting Physician (Gynecology) Jyl Heinz, MD as Referring Physician (Endocrinology) Vernie Ammons, MD as Referring Physician (Dermatology) Darius Bump, [facility] as Pharmacist (Pharmacist)     08/29/2022    4:04 PM 04/19/2022   11:08 AM 03/30/2021   10:08 AM 08/09/2020    3:12 PM 08/04/2019    3:45 PM 12/06/2017    8:39 PM 08/30/2017    3:18 AM  Advanced Directives  Does Patient Have a Medical Advance Directive? No No No No No No No  Would patient like information on creating a medical advance directive? No - Patient declined No - Patient declined  No - Patient declined No - Patient  declined No - Patient declined No - Patient declined    Hospital Utilization Over the Past 12 Months: # of hospitalizations or ER visits: 0 # of surgeries: 0  Review of Systems    Patient reports that her overall health is better compared to last year.  History obtained from chart review and the patient  Patient Reported Readings (BP, Pulse, CBG, Weight, etc) none  Pain Assessment Pain : No/denies pain     Current Medications & Allergies (verified) Allergies as of 08/29/2022       Reactions   Amoxicillin-pot Clavulanate    REACTION: blisters in colon (Augmentin)   Ceftriaxone Sodium    REACTION: throat swells (ROCEPHIN)   Cephalexin    Codeine    REACTION: nausea   Molds & Smuts    Zofran [ondansetron Hcl]    Headaches        Medication List        Accurate as of August 29, 2022  4:20 PM. If you have any questions, ask your nurse or doctor.          acetaminophen 325 MG tablet Commonly known as: TYLENOL Take 650 mg by mouth every 6 (six) hours as needed.   CALCIUM PO Take 1 tablet by mouth daily.   clobetasol 0.05 % external solution Commonly known as: TEMOVATE Apply 1 application topically 2 (two) times daily.   estradiol 0.1 MG/GM vaginal cream Commonly known as: ESTRACE Place [id number] Applicatorfuls vaginally as needed. For vaginal irritation   meloxicam 15 MG tablet Commonly known as: MOBIC TAKE 1 TABLET (15 MG TOTAL) BY MOUTH DAILY.   metoprolol succinate  50 MG 24 hr tablet Commonly known as: TOPROL-XL TAKE 1 AND 1/2 TABS DAILY TO = 75 MG DAILY   multivitamin tablet Take 1 tablet by mouth daily.   omeprazole 40 MG capsule Commonly known as: PRILOSEC TAKE 1 CAPSULE (40 MG TOTAL) BY MOUTH DAILY.   predniSONE 20 MG tablet Commonly known as: DELTASONE Take 2 tablets (40 mg total) by mouth daily with breakfast.   simvastatin 40 MG tablet Commonly known as: ZOCOR TAKE 1 TABLET (40 MG TOTAL) BY MOUTH DAILY. NEEDS LABS   Synthroid 75 MCG  tablet Generic drug: levothyroxine TAKE 1 TABLET BY MOUTH EVERY DAY BEFORE BREAKFAST   traMADol 50 MG tablet Commonly known as: ULTRAM Take 1 tablet (50 mg total) by mouth at bedtime as needed.   Vitamin D 50 MCG (2000 UT) tablet Take 2,000 Units by mouth daily.        History (reviewed): Past Medical History:  Diagnosis Date   Acid reflux    Aortic atherosclerosis ([place]) 04/28/2013   CT 08/2017   Arthritis    "joints, hands" (08/30/2017)   Chronic lower back pain    Disc herniation    neck and low back   Family history of adverse reaction to anesthesia    "daughter w/PONV"   Follicular cystitis    Hiatal hernia    High cholesterol    Hyperlipidemia    ELEV CHOLESTEROL   Hypothyroidism    Osteopenia 2014   T score -1.6 FRAX 13%/1%   PAF (paroxysmal atrial fibrillation) ([place]) 08/30/2017   Echo 2/19: EF 55-60, normal wall motion, grade 1 diastolic dysfunction, mild PI, trivial TR //  Nuc 3/19: no ischemia or scar, EF 80 // CHADS2-VASc: 3 // Eliquis 5 bid    Schatzki's ring    Past Surgical History:  Procedure Laterality Date   BREAST BIOPSY Bilateral 1997   "excisional bx both benign"   BREAST CYST ASPIRATION Right ~ [place] Left 2011   CATARACT EXTRACTION W/ INTRAOCULAR LENS IMPLANT Right 12/20/2012   Dr. Haynes Bast, OD   COLONOSCOPY WITH ESOPHAGOGASTRODUODENOSCOPY (EGD)  "several"   "? dilated esophagus" (08/30/2017)   DILATION AND CURETTAGE OF UTERUS  1996   EYE MUSCLE SURGERY Bilateral 1992   HERNIA REPAIR     HYSTEROSCOPY     LAPAROSCOPIC CHOLECYSTECTOMY  09/28/2011   OOPHORECTOMY Right 04/2000   LAP RSO/LYSIS OF ADHESIONS   THYROIDECTOMY  1976   TUBAL LIGATION  1976   VARICOSE VEIN SURGERY Bilateral 1972   VENTRAL HERNIA REPAIR  06/23/13   Dr. Franz Dell   Family History  Problem Relation Age of Onset   Hypertension Mother    Heart attack Father 54   Heart disease Father    Anuerysm Father    Stroke  Sister    Hypertension Sister    Arthritis Sister    Lupus Sister    Cancer Sister    Breast cancer Sister        Age 65   Rheum arthritis Sister    Stroke Sister    Cancer Brother 36       melanoma   Melanoma Brother    Prostate cancer Brother    Leukemia Brother    Pulmonary fibrosis Brother    Arthritis Daughter        Rheumatoid   Hyperparathyroidism Daughter    Social History   Socioeconomic History   Marital status: Married    Spouse name: Horris Latino  Number of children: 1   Years of education: 10   Highest education level: GED or equivalent  Occupational History   Occupation: Teaching laboratory technician    Comment: retired  Tobacco Use   Smoking status: Former    Packs/day: 0.10    Years: 18.00    Total pack years: 1.80    Types: Cigarettes    Quit date: 01/27/1979    Years since quitting: 43.6    Passive exposure: Past   Smokeless tobacco: Never  Vaping Use   Vaping Use: Never used  Substance and Sexual Activity   Alcohol use: Yes    Alcohol/week: 1.0 standard drink of alcohol    Types: 1 Glasses of wine per week    Comment: 2 glasses of wine a month   Drug use: No   Sexual activity: Not Currently    Partners: Male    Birth control/protection: Post-menopausal  Other Topics Concern   Not on file  Social History Narrative   Tries to go the park to walk with her husband. Likes to [employer], day trips, and yard work.   Social Determinants of Health   Financial Resource Strain: Low Risk  (08/29/2022)   Overall Financial Resource Strain (CARDIA)    Difficulty of Paying Living Expenses: Not hard at all  Food Insecurity: No Food Insecurity (08/29/2022)   Hunger Vital Sign    Worried About Running Out of Food in the Last Year: Never true    Ran Out of Food in the Last Year: Never true  Transportation Needs: No Transportation Needs (08/29/2022)   PRAPARE - Hydrologist (Medical): No    Lack of Transportation (Non-Medical): No  Physical Activity:  Insufficiently Active (08/29/2022)   Exercise Vital Sign    Days of Exercise per Week: 3 days    Minutes of Exercise per Session: 20 min  Stress: No Stress Concern Present (08/29/2022)   [place]    Feeling of Stress : Not at all  Social Connections: Moderately Isolated (08/29/2022)   Social Connection and Isolation Panel [NHANES]    Frequency of Communication with Friends and Family: More than three times a week    Frequency of Social Gatherings with Friends and Family: Twice a week    Attends Religious Services: Never    Marine scientist or Organizations: No    Attends Archivist Meetings: Never    Marital Status: Married    Activities of Daily Living    08/29/2022    4:08 PM  In your present state of health, do you have any difficulty performing the following activities:  Hearing? 0  Vision? 0  Difficulty concentrating or making decisions? 0  Walking or climbing stairs? 0  Dressing or bathing? 0  Doing errands, shopping? 0  Preparing Food and eating ? N  Using the Toilet? N  In the past six months, have you accidently leaked urine? N  Do you have problems with loss of bowel control? N  Managing your Medications? N  Managing your Finances? N  Housekeeping or managing your Housekeeping? N    Patient Education/ Literacy How often do you need to have someone help you when you read instructions, pamphlets, or other written materials from your doctor or pharmacy?: 1 - Never What is the last grade level you completed in school?: GED  Exercise Current Exercise Habits: Home exercise routine, Type of exercise: walking, Time (Minutes): 20, Frequency (Times/Week):  3, Weekly Exercise (Minutes/Week): 60, Intensity: Moderate, Exercise limited by: None identified  Diet Patient reports consuming 3 meals a day and 4 snack(s) a day Patient reports that her primary diet is: Regular Patient reports that she does  have regular access to food.   Depression Screen    08/29/2022    4:04 PM 04/06/2022    1:36 PM 10/04/2021    2:21 PM 07/05/2021    2:32 PM 08/09/2020    3:14 PM 08/04/2019    3:46 PM 02/19/2019   10:03 AM  PHQ 2/9 Scores  PHQ - 2 Score 0 0 0 0 0 0 0  PHQ- 9 Score     0       Fall Risk    08/29/2022    4:04 PM 04/06/2022    1:36 PM 10/04/2021    2:21 PM 07/05/2021    2:32 PM 08/09/2020    3:14 PM  [place] in the past year? 0 0 0 0 0  Number falls in past yr: 0 0 0 0 0  Injury with Fall? 0 0 0 0 0  Risk for fall due to : No Fall Risks No Fall Risks No Fall Risks No Fall Risks No Fall Risks  Follow up Falls evaluation completed Falls evaluation completed Falls prevention discussed;Falls evaluation completed Falls prevention discussed;Falls evaluation completed Falls evaluation completed     Objective:  Keelynn Zetina seemed alert and oriented and she participated appropriately during our telephone visit.  Blood Pressure Weight BMI  BP Readings from Last 3 Encounters:  04/06/22 133/66  03/20/22 128/76  03/14/22 128/77   Wt Readings from Last 3 Encounters:  04/06/22 187 lb (84.8 kg)  03/20/22 182 lb (82.6 kg)  03/14/22 187 lb (84.8 kg)   BMI Readings from Last 1 Encounters:  04/06/22 31.12 kg/m    *Unable to obtain current vital signs, weight, and BMI due to telephone visit type  Hearing/Vision  Enid Derry did not seem to have difficulty with hearing/understanding during the telephone conversation Reports that she has had a formal eye exam by an eye care professional within the past year Reports that she has not had a formal hearing evaluation within the past year *Unable to fully assess hearing and vision during telephone visit type  Cognitive Function:    08/29/2022    4:12 PM 08/09/2020    3:28 PM 08/04/2019    3:49 PM 02/08/2017   10:52 AM  6CIT Screen  What Year? 0 points 0 points 0 points 0 points  What month? 0 points 0 points 0 points 0 points  What  time? 0 points 0 points 0 points 0 points  Count back from 20 0 points 0 points 0 points 0 points  Months in reverse 0 points 2 points 0 points 0 points  Repeat phrase 0 points 0 points 0 points 0 points  Total Score 0 points 2 points 0 points 0 points   (Normal:0-7, Significant for Dysfunction: >8)  Normal Cognitive Function Screening: Yes   Immunization & Health Maintenance Record Immunization History  Administered Date(s) Administered   Influenza Split 06/02/2011, 05/03/2012   Influenza Whole 04/16/2009   Influenza,inj,Quad PF,6+ Mos 05/01/2013, 04/17/2014   Pneumococcal Conjugate-13 10/17/2013   Pneumococcal Polysaccharide-23 02/04/2016   Td 01/21/2010   Zoster, Live 08/18/2011    Health Maintenance  Topic Date Due   DTaP/Tdap/Td (2 - Tdap) 01/22/2020   COVID-19 Vaccine (1) 09/14/2022 (Originally 03/12/1951)   INFLUENZA VACCINE  09/24/2022 (  Originally 01/24/2022)   Zoster Vaccines- Shingrix (1 of 2) 11/29/2022 (Originally 09/10/1998)   COLONOSCOPY (Pts 45-35yr Insurance coverage will need to be confirmed)  02/09/2023   Medicare Annual Wellness (AWV)  08/29/2023   MAMMOGRAM  03/30/2024   DEXA SCAN  10/13/2026   Pneumonia Vaccine 72 Years old  Completed   Hepatitis C Screening  Completed   HPV VACCINES  Aged Out       Assessment  This is a routine wellness examination for [employer]  Health Maintenance: Due or Overdue Health Maintenance Due  Topic Date Due   DTaP/Tdap/Td (2 - Tdap) 01/22/2020    SJacklynn Ganongdoes not need a referral for Community Assistance: Care Management:   no Social Work:    no Prescription Assistance:  no Nutrition/Diabetes Education:  no   Plan:  Personalized Goals  Goals Addressed               This Visit's Progress     Patient Stated (pt-stated)        Patient stated she would like to loose 20 lbs.       Personalized Health Maintenance & Screening Recommendations  Influenza vaccine Td  vaccine Shingles vaccine Colorectal cancer screening due in August. Last one [facility] LExie Parody Declines influenza vaccine.  Lung Cancer Screening Recommended: no (Low Dose CT Chest recommended if Age 56100-80years, 30 pack-year currently smoking OR have quit w/in past 15 years) Hepatitis C Screening recommended: no HIV Screening recommended: no  Advanced Directives: Written information was not prepared per patient's request.  Referrals & Orders No orders of the defined types were placed in this encounter.   Follow-up Plan Follow-up with MHali Marry MD as planned Schedule shingles vaccine and tetanus at the pharmacy. Medicare wellness visit in one year.  Patient will access AVS on my chart.   I have personally reviewed and noted the following in the patient's chart:   Medical and social history Use of alcohol, tobacco or illicit drugs  Current medications and supplements Functional ability and status Nutritional status Physical activity Advanced directives List of other physicians Hospitalizations, surgeries, and ER visits in previous 12 months Vitals Screenings to include cognitive, depression, and falls Referrals and appointments  In addition, I have reviewed and discussed with SJacklynn Ganongcertain preventive protocols, quality metrics, and best practice recommendations. A written personalized care plan for preventive services as well as general preventive health recommendations is available and can be mailed to the patient at her request.      BTinnie Gens RN BSN  08/29/2022

## 2022-08-29 NOTE — Patient Instructions (Addendum)
[place] Maintenance Summary and Written Plan of Care  Anne [name] ,  Thank you for allowing me to perform your Medicare Annual Wellness Visit and for your ongoing commitment to your health.   Health Maintenance & Immunization History Health Maintenance  Topic Date Due   DTaP/Tdap/Td (2 - Tdap) 01/22/2020   COVID-19 Vaccine (1) 09/14/2022 (Originally 03/12/1951)   INFLUENZA VACCINE  09/24/2022 (Originally 01/24/2022)   Zoster Vaccines- Shingrix (1 of 2) 11/29/2022 (Originally 09/10/1998)   COLONOSCOPY (Pts 45-47yr Insurance coverage will need to be confirmed)  02/09/2023   Medicare Annual Wellness (AWV)  08/29/2023   MAMMOGRAM  03/30/2024   DEXA SCAN  10/13/2026   Pneumonia Vaccine 72 Years old  Completed   Hepatitis C Screening  Completed   HPV VACCINES  Aged Out   Immunization History  Administered Date(s) Administered   Influenza Split 06/02/2011, 05/03/2012   Influenza Whole 04/16/2009   Influenza,inj,Quad PF,6+ Mos 05/01/2013, 04/17/2014   Pneumococcal Conjugate-13 10/17/2013   Pneumococcal Polysaccharide-23 02/04/2016   Td 01/21/2010   Zoster, Live 08/18/2011    These are the patient goals that we discussed:  Goals Addressed               This Visit's Progress     Patient Stated (pt-stated)        Patient stated she would like to loose 20 lbs.         This is a list of Health Maintenance Items that are overdue or due now: Health Maintenance Due  Topic Date Due   DTaP/Tdap/Td (2 - Tdap) 01/22/2020   Influenza vaccine Td vaccine Shingles vaccine Colorectal cancer screening due in August. Last one completed on 02/09/23 [facility] LExie [name] Declines influenza vaccine  Orders/Referrals Placed Today: No orders of the defined types were placed in this encounter.  (Contact our referral department at [phone] you have not spoken with someone about your referral appointment within the next 5 days)     Follow-up Plan Follow-up with Anne Marry MD as planned Schedule shingles vaccine and tetanus at the pharmacy. Medicare wellness visit in one year.  Patient will access AVS on my chart.      Health Maintenance, Female Adopting a healthy lifestyle and getting preventive care are important in promoting health and wellness. Ask your health care provider about: The right schedule for you to have regular tests and exams. Things you can do on your own to prevent diseases and keep yourself healthy. What should I know about diet, weight, and exercise? Eat a healthy diet  Eat a diet that includes plenty of vegetables, fruits, low-fat dairy products, and lean protein. Do not eat a lot of foods that are high in solid fats, added sugars, or sodium. Maintain a healthy weight Body mass index (BMI) is used to identify weight problems. It estimates body fat based on height and weight. Your health care provider can help determine your BMI and help you achieve or maintain a healthy weight. Get regular exercise Get regular exercise. This is one of the most important things you can do for your health. Most adults should: Exercise for at least 150 minutes each week. The exercise should increase your heart rate and make you sweat (moderate-intensity exercise). Do strengthening exercises at least twice a week. This is in addition to the moderate-intensity exercise. Spend less time sitting. Even light physical activity can be beneficial. Watch cholesterol and blood lipids Have your blood  tested for lipids and cholesterol at 74 years of age, then have this test every 5 years. Have your cholesterol levels checked more often if: Your lipid or cholesterol levels are high. You are older than 74 years of age. You are at high risk for heart disease. What should I know about cancer screening? Depending on your health history and family history, you may need to have cancer screening at various ages.  This may include screening for: Breast cancer. Cervical cancer. Colorectal cancer. Skin cancer. Lung cancer. What should I know about heart disease, diabetes, and high blood pressure? Blood pressure and heart disease High blood pressure causes heart disease and increases the risk of stroke. This is more likely to develop in people who have high blood pressure readings or are overweight. Have your blood pressure checked: Every 3-5 years if you are 7-14 years of age. Every year if you are 32 years old or older. Diabetes Have regular diabetes screenings. This checks your fasting blood sugar level. Have the screening done: Once every three years after age 63 if you are at a normal weight and have a low risk for diabetes. More often and at a younger age if you are overweight or have a high risk for diabetes. What should I know about preventing infection? Hepatitis B If you have a higher risk for hepatitis B, you should be screened for this virus. Talk with your health care provider to find out if you are at risk for hepatitis B infection. Hepatitis C Testing is recommended for: Everyone born from [birth year] through 1967. Anyone with known risk factors for hepatitis C. Sexually transmitted infections (STIs) Get screened for STIs, including gonorrhea and chlamydia, if: You are sexually active and are younger than 74 years of age. You are older than 74 years of age and your health care provider tells you that you are at risk for this type of infection. Your sexual activity has changed since you were last screened, and you are at increased risk for chlamydia or gonorrhea. Ask your health care provider if you are at risk. Ask your health care provider about whether you are at high risk for HIV. Your health care provider may recommend a prescription medicine to help prevent HIV infection. If you choose to take medicine to prevent HIV, you should first get tested for HIV. You should then be tested every 3  months for as long as you are taking the medicine. Pregnancy If you are about to stop having your period (premenopausal) and you may become pregnant, seek counseling before you get pregnant. Take 400 to 800 micrograms (mcg) of folic acid every day if you become pregnant. Ask for birth control (contraception) if you want to prevent pregnancy. Osteoporosis and menopause Osteoporosis is a disease in which the bones lose minerals and strength with aging. This can result in bone fractures. If you are 44 years old or older, or if you are at risk for osteoporosis and fractures, ask your health care provider if you should: Be screened for bone loss. Take a calcium or vitamin D supplement to lower your risk of fractures. Be given hormone replacement therapy (HRT) to treat symptoms of menopause. Follow these instructions at home: Alcohol use Do not drink alcohol if: Your health care provider tells you not to drink. You are pregnant, may be pregnant, or are planning to become pregnant. If you drink alcohol: Limit how much you have to: 0-1 drink a day. Know how much alcohol is in your  drink. In the [place] one drink equals one 12 oz bottle of beer (355 mL), one 5 oz glass of wine (148 mL), or one 1 oz glass of hard liquor (44 mL). Lifestyle Do not use any products that contain nicotine or tobacco. These products include cigarettes, chewing tobacco, and vaping devices, such as e-cigarettes. If you need help quitting, ask your health care provider. Do not use street drugs. Do not share needles. Ask your health care provider for help if you need support or information about quitting drugs. General instructions Schedule regular health, dental, and eye exams. Stay current with your vaccines. Tell your health care provider if: You often feel depressed. You have ever been abused or do not feel safe at home. Summary Adopting a healthy lifestyle and getting preventive care are important in promoting health  and wellness. Follow your health care provider's instructions about healthy diet, exercising, and getting tested or screened for diseases. Follow your health care provider's instructions on monitoring your cholesterol and blood pressure. This information is not intended to replace advice given to you by your health care provider. Make sure you discuss any questions you have with your health care provider. Document Revised: 11/01/2020 Document Reviewed: 11/01/2020 Elsevier Patient Education  [place].

## 2022-09-05 DIAGNOSIS — D485 Neoplasm of uncertain behavior of skin: Secondary | ICD-10-CM | POA: Diagnosis not present

## 2022-09-05 DIAGNOSIS — L218 Other seborrheic dermatitis: Secondary | ICD-10-CM | POA: Diagnosis not present

## 2022-09-05 DIAGNOSIS — L821 Other seborrheic keratosis: Secondary | ICD-10-CM | POA: Diagnosis not present

## 2022-09-05 DIAGNOSIS — Z129 Encounter for screening for malignant neoplasm, site unspecified: Secondary | ICD-10-CM | POA: Diagnosis not present

## 2022-09-05 DIAGNOSIS — L570 Actinic keratosis: Secondary | ICD-10-CM | POA: Diagnosis not present

## 2022-09-05 DIAGNOSIS — D3613 Benign neoplasm of peripheral nerves and autonomic nervous system of lower limb, including hip: Secondary | ICD-10-CM | POA: Diagnosis not present

## 2022-10-04 ENCOUNTER — Other Ambulatory Visit: Payer: Self-pay | Admitting: Family Medicine

## 2022-10-04 DIAGNOSIS — E039 Hypothyroidism, unspecified: Secondary | ICD-10-CM

## 2022-10-06 ENCOUNTER — Encounter: Payer: Self-pay | Admitting: Family Medicine

## 2022-10-06 ENCOUNTER — Ambulatory Visit (INDEPENDENT_AMBULATORY_CARE_PROVIDER_SITE_OTHER): Payer: Medicare Other | Admitting: Family Medicine

## 2022-10-06 VITALS — BP 129/72 | HR 81 | Ht 65.0 in | Wt 186.0 lb

## 2022-10-06 DIAGNOSIS — E785 Hyperlipidemia, unspecified: Secondary | ICD-10-CM

## 2022-10-06 DIAGNOSIS — R09A2 Foreign body sensation, throat: Secondary | ICD-10-CM | POA: Diagnosis not present

## 2022-10-06 DIAGNOSIS — E039 Hypothyroidism, unspecified: Secondary | ICD-10-CM | POA: Diagnosis not present

## 2022-10-06 DIAGNOSIS — I10 Essential (primary) hypertension: Secondary | ICD-10-CM

## 2022-10-06 NOTE — Assessment & Plan Note (Signed)
Due to recheck TSH.  She has had a little bit of weight gain and has felt more sluggish but she really thinks that is due to just her eating habits and wintertime.

## 2022-10-06 NOTE — Progress Notes (Signed)
Established Patient Office Visit  Subjective   Patient ID: Anne [name], female    DOB: [birth year]  Age: 72 y.o. MRN: [id number]  Chief Complaint  Patient presents with   Hypothyroidism   Hypertension    HPI  Hypertension- Pt denies chest pain, SOB, dizziness, or heart palpitations.  Taking meds as directed w/o problems.  Denies medication side effects.    Hypothyroidism - Taking medication regularly in the AM away from food and vitamins, etc. No recent change to skin, hair, or energy levels.  She has had a little bit of weight gain has felt a little bit more sluggish.  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  They are healing well she is been putting some ointment and hydroperoxide on them.  She still doing her physical therapy exercises at home for her low back and right hip and is doing well overall.  She did feel like it was really helpful.  She still having some issues with her throat and feeling like she wakes up with a thickness in the back of her throat.  She does not feel like she can travel to [place] and so would like to find an ENT here more locally.  Hyperlipidemia - tolerating stating well with no myalgias or significant side effects.  Lab Results  Component Value Date   CHOL 167 09/28/2020   HDL 48 (L) 09/28/2020   LDLCALC 96 09/28/2020   TRIG 133 09/28/2020   CHOLHDL 3.5 09/28/2020        ROS    Objective:     BP 129/72   Pulse 81   Ht 5\' 5"  (1.651 m)   Wt 186 lb (84.4 kg)   LMP 09/08/2000   SpO2 98%   BMI 30.95 kg/m    Physical Exam Vitals and nursing note reviewed.  Constitutional:      Appearance: She is well-developed.  HENT:     Head: Normocephalic and atraumatic.  Cardiovascular:     Rate and Rhythm: Normal rate and regular rhythm.     Heart sounds: Normal heart sounds.  Pulmonary:     Effort: Pulmonary effort is normal.     Breath sounds: Normal breath  sounds.  Skin:    General: Skin is warm and dry.  Neurological:     Mental Status: She is alert and oriented to person, place, and time.  Psychiatric:        Behavior: Behavior normal.      No results found for any visits on 10/06/22.    The 10-year ASCVD risk score (Arnett DK, et al., 2019) is: 18.9%    Assessment & Plan:   Problem List Items Addressed This Visit       Cardiovascular and Mediastinum   Essential hypertension - Primary    Well controlled. Continue current regimen. Follow up in  6 mo       Relevant Orders   Thyroid Panel With TSH   COMPLETE METABOLIC PANEL WITH GFR   Lipid Panel w/reflex Direct LDL     Endocrine   Hypothyroidism    Due to recheck TSH.  She has had a little bit of weight gain and has felt more sluggish but she really thinks that is due to just her eating habits and wintertime.      Relevant Orders   Thyroid Panel With TSH   COMPLETE METABOLIC PANEL WITH GFR   Lipid Panel w/reflex  Direct LDL     Other   Hyperlipidemia    Due to recheck lipids.       Globus sensation   Recommended [facility] which is here in town I am happy to send over referral to them if she would like.  Return in about 6 months (around 04/07/2023) for Hypertension, thyroid .    Nani Gasser, MD

## 2022-10-06 NOTE — Assessment & Plan Note (Signed)
Due to recheck lipids. 

## 2022-10-06 NOTE — Assessment & Plan Note (Signed)
Well controlled. Continue current regimen. Follow up in  6 mo  

## 2022-10-07 LAB — LIPID PANEL W/REFLEX DIRECT LDL
Cholesterol: 163 mg/dL (ref <200)
HDL: 49 mg/dL — ABNORMAL LOW (ref >=50)
LDL Cholesterol (Calc): 93 mg/dL (calc)
Non-HDL Cholesterol (Calc): 114 mg/dL (calc) (ref <130)
Total CHOL/HDL Ratio: 3.3 (calc) (ref <5.0)
Triglycerides: 116 mg/dL (ref <150)

## 2022-10-07 LAB — COMPLETE METABOLIC PANEL WITH GFR
AG Ratio: 1.5 (calc) (ref 1.0–2.5)
ALT: 14 U/L (ref 6–29)
AST: 18 U/L (ref 10–35)
Albumin: 4.3 g/dL (ref 3.6–5.1)
Alkaline phosphatase (APISO): 74 U/L (ref 37–153)
BUN: 16 mg/dL (ref 7–25)
CO2: 27 mmol/L (ref 20–32)
Calcium: 9.7 mg/dL (ref 8.6–10.4)
Chloride: 105 mmol/L (ref 98–110)
Creat: 0.92 mg/dL (ref 0.60–1.00)
Globulin: 2.8 g/dL (calc) (ref 1.9–3.7)
Glucose, Bld: 99 mg/dL (ref 65–99)
Potassium: 4.6 mmol/L (ref 3.5–5.3)
Sodium: 141 mmol/L (ref 135–146)
Total Bilirubin: 0.9 mg/dL (ref 0.2–1.2)
Total Protein: 7.1 g/dL (ref 6.1–8.1)
eGFR: 65 mL/min/{1.73_m2} (ref >=60)

## 2022-10-07 LAB — THYROID PANEL WITH TSH
Free Thyroxine Index: 2.3 (ref 1.4–3.8)
T3 Uptake: 28 % (ref 22–35)
T4, Total: 8.2 ug/dL (ref 5.1–11.9)
TSH: 1.72 mIU/L (ref 0.40–4.50)

## 2022-10-08 NOTE — Progress Notes (Signed)
Hi Anne [name], your cholesterol is OK. Thyroid and metabolic panels are good.

## 2022-10-30 ENCOUNTER — Encounter: Payer: Self-pay | Admitting: Cardiovascular Disease

## 2022-10-30 NOTE — Progress Notes (Unsigned)
Cardiology Office Note:    Date:  10/31/2022   ID:  Anne, Underhill 12-14-1950, MRN [id number]  PCP:  Agapito Games, MD  Cardiologist:  Kristeen Miss, MD  Electrophysiologist:  None   Referring MD: Agapito Games, *   Chief Complaint  Patient presents with   Atrial Fibrillation        Congestive Heart Failure          Previous notes    Anne [name] is a 72 y.o. female with a hx of paroxysmal atrial fibrillation, hypertension, hyperlipidemia, hypothyroidism with prior thyroid thyroidectomy.    I met her in March , 2019 in the hospital when she was admitted with rapid AFib. The Afib Spontaneously converted.  She had a mild troponin bump.     Her TSH was very low and it was thought that she may have been over replaced with her Synthroid. Sleep study in June, 2019 was negative for obstructive sleep apnea.  He was recently seen by Tereso Newcomer, PA in July for further evaluation of rapid atrial fibrillation.   CHADS2-VASc= 4 (age x1, female, HTN, aortic atherosclerosis).    Dec. 20, 2019 [name] is seen with husband, Anne [name]   No further episodes of Afib Uses a Kardia monitor.   Husband also has Afib  Has a hissing sound in her right ear ,  Has seen ENT.   June 03, 2019:   Anne [name] is seen today for follow-up of her paroxysmal atrial fibrillation and chronic diastolic congestive heart failure. No CP or dyspnea No significant palpitations Has remained in NSR - has PAF,  Remains on Eliquis   Feb. 1, 2022 Anne [name] is seen today for follow up of her PAF and chronic diastolic CHF Is very active.  Works in the yard.   No CP , no dyspnea. Anne [name] is wondering if she needs to continue taking the Eliquis - she  Has not had an episode of Afib since she had the brief episode while in the hospital with hyperthypoidism.  Her PAF was a single episode of   October 21, 2021:  Anne [name] seen today for follow-up of her paroxysmal atrial fibrillation and  chronic diastolic congestive heart failure Walks regularly  Does yard work regularly  [employer] back to the [employer] .    Oct 31, 2022 [name] is seen for follow up of her PAF, chronic diastolic CHF She was concerned about PAD at her last office visit  ( she saw our posters) Lower ext arterial doppler study did not reveal any evidence of PAD  Has not had any atrial fib in 4-5 years  Is getting her thyroid checked.  Has some palpitations at night .   On occasion wil take an extra 1/2 tab of metoprolol   We had stopped her eliquis 2 years ago ( at her request )  We had a long discussion about stroke risk She will add another CHADs2VAsc point next year ( age 86)   She agrees to restart the eliquis    Past Medical History:  Diagnosis Date   Acid reflux    Aortic atherosclerosis (HCC) 04/28/2013   CT 08/2017   Arthritis    "joints, hands" (08/30/2017)   Chronic lower back pain    Disc herniation    neck and low back   Family history of adverse reaction to anesthesia    "daughter w/PONV"   Follicular cystitis    Hiatal hernia    High cholesterol  Hyperlipidemia    ELEV CHOLESTEROL   Hypothyroidism    Osteopenia 2014   T score -1.6 FRAX 13%/1%   PAF (paroxysmal atrial fibrillation) (HCC) 08/30/2017   Echo 2/19: EF 55-60, normal wall motion, grade 1 diastolic dysfunction, mild PI, trivial TR //  Nuc 3/19: no ischemia or scar, EF 80 // CHADS2-VASc: 3 // Eliquis 5 bid    Schatzki's ring     Past Surgical History:  Procedure Laterality Date   BREAST BIOPSY Bilateral 1997   "excisional bx both benign"   BREAST CYST ASPIRATION Right ~ 1993   CATARACT EXTRACTION W/ INTRAOCULAR LENS IMPLANT Left 2011   CATARACT EXTRACTION W/ INTRAOCULAR LENS IMPLANT Right 12/20/2012   Dr. Areta Haber, OD   COLONOSCOPY WITH ESOPHAGOGASTRODUODENOSCOPY (EGD)  "several"   "? dilated esophagus" (08/30/2017)   DILATION AND CURETTAGE OF UTERUS  1996   EYE MUSCLE SURGERY Bilateral 1992   HERNIA  REPAIR     HYSTEROSCOPY     LAPAROSCOPIC CHOLECYSTECTOMY  09/28/2011   OOPHORECTOMY Right 04/2000   LAP RSO/LYSIS OF ADHESIONS   THYROIDECTOMY  1976   TUBAL LIGATION  1976   VARICOSE VEIN SURGERY Bilateral 1972   VENTRAL HERNIA REPAIR  06/23/13   Dr. Gennette Pac    Current Medications: Current Meds  Medication Sig   acetaminophen (TYLENOL) 325 MG tablet Take 650 mg by mouth every 6 (six) hours as needed.   apixaban (ELIQUIS) 5 MG TABS tablet Take 1 tablet (5 mg total) by mouth 2 (two) times daily.   CALCIUM PO Take 1 tablet by mouth daily.    Cholecalciferol (VITAMIN D) 2000 UNITS tablet Take 2,000 Units by mouth daily.   clobetasol (TEMOVATE) 0.05 % external solution Apply 1 application topically 2 (two) times daily.   estradiol (ESTRACE) 0.1 MG/GM vaginal cream Place 0.25 Applicatorfuls vaginally as needed. For vaginal irritation   meloxicam (MOBIC) 15 MG tablet TAKE 1 TABLET (15 MG TOTAL) BY MOUTH DAILY.   metoprolol succinate (TOPROL-XL) 50 MG 24 hr tablet TAKE 1 AND 1/2 TABS DAILY TO = 75 MG DAILY   Multiple Vitamin (MULTIVITAMIN) tablet Take 1 tablet by mouth daily.   omeprazole (PRILOSEC) 40 MG capsule TAKE 1 CAPSULE (40 MG TOTAL) BY MOUTH DAILY.   simvastatin (ZOCOR) 40 MG tablet TAKE 1 TABLET (40 MG TOTAL) BY MOUTH DAILY. NEEDS LABS   SYNTHROID 75 MCG tablet TAKE 1 TABLET BY MOUTH EVERY DAY BEFORE BREAKFAST     Allergies:   Amoxicillin-pot clavulanate, Ceftriaxone sodium, Cephalexin, Codeine, Molds & smuts, and Zofran [ondansetron hcl]   Social History   Socioeconomic History   Marital status: Married    Spouse name: [name]   Number of children: 1   Years of education: 10   Highest education level: GED or equivalent  Occupational History   Occupation: Quarry manager    Comment: retired  Tobacco Use   Smoking status: Former    Packs/day: 0.10    Years: 18.00    Additional pack years: 0.00    Total pack years: 1.80    Types: Cigarettes    Quit date: 01/27/1979     Years since quitting: 43.7    Passive exposure: Past   Smokeless tobacco: Never  Vaping Use   Vaping Use: Never used  Substance and Sexual Activity   Alcohol use: Yes    Alcohol/week: 1.0 standard drink of alcohol    Types: 1 Glasses of wine per week    Comment: 2 glasses of wine a month  Drug use: No   Sexual activity: Not Currently    Partners: Male    Birth control/protection: Post-menopausal  Other Topics Concern   Not on file  Social History Narrative   Tries to go the park to walk with her husband. Likes to [employer], day trips, and yard work.   Social Determinants of Health   Financial Resource Strain: Low Risk  (08/29/2022)   Overall Financial Resource Strain (CARDIA)    Difficulty of Paying Living Expenses: Not hard at all  Food Insecurity: No Food Insecurity (08/29/2022)   Hunger Vital Sign    Worried About Running Out of Food in the Last Year: Never true    Ran Out of Food in the Last Year: Never true  Transportation Needs: No Transportation Needs (08/29/2022)   PRAPARE - Administrator, Civil Service (Medical): No    Lack of Transportation (Non-Medical): No  Physical Activity: Insufficiently Active (08/29/2022)   Exercise Vital Sign    Days of Exercise per Week: 3 days    Minutes of Exercise per Session: 20 min  Stress: No Stress Concern Present (08/29/2022)   [employer] of Occupational Health - Occupational Stress Questionnaire    Feeling of Stress : Not at all  Social Connections: Moderately Isolated (08/29/2022)   Social Connection and Isolation Panel [NHANES]    Frequency of Communication with Friends and Family: More than three times a week    Frequency of Social Gatherings with Friends and Family: Twice a week    Attends Religious Services: Never    Diplomatic Services operational officer: No    Attends Engineer, structural: Never    Marital Status: Married     Family History: The patient's family history includes Anuerysm in her father;  Arthritis in her daughter and sister; Breast cancer in her sister; Cancer in her sister; Cancer (age of onset: 65) in her brother; Heart attack (age of onset: 61) in her father; Heart disease in her father; Hyperparathyroidism in her daughter; Hypertension in her mother and sister; Leukemia in her brother; Lupus in her sister; Melanoma in her brother; Prostate cancer in her brother; Pulmonary fibrosis in her brother; Rheum arthritis in her sister; Stroke in her sister and sister.  ROS:   Please see the history of present illness.     All other systems reviewed and are negative.  EKGs/Labs/Other Studies Reviewed:    The following studies were reviewed today:   EKG:      Recent Labs: 10/06/2022: ALT 14; BUN 16; Creat 0.92; Potassium 4.6; Sodium 141; TSH 1.72  Recent Lipid Panel    Component Value Date/Time   CHOL 163 10/06/2022 1407   TRIG 116 10/06/2022 1407   HDL 49 (L) 10/06/2022 1407   CHOLHDL 3.3 10/06/2022 1407   VLDL 20 08/31/2017 0233   LDLCALC 93 10/06/2022 1407    Physical Exam:    Physical Exam: Blood pressure 128/76, pulse 70, height 5\' 5"  (1.651 m), weight 188 lb 12.8 oz (85.6 kg), last menstrual period 09/08/2000, SpO2 98 %.       GEN:  Well nourished, well developed in no acute distress HEENT: Normal NECK: No JVD; No carotid bruits LYMPHATICS: No lymphadenopathy CARDIAC: RRR , no murmurs, rubs, gallops RESPIRATORY:  Clear to auscultation without rales, wheezing or rhonchi  ABDOMEN: Soft, non-tender, non-distended MUSCULOSKELETAL:  No edema; No deformity  SKIN: Warm and dry NEUROLOGIC:  Alert and oriented x 3     EKG: Oct 31, 2022:  Normal sinus rhythm at 70.  Left anterior fascicular block.  Minimal voltage criteria for left ventricular hypertrophy.  ASSESSMENT:    1. PAF (paroxysmal atrial fibrillation) (HCC)      PLAN:    :   Paroxysmal Atrial fib:      We had stopped her eliquis 2 years ago ( at her request )  We had a long discussion about  stroke risk She will add another CHADs2VAsc point next year ( age 43)   She agrees to restart the eliquis     2.  Hypothyroidism:   on synthroid    3.   Fatigue:     4.  Leg pain :       Medication Adjustments/Labs and Tests Ordered: Current medicines are reviewed at length with the patient today.  Concerns regarding medicines are outlined above.  Orders Placed This Encounter  Procedures   EKG 12-Lead   Meds ordered this encounter  Medications   apixaban (ELIQUIS) 5 MG TABS tablet    Sig: Take 1 tablet (5 mg total) by mouth 2 (two) times daily.    Dispense:  180 tablet    Refill:  3     Patient Instructions  Medication Instructions:  RE-START Eliquis 5mg  twice daily *If you need a refill on your cardiac medications before your next appointment, please call your pharmacy*   Lab Work: NONE If you have labs (blood work) drawn today and your tests are completely normal, you will receive your results only by: MyChart Message (if you have MyChart) OR A paper copy in the mail If you have any lab test that is abnormal or we need to change your treatment, we will call you to review the results.   Testing/Procedures: NONE   Follow-Up: At [facility], you and your health needs are our priority.  As part of our continuing mission to provide you with exceptional heart care, we have created designated Provider Care Teams.  These Care Teams include your primary Cardiologist (physician) and Advanced Practice Providers (APPs -  Physician Assistants and Nurse Practitioners) who all work together to provide you with the care you need, when you need it.  We recommend signing up for the patient portal called "MyChart".  Sign up information is provided on this After Visit Summary.  MyChart is used to connect with patients for Virtual Visits (Telemedicine).  Patients are able to view lab/test results, encounter notes, upcoming appointments, etc.  Non-urgent messages can be sent  to your provider as well.   To learn more about what you can do with MyChart, go to [email or web address].    Your next appointment:   1 year(s)  Provider:   Kristeen Miss, MD        Signed, Kristeen Miss, MD  10/31/2022 6:05 PM    [facility]

## 2022-10-31 ENCOUNTER — Ambulatory Visit: Payer: Medicare Other | Attending: Cardiovascular Disease | Admitting: Cardiovascular Disease

## 2022-10-31 ENCOUNTER — Encounter: Payer: Self-pay | Admitting: Cardiovascular Disease

## 2022-10-31 VITALS — BP 128/76 | HR 70 | Ht 65.0 in | Wt 188.8 lb

## 2022-10-31 DIAGNOSIS — I480 Paroxysmal atrial fibrillation: Secondary | ICD-10-CM | POA: Insufficient documentation

## 2022-10-31 MED ORDER — APIXABAN 5 MG PO TABS
5.0000 mg | ORAL_TABLET | Freq: Two times a day (BID) | ORAL | 3 refills | Status: DC
Start: 2022-10-31 — End: 2022-11-06

## 2022-10-31 NOTE — Patient Instructions (Signed)
Medication Instructions:  RE-START Eliquis 5mg  twice daily *If you need a refill on your cardiac medications before your next appointment, please call your pharmacy*   Lab Work: NONE If you have labs (blood work) drawn today and your tests are completely normal, you will receive your results only by: MyChart Message (if you have MyChart) OR A paper copy in the mail If you have any lab test that is abnormal or we need to change your treatment, we will call you to review the results.   Testing/Procedures: NONE   Follow-Up: At [facility], you and your health needs are our priority.  As part of our continuing mission to provide you with exceptional heart care, we have created designated Provider Care Teams.  These Care Teams include your primary Cardiologist (physician) and Advanced Practice Providers (APPs -  Physician Assistants and Nurse Practitioners) who all work together to provide you with the care you need, when you need it.  We recommend signing up for the patient portal called "MyChart".  Sign up information is provided on this After Visit Summary.  MyChart is used to connect with patients for Virtual Visits (Telemedicine).  Patients are able to view lab/test results, encounter notes, upcoming appointments, etc.  Non-urgent messages can be sent to your provider as well.   To learn more about what you can do with MyChart, go to [email or web address].    Your next appointment:   1 year(s)  Provider:   Kristeen Miss, MD

## 2022-11-02 ENCOUNTER — Telehealth: Payer: Self-pay | Admitting: Cardiovascular Disease

## 2022-11-02 NOTE — Telephone Encounter (Signed)
Pt c/o medication issue:  1. Name of Medication: apixaban (ELIQUIS) 5 MG TABS tablet   2. How are you currently taking this medication (dosage and times per day)? Not currently taking  3. Are you having a reaction (difficulty breathing--STAT)? No   4. What is your medication issue? Patient states she discussed getting back on this medication with Dr. Elease Hashimoto at her last appt. She has decided she does not wish to get back on it due to how bad it previously made her feel.

## 2022-11-06 NOTE — Telephone Encounter (Signed)
Per OV note on 10/31/22:  Paroxysmal Atrial fib:       We had stopped her eliquis 2 years ago ( at her request )  We had a long discussion about stroke risk She will add another CHADs2VAsc point next year ( age 72)    She agrees to restart the eliquis   Medication removed from [facility] at this time. Routing to MD just to make him aware. Nothing further to do.

## 2022-12-26 ENCOUNTER — Other Ambulatory Visit: Payer: Self-pay | Admitting: Family Medicine

## 2022-12-30 ENCOUNTER — Other Ambulatory Visit: Payer: Self-pay | Admitting: Family Medicine

## 2023-01-18 DIAGNOSIS — Z961 Presence of intraocular lens: Secondary | ICD-10-CM | POA: Diagnosis not present

## 2023-01-18 DIAGNOSIS — H524 Presbyopia: Secondary | ICD-10-CM | POA: Diagnosis not present

## 2023-01-18 DIAGNOSIS — H52223 Regular astigmatism, bilateral: Secondary | ICD-10-CM | POA: Diagnosis not present

## 2023-02-14 ENCOUNTER — Other Ambulatory Visit: Payer: Self-pay | Admitting: Family Medicine

## 2023-02-14 DIAGNOSIS — Z1231 Encounter for screening mammogram for malignant neoplasm of breast: Secondary | ICD-10-CM

## 2023-03-06 NOTE — Progress Notes (Signed)
Office Visit Note  Patient: Anne [name]             Date of Birth: [birth year]           MRN: [id number]             PCP: Agapito Games, MD Referring: Agapito Games, * Visit Date: 03/15/2023 Occupation: @GUAROCC @  Subjective:  Pain in multiple joints  History of Present Illness: Anne [name] is a 72 y.o. female with osteoarthritis, degenerative disc disease, history of anti-CCP and positive ANA.  She states she continues to have some discomfort in her hands, knee joints, neck and lower back.  She states the pain is tolerable.  The trigger fingers have resolved.  She denies any history of increased joint swelling.    Activities of Daily Living:  Patient reports morning stiffness for 15-20 minutes.   Patient Denies nocturnal pain.  Difficulty dressing/grooming: Denies Difficulty climbing stairs: Reports Difficulty getting out of chair: Denies Difficulty using hands for taps, buttons, cutlery, and/or writing: Denies  Review of Systems  Constitutional:  Negative for fatigue.  HENT:  Negative for mouth sores and mouth dryness.   Eyes:  Negative for dryness.  Respiratory:  Negative for shortness of breath.   Cardiovascular:  Negative for chest pain and palpitations.  Gastrointestinal:  Negative for blood in stool, constipation and diarrhea.  Endocrine: Negative for increased urination.  Genitourinary:  Negative for involuntary urination.  Musculoskeletal:  Positive for joint pain, joint pain, joint swelling, myalgias, morning stiffness and myalgias. Negative for gait problem, muscle weakness and muscle tenderness.  Skin:  Positive for sensitivity to sunlight. Negative for color change, rash and hair loss.  Allergic/Immunologic: Negative for susceptible to infections.  Neurological:  Negative for dizziness and headaches.  Hematological:  Negative for swollen glands.  Psychiatric/Behavioral:  Negative for depressed mood and sleep disturbance. The  patient is not nervous/anxious.     PMFS History:  Patient Active Problem List   Diagnosis Date Noted   Globus sensation 03/29/2021   Dry mouth 03/29/2020   Seborrheic dermatitis of scalp 01/22/2020   Cervical radiculopathy 08/25/2019   History of esophagogastroduodenoscopy (EGD) 02/08/2018   Colon cancer screening 02/08/2018   Essential hypertension 12/25/2017   GERD (gastroesophageal reflux disease) 08/30/2017   Chronic diastolic (congestive) heart failure (HCC) 08/30/2017   History of atrial fibrillation 08/30/2017   Flushing 07/30/2017   Paresthesia of both hands 07/30/2017   Postsurgical hypothyroidism 07/30/2017   Aortic atherosclerosis (HCC) 04/28/2013   S/P cataract extraction 01/16/2013   Pseudophakia of both eyes 12/20/2012   Schatzki's ring 10/16/2012   Spinal stenosis of lumbar region 08/16/2012   Vaginal atrophy 11/14/2011   Graves' disease 05/05/2011   Disc herniation    Osteopenia    PERSONAL HISTORY OF ALLERGY TO LATEX 12/23/2009   UNSPECIFIED DISEASE OF PHARYNX 12/16/2008   Intermittent palpitations 11/10/2008   Hypothyroidism 09/22/2008   Hyperlipidemia 09/22/2008   ALLERGIC RHINITIS 09/22/2008    Past Medical History:  Diagnosis Date   Acid reflux    Aortic atherosclerosis (HCC) 04/28/2013   CT 08/2017   Arthritis    "joints, hands" (08/30/2017)   Chronic lower back pain    Disc herniation    neck and low back   Family history of adverse reaction to anesthesia    "daughter w/PONV"   Follicular cystitis    Hiatal hernia    High cholesterol    Hyperlipidemia    ELEV CHOLESTEROL   Hypothyroidism  Osteopenia 2014   T score -1.6 FRAX 13%/1%   PAF (paroxysmal atrial fibrillation) (HCC) 08/30/2017   Echo 2/19: EF 55-60, normal wall motion, grade 1 diastolic dysfunction, mild PI, trivial TR //  Nuc 3/19: no ischemia or scar, EF 80 // CHADS2-VASc: 3 // Eliquis 5 bid    Schatzki's ring     Family History  Problem Relation Age of Onset   Hypertension  Mother    Heart attack Father 35   Heart disease Father    Anuerysm Father    Stroke Sister    Hypertension Sister    Arthritis Sister    Lupus Sister    Cancer Sister    Breast cancer Sister        Age 58   Rheum arthritis Sister    Stroke Sister    Cancer Brother 73       melanoma   Melanoma Brother    Prostate cancer Brother    Leukemia Brother    Pulmonary fibrosis Brother    Arthritis Daughter        Rheumatoid   Hyperparathyroidism Daughter    Past Surgical History:  Procedure Laterality Date   BREAST BIOPSY Bilateral 1997   "excisional bx both benign"   BREAST CYST ASPIRATION Right ~ 1993   CATARACT EXTRACTION W/ INTRAOCULAR LENS IMPLANT Left 2011   CATARACT EXTRACTION W/ INTRAOCULAR LENS IMPLANT Right 12/20/2012   Dr. Areta Haber, OD   COLONOSCOPY WITH ESOPHAGOGASTRODUODENOSCOPY (EGD)  "several"   "? dilated esophagus" (08/30/2017)   DILATION AND CURETTAGE OF UTERUS  1996   EYE MUSCLE SURGERY Bilateral 1992   HERNIA REPAIR     HYSTEROSCOPY     LAPAROSCOPIC CHOLECYSTECTOMY  09/28/2011   OOPHORECTOMY Right 04/2000   LAP RSO/LYSIS OF ADHESIONS   THYROIDECTOMY  1976   TUBAL LIGATION  1976   VARICOSE VEIN SURGERY Bilateral 1972   VENTRAL HERNIA REPAIR  06/23/13   Dr. Gennette Pac   Social History   Social History Narrative   Tries to go the park to walk with her husband. Likes to [employer], day trips, and yard work.   Immunization History  Administered Date(s) Administered   Influenza Split 06/02/2011, 05/03/2012   Influenza Whole 04/16/2009   Influenza,inj,Quad PF,6+ Mos 05/01/2013, 04/17/2014   Pneumococcal Conjugate-13 10/17/2013   Pneumococcal Polysaccharide-23 02/04/2016   Td 01/21/2010   Zoster, Live 08/18/2011     Objective: Vital Signs: BP 120/76 (BP Location: Left Arm, Patient Position: Sitting, Cuff Size: Normal)   Pulse 69   Resp 15   Ht 5\' 5"  (1.651 m)   Wt 183 lb 9.6 oz (83.3 kg)   LMP 09/08/2000   BMI 30.55 kg/m    Physical  Exam Vitals and nursing note reviewed.  Constitutional:      Appearance: She is well-developed.  HENT:     Head: Normocephalic and atraumatic.  Eyes:     Conjunctiva/sclera: Conjunctivae normal.  Cardiovascular:     Rate and Rhythm: Normal rate and regular rhythm.     Heart sounds: Normal heart sounds.  Pulmonary:     Effort: Pulmonary effort is normal.     Breath sounds: Normal breath sounds.  Abdominal:     General: Bowel sounds are normal.     Palpations: Abdomen is soft.  Musculoskeletal:     Cervical back: Normal range of motion.  Lymphadenopathy:     Cervical: No cervical adenopathy.  Skin:    General: Skin is warm and dry.  Capillary Refill: Capillary refill takes less than 2 seconds.  Neurological:     Mental Status: She is alert and oriented to person, place, and time.  Psychiatric:        Behavior: Behavior normal.      Musculoskeletal Exam: Cervical spine was in good range of motion.  Shoulder joints, elbow joints, wrist joints, MCPs PIPs and DIPs Juengel range of motion.  Bilateral PIP and DIP thickening was noted.  Hip joints and knee joints were in good range of motion.  She had no warmth or swelling on the examination.  There was no tenderness over ankles or MTPs.  CDAI Exam: CDAI Score: -- Patient Global: --; Provider Global: -- Swollen: --; Tender: -- Joint Exam 03/15/2023   No joint exam has been documented for this visit   There is currently no information documented on the homunculus. Go to the Rheumatology activity and complete the homunculus joint exam.  Investigation: No additional findings.  Imaging: No results found.  Recent Labs: Lab Results  Component Value Date   WBC 5.3 09/28/2020   HGB 14.0 09/28/2020   PLT 182 09/28/2020   NA 141 10/06/2022   K 4.6 10/06/2022   CL 105 10/06/2022   CO2 27 10/06/2022   GLUCOSE 99 10/06/2022   BUN 16 10/06/2022   CREATININE 0.92 10/06/2022   BILITOT 0.9 10/06/2022   ALKPHOS 81 08/30/2017    AST 18 10/06/2022   ALT 14 10/06/2022   PROT 7.1 10/06/2022   ALBUMIN 4.2 08/30/2017   CALCIUM 9.7 10/06/2022   GFRAA 70 09/28/2020    Speciality Comments: No specialty comments available.  Procedures:  No procedures performed Allergies: Amoxicillin-pot clavulanate, Ceftriaxone sodium, Cephalexin, Codeine, Molds & smuts, and Zofran [ondansetron hcl]   Assessment / Plan:     Visit Diagnoses: Positive anti-CCP test - ANA 1:80 cytoplasmic, 1:40 NH, RF negative, positive family history of rheumatoid arthritis: Patient had no synovitis on examination today.  Primary osteoarthritis of both hands-she gives history of pain and discomfort in her bilateral hands.  DIP and PIP thickening with no synovitis was noted.  Joint protection and muscle strengthening was discussed.  Patient states she has been doing exercises at home.  Pain in right hip -she had good range of motion of her right hip joint without discomfort today.  X-rays of the hip joint obtained in the past were unremarkable.  Primary osteoarthritis of both knees -she has intermittent discomfort in her knee joints.  No warmth swelling or effusion was noted.  Previous x-rays showed bilateral mild osteoarthritis and moderate chondromalacia patella:  Trigger finger, right middle finger-resolved.  Trigger finger, left ring finger-resolved.  DDD (degenerative disc disease), cervical-she good range of motion without discomfort today.  Chronic right-sided low back pain with right-sided sciatica -she has intermittent discomfort in her lumbar spine.  She had good mobility in her lumbar spine today.  Core strength exercises were emphasized.  X-ray showed severe levoscoliosis, multilevel spondylosis L4-L5 listhesis and facet joint arthropathy.  Osteopenia of multiple sites - DEXA 10/12/2021 : The BMD measured at Forearm Radius 33% is 0.682 g/cm2 with a T-scoreof -2.2.  Patient will get repeat DEXA scan with the PCP in 2025.  Calcium rich diet,  vitamin D and regular exercise was emphasized.  Other medical problems are listed as follows:  Chronic diastolic (congestive) heart failure (HCC)  Paroxysmal atrial fibrillation (HCC)  History of hyperlipidemia  Essential hypertension  Aortic atherosclerosis (HCC)  History of gastroesophageal reflux (GERD)  Postsurgical hypothyroidism  Family history of rheumatoid arthritis  Former smoker  Orders: No orders of the defined types were placed in this encounter.  No orders of the defined types were placed in this encounter.    Follow-Up Instructions: Return in about 1 year (around 03/14/2024) for Osteoarthritis.   Pollyann Savoy, MD  Note - This record has been created using Animal nutritionist.  Chart creation errors have been sought, but may not always  have been located. Such creation errors do not reflect on  the standard of medical care.

## 2023-03-15 ENCOUNTER — Ambulatory Visit: Payer: Medicare Other | Attending: Rheumatology | Admitting: Rheumatology

## 2023-03-15 ENCOUNTER — Encounter: Payer: Self-pay | Admitting: Rheumatology

## 2023-03-15 VITALS — BP 120/76 | HR 69 | Resp 15 | Ht 65.0 in | Wt 183.6 lb

## 2023-03-15 DIAGNOSIS — M170 Bilateral primary osteoarthritis of knee: Secondary | ICD-10-CM

## 2023-03-15 DIAGNOSIS — E890 Postprocedural hypothyroidism: Secondary | ICD-10-CM | POA: Diagnosis not present

## 2023-03-15 DIAGNOSIS — M65342 Trigger finger, left ring finger: Secondary | ICD-10-CM

## 2023-03-15 DIAGNOSIS — Z8261 Family history of arthritis: Secondary | ICD-10-CM

## 2023-03-15 DIAGNOSIS — M19042 Primary osteoarthritis, left hand: Secondary | ICD-10-CM

## 2023-03-15 DIAGNOSIS — M19041 Primary osteoarthritis, right hand: Secondary | ICD-10-CM

## 2023-03-15 DIAGNOSIS — R768 Other specified abnormal immunological findings in serum: Secondary | ICD-10-CM | POA: Diagnosis present

## 2023-03-15 DIAGNOSIS — G8929 Other chronic pain: Secondary | ICD-10-CM | POA: Diagnosis not present

## 2023-03-15 DIAGNOSIS — I700 Atherosclerosis of aorta: Secondary | ICD-10-CM | POA: Diagnosis not present

## 2023-03-15 DIAGNOSIS — Z8719 Personal history of other diseases of the digestive system: Secondary | ICD-10-CM

## 2023-03-15 DIAGNOSIS — Z87891 Personal history of nicotine dependence: Secondary | ICD-10-CM | POA: Diagnosis not present

## 2023-03-15 DIAGNOSIS — M5030 Other cervical disc degeneration, unspecified cervical region: Secondary | ICD-10-CM | POA: Diagnosis present

## 2023-03-15 DIAGNOSIS — I10 Essential (primary) hypertension: Secondary | ICD-10-CM

## 2023-03-15 DIAGNOSIS — M65331 Trigger finger, right middle finger: Secondary | ICD-10-CM

## 2023-03-15 DIAGNOSIS — M8589 Other specified disorders of bone density and structure, multiple sites: Secondary | ICD-10-CM | POA: Diagnosis present

## 2023-03-15 DIAGNOSIS — Z8639 Personal history of other endocrine, nutritional and metabolic disease: Secondary | ICD-10-CM

## 2023-03-15 DIAGNOSIS — I5032 Chronic diastolic (congestive) heart failure: Secondary | ICD-10-CM

## 2023-03-15 DIAGNOSIS — M5441 Lumbago with sciatica, right side: Secondary | ICD-10-CM

## 2023-03-15 DIAGNOSIS — M25551 Pain in right hip: Secondary | ICD-10-CM | POA: Diagnosis present

## 2023-03-15 DIAGNOSIS — I480 Paroxysmal atrial fibrillation: Secondary | ICD-10-CM | POA: Diagnosis present

## 2023-03-20 ENCOUNTER — Telehealth: Payer: Self-pay | Admitting: Family Medicine

## 2023-03-20 NOTE — Telephone Encounter (Signed)
Patient called requesting a refill of ; prednisone for hip inflammation.  Pharmacy ;   [facility] [phone] - [place], [place] - [address]

## 2023-03-21 NOTE — Telephone Encounter (Signed)
Patient is waiting until her appt on 10/15.

## 2023-03-21 NOTE — Telephone Encounter (Signed)
Pt will either need to wait until she is seen on 10/15 or try to schedule a same day/acute for her hip pain. This is not a medication that can be sent in without her being seen.

## 2023-03-29 ENCOUNTER — Other Ambulatory Visit: Payer: Self-pay | Admitting: Family Medicine

## 2023-04-01 ENCOUNTER — Other Ambulatory Visit: Payer: Self-pay | Admitting: Cardiovascular Disease

## 2023-04-01 ENCOUNTER — Other Ambulatory Visit: Payer: Self-pay | Admitting: Family Medicine

## 2023-04-01 DIAGNOSIS — E039 Hypothyroidism, unspecified: Secondary | ICD-10-CM

## 2023-04-02 ENCOUNTER — Ambulatory Visit
Admission: RE | Admit: 2023-04-02 | Discharge: 2023-04-02 | Disposition: A | Discharge status: Home / Self Care | Payer: Medicare Other | Source: Ambulatory Visit | Attending: Family Medicine | Admitting: Family Medicine

## 2023-04-02 DIAGNOSIS — Z1231 Encounter for screening mammogram for malignant neoplasm of breast: Secondary | ICD-10-CM

## 2023-04-04 NOTE — Progress Notes (Signed)
Please call patient. Normal mammogram.  Repeat in 1 year.  

## 2023-04-09 ENCOUNTER — Ambulatory Visit: Payer: Medicare Other | Admitting: Family Medicine

## 2023-04-10 ENCOUNTER — Ambulatory Visit (INDEPENDENT_AMBULATORY_CARE_PROVIDER_SITE_OTHER): Payer: Medicare Other | Admitting: Family Medicine

## 2023-04-10 ENCOUNTER — Encounter: Payer: Self-pay | Admitting: Family Medicine

## 2023-04-10 VITALS — BP 128/74 | HR 81 | Ht 65.0 in | Wt 184.0 lb

## 2023-04-10 DIAGNOSIS — I878 Other specified disorders of veins: Secondary | ICD-10-CM | POA: Diagnosis not present

## 2023-04-10 DIAGNOSIS — E039 Hypothyroidism, unspecified: Secondary | ICD-10-CM

## 2023-04-10 DIAGNOSIS — W57XXXA Bitten or stung by nonvenomous insect and other nonvenomous arthropods, initial encounter: Secondary | ICD-10-CM | POA: Diagnosis not present

## 2023-04-10 DIAGNOSIS — I10 Essential (primary) hypertension: Secondary | ICD-10-CM

## 2023-04-10 DIAGNOSIS — E785 Hyperlipidemia, unspecified: Secondary | ICD-10-CM | POA: Diagnosis not present

## 2023-04-10 DIAGNOSIS — M25551 Pain in right hip: Secondary | ICD-10-CM | POA: Diagnosis not present

## 2023-04-10 MED ORDER — PREDNISONE 20 MG PO TABS: 40.0000 mg | ORAL_TABLET | Freq: Every day | ORAL | 0 refills | Status: DC

## 2023-04-10 NOTE — Assessment & Plan Note (Signed)
Due to recheck TSH.

## 2023-04-10 NOTE — Patient Instructions (Signed)
Recommend compression stocking around 20 grade pressure.

## 2023-04-10 NOTE — Progress Notes (Signed)
Established Patient Office Visit  Subjective   Patient ID: Anne [name], female    DOB: June 04, 1951  Age: 72 y.o. MRN: [id number]  Chief Complaint  Patient presents with   Hypertension    HPI  Hypertension- Pt denies chest pain, SOB, dizziness, or heart palpitations.  Taking meds as directed w/o problems.  Denies medication side effects.     Would like a refill on vaginal estrogen.normally gets from Dr. Audie Box,    Still having some swelling in her right ankle.  Occ still swelling and when swells it sometimes itcyhes.notices the skin looks darker.    Hypothyroidism - Taking medication regularly in the AM away from food and vitamins, etc. No recent change to skin, hair, or energy levels.  Just feels really tired bedtime.  She feels like most of the time she does rest well.  Also has some insect bites she wants me to look at she was sitting out on her deck and just her bathroom and got several bites around her ankles, right wrist and right breast area she says they are itchy and she has been using Benadryl cream on them.  She is also still having a lot of problems with her right hip.  In the past we had given her a round of prednisone and she found it really helpful.  The pain, radiates down the side and outer part of her hip she would like to have another round of prednisone of possible.     ROS    Objective:     BP 128/74   Pulse 81   Ht 5\' 5"  (1.651 m)   Wt 184 lb (83.5 kg)   LMP 09/08/2000   SpO2 99%   BMI 30.62 kg/m    Physical Exam Vitals and nursing note reviewed.  Constitutional:      Appearance: Normal appearance.  HENT:     Head: Normocephalic and atraumatic.  Eyes:     Conjunctiva/sclera: Conjunctivae normal.  Cardiovascular:     Rate and Rhythm: Normal rate and regular rhythm.  Pulmonary:     Effort: Pulmonary effort is normal.     Breath sounds: Normal breath sounds.  Skin:    General: Skin is warm and dry.  Neurological:     Mental  Status: She is alert.  Psychiatric:        Mood and Affect: Mood normal.      No results found for any visits on 04/10/23.    The 10-year ASCVD risk score (Arnett DK, et al., 2019) is: 18.6%    Assessment & Plan:   Problem List Items Addressed This Visit       Cardiovascular and Mediastinum   Essential hypertension - Primary    Well controlled. Continue current regimen. Follow up in  6 mo       Relevant Orders   CMP14+EGFR   CBC   TSH     Endocrine   Hypothyroidism    Due to recheck TSH.      Relevant Orders   CMP14+EGFR   CBC   TSH     Other   Venous stasis    The lower extremity swelling and veins are most consistent with venous stasis.  Recommend compression stockings with a pressure around 20 this will help with the swelling irritation itching discomfort and skin color change.      Hyperlipidemia    Due to recheck liver function.      Relevant Orders   CMP14+EGFR  CBC   TSH   Other Visit Diagnoses     Insect bite, unspecified site, initial encounter       Right hip pain          Insect bites-okay to continue using Benadryl or hydrocortisone if not healing after the weekend then please let us know.  Right hip pain -will send over a round of prednisone.  But if she is not feeling better after the weekend then please let us know and we can do further workup or consider formal physical therapy.  Could also be coming from her back.  Return in about 6 months (around 10/09/2023) for Hypertension, thyroid.    Nani Gasser, MD

## 2023-04-10 NOTE — Assessment & Plan Note (Signed)
The lower extremity swelling and veins are most consistent with venous stasis.  Recommend compression stockings with a pressure around 20 this will help with the swelling irritation itching discomfort and skin color change.

## 2023-04-10 NOTE — Progress Notes (Signed)
Pt has several places that she wants Dr. Linford Arnold to check that she believes that are mosquito bites she would like to be looked at. On her R wrist,R ankle,foot and chest area.  She also stated that she is still having issues with her L ankle that sometimes swells and itches. She has had dopplers done and wanted to know if Dr. Linford Arnold thought that she should f/u with Rheumatology for this.

## 2023-04-10 NOTE — Assessment & Plan Note (Signed)
Due to recheck liver function.

## 2023-04-10 NOTE — Assessment & Plan Note (Signed)
Well controlled. Continue current regimen. Follow up in  6 mo  

## 2023-04-11 LAB — CMP14+EGFR
ALT: 16 [IU]/L (ref 0–32)
AST: 20 [IU]/L (ref 0–40)
Albumin: 4.6 g/dL (ref 3.8–4.8)
Alkaline Phosphatase: 93 [IU]/L (ref 44–121)
BUN/Creatinine Ratio: 15 (ref 12–28)
BUN: 14 mg/dL (ref 8–27)
Bilirubin Total: 0.8 mg/dL (ref 0.0–1.2)
CO2: 24 mmol/L (ref 20–29)
Calcium: 10 mg/dL (ref 8.7–10.3)
Chloride: 102 mmol/L (ref 96–106)
Creatinine, Ser: 0.92 mg/dL (ref 0.57–1.00)
Globulin, Total: 2.7 g/dL (ref 1.5–4.5)
Glucose: 98 mg/dL (ref 70–99)
Potassium: 4.1 mmol/L (ref 3.5–5.2)
Sodium: 140 mmol/L (ref 134–144)
Total Protein: 7.3 g/dL (ref 6.0–8.5)
eGFR: 65 mL/min/{1.73_m2} (ref >59)

## 2023-04-11 LAB — CBC
Hematocrit: 43.1 % (ref 34.0–46.6)
Hemoglobin: 14.4 g/dL (ref 11.1–15.9)
MCH: 28.8 pg (ref 26.6–33.0)
MCHC: 33.4 g/dL (ref 31.5–35.7)
MCV: 86 fL (ref 79–97)
Platelets: 214 10*3/uL (ref 150–450)
RBC: 5 x10E6/uL (ref 3.77–5.28)
RDW: 13.8 % (ref 11.7–15.4)
WBC: 6 10*3/uL (ref 3.4–10.8)

## 2023-04-11 LAB — TSH: TSH: 2.1 u[IU]/mL (ref 0.450–4.500)

## 2023-04-11 NOTE — Progress Notes (Signed)
Hi Anne [name], your blood count is normal.  Thyroid looks great.  Metabolic panel looks good as well.

## 2023-04-12 ENCOUNTER — Other Ambulatory Visit: Payer: Self-pay | Admitting: Family Medicine

## 2023-06-23 ENCOUNTER — Other Ambulatory Visit: Payer: Self-pay | Admitting: Family Medicine

## 2023-07-04 ENCOUNTER — Ambulatory Visit (INDEPENDENT_AMBULATORY_CARE_PROVIDER_SITE_OTHER): Payer: Medicare Other | Admitting: Family Medicine

## 2023-07-04 VITALS — BP 128/58 | HR 78 | Ht 65.0 in | Wt 180.0 lb

## 2023-07-04 DIAGNOSIS — J019 Acute sinusitis, unspecified: Secondary | ICD-10-CM

## 2023-07-04 MED ORDER — FLUTICASONE PROPIONATE 50 MCG/ACT NA SUSP: 2.0000 | Freq: Every day | NASAL | 6 refills | Status: DC

## 2023-07-04 MED ORDER — OMEPRAZOLE 40 MG PO CPDR: 40.0000 mg | DELAYED_RELEASE_CAPSULE | Freq: Every day | ORAL | 1 refills | Status: DC

## 2023-07-04 NOTE — Progress Notes (Signed)
   Acute Office Visit  Subjective:     Patient ID: Anne [name], female    DOB: 1951-03-12, 73 y.o.   MRN: [id number]  Chief Complaint  Patient presents with   Sinusitis   Cough    HPI Patient is in today for sinus congestion and mucous x 2 days. Started with a scratchy throat. No chest congestion or SOB.  No fever or chills.  She says she is getting some green mucus she did want to let me know that her left eye was a little matted about a week and a half ago and woke up again with that a little bit matted this morning.  Has been using some Tylenol .  ROS      Objective:    BP (!) 128/58   Pulse 78   Ht 5' 5 (1.651 m)   Wt 180 lb (81.6 kg)   LMP 09/08/2000   SpO2 98%   BMI 29.95 kg/m    Physical Exam Constitutional:      Appearance: Normal appearance.  HENT:     Head: Normocephalic and atraumatic.     Right Ear: Tympanic membrane, ear canal and external ear normal. There is no impacted cerumen.     Left Ear: Tympanic membrane, ear canal and external ear normal. There is no impacted cerumen.     Nose: Nose normal.     Mouth/Throat:     Pharynx: Oropharynx is clear.  Eyes:     Conjunctiva/sclera: Conjunctivae normal.  Cardiovascular:     Rate and Rhythm: Normal rate and regular rhythm.  Pulmonary:     Effort: Pulmonary effort is normal.     Breath sounds: Normal breath sounds.  Musculoskeletal:     Cervical back: Neck supple. No tenderness.  Lymphadenopathy:     Cervical: No cervical adenopathy.  Skin:    General: Skin is warm and dry.  Neurological:     Mental Status: She is alert and oriented to person, place, and time.  Psychiatric:        Mood and Affect: Mood normal.     No results found for any visits on 07/04/23.      Assessment & Plan:   Problem List Items Addressed This Visit   None Visit Diagnoses       Acute non-recurrent sinusitis, unspecified location    -  Primary   Relevant Medications   fluticasone  (FLONASE ) 50 MCG/ACT  nasal spray       Acute sinusitis-most consistent with viral illness.  Recommend nasal saline irrigation we actually had a NeilMed sample bottle so that she could try it also go ahead and start Flonase /Nasonex.  If she is not feeling better over the next 4 to 5 days then please let us  know.  And we will send in an antibiotic.  Let us  know sooner if develops new or worsening symptoms.  Meds ordered this encounter  Medications   omeprazole  (PRILOSEC) 40 MG capsule    Sig: Take 1 capsule (40 mg total) by mouth daily.    Dispense:  90 capsule    Refill:  1   fluticasone  (FLONASE ) 50 MCG/ACT nasal spray    Sig: Place 2 sprays into both nostrils daily.    Dispense:  16 g    Refill:  6    No follow-ups on file.  Dorothyann Byars, MD

## 2023-07-06 ENCOUNTER — Telehealth: Payer: Self-pay

## 2023-07-06 NOTE — Telephone Encounter (Signed)
 Copied from CRM [id number]. Topic: Clinical - Medical Advice >> Jul 06, 2023  9:03 AM Joesph PARAS wrote: Reason for CRM: Patient was advised to call back if not feeling better. Patient states that she is feeling even worse now and is hoping that PCP may call her in a medication (mentioned Z Pack) if that may help. Patient states she is much more congested.

## 2023-07-09 ENCOUNTER — Telehealth: Payer: Self-pay | Admitting: *Deleted

## 2023-07-09 MED ORDER — AZITHROMYCIN 250 MG PO TABS: ORAL_TABLET | ORAL | 0 refills | Status: AC

## 2023-07-09 NOTE — Telephone Encounter (Signed)
 Meds ordered this encounter  Medications   azithromycin (ZITHROMAX) 250 MG tablet    Sig: Take 2 tablets on day 1, then 1 tablet daily on days 2 through 5    Dispense:  6 tablet    Refill:  0

## 2023-07-09 NOTE — Telephone Encounter (Signed)
 Called pt and advised her of ABX being sent.

## 2023-07-09 NOTE — Telephone Encounter (Signed)
 Patient informed.

## 2023-07-09 NOTE — Telephone Encounter (Signed)
 Copied from CRM [id number]. Topic: Clinical - Medical Advice >> Jul 09, 2023  9:08 AM Bascom RAMAN wrote: Reason for CRM: Patient states that Dr Alvan said to call if sinus issues have not improved or got worse to call. Call back number is [phone] or cell [phone]

## 2023-09-04 ENCOUNTER — Ambulatory Visit (INDEPENDENT_AMBULATORY_CARE_PROVIDER_SITE_OTHER): Payer: Medicare Other

## 2023-09-04 VITALS — Ht 65.0 in | Wt 180.0 lb

## 2023-09-04 DIAGNOSIS — Z0000 Encounter for general adult medical examination without abnormal findings: Secondary | ICD-10-CM | POA: Diagnosis not present

## 2023-09-04 NOTE — Progress Notes (Signed)
 Subjective:   Anne [name] is a 73 y.o. female who presents for Medicare Annual (Subsequent) preventive examination.  Visit Complete: Virtual I connected with  Anne [name] on 09/04/23 by a audio enabled telemedicine application and verified that I am speaking with the correct person using two identifiers.  Patient Location: Home  Provider Location: Office/Clinic  I discussed the limitations of evaluation and management by telemedicine. The patient expressed understanding and agreed to proceed.  Vital Signs: Because this visit was a virtual/telehealth visit, some criteria may be missing or patient reported. Any vitals not documented were not able to be obtained and vitals that have been documented are patient reported.  Patient Medicare AWV questionnaire was completed by the patient on 08/29/2023; I have confirmed that all information answered by patient is correct and no changes since this date.  Cardiac Risk Factors include: advanced age (>58men, >9 women);dyslipidemia;obesity (BMI >30kg/m2);smoking/ tobacco exposure;family history of premature cardiovascular disease     Objective:    Today's Vitals   09/04/23 1354  Weight: 180 lb (81.6 kg)  Height: 5\' 5"  (1.651 m)   Body mass index is 29.95 kg/m.     09/04/2023    2:08 PM 08/29/2022    4:04 PM 04/19/2022   11:08 AM 03/30/2021   10:08 AM 08/09/2020    3:12 PM 08/04/2019    3:45 PM 12/06/2017    8:39 PM  Advanced Directives  Does Patient Have a Medical Advance Directive? No No No No No No No  Would patient like information on creating a medical advance directive? No - Patient declined No - Patient declined No - Patient declined  No - Patient declined No - Patient declined No - Patient declined    Current Medications (verified) Outpatient Encounter Medications as of 09/04/2023  Medication Sig   acetaminophen (TYLENOL) 325 MG tablet Take 650 mg by mouth every 6 (six) hours as needed.   CALCIUM PO Take 1  tablet by mouth daily.    Cholecalciferol (VITAMIN D) 2000 UNITS tablet Take 2,000 Units by mouth daily.   clobetasol (TEMOVATE) 0.05 % external solution Apply 1 application topically 2 (two) times daily.   estradiol (ESTRACE) 0.1 MG/GM vaginal cream Place 0.25 Applicatorfuls vaginally as needed. For vaginal irritation   fluticasone (FLONASE) 50 MCG/ACT nasal spray Place 2 sprays into both nostrils daily.   meloxicam (MOBIC) 15 MG tablet TAKE 1 TABLET (15 MG TOTAL) BY MOUTH DAILY.   metoprolol succinate (TOPROL-XL) 50 MG 24 hr tablet TAKE 1 AND 1/2 TABS DAILY TO = 75 MG DAILY   Multiple Vitamin (MULTIVITAMIN) tablet Take 1 tablet by mouth daily.   omeprazole (PRILOSEC) 40 MG capsule Take 1 capsule (40 mg total) by mouth daily.   simvastatin (ZOCOR) 40 MG tablet TAKE 1 TABLET (40 MG TOTAL) BY MOUTH DAILY. NEEDS LABS   SYNTHROID 75 MCG tablet TAKE 1 TABLET BY MOUTH EVERY DAY BEFORE BREAKFAST   No facility-administered encounter medications on file as of 09/04/2023.    Allergies (verified) Amoxicillin-pot clavulanate, Ceftriaxone sodium, Cephalexin, Codeine, Molds & smuts, and Zofran [ondansetron hcl]   History: Past Medical History:  Diagnosis Date   Acid reflux    Aortic atherosclerosis (HCC) 04/28/2013   CT 08/2017   Arthritis    "joints, hands" (08/30/2017)   Chronic lower back pain    Disc herniation    neck and low back   Family history of adverse reaction to anesthesia    "daughter w/PONV"   Follicular cystitis  Hiatal hernia    High cholesterol    Hyperlipidemia    ELEV CHOLESTEROL   Hypothyroidism    Osteopenia 2014   T score -1.6 FRAX 13%/1%   PAF (paroxysmal atrial fibrillation) (HCC) 08/30/2017   Echo 2/19: EF 55-60, normal wall motion, grade 1 diastolic dysfunction, mild PI, trivial TR //  Nuc 3/19: no ischemia or scar, EF 80 // CHADS2-VASc: 3 // Eliquis 5 bid    Schatzki's ring    Past Surgical History:  Procedure Laterality Date   BREAST BIOPSY Bilateral 1997    "excisional bx both benign"   BREAST CYST ASPIRATION Right ~ 1993   CATARACT EXTRACTION W/ INTRAOCULAR LENS IMPLANT Left 2011   CATARACT EXTRACTION W/ INTRAOCULAR LENS IMPLANT Right 12/20/2012   Dr. Areta Haber, OD   COLONOSCOPY WITH ESOPHAGOGASTRODUODENOSCOPY (EGD)  "several"   "? dilated esophagus" (08/30/2017)   DILATION AND CURETTAGE OF UTERUS  1996   EYE MUSCLE SURGERY Bilateral 1992   HERNIA REPAIR     HYSTEROSCOPY     LAPAROSCOPIC CHOLECYSTECTOMY  09/28/2011   OOPHORECTOMY Right 04/2000   LAP RSO/LYSIS OF ADHESIONS   THYROIDECTOMY  1976   TUBAL LIGATION  1976   VARICOSE VEIN SURGERY Bilateral 1972   VENTRAL HERNIA REPAIR  06/23/13   Dr. Gennette Pac   Family History  Problem Relation Age of Onset   Hypertension Mother    Heart attack Father 57   Heart disease Father    Anuerysm Father    Stroke Sister    Hypertension Sister    Arthritis Sister    Lupus Sister    Cancer Sister    Breast cancer Sister        Age 64   Rheum arthritis Sister    Stroke Sister    Cancer Brother 22       melanoma   Melanoma Brother    Prostate cancer Brother    Leukemia Brother    Pulmonary fibrosis Brother    Arthritis Daughter        Rheumatoid   Hyperparathyroidism Daughter    Social History   Socioeconomic History   Marital status: Married    Spouse name: Anne [name]   Number of children: 1   Years of education: 10   Highest education level: GED or equivalent  Occupational History   Occupation: Quarry manager    Comment: retired  Tobacco Use   Smoking status: Former    Current packs/day: 0.00    Average packs/day: 0.1 packs/day for 18.0 years (1.8 ttl pk-yrs)    Types: Cigarettes    Start date: 01/26/1961    Quit date: 01/27/1979    Years since quitting: 44.6    Passive exposure: Past   Smokeless tobacco: Never  Vaping Use   Vaping status: Never Used  Substance and Sexual Activity   Alcohol use: Yes    Alcohol/week: 1.0 standard drink of alcohol    Types: 1 Glasses of  wine per week    Comment: 2 glasses of wine a month   Drug use: No   Sexual activity: Not Currently    Partners: Male    Birth control/protection: Post-menopausal  Other Topics Concern   Not on file  Social History Narrative   Tries to go the park to walk with her husband. Likes to take day trips, and yard work.   Social Drivers of Health   Financial Resource Strain: Low Risk  (09/04/2023)   Overall Financial Resource Strain (CARDIA)    Difficulty  of Paying Living Expenses: Not hard at all  Food Insecurity: No Food Insecurity (09/04/2023)   Hunger Vital Sign    Worried About Running Out of Food in the Last Year: Never true    Ran Out of Food in the Last Year: Never true  Transportation Needs: No Transportation Needs (09/04/2023)   PRAPARE - Administrator, Civil Service (Medical): No    Lack of Transportation (Non-Medical): No  Physical Activity: Insufficiently Active (09/04/2023)   Exercise Vital Sign    Days of Exercise per Week: 3 days    Minutes of Exercise per Session: 40 min  Stress: No Stress Concern Present (09/04/2023)   [employer] of Occupational Health - Occupational Stress Questionnaire    Feeling of Stress : Not at all  Social Connections: Moderately Isolated (09/04/2023)   Social Connection and Isolation Panel [NHANES]    Frequency of Communication with Friends and Family: More than three times a week    Frequency of Social Gatherings with Friends and Family: More than three times a week    Attends Religious Services: Never    Database administrator or Organizations: No    Attends Engineer, structural: Never    Marital Status: Married    Tobacco Counseling Counseling given: Not Answered   Clinical Intake:  Pre-visit preparation completed: Yes  Pain : No/denies pain     BMI - recorded: 29.95 Nutritional Status: BMI 25 -29 Overweight Nutritional Risks: None Diabetes: No  How often do you need to have someone help you when you  read instructions, pamphlets, or other written materials from your doctor or pharmacy?: 1 - Never What is the last grade level you completed in school?: 12  Interpreter Needed?: No      Activities of Daily Living    09/04/2023    1:56 PM  In your present state of health, do you have any difficulty performing the following activities:  Hearing? 0  Vision? 0  Difficulty concentrating or making decisions? 0  Walking or climbing stairs? 0  Dressing or bathing? 0  Doing errands, shopping? 0  Preparing Food and eating ? N  Using the Toilet? N  In the past six months, have you accidently leaked urine? N  Do you have problems with loss of bowel control? N  Managing your Medications? N  Managing your Finances? N  Housekeeping or managing your Housekeeping? N    Patient Care Team: Agapito Games, Anne [name] as PCP - General (Family Medicine) Nahser, Deloris Ping, Anne [name] as PCP - Cardiology (Cardiology) Melene Plan, Anne [name] as Referring Physician (Urology) Audie Box, Nadyne Coombes, Anne [name] (Inactive) as Consulting Physician (Gynecology) Delorise Royals, Anne [name] as Referring Physician (Endocrinology) Farris Has, Anne [name] as Referring Physician (Dermatology) Gabriel Carina, [facility] (Inactive) as Pharmacist (Pharmacist)  Indicate any recent Medical Services you may have received from other than [facility] providers in the past year (date may be approximate).     Assessment:   This is a routine wellness examination for Anne [name].  Hearing/Vision screen No results found.   Goals Addressed             This Visit's Progress    Activity and Exercise Increased       She would like to continue to keep the weight down and stay active.       Depression Screen    09/04/2023    2:05 PM 08/29/2022    4:04 PM 04/06/2022    1:36 PM 10/04/2021  2:21 PM 07/05/2021    2:32 PM 08/09/2020    3:14 PM 08/04/2019    3:46 PM  PHQ 2/9 Scores  PHQ - 2 Score 0 0 0 0 0 0 0  PHQ- 9 Score      0     Fall Risk     09/04/2023    2:09 PM 08/29/2022    4:04 PM 04/06/2022    1:36 PM 10/04/2021    2:21 PM 07/05/2021    2:32 PM  Fall Risk   Falls in the past year? 0 0 0 0 0  Number falls in past yr: 0 0 0 0 0  Injury with Fall? 0 0 0 0 0  Risk for fall due to : No Fall Risks No Fall Risks No Fall Risks No Fall Risks No Fall Risks  Follow up Falls evaluation completed Falls evaluation completed Falls evaluation completed Falls prevention discussed;Falls evaluation completed Falls prevention discussed;Falls evaluation completed    MEDICARE RISK AT HOME: Medicare Risk at Home Any stairs in or around the home?: Yes If so, are there any without handrails?: No Home free of loose throw rugs in walkways, pet beds, electrical cords, etc?: Yes Adequate lighting in your home to reduce risk of falls?: Yes Life alert?: No Use of a cane, walker or w/c?: No Grab bars in the bathroom?: Yes Shower chair or bench in shower?: No Elevated toilet seat or a handicapped toilet?: Yes  TIMED UP AND GO:  Was the test performed?  No    Cognitive Function:        09/04/2023    2:13 PM 08/29/2022    4:12 PM 08/09/2020    3:28 PM 08/04/2019    3:49 PM 02/08/2017   10:52 AM  6CIT Screen  What Year? 0 points 0 points 0 points 0 points 0 points  What month? 0 points 0 points 0 points 0 points 0 points  What time? 0 points 0 points 0 points 0 points 0 points  Count back from 20 0 points 0 points 0 points 0 points 0 points  Months in reverse 0 points 0 points 2 points 0 points 0 points  Repeat phrase 0 points 0 points 0 points 0 points 0 points  Total Score 0 points 0 points 2 points 0 points 0 points    Immunizations Immunization History  Administered Date(s) Administered   Influenza Split 06/02/2011, 05/03/2012   Influenza Whole 04/16/2009   Influenza,inj,Quad PF,6+ Mos 05/01/2013, 04/17/2014   Pneumococcal Conjugate-13 10/17/2013   Pneumococcal Polysaccharide-23 02/04/2016   Td 01/21/2010   Zoster, Live 08/18/2011     TDAP status: Due, Education has been provided regarding the importance of this vaccine. Advised may receive this vaccine at local pharmacy or Health Dept. Aware to provide a copy of the vaccination record if obtained from local pharmacy or Health Dept. Verbalized acceptance and understanding.  Flu Vaccine status: Declined, Education has been provided regarding the importance of this vaccine but patient still declined. Advised may receive this vaccine at local pharmacy or Health Dept. Aware to provide a copy of the vaccination record if obtained from local pharmacy or Health Dept. Verbalized acceptance and understanding.  Pneumococcal vaccine status: Up to date  Covid-19 vaccine status: Declined, Education has been provided regarding the importance of this vaccine but patient still declined. Advised may receive this vaccine at local pharmacy or Health Dept.or vaccine clinic. Aware to provide a copy of the vaccination record if obtained from local pharmacy or  Health Dept. Verbalized acceptance and understanding.  Qualifies for Shingles Vaccine? Yes   Zostavax completed Yes   Shingrix Completed?: No.    Education has been provided regarding the importance of this vaccine. Patient has been advised to call insurance company to determine out of pocket expense if they have not yet received this vaccine. Advised may also receive vaccine at local pharmacy or Health Dept. Verbalized acceptance and understanding.  Screening Tests Health Maintenance  Topic Date Due   Zoster Vaccines- Shingrix (1 of 2) 09/10/1998   DTaP/Tdap/Td (2 - Tdap) 01/22/2020   INFLUENZA VACCINE  09/24/2023 (Originally 01/25/2023)   COVID-19 Vaccine (1 - 2024-25 season) 04/25/2024 (Originally 02/25/2023)   Colonoscopy  04/09/2028 (Originally 02/09/2023)   Medicare Annual Wellness (AWV)  09/03/2024   MAMMOGRAM  04/01/2025   DEXA SCAN  10/13/2026   Pneumonia Vaccine 64+ Years old  Completed   Hepatitis C Screening  Completed   HPV  VACCINES  Aged Out    Health Maintenance  Health Maintenance Due  Topic Date Due   Zoster Vaccines- Shingrix (1 of 2) 09/10/1998   DTaP/Tdap/Td (2 - Tdap) 01/22/2020    Colorectal cancer screening: Type of screening: Colonoscopy. Completed 02/08/2018. Repeat every 10 years  Mammogram status: Completed 04/02/2023. Repeat every year  Bone Density status: Completed 10/12/2021. Results reflect: Bone density results: OSTEOPENIA. Repeat every 2 years.  Lung Cancer Screening: (Low Dose CT Chest recommended if Age 19-80 years, 20 pack-year currently smoking OR have quit w/in 15years.) does not qualify.   Lung Cancer Screening Referral: n/a  Additional Screening:  Hepatitis C Screening: does qualify; Completed 02/04/2016  Vision Screening: Recommended annual ophthalmology exams for early detection of glaucoma and other disorders of the eye. Is the patient up to date with their annual eye exam?  Yes  Who is the provider or what is the name of the office in which the patient attends annual eye exams? [facility]  If pt is not established with a provider, would they like to be referred to a provider to establish care?  N/a .   Dental Screening: Recommended annual dental exams for proper oral hygiene    Community Resource Referral / Chronic Care Management: CRR required this visit?  No   CCM required this visit?  No     Plan:     I have personally reviewed and noted the following in the patient's chart:   Medical and social history Use of alcohol, tobacco or illicit drugs  Current medications and supplements including opioid prescriptions. Patient is not currently taking opioid prescriptions. Functional ability and status Nutritional status Physical activity Advanced directives List of other physicians Hospitalizations, surgeries, and ER visits in previous 12 months- No Vitals Screenings to include cognitive, depression, and falls Referrals and appointments  In addition, I  have reviewed and discussed with patient certain preventive protocols, quality metrics, and best practice recommendations. A written personalized care plan for preventive services as well as general preventive health recommendations were provided to patient.     Anne [name], CMA   09/04/2023   After Visit Summary: (MyChart) Due to this being a telephonic visit, the after visit summary with patients personalized plan was offered to patient via MyChart   Nurse Notes:    Anne [name] is a 73 y.o. female patient of Anne [name], Anne Ehlers, Anne [name] who had a Medicare Annual Wellness Visit today via telephone. Anne [name] is Retired and lives with their spouse. she has 1 child. She reports that she is  socially active and does interact with friends/family regularly. She is moderately physically active and enjoys day trips and yard work.

## 2023-09-04 NOTE — Patient Instructions (Signed)
  Anne [name] , Thank you for taking time to come for your Medicare Wellness Visit. I appreciate your ongoing commitment to your health goals. Please review the following plan we discussed and let me know if I can assist you in the future.   These are the goals we discussed:  Goals       Activity and Exercise Increased      She would like to continue to keep the weight down and stay active.       Patient Stated (pt-stated)      08/09/2020 AWV Goal: Improved Nutrition/Diet  Patient will verbalize understanding that diet plays an important role in overall health and that a poor diet is a risk factor for many chronic medical conditions.  Over the next year, patient will improve self management of their diet by incorporating fewer sweetened foods & beverages. Patient will utilize available community resources to help with food acquisition if needed (ex: food pantries, [address], etc) Patient will work with nutrition specialist if a referral was made       Patient Stated (pt-stated)      Patient stated she would like to loose 20 lbs.      Weight (lb) < 200 lb (90.7 kg)      Patient states would ike to loose 20 lbs.        This is a list of the screening recommended for you and due dates:  Health Maintenance  Topic Date Due   Zoster (Shingles) Vaccine (1 of 2) 09/10/1998   DTaP/Tdap/Td vaccine (2 - Tdap) 01/22/2020   Flu Shot  09/24/2023*   COVID-19 Vaccine (1 - 2024-25 season) 04/25/2024*   Colon Cancer Screening  04/09/2028*   Medicare Annual Wellness Visit  09/03/2024   Mammogram  04/01/2025   DEXA scan (bone density measurement)  10/13/2026   Pneumonia Vaccine  Completed   Hepatitis C Screening  Completed   HPV Vaccine  Aged Out  *Topic was postponed. The date shown is not the original due date.

## 2023-09-27 ENCOUNTER — Other Ambulatory Visit: Payer: Self-pay | Admitting: Family Medicine

## 2023-09-27 ENCOUNTER — Other Ambulatory Visit: Payer: Self-pay | Admitting: Cardiovascular Disease

## 2023-10-09 ENCOUNTER — Encounter: Payer: Self-pay | Admitting: Family Medicine

## 2023-10-09 ENCOUNTER — Ambulatory Visit (INDEPENDENT_AMBULATORY_CARE_PROVIDER_SITE_OTHER): Payer: Medicare Other | Admitting: Family Medicine

## 2023-10-09 VITALS — BP 129/63 | HR 64 | Ht 65.0 in | Wt 183.0 lb

## 2023-10-09 DIAGNOSIS — E039 Hypothyroidism, unspecified: Secondary | ICD-10-CM

## 2023-10-09 DIAGNOSIS — K219 Gastro-esophageal reflux disease without esophagitis: Secondary | ICD-10-CM

## 2023-10-09 DIAGNOSIS — L814 Other melanin hyperpigmentation: Secondary | ICD-10-CM | POA: Diagnosis not present

## 2023-10-09 DIAGNOSIS — I10 Essential (primary) hypertension: Secondary | ICD-10-CM

## 2023-10-09 MED ORDER — SYNTHROID 75 MCG PO TABS: 75.0000 ug | ORAL_TABLET | Freq: Every day | ORAL | 1 refills | Status: DC

## 2023-10-09 NOTE — Progress Notes (Signed)
   Established Patient Office Visit  Subjective  Patient ID: Anne [name], female    DOB: 01-08-1951  Age: 73 y.o. MRN: [id number]  Chief Complaint  Patient presents with   Hypertension   Hypothyroidism    HPI  Follow-up for hypertension and hypothyroidism.  Hypothyroidism - Taking medication regularly in the AM away from food and vitamins, etc. No recent change to skin, hair, or energy levels.  She takes a half a tab 1 day a week on Sundays and a whole tab the other 6 days a week.   She has spots on her arms she wants me to look at.  He says when they pop up they are itchy she usually uses a little bit of her topical steroid cream and they go away.      ROS    Objective:     BP 129/63   Pulse 64   Ht 5\' 5"  (1.651 m)   Wt 183 lb (83 kg)   LMP 09/08/2000   SpO2 98%   BMI 30.45 kg/m    Physical Exam Vitals and nursing note reviewed.  Constitutional:      Appearance: Normal appearance.  HENT:     Head: Normocephalic and atraumatic.  Eyes:     Conjunctiva/sclera: Conjunctivae normal.  Cardiovascular:     Rate and Rhythm: Normal rate and regular rhythm.  Pulmonary:     Effort: Pulmonary effort is normal.     Breath sounds: Normal breath sounds.  Skin:    General: Skin is warm and dry.     Comments: In her arms she has several scattered solar lentigo.  No distinct rash today.  Neurological:     Mental Status: She is alert.  Psychiatric:        Mood and Affect: Mood normal.    No results found for any visits on 10/09/23.    The 10-year ASCVD risk score (Arnett DK, et al., 2019) is: 20.9%    Assessment & Plan:   Problem List Items Addressed This Visit       Cardiovascular and Mediastinum   Essential hypertension - Primary   Well controlled. Continue current regimen. Follow up in  14mo       Relevant Orders   CMP14+EGFR   Lipid panel   CBC   TSH     Digestive   GERD (gastroesophageal reflux disease)   Takes omeprazole 40 mg capsule  daily.  History of Schatzki's ring.        Endocrine   Hypothyroidism   Recheck TSH.  Will call with results once available.  Will refill her medications today.  She says the pharmacy never got the prescription.      Relevant Medications   SYNTHROID 75 MCG tablet   Other Relevant Orders   CMP14+EGFR   Lipid panel   CBC   TSH   Other Visit Diagnoses       Solar lentigo           She does occasionally get some erythematous bumps on her forearms she uses Cereve to moisturize when she gets out of the shower and just uses the topical steroid cream as needed and that seems to work and it seems to resolve it.  She does have some solar lentigo just encouraged her to prevent further sun damage.  Return in about 6 months (around 04/09/2024) for Hypertension.    Nani Gasser, MD

## 2023-10-09 NOTE — Assessment & Plan Note (Signed)
 Well controlled. Continue current regimen. Follow up in  6 mo

## 2023-10-09 NOTE — Progress Notes (Signed)
 Pt would like Dr. Greer Leak to look at her skin to see if she should see a dermatologist.   She also would like Rx's for Voltaren gel and tablets if possible.

## 2023-10-09 NOTE — Assessment & Plan Note (Addendum)
 Recheck TSH.  Will call with results once available.  Will refill her medications today.  She says the pharmacy never got the prescription.

## 2023-10-09 NOTE — Assessment & Plan Note (Signed)
 Takes omeprazole 40 mg capsule daily.  History of Schatzki's ring.

## 2023-10-10 ENCOUNTER — Encounter: Payer: Self-pay | Admitting: Family Medicine

## 2023-10-10 LAB — CBC
Hematocrit: 42.8 % (ref 34.0–46.6)
Hemoglobin: 14.6 g/dL (ref 11.1–15.9)
MCH: 29.4 pg (ref 26.6–33.0)
MCHC: 34.1 g/dL (ref 31.5–35.7)
MCV: 86 fL (ref 79–97)
Platelets: 203 10*3/uL (ref 150–450)
RBC: 4.97 x10E6/uL (ref 3.77–5.28)
RDW: 13.6 % (ref 11.7–15.4)
WBC: 5.6 10*3/uL (ref 3.4–10.8)

## 2023-10-10 LAB — LIPID PANEL
Chol/HDL Ratio: 4.1 ratio (ref 0.0–4.4)
Cholesterol, Total: 177 mg/dL (ref 100–199)
HDL: 43 mg/dL (ref >39)
LDL Chol Calc (NIH): 111 mg/dL — ABNORMAL HIGH (ref 0–99)
Triglycerides: 127 mg/dL (ref 0–149)
VLDL Cholesterol Cal: 23 mg/dL (ref 5–40)

## 2023-10-10 LAB — CMP14+EGFR
ALT: 16 IU/L (ref 0–32)
AST: 20 IU/L (ref 0–40)
Albumin: 4.4 g/dL (ref 3.8–4.8)
Alkaline Phosphatase: 84 IU/L (ref 44–121)
BUN/Creatinine Ratio: 13 (ref 12–28)
BUN: 12 mg/dL (ref 8–27)
Bilirubin Total: 0.7 mg/dL (ref 0.0–1.2)
CO2: 22 mmol/L (ref 20–29)
Calcium: 9.2 mg/dL (ref 8.7–10.3)
Chloride: 103 mmol/L (ref 96–106)
Creatinine, Ser: 0.92 mg/dL (ref 0.57–1.00)
Globulin, Total: 2.4 g/dL (ref 1.5–4.5)
Glucose: 109 mg/dL — ABNORMAL HIGH (ref 70–99)
Potassium: 4.5 mmol/L (ref 3.5–5.2)
Sodium: 139 mmol/L (ref 134–144)
Total Protein: 6.8 g/dL (ref 6.0–8.5)
eGFR: 65 mL/min/{1.73_m2} (ref >59)

## 2023-10-10 LAB — TSH: TSH: 2.37 u[IU]/mL (ref 0.450–4.500)

## 2023-10-10 NOTE — Progress Notes (Signed)
 HI Anne [name], your LDL cholesterol is a little high. Continue to work on healthy diet and regular exercise.

## 2023-10-15 NOTE — Telephone Encounter (Signed)
 Copied from CRM [id number]. Topic: Clinical - Medication Question >> Oct 15, 2023  3:44 PM Eleanore Grey wrote: Reason for CRM: Patient spoke with Lynnie Saucier last week at visit with Dr. Greer Leak about a prescription being sent in for voltaren gel but she says only the synthroid  medication was sent in.

## 2023-10-19 ENCOUNTER — Other Ambulatory Visit: Payer: Self-pay | Admitting: *Deleted

## 2023-10-19 MED ORDER — DICLOFENAC SODIUM 1 % EX GEL: 2.0000 g | Freq: Four times a day (QID) | CUTANEOUS | 99 refills | Status: AC

## 2023-10-24 ENCOUNTER — Telehealth: Payer: Self-pay | Admitting: *Deleted

## 2023-10-24 NOTE — Telephone Encounter (Signed)
 Medication sent.

## 2023-12-16 ENCOUNTER — Encounter: Payer: Self-pay | Admitting: Cardiovascular Disease

## 2023-12-16 NOTE — Progress Notes (Unsigned)
 Cardiology Office Note:    Date:  12/17/2023   ID:  Rogue, Pautler 12/14/1950, MRN [id number]  PCP:  Alvan Dorothyann BIRCH, MD  Cardiologist:  Aleene Passe, MD  Electrophysiologist:  None   Referring MD: Alvan Dorothyann BIRCH, *   Chief Complaint  Patient presents with   Atrial Fibrillation          Previous notes    Anne [name] is a 73 y.o. female with a hx of paroxysmal atrial fibrillation, hypertension, hyperlipidemia, hypothyroidism with prior thyroid  thyroidectomy.    I met her in March , 2019 in the hospital when she was admitted with rapid AFib. The Afib Spontaneously converted.  She had a mild troponin bump.     Her TSH was very low and it was thought that she may have been over replaced with her Synthroid . Sleep study in June, 2019 was negative for obstructive sleep apnea.  He was recently seen by Glendia Ferrier, PA in July for further evaluation of rapid atrial fibrillation.   CHADS2-VASc= 4 (age x1, female, HTN, aortic atherosclerosis).    Dec. 20, 2019 Blanca is seen with husband, Signe   No further episodes of Afib Uses a Kardia monitor.   Husband also has Afib  Has a hissing sound in her right ear ,  Has seen ENT.   June 03, 2019:   Anne [name] is seen today for follow-up of her paroxysmal atrial fibrillation and chronic diastolic congestive heart failure. No CP or dyspnea No significant palpitations Has remained in NSR - has PAF,  Remains on Eliquis    Feb. 1, 2022 Anne [name] is seen today for follow up of her PAF and chronic diastolic CHF Is very active.  Works in the yard.   No CP , no dyspnea. Ferrin is wondering if she needs to continue taking the Eliquis  - she  Has not had an episode of Afib since she had the brief episode while in the hospital with hyperthypoidism.  Her PAF was a single episode of   October 21, 2021:  Anne [name] seen today for follow-up of her paroxysmal atrial fibrillation and chronic diastolic congestive heart  failure Walks regularly  Does yard work regularly  [employer] back to the [employer] .    Oct 31, 2022 Anne [name] is seen for follow up of her PAF, chronic diastolic CHF She was concerned about PAD at her last office visit  ( she saw our posters) Lower ext arterial doppler study did not reveal any evidence of PAD  Has not had any atrial fib in 4-5 years  Is getting her thyroid  checked.  Has some palpitations at night .   On occasion wil take an extra 1/2 tab of metoprolol    We had stopped her eliquis  2 years ago ( at her request )  We had a long discussion about stroke risk She will add another CHADs2VAsc point next year ( age 81)   She agrees to restart the eliquis    December 17, 2023 Anne [name] is seen for follow up of her PAF , chronic diastolic CHF  Doing ok  No dyspnea  Not much exercise , Is active around the house   We had stopped her eliquis  2 years ago ( at her request )  We had a long discussion about stroke risk She will add another CHADs2VAsc point next year ( age 59)   We discussed again today , she is convinced that  her thyroid  medication was the cause of her atrial fib  and that she is at low risk for Afib as long as her thyroid  medications are well controlled.  Her last episode of atrial fib was 6 years ago  She understands the risk        Past Medical History:  Diagnosis Date   Acid reflux    Aortic atherosclerosis (HCC) 04/28/2013   CT 08/2017   Arthritis    joints, hands (08/30/2017)   Chronic lower back pain    Disc herniation    neck and low back   Family history of adverse reaction to anesthesia    daughter w/PONV   Follicular cystitis    Hiatal hernia    High cholesterol    Hyperlipidemia    ELEV CHOLESTEROL   Hypothyroidism    Osteopenia 2014   T score -1.6 FRAX 13%/1%   PAF (paroxysmal atrial fibrillation) (HCC) 08/30/2017   Echo 2/19: EF 55-60, normal wall motion, grade 1 diastolic dysfunction, mild PI, trivial TR //  Nuc 3/19: no ischemia or  scar, EF 80 // CHADS2-VASc: 3 // Eliquis  5 bid    Schatzki's ring     Past Surgical History:  Procedure Laterality Date   BREAST BIOPSY Bilateral 1997   excisional bx both benign   BREAST CYST ASPIRATION Right ~ 1993   CATARACT EXTRACTION W/ INTRAOCULAR LENS IMPLANT Left 2011   CATARACT EXTRACTION W/ INTRAOCULAR LENS IMPLANT Right 12/20/2012   Dr. Elenor Ferrier, OD   COLONOSCOPY WITH ESOPHAGOGASTRODUODENOSCOPY (EGD)  several   ? dilated esophagus (08/30/2017)   DILATION AND CURETTAGE OF UTERUS  1996   EYE MUSCLE SURGERY Bilateral 1992   HERNIA REPAIR     HYSTEROSCOPY     LAPAROSCOPIC CHOLECYSTECTOMY  09/28/2011   OOPHORECTOMY Right 04/2000   LAP RSO/LYSIS OF ADHESIONS   THYROIDECTOMY  1976   TUBAL LIGATION  1976   VARICOSE VEIN SURGERY Bilateral 1972   VENTRAL HERNIA REPAIR  06/23/13   Dr. Glendia Grieve    Current Medications: Current Meds  Medication Sig   acetaminophen  (TYLENOL ) 325 MG tablet Take 650 mg by mouth every 6 (six) hours as needed.   CALCIUM  PO Take 1 tablet by mouth daily.    Cholecalciferol  (VITAMIN D ) 2000 UNITS tablet Take 2,000 Units by mouth daily.   clobetasol  (TEMOVATE ) 0.05 % external solution Apply 1 application topically 2 (two) times daily.   diclofenac  Sodium (VOLTAREN ) 1 % GEL Apply 2 g topically 4 (four) times daily.   estradiol  (ESTRACE ) 0.1 MG/GM vaginal cream Place 0.25 Applicatorfuls vaginally as needed. For vaginal irritation   fluticasone  (FLONASE ) 50 MCG/ACT nasal spray Place 2 sprays into both nostrils daily.   meloxicam  (MOBIC ) 15 MG tablet TAKE 1 TABLET (15 MG TOTAL) BY MOUTH DAILY.   metoprolol  succinate (TOPROL -XL) 50 MG 24 hr tablet Take 1.5 tablets (75 mg total) by mouth daily.   Multiple Vitamin (MULTIVITAMIN) tablet Take 1 tablet by mouth daily.   omeprazole  (PRILOSEC) 40 MG capsule Take 1 capsule (40 mg total) by mouth daily.   simvastatin  (ZOCOR ) 40 MG tablet TAKE 1 TABLET (40 MG TOTAL) BY MOUTH DAILY. NEEDS LABS   SYNTHROID   75 MCG tablet Take 1 tablet (75 mcg total) by mouth daily before breakfast.     Allergies:   Amoxicillin-pot clavulanate, Ceftriaxone sodium, Cephalexin, Codeine, Molds & smuts, and Zofran murleen hcl]   Social History   Socioeconomic History   Marital status: Married    Spouse name: [name]   Number of children: 1   Years of education:  10   Highest education level: GED or equivalent  Occupational History   Occupation: Quarry manager    Comment: retired  Tobacco Use   Smoking status: Former    Current packs/day: 0.00    Average packs/day: 0.1 packs/day for 18.0 years (1.8 ttl pk-yrs)    Types: Cigarettes    Start date: 01/26/1961    Quit date: 01/27/1979    Years since quitting: 44.9    Passive exposure: Past   Smokeless tobacco: Never  Vaping Use   Vaping status: Never Used  Substance and Sexual Activity   Alcohol use: Yes    Alcohol/week: 1.0 standard drink of alcohol    Types: 1 Glasses of wine per week    Comment: 2 glasses of wine a month   Drug use: No   Sexual activity: Not Currently    Partners: Male    Birth control/protection: Post-menopausal  Other Topics Concern   Not on file  Social History Narrative   Tries to go the park to walk with her husband. Likes to take day trips, and yard work.   Social Drivers of Corporate investment banker Strain: Low Risk  (09/04/2023)   Overall Financial Resource Strain (CARDIA)    Difficulty of Paying Living Expenses: Not hard at all  Food Insecurity: No Food Insecurity (09/04/2023)   Hunger Vital Sign    Worried About Running Out of Food in the Last Year: Never true    Ran Out of Food in the Last Year: Never true  Transportation Needs: No Transportation Needs (09/04/2023)   PRAPARE - Administrator, Civil Service (Medical): No    Lack of Transportation (Non-Medical): No  Physical Activity: Insufficiently Active (09/04/2023)   Exercise Vital Sign    Days of Exercise per Week: 3 days    Minutes of Exercise per  Session: 40 min  Stress: No Stress Concern Present (09/04/2023)   [employer] of Occupational Health - Occupational Stress Questionnaire    Feeling of Stress : Not at all  Social Connections: Moderately Isolated (09/04/2023)   Social Connection and Isolation Panel    Frequency of Communication with Friends and Family: More than three times a week    Frequency of Social Gatherings with Friends and Family: More than three times a week    Attends Religious Services: Never    Database administrator or Organizations: No    Attends Engineer, structural: Never    Marital Status: Married     Family History: The patient's family history includes Anuerysm in her father; Arthritis in her daughter and sister; Breast cancer in her sister; Cancer in her sister; Cancer (age of onset: 62) in her brother; Heart attack (age of onset: 52) in her father; Heart disease in her father; Hyperparathyroidism in her daughter; Hypertension in her mother and sister; Leukemia in her brother; Lupus in her sister; Melanoma in her brother; Prostate cancer in her brother; Pulmonary fibrosis in her brother; Rheum arthritis in her sister; Stroke in her sister and sister.  ROS:   Please see the history of present illness.     All other systems reviewed and are negative.  EKGs/Labs/Other Studies Reviewed:    The following studies were reviewed today:   EKG:     EKG Interpretation Date/Time:  Monday December 17 2023 15:01:15 EDT Ventricular Rate:  79 PR Interval:  192 QRS Duration:  110 QT Interval:  384 QTC Calculation: 440 R Axis:   -59  Text  Interpretation: Normal sinus rhythm Left anterior fascicular block Moderate voltage criteria for LVH, may be normal variant ( R in aVL , Cornell product ) When compared with ECG of 31-Aug-2017 01:03, Left anterior fascicular block is now Present Confirmed by Alveta Mungo [phone]) on 12/17/2023 3:23:20 PM    Recent Labs: 10/09/2023: ALT 16; BUN 12; Creatinine, Ser  0.92; Hemoglobin 14.6; Platelets 203; Potassium 4.5; Sodium 139; TSH 2.370  Recent Lipid Panel    Component Value Date/Time   CHOL 177 10/09/2023 1031   TRIG 127 10/09/2023 1031   HDL 43 10/09/2023 1031   CHOLHDL 4.1 10/09/2023 1031   CHOLHDL 3.3 10/06/2022 1407   VLDL 20 08/31/2017 0233   LDLCALC 111 (H) 10/09/2023 1031   LDLCALC 93 10/06/2022 1407    Physical Exam:     Physical Exam: Blood pressure 120/76, pulse 89, height 5' 5 (1.651 m), weight 180 lb 6.4 oz (81.8 kg), last menstrual period 09/08/2000, SpO2 95%.       GEN:  Well nourished, well developed in no acute distress HEENT: Normal NECK: No JVD; No carotid bruits LYMPHATICS: No lymphadenopathy CARDIAC: RRR , n soft systolic murmur  RESPIRATORY:  Clear to auscultation without rales, wheezing or rhonchi  ABDOMEN: Soft, non-tender, non-distended MUSCULOSKELETAL:  No edema; No deformity  SKIN: Warm and dry NEUROLOGIC:  Alert and oriented x 3      EKG:    ASSESSMENT:    1. PAF (paroxysmal atrial fibrillation) (HCC)   2. Chronic diastolic (congestive) heart failure (HCC)   3. Essential hypertension   4. Graves' disease   5. Paroxysmal atrial fibrillation (HCC)       PLAN:    :   Paroxysmal Atrial fib:      We had stopped her eliquis  2 years ago ( at her request )  We had a long discussion about stroke risk She will add another CHADs2VAsc point next year ( age 86)   We discussed again today , she is convinced that  her thyroid  medication was the cause of her atrial fib and that she is at low risk for Afib as long as her thyroid  medications are well controlled.       2.  Hypothyroidism:    keeps a close eye on this    3.   Fatigue:     4.  Leg pain :       Medication Adjustments/Labs and Tests Ordered: Current medicines are reviewed at length with the patient today.  Concerns regarding medicines are outlined above.  Orders Placed This Encounter  Procedures   EKG 12-Lead   No orders of  the defined types were placed in this encounter.    There are no Patient Instructions on file for this visit.   Signed, Mungo Alveta, MD  12/17/2023 3:33 PM    [facility]

## 2023-12-17 ENCOUNTER — Ambulatory Visit: Attending: Cardiovascular Disease | Admitting: Cardiovascular Disease

## 2023-12-17 ENCOUNTER — Encounter: Payer: Self-pay | Admitting: Cardiovascular Disease

## 2023-12-17 VITALS — BP 120/76 | HR 89 | Ht 65.0 in | Wt 180.4 lb

## 2023-12-17 DIAGNOSIS — I480 Paroxysmal atrial fibrillation: Secondary | ICD-10-CM | POA: Diagnosis not present

## 2023-12-17 DIAGNOSIS — I5032 Chronic diastolic (congestive) heart failure: Secondary | ICD-10-CM | POA: Insufficient documentation

## 2023-12-17 DIAGNOSIS — I10 Essential (primary) hypertension: Secondary | ICD-10-CM | POA: Insufficient documentation

## 2023-12-17 DIAGNOSIS — E0500 Thyrotoxicosis with diffuse goiter without thyrotoxic crisis or storm: Secondary | ICD-10-CM | POA: Insufficient documentation

## 2023-12-17 NOTE — Patient Instructions (Signed)
 Follow-Up: At [facility], you and your health needs are our priority.  As part of our continuing mission to provide you with exceptional heart care, our providers are all part of one team.  This team includes your primary Cardiologist (physician) and Advanced Practice Providers or APPs (Physician Assistants and Nurse Practitioners) who all work together to provide you with the care you need, when you need it.  Your next appointment:   1 year(s)  Provider:   Aleene Passe, MD  We recommend signing up for the patient portal called MyChart.  Sign up information is provided on this After Visit Summary.  MyChart is used to connect with patients for Virtual Visits (Telemedicine).  Patients are able to view lab/test results, encounter notes, upcoming appointments, etc.  Non-urgent messages can be sent to your provider as well.   To learn more about what you can do with MyChart, go to [email or web address].

## 2023-12-23 ENCOUNTER — Other Ambulatory Visit: Payer: Self-pay | Admitting: Family Medicine

## 2023-12-23 ENCOUNTER — Other Ambulatory Visit: Payer: Self-pay | Admitting: Cardiovascular Disease

## 2023-12-27 ENCOUNTER — Other Ambulatory Visit: Payer: Self-pay | Admitting: Family Medicine

## 2023-12-28 ENCOUNTER — Other Ambulatory Visit: Payer: Self-pay | Admitting: Family Medicine

## 2024-02-26 ENCOUNTER — Other Ambulatory Visit: Payer: Self-pay | Admitting: Family Medicine

## 2024-02-26 DIAGNOSIS — Z1231 Encounter for screening mammogram for malignant neoplasm of breast: Secondary | ICD-10-CM

## 2024-03-05 NOTE — Progress Notes (Signed)
 Office Visit Note  Patient: Anne [name]             Date of Birth: 1950/07/14           MRN: [id number]             PCP: Alvan Dorothyann BIRCH, MD Referring: Alvan Dorothyann BIRCH, * Visit Date: 03/18/2024 Occupation: Data Unavailable  Subjective:  Pain in joints  History of Present Illness: Anne [name] is a 73 y.o. female with osteoarthritis, degenerative disc disease, anti-CCP antibody and positive ANA.  She returns today after her last visit in September 2024.  She continues to have some discomfort but she has not noticed any swelling.  She notices occasional knots on her fingers.  Denies any lower back pain or radiculopathy.    Activities of Daily Living:  Patient reports morning stiffness for 15 minutes.   Patient Denies nocturnal pain.  Difficulty dressing/grooming: Denies Difficulty climbing stairs: Denies Difficulty getting out of chair: Denies Difficulty using hands for taps, buttons, cutlery, and/or writing: Denies  Review of Systems  Constitutional:  Negative for fatigue.  HENT:  Negative for mouth sores and mouth dryness.   Eyes:  Positive for dryness.  Respiratory:  Negative for shortness of breath.   Cardiovascular:  Negative for chest pain and palpitations.  Gastrointestinal:  Negative for blood in stool, constipation and diarrhea.  Endocrine: Negative for increased urination.  Genitourinary:  Negative for involuntary urination.  Musculoskeletal:  Positive for joint pain, joint pain, joint swelling and morning stiffness. Negative for gait problem, myalgias, muscle weakness, muscle tenderness and myalgias.  Skin:  Negative for color change, rash, hair loss and sensitivity to sunlight.  Allergic/Immunologic: Negative for susceptible to infections.  Neurological:  Negative for dizziness and headaches.  Hematological:  Negative for swollen glands.  Psychiatric/Behavioral:  Negative for depressed mood and sleep disturbance. The patient is not  nervous/anxious.     PMFS History:  Patient Active Problem List   Diagnosis Date Noted   Paroxysmal atrial fibrillation (HCC) 12/17/2023   Venous stasis 04/10/2023   Globus sensation 03/29/2021   Dry mouth 03/29/2020   Seborrheic dermatitis of scalp 01/22/2020   Cervical radiculopathy 08/25/2019   History of esophagogastroduodenoscopy (EGD) 02/08/2018   Colon cancer screening 02/08/2018   Essential hypertension 12/25/2017   GERD (gastroesophageal reflux disease) 08/30/2017   Chronic diastolic (congestive) heart failure (HCC) 08/30/2017   History of atrial fibrillation 08/30/2017   Paresthesia of both hands 07/30/2017   Postsurgical hypothyroidism 07/30/2017   Aortic atherosclerosis (HCC) 04/28/2013   S/P cataract extraction 01/16/2013   Pseudophakia of both eyes 12/20/2012   Schatzki's ring 10/16/2012   Spinal stenosis of lumbar region 08/16/2012   Vaginal atrophy 11/14/2011   Graves' disease 05/05/2011   Disc herniation    Osteopenia    PERSONAL HISTORY OF ALLERGY TO LATEX 12/23/2009   Disease of pharynx 12/16/2008   Intermittent palpitations 11/10/2008   Hypothyroidism 09/22/2008   Hyperlipidemia 09/22/2008   Allergic rhinitis 09/22/2008    Past Medical History:  Diagnosis Date   Acid reflux    Aortic atherosclerosis 04/28/2013   CT 08/2017   Arthritis    joints, hands (08/30/2017)   Chronic lower back pain    Disc herniation    neck and low back   Family history of adverse reaction to anesthesia    daughter w/PONV   Follicular cystitis    Hiatal hernia    High cholesterol    Hyperlipidemia    ELEV CHOLESTEROL  Hypothyroidism    Osteopenia 2014   T score -1.6 FRAX 13%/1%   PAF (paroxysmal atrial fibrillation) (HCC) 08/30/2017   Echo 2/19: EF 55-60, normal wall motion, grade 1 diastolic dysfunction, mild PI, trivial TR //  Nuc 3/19: no ischemia or scar, EF 80 // CHADS2-VASc: 3 // Eliquis  5 bid    Schatzki's ring     Family History  Problem Relation Age  of Onset   Hypertension Mother    Heart attack Father 60   Heart disease Father    Anuerysm Father    Stroke Sister    Hypertension Sister    Arthritis Sister    Lupus Sister    Cancer Sister    Breast cancer Sister        Age 72   Rheum arthritis Sister    Stroke Sister    Cancer Brother 56       melanoma   Melanoma Brother    Prostate cancer Brother    Leukemia Brother    Pulmonary fibrosis Brother    Arthritis Daughter        Rheumatoid   Hyperparathyroidism Daughter    Past Surgical History:  Procedure Laterality Date   BREAST BIOPSY Bilateral 1997   excisional bx both benign   BREAST CYST ASPIRATION Right ~ 1993   CATARACT EXTRACTION W/ INTRAOCULAR LENS IMPLANT Left 2011   CATARACT EXTRACTION W/ INTRAOCULAR LENS IMPLANT Right 12/20/2012   Dr. Elenor Ferrier, OD   COLONOSCOPY WITH ESOPHAGOGASTRODUODENOSCOPY (EGD)  several   ? dilated esophagus (08/30/2017)   DILATION AND CURETTAGE OF UTERUS  1996   EYE MUSCLE SURGERY Bilateral 1992   HERNIA REPAIR     HYSTEROSCOPY     LAPAROSCOPIC CHOLECYSTECTOMY  09/28/2011   OOPHORECTOMY Right 04/2000   LAP RSO/LYSIS OF ADHESIONS   THYROIDECTOMY  1976   TUBAL LIGATION  1976   VARICOSE VEIN SURGERY Bilateral 1972   VENTRAL HERNIA REPAIR  06/23/13   Dr. Glendia Grieve   Social History   Tobacco Use   Smoking status: Former    Current packs/day: 0.00    Average packs/day: 0.1 packs/day for 18.0 years (1.8 ttl pk-yrs)    Types: Cigarettes    Start date: 01/26/1961    Quit date: 01/27/1979    Years since quitting: 45.1    Passive exposure: Past   Smokeless tobacco: Never  Vaping Use   Vaping status: Never Used  Substance Use Topics   Alcohol use: Yes    Alcohol/week: 1.0 standard drink of alcohol    Types: 1 Glasses of wine per week    Comment: 2 glasses of wine a month   Drug use: No   Social History   Social History Narrative   Tries to go the park to walk with her husband. Likes to take day trips, and yard work.      Immunization History  Administered Date(s) Administered   Influenza Split 06/02/2011, 05/03/2012   Influenza Whole 04/16/2009   Influenza,inj,Quad PF,6+ Mos 05/01/2013, 04/17/2014   Pneumococcal Conjugate-13 10/17/2013   Pneumococcal Polysaccharide-23 02/04/2016   Td 01/21/2010   Zoster, Live 08/18/2011     Objective: Vital Signs: BP 115/76   Pulse 78   Temp 97.6 F (36.4 C)   Resp 13   Ht 5' 5 (1.651 m)   Wt 179 lb 6.4 oz (81.4 kg)   LMP 09/08/2000   BMI 29.85 kg/m    Physical Exam Vitals and nursing note reviewed.  Constitutional:  Appearance: She is well-developed.  HENT:     Head: Normocephalic and atraumatic.  Eyes:     Conjunctiva/sclera: Conjunctivae normal.  Cardiovascular:     Rate and Rhythm: Normal rate and regular rhythm.     Heart sounds: Normal heart sounds.  Pulmonary:     Effort: Pulmonary effort is normal.     Breath sounds: Normal breath sounds.  Abdominal:     General: Bowel sounds are normal.     Palpations: Abdomen is soft.  Musculoskeletal:     Cervical back: Normal range of motion.  Lymphadenopathy:     Cervical: No cervical adenopathy.  Skin:    General: Skin is warm and dry.     Capillary Refill: Capillary refill takes less than 2 seconds.  Neurological:     Mental Status: She is alert and oriented to person, place, and time.  Psychiatric:        Behavior: Behavior normal.      Musculoskeletal Exam: He had limited range of motion of the cervical spine on lateral rotation.  Thoracic and lumbar spine were in good range of motion.  She had no tenderness over her lumbar spine.  There was no SI joint tenderness.  Shoulder joints, elbow joints, wrist joints, MCPs, PIPs and DIPs were in good range of motion with no synovitis.  She had subluxation of several of her DIP joints.  Hip joints and knee joints were in good range of motion without any warmth swelling or effusion.  There was no tenderness over ankles or MTPs.   CDAI  Exam: CDAI Score: -- Patient Global: --; Provider Global: -- Swollen: --; Tender: -- Joint Exam 03/18/2024   No joint exam has been documented for this visit   There is currently no information documented on the homunculus. Go to the Rheumatology activity and complete the homunculus joint exam.  Investigation: No additional findings.  Imaging: No results found.  Recent Labs: Lab Results  Component Value Date   WBC 5.6 10/09/2023   HGB 14.6 10/09/2023   PLT 203 10/09/2023   NA 139 10/09/2023   K 4.5 10/09/2023   CL 103 10/09/2023   CO2 22 10/09/2023   GLUCOSE 109 (H) 10/09/2023   BUN 12 10/09/2023   CREATININE 0.92 10/09/2023   BILITOT 0.7 10/09/2023   ALKPHOS 84 10/09/2023   AST 20 10/09/2023   ALT 16 10/09/2023   PROT 6.8 10/09/2023   ALBUMIN 4.4 10/09/2023   CALCIUM  9.2 10/09/2023   GFRAA 70 09/28/2020    Speciality Comments: No specialty comments available.  Procedures:  No procedures performed Allergies: Amoxicillin-pot clavulanate, Ceftriaxone sodium, Cephalexin, Codeine, Molds & smuts, and Zofran [ondansetron hcl]   Assessment / Plan:     Visit Diagnoses: Positive anti-CCP test - ANA 1:80 cytoplasmic, 1:40 NH, RF negative, positive family history of rheumatoid arthritis: Patient had no synovitis on the examination.  Primary osteoarthritis of both hands-she had bilateral CMC, PIP and DIP thickening without any inflammatory change.  She had subluxation of bilateral first PIP joints and bilateral first DIP joints.  Joint protection muscle strengthening was emphasized.  Pain in right hip -she had good range of motion of the right hip today without any discomfort.  X-rays of the hip joint obtained in the past were unremarkable.  Primary osteoarthritis of both knees-she good range of motion of bilateral knee joints without any warmth swelling or effusion.  Trigger finger, right middle finger - resolved.  Trigger finger, left ring finger - resolved.  DDD  (  degenerative disc disease), cervical-she continues to have limited range of motion of the cervical spine.  Range of motion exercises were emphasized.  Chronic right-sided low back pain with right-sided sciatica -she has intermittent discomfort in her lumbar spine.  X-ray showed severe levoscoliosis, multilevel spondylosis L4-L5 listhesis and facet joint arthropathy.  Osteopenia of multiple sites - DEXA 10/12/2021 : The BMD measured at Forearm Radius 33% is 0.682 g/cm2 with a T-scoreof -2.2.  Patient will get repeat DEXA scan with the PCP in 2025.  Other medical problems are listed as follows:  Chronic diastolic (congestive) heart failure (HCC)  History of hyperlipidemia  Paroxysmal atrial fibrillation (HCC)  Aortic atherosclerosis  Essential hypertension  History of gastroesophageal reflux (GERD)  Postsurgical hypothyroidism  Family history of rheumatoid arthritis-in her daughter.  Former smoker  Orders: No orders of the defined types were placed in this encounter.  No orders of the defined types were placed in this encounter.   Face-to-face time spent with patient was first DIP joints.  Joint protection muscle strengthening was emphasized.  Minutes. Greater than 50% of time was spent in counseling and coordination of care.  Follow-Up Instructions: Return in about 1 year (around 03/18/2025) for Osteoarthritis.   Maya Nash, MD  Note - This record has been created using Animal nutritionist.  Chart creation errors have been sought, but may not always  have been located. Such creation errors do not reflect on  the standard of medical care.

## 2024-03-18 ENCOUNTER — Ambulatory Visit: Payer: Medicare Other | Attending: Rheumatology | Admitting: Rheumatology

## 2024-03-18 ENCOUNTER — Encounter: Payer: Self-pay | Admitting: Rheumatology

## 2024-03-18 VITALS — BP 115/76 | HR 78 | Temp 97.6°F | Resp 13 | Ht 65.0 in | Wt 179.4 lb

## 2024-03-18 DIAGNOSIS — Z8639 Personal history of other endocrine, nutritional and metabolic disease: Secondary | ICD-10-CM | POA: Insufficient documentation

## 2024-03-18 DIAGNOSIS — I700 Atherosclerosis of aorta: Secondary | ICD-10-CM | POA: Diagnosis not present

## 2024-03-18 DIAGNOSIS — M8589 Other specified disorders of bone density and structure, multiple sites: Secondary | ICD-10-CM | POA: Insufficient documentation

## 2024-03-18 DIAGNOSIS — M170 Bilateral primary osteoarthritis of knee: Secondary | ICD-10-CM | POA: Insufficient documentation

## 2024-03-18 DIAGNOSIS — Z87891 Personal history of nicotine dependence: Secondary | ICD-10-CM | POA: Diagnosis not present

## 2024-03-18 DIAGNOSIS — Z8719 Personal history of other diseases of the digestive system: Secondary | ICD-10-CM | POA: Diagnosis not present

## 2024-03-18 DIAGNOSIS — M19041 Primary osteoarthritis, right hand: Secondary | ICD-10-CM | POA: Diagnosis not present

## 2024-03-18 DIAGNOSIS — I10 Essential (primary) hypertension: Secondary | ICD-10-CM | POA: Insufficient documentation

## 2024-03-18 DIAGNOSIS — M19042 Primary osteoarthritis, left hand: Secondary | ICD-10-CM | POA: Insufficient documentation

## 2024-03-18 DIAGNOSIS — I5032 Chronic diastolic (congestive) heart failure: Secondary | ICD-10-CM | POA: Insufficient documentation

## 2024-03-18 DIAGNOSIS — M65331 Trigger finger, right middle finger: Secondary | ICD-10-CM

## 2024-03-18 DIAGNOSIS — M25551 Pain in right hip: Secondary | ICD-10-CM

## 2024-03-18 DIAGNOSIS — M5441 Lumbago with sciatica, right side: Secondary | ICD-10-CM | POA: Insufficient documentation

## 2024-03-18 DIAGNOSIS — Z8261 Family history of arthritis: Secondary | ICD-10-CM | POA: Insufficient documentation

## 2024-03-18 DIAGNOSIS — M65342 Trigger finger, left ring finger: Secondary | ICD-10-CM

## 2024-03-18 DIAGNOSIS — R768 Other specified abnormal immunological findings in serum: Secondary | ICD-10-CM | POA: Insufficient documentation

## 2024-03-18 DIAGNOSIS — I480 Paroxysmal atrial fibrillation: Secondary | ICD-10-CM | POA: Diagnosis not present

## 2024-03-18 DIAGNOSIS — M5030 Other cervical disc degeneration, unspecified cervical region: Secondary | ICD-10-CM | POA: Diagnosis not present

## 2024-03-18 DIAGNOSIS — G8929 Other chronic pain: Secondary | ICD-10-CM | POA: Diagnosis not present

## 2024-03-18 DIAGNOSIS — E890 Postprocedural hypothyroidism: Secondary | ICD-10-CM | POA: Insufficient documentation

## 2024-03-21 ENCOUNTER — Other Ambulatory Visit: Payer: Self-pay | Admitting: Family Medicine

## 2024-03-23 ENCOUNTER — Other Ambulatory Visit: Payer: Self-pay | Admitting: Family Medicine

## 2024-03-23 DIAGNOSIS — E039 Hypothyroidism, unspecified: Secondary | ICD-10-CM

## 2024-04-02 ENCOUNTER — Ambulatory Visit

## 2024-04-07 ENCOUNTER — Ambulatory Visit
Admission: RE | Admit: 2024-04-07 | Discharge: 2024-04-07 | Disposition: A | Source: Ambulatory Visit | Attending: Family Medicine | Admitting: Family Medicine

## 2024-04-07 DIAGNOSIS — Z1231 Encounter for screening mammogram for malignant neoplasm of breast: Secondary | ICD-10-CM | POA: Diagnosis not present

## 2024-04-09 ENCOUNTER — Ambulatory Visit (INDEPENDENT_AMBULATORY_CARE_PROVIDER_SITE_OTHER): Admitting: Family Medicine

## 2024-04-09 ENCOUNTER — Encounter: Payer: Self-pay | Admitting: Family Medicine

## 2024-04-09 VITALS — BP 116/54 | HR 75 | Ht 65.0 in | Wt 178.0 lb

## 2024-04-09 DIAGNOSIS — E785 Hyperlipidemia, unspecified: Secondary | ICD-10-CM | POA: Diagnosis not present

## 2024-04-09 DIAGNOSIS — E039 Hypothyroidism, unspecified: Secondary | ICD-10-CM | POA: Diagnosis not present

## 2024-04-09 DIAGNOSIS — I10 Essential (primary) hypertension: Secondary | ICD-10-CM

## 2024-04-09 DIAGNOSIS — N952 Postmenopausal atrophic vaginitis: Secondary | ICD-10-CM

## 2024-04-09 MED ORDER — ESTRADIOL 0.01 % VA CREA: 1.0000 | TOPICAL_CREAM | VAGINAL | 12 refills | Status: AC

## 2024-04-09 MED ORDER — CLOBETASOL PROPIONATE 0.05 % EX SOLN: 1.0000 | Freq: Two times a day (BID) | CUTANEOUS | 3 refills | Status: AC | PRN

## 2024-04-09 NOTE — Patient Instructions (Signed)
 Try to get your shingles and tetanus shot at the local pharmacy, should be free with Medicare

## 2024-04-09 NOTE — Assessment & Plan Note (Signed)
 BP at goal and due for updated labs.

## 2024-04-09 NOTE — Progress Notes (Signed)
 Established Patient Office Visit  Subjective  Patient ID: Anne [name], female    DOB: Nov 23, 1950  Age: 73 y.o. MRN: [id number]  Chief Complaint  Patient presents with   Hypertension    HPI  Discussed the use of AI scribe software for clinical note transcription with the patient, who gave verbal consent to proceed.  History of Present Illness Anne [name] is a 73 year old female who presents for a routine follow-up. She is accompanied by her daughter today.   General health status - Feels generally well - No major health issues - No recent chest pain or shortness of breath  Musculoskeletal pain and mobility - Experiences joint pain - Manages chronic sciatica and back and leg pain by avoiding heavy lifting and performing daily stretches learned in physical therapy - Finds stretching beneficial for maintaining mobility and managing pain - No recent worsening of musculoskeletal symptoms  Thyroid  dysfunction - Long-standing thyroid  disorder since age 8 - No recent changes in skin, hair, or energy levels - Takes thyroid  medication with water in the morning - No new symptoms related to thyroid  condition  Weight management - Recent minor weight loss followed by slight regain - Maintains weight under a self-imposed threshold to help manage sciatica and back pain - Mindful of weight fluctuations  Dermatologic and genitourinary symptoms - Uses clobetasol  liquid solution - Applies Estrace  a couple of times per week for vaginal dryness or pruritus, would like a new rx even though we haven't written for this before.   Immunization status - Received first dose of shingles vaccine - Has not yet completed the two-dose shingles vaccine series      ROS    Objective:     BP (!) 116/54   Pulse 75   Ht 5' 5 (1.651 m)   Wt 178 lb 0.6 oz (80.8 kg)   LMP 09/08/2000   SpO2 100%   BMI 29.63 kg/m    Physical Exam Vitals and nursing note reviewed.   Constitutional:      Appearance: Normal appearance.  HENT:     Head: Normocephalic and atraumatic.  Eyes:     Conjunctiva/sclera: Conjunctivae normal.  Cardiovascular:     Rate and Rhythm: Normal rate and regular rhythm.  Pulmonary:     Effort: Pulmonary effort is normal.     Breath sounds: Normal breath sounds.  Skin:    General: Skin is warm and dry.  Neurological:     Mental Status: She is alert.  Psychiatric:        Mood and Affect: Mood normal.      No results found for any visits on 04/09/24.    The 10-year ASCVD risk score (Arnett DK, et al., 2019) is: 17.4%    Assessment & Plan:   Problem List Items Addressed This Visit       Cardiovascular and Mediastinum   Essential hypertension   BP at goal and due for updated labs.       Relevant Orders   TSH   CBC   CMP14+EGFR     Endocrine   Hypothyroidism - Primary   Relevant Orders   TSH   CBC   CMP14+EGFR     Genitourinary   Vaginal atrophy   Relevant Medications   estradiol  (ESTRACE ) 0.01 % CREA vaginal cream     Other   Hyperlipidemia   Relevant Orders   TSH   CBC   CMP14+EGFR   Lipid panel   Assessment and Plan  Assessment & Plan Hypothyroidism Well-managed with stable symptoms.  Essential hypertension Blood pressure well-controlled.  Chronic low back and leg pain (sciatica) Managed with physical therapy exercises, no recent exacerbations. - Continue current physical therapy exercises. - Contact provider if symptoms worsen or if additional physical therapy sessions are needed.  Menopausal symptoms (vaginal dryness) Managed with Estrace , no new symptoms. - Prescribe Estrace  for vaginal dryness.  General Health Maintenance Discussed flu vaccine and shingles vaccine series. - Encourage receiving the flu vaccine before Thanksgiving. - Advise to complete the shingles vaccine series at the pharmacy.   Return in about 6 months (around 10/08/2024) for Hypertension, Thyroid  check up .     Anne Byars, MD

## 2024-04-10 ENCOUNTER — Ambulatory Visit: Payer: Self-pay | Admitting: Family Medicine

## 2024-04-10 LAB — CBC
Hematocrit: 41.7 % (ref 34.0–46.6)
Hemoglobin: 13.9 g/dL (ref 11.1–15.9)
MCH: 28.8 pg (ref 26.6–33.0)
MCHC: 33.3 g/dL (ref 31.5–35.7)
MCV: 86 fL (ref 79–97)
Platelets: 209 x10E3/uL (ref 150–450)
RBC: 4.83 x10E6/uL (ref 3.77–5.28)
RDW: 13.7 % (ref 11.7–15.4)
WBC: 5.9 x10E3/uL (ref 3.4–10.8)

## 2024-04-10 LAB — CMP14+EGFR
ALT: 13 IU/L (ref 0–32)
AST: 18 IU/L (ref 0–40)
Albumin: 4.3 g/dL (ref 3.8–4.8)
Alkaline Phosphatase: 81 IU/L (ref 49–135)
BUN/Creatinine Ratio: 11 — ABNORMAL LOW (ref 12–28)
BUN: 11 mg/dL (ref 8–27)
Bilirubin Total: 0.7 mg/dL (ref 0.0–1.2)
CO2: 23 mmol/L (ref 20–29)
Calcium: 9.4 mg/dL (ref 8.7–10.3)
Chloride: 100 mmol/L (ref 96–106)
Creatinine, Ser: 0.96 mg/dL (ref 0.57–1.00)
Globulin, Total: 2.4 g/dL (ref 1.5–4.5)
Glucose: 121 mg/dL — ABNORMAL HIGH (ref 70–99)
Potassium: 4.5 mmol/L (ref 3.5–5.2)
Sodium: 138 mmol/L (ref 134–144)
Total Protein: 6.7 g/dL (ref 6.0–8.5)
eGFR: 62 mL/min/1.73 (ref >59)

## 2024-04-10 LAB — LIPID PANEL
Chol/HDL Ratio: 4 ratio (ref 0.0–4.4)
Cholesterol, Total: 181 mg/dL (ref 100–199)
HDL: 45 mg/dL (ref >39)
LDL Chol Calc (NIH): 113 mg/dL — ABNORMAL HIGH (ref 0–99)
Triglycerides: 131 mg/dL (ref 0–149)
VLDL Cholesterol Cal: 23 mg/dL (ref 5–40)

## 2024-04-10 LAB — TSH: TSH: 3.78 u[IU]/mL (ref 0.450–4.500)

## 2024-04-10 NOTE — Progress Notes (Signed)
 Please call patient. Normal mammogram.  Repeat in 1 year.

## 2024-04-10 NOTE — Progress Notes (Signed)
 Hi Aolanis, your LDL slightly elevated at 113.  Continue to work on healthy Mediterranean diet and regular exercise.  Metabolic panel including kidney and liver function looks great.  Thyroid  is normal.  Blood count looks good.

## 2024-04-11 NOTE — Telephone Encounter (Signed)
 Copied from CRM [id number]. Topic: Clinical - Lab/Test Results >> Apr 11, 2024 11:41 AM Amy B wrote: Reason for CRM: Patient returned call.  Provided results of mammogram

## 2024-05-27 ENCOUNTER — Other Ambulatory Visit: Payer: Self-pay | Admitting: Family Medicine

## 2024-05-27 DIAGNOSIS — Z1231 Encounter for screening mammogram for malignant neoplasm of breast: Secondary | ICD-10-CM

## 2024-05-29 ENCOUNTER — Other Ambulatory Visit: Payer: Self-pay | Admitting: Family Medicine

## 2024-09-04 ENCOUNTER — Ambulatory Visit

## 2024-10-08 ENCOUNTER — Ambulatory Visit: Admitting: Family Medicine

## 2025-03-18 ENCOUNTER — Ambulatory Visit: Admitting: Rheumatology

## 2025-04-08 ENCOUNTER — Ambulatory Visit
